# Patient Record
Sex: Male | Born: 1960 | Race: Black or African American | Hispanic: No | Marital: Single | Smoking: Current every day smoker
Health system: Southern US, Community
[De-identification: ages and names within clinical notes are randomized; demographics above are authoritative.]

## PROBLEM LIST (undated history)

## (undated) DIAGNOSIS — B20 Human immunodeficiency virus [HIV] disease: Secondary | ICD-10-CM

## (undated) DIAGNOSIS — B1920 Unspecified viral hepatitis C without hepatic coma: Secondary | ICD-10-CM

## (undated) DIAGNOSIS — E119 Type 2 diabetes mellitus without complications: Secondary | ICD-10-CM

## (undated) DIAGNOSIS — F209 Schizophrenia, unspecified: Secondary | ICD-10-CM

## (undated) DIAGNOSIS — F1910 Other psychoactive substance abuse, uncomplicated: Secondary | ICD-10-CM

## (undated) DIAGNOSIS — F200 Paranoid schizophrenia: Secondary | ICD-10-CM

## (undated) HISTORY — DX: Schizophrenia, unspecified: F20.9

## (undated) HISTORY — DX: Other psychoactive substance abuse, uncomplicated: F19.10

## (undated) HISTORY — DX: Human immunodeficiency virus (HIV) disease: B20

## (undated) HISTORY — DX: Unspecified viral hepatitis C without hepatic coma: B19.20

## (undated) HISTORY — DX: Type 2 diabetes mellitus without complications: E11.9

## (undated) MED ORDER — QUETIAPINE FUMARATE 400 MG OR TABS
ORAL_TABLET | ORAL | Status: AC
Start: 2015-10-29 — End: ?

## (undated) DEATH — deceased

---

## 2012-07-22 ENCOUNTER — Emergency Department (HOSPITAL_BASED_OUTPATIENT_CLINIC_OR_DEPARTMENT_OTHER)
Admission: EM | Admit: 2012-07-22 | Discharge: 2012-07-22 | Disposition: A | Discharge status: Home / Self Care | Payer: Medicaid Other | Attending: Nurse Practitioner | Admitting: Nurse Practitioner

## 2012-07-22 DIAGNOSIS — R21 Rash and other nonspecific skin eruption: Secondary | ICD-10-CM | POA: Insufficient documentation

## 2012-07-22 DIAGNOSIS — L299 Pruritus, unspecified: Secondary | ICD-10-CM | POA: Insufficient documentation

## 2012-08-03 ENCOUNTER — Other Ambulatory Visit (EMERGENCY_DEPARTMENT_HOSPITAL): Payer: Self-pay

## 2012-08-03 ENCOUNTER — Other Ambulatory Visit (HOSPITAL_COMMUNITY): Payer: Self-pay | Admitting: Internal Medicine

## 2012-08-03 ENCOUNTER — Other Ambulatory Visit (HOSPITAL_COMMUNITY): Payer: Self-pay

## 2012-08-03 ENCOUNTER — Other Ambulatory Visit (EMERGENCY_DEPARTMENT_HOSPITAL): Payer: Self-pay | Admitting: Internal Medicine

## 2012-08-03 ENCOUNTER — Inpatient Hospital Stay (HOSPITAL_BASED_OUTPATIENT_CLINIC_OR_DEPARTMENT_OTHER)
Admission: EM | Admit: 2012-08-03 | Discharge: 2012-08-06 | DRG: 714 | Disposition: A | Discharge status: Home / Self Care | Payer: No Typology Code available for payment source | Attending: Internal Medicine | Admitting: Internal Medicine

## 2012-08-03 ENCOUNTER — Inpatient Hospital Stay (HOSPITAL_COMMUNITY): Payer: No Typology Code available for payment source | Admitting: Internal Medicine

## 2012-08-03 DIAGNOSIS — J189 Pneumonia, unspecified organism: Principal | ICD-10-CM | POA: Diagnosis present

## 2012-08-03 DIAGNOSIS — E876 Hypokalemia: Secondary | ICD-10-CM | POA: Diagnosis present

## 2012-08-03 DIAGNOSIS — F209 Schizophrenia, unspecified: Secondary | ICD-10-CM | POA: Diagnosis present

## 2012-08-03 DIAGNOSIS — B370 Candidal stomatitis: Secondary | ICD-10-CM | POA: Diagnosis present

## 2012-08-03 DIAGNOSIS — B20 Human immunodeficiency virus [HIV] disease: Secondary | ICD-10-CM | POA: Diagnosis present

## 2012-08-03 DIAGNOSIS — B1920 Unspecified viral hepatitis C without hepatic coma: Secondary | ICD-10-CM | POA: Diagnosis present

## 2012-08-03 DIAGNOSIS — F17200 Nicotine dependence, unspecified, uncomplicated: Secondary | ICD-10-CM | POA: Diagnosis present

## 2012-08-03 DIAGNOSIS — Z5900 Homelessness unspecified: Secondary | ICD-10-CM

## 2012-08-03 LAB — LAB ADD ON ORDER

## 2012-08-03 LAB — CBC, DIFF
% Basophils: 0 %
% Eosinophils: 0 %
% Immature Granulocytes: 0 %
% Lymphocytes: 23 %
% Monocytes: 8 %
% Neutrophils: 69 %
Absolute Eosinophil Count: 0.01 10*3/uL (ref 0.00–0.50)
Absolute Lymphocyte Count: 1.91 10*3/uL (ref 1.00–4.80)
Basophils: 0.02 10*3/uL (ref 0.00–0.20)
Hematocrit: 34 % — ABNORMAL LOW (ref 38–50)
Hemoglobin: 11.5 g/dL — ABNORMAL LOW (ref 13.0–18.0)
Immature Granulocytes: 0.03 10*3/uL (ref 0.00–0.05)
MCH: 29.6 pg (ref 27.3–33.6)
MCHC: 34 g/dL (ref 32.2–36.5)
MCV: 87 fL (ref 81–98)
Monocytes: 0.64 10*3/uL (ref 0.00–0.80)
Neutrophils: 5.87 10*3/uL (ref 1.80–7.00)
Platelet Count: 179 10*3/uL (ref 150–400)
RBC: 3.88 mil/uL — ABNORMAL LOW (ref 4.40–5.60)
RDW-CV: 13.3 % (ref 11.6–14.4)
WBC: 8.48 10*3/uL (ref 4.30–10.00)

## 2012-08-03 LAB — HEPATIC FUNCTION PANEL
ALT (GPT): 20 U/L (ref 10–64)
AST (GOT): 29 U/L (ref 15–40)
Albumin: 2.4 g/dL — ABNORMAL LOW (ref 3.5–5.2)
Alkaline Phosphatase (Total): 63 U/L (ref 39–139)
Bilirubin (Direct): 0.2 mg/dL (ref 0.0–0.3)
Bilirubin (Total): 0.8 mg/dL (ref 0.2–1.3)
Protein (Total): 7.6 g/dL (ref 6.0–8.2)

## 2012-08-03 LAB — T CELL SUBSETS CD4/CD8 ONLY COUNTS
% CD4: 16 % — ABNORMAL LOW (ref 33–61)
% CD8: 66 % — ABNORMAL HIGH (ref 14–35)
Abs CD4 Lymphocyte Cnt: 0.251 10*3/uL — ABNORMAL LOW (ref 0.733–2.25)
Abs CD8 Lymphocyte Cnt: 1.046 10*3/uL (ref 0.25–1.24)
CD4/CD8 Ratio: 0.24

## 2012-08-03 LAB — MAGNESIUM: Magnesium: 1.7 mg/dL — ABNORMAL LOW (ref 1.8–2.4)

## 2012-08-03 LAB — BASIC METABOLIC PANEL
Anion Gap: 6 (ref 3–11)
Calcium: 8.2 mg/dL — ABNORMAL LOW (ref 8.9–10.2)
Carbon Dioxide, Total: 28 mEq/L (ref 22–32)
Chloride: 99 mEq/L (ref 98–108)
Creatinine: 0.73 mg/dL (ref 0.51–1.18)
GFR, Calc, African American: >60 mL/min (ref >59)
GFR, Calc, European American: >60 mL/min (ref >59)
Glucose: 92 mg/dL (ref 62–125)
Potassium: 3.1 mEq/L — ABNORMAL LOW (ref 3.7–5.2)
Sodium: 133 mEq/L — ABNORMAL LOW (ref 136–145)
Urea Nitrogen: 7 mg/dL — ABNORMAL LOW (ref 8–21)

## 2012-08-03 LAB — INFLUENZA A/B AND RSV BY PCR
2009 Swine H1N1 Influenza A: NOT DETECTED
Influenza A PCR Result: NOT DETECTED
Influenza B PCR Result: NOT DETECTED
RSV PCR Result: NOT DETECTED
Seasonal H3N2 Influenza A: NOT DETECTED

## 2012-08-03 LAB — PHOSPHATE: Phosphate: 2.1 mg/dL — ABNORMAL LOW (ref 2.5–4.5)

## 2012-08-03 LAB — ROUTINE INFLUENZA A/B & RSV

## 2012-08-03 LAB — 1ST EXTRA BLUE TOP

## 2012-08-03 LAB — L LACTATE, VENOUS WB (TO U/H LAB WITHIN 30 MIN): L Lactate (Direct), Venous Whole Blood: 1.1 mmol/L (ref 0.6–2.5)

## 2012-08-04 ENCOUNTER — Other Ambulatory Visit (HOSPITAL_COMMUNITY): Payer: Self-pay | Admitting: Internal Medicine

## 2012-08-04 LAB — PNEUMOCYSTIS STAIN: Direct Exam: NEGATIVE

## 2012-08-04 LAB — CBC (HEMOGRAM)
Hematocrit: 34 % — ABNORMAL LOW (ref 38–50)
Hemoglobin: 11.6 g/dL — ABNORMAL LOW (ref 13.0–18.0)
MCH: 29.8 pg (ref 27.3–33.6)
MCHC: 34.4 g/dL (ref 32.2–36.5)
MCV: 87 fL (ref 81–98)
Platelet Count: 205 10*3/uL (ref 150–400)
RBC: 3.89 mil/uL — ABNORMAL LOW (ref 4.40–5.60)
RDW-CV: 13.3 % (ref 11.6–14.4)
WBC: 4.71 10*3/uL (ref 4.30–10.00)

## 2012-08-04 LAB — MAGNESIUM: Magnesium: 1.8 mg/dL (ref 1.8–2.4)

## 2012-08-04 LAB — STREP. PNEUMONIAE AG, URN
Strep Pneumoniae Ag Commnt,URN: NEGATIVE
Strep Pneumoniae Ag Result,URN: NEGATIVE

## 2012-08-04 LAB — 1ST EXTRA MISC SPECIMEN

## 2012-08-05 LAB — RESPIRATORY VIRUS FA BTY: RSV FA: NEGATIVE — AB

## 2012-08-06 LAB — HEPATITIS A,B,& C PANEL
Hepatitis A Ab: REACTIVE
Hepatitis B Core Ab: REACTIVE — AB
Hepatitis B Surf Antibody Intl Units: 10.1 IU
Hepatitis B Surface Ab: REACTIVE
Hepatitis B Surface Antigen w/Reflex: NONREACTIVE
Hepatitis C Antibody w/Rflx PCR: REACTIVE — AB

## 2012-08-07 LAB — HIV1 RNA QUANTITATION
HIV RNA Copies/mL (Log10): 4.49
HIV RNA Result: 31200 Copies/mL — AB

## 2012-08-07 LAB — LOWER RESP C/S W/GRAM: Culture: NORMAL

## 2012-08-08 LAB — BLOOD C/S
Culture: NO GROWTH
Culture: NO GROWTH
Culture: NO GROWTH
Culture: NO GROWTH

## 2012-08-09 LAB — AMPLIFIED M. TUBERC. DIRECT: MTB Complex RRNA: NEGATIVE

## 2012-08-10 LAB — HEPATITIS C RNA, QUANT
Hcv RNA Iu/Ml (Log 10): 6
Hepatitis C Quant Result: 962400 IU/mL — AB

## 2012-08-15 LAB — HIV GENOTYPIC RESISTANCE ASSAY
Abacavir (Ziagen) ABC: 0
Atazanavir (Reyataz) ATV: 0
Darunavir (Prezista) DRV: 0
Didanosine (Videx) Ddi: 0
Efavirenz (Sustiva) EFV: 0
Emtricitabine (Emtriva) FTC: 0
Etravirine (Intelence) ETR: 0
Fosamprenavir (Lexiva) Fpv: 0
HIVGR, Protease Amino Acids: 2
HIVGR, RT: Amino Acids: 1
Indinavir (Crixivan) Idv: 0
Lamivudine (Epivir) 3TC: 0
Lopinavir-ritonavir (Kaletra) LPV: 0
Nelfinavir (Viracept) Nfv: 0
Nevirapine (Virammune) NVP: 0
Rilpivirine (Edurant) RPV: 0
Saquinavir (Invirase) Sqv: 0
Stavudine (Zerit) D4T: 0
Tenofovir (Viread) TDF/TAF: 0
Tipranavir (Aptivus) Tpv: 0
Zidovudine (Retrovir) ZDV/AZT: 0

## 2012-08-21 ENCOUNTER — Ambulatory Visit (HOSPITAL_BASED_OUTPATIENT_CLINIC_OR_DEPARTMENT_OTHER): Payer: No Typology Code available for payment source | Attending: Infectious Disease | Admitting: Infectious Disease

## 2012-08-21 DIAGNOSIS — B1920 Unspecified viral hepatitis C without hepatic coma: Secondary | ICD-10-CM | POA: Insufficient documentation

## 2012-08-21 DIAGNOSIS — F209 Schizophrenia, unspecified: Secondary | ICD-10-CM | POA: Insufficient documentation

## 2012-08-21 DIAGNOSIS — B20 Human immunodeficiency virus [HIV] disease: Secondary | ICD-10-CM | POA: Insufficient documentation

## 2012-09-01 LAB — LOWER RESP FUNGAL W/DIR. EXAM: Stain For Fungus: NONE SEEN

## 2012-09-03 ENCOUNTER — Ambulatory Visit: Payer: No Typology Code available for payment source

## 2012-09-11 ENCOUNTER — Ambulatory Visit: Payer: No Typology Code available for payment source

## 2012-09-12 ENCOUNTER — Other Ambulatory Visit (HOSPITAL_COMMUNITY): Payer: Self-pay | Admitting: Physician Assistant

## 2012-09-12 ENCOUNTER — Inpatient Hospital Stay (HOSPITAL_COMMUNITY): Payer: No Typology Code available for payment source | Admitting: Internal Medicine

## 2012-09-12 ENCOUNTER — Other Ambulatory Visit (EMERGENCY_DEPARTMENT_HOSPITAL): Payer: Self-pay | Admitting: Physician Assistant

## 2012-09-12 ENCOUNTER — Other Ambulatory Visit (EMERGENCY_DEPARTMENT_HOSPITAL): Payer: Self-pay

## 2012-09-12 ENCOUNTER — Inpatient Hospital Stay (HOSPITAL_BASED_OUTPATIENT_CLINIC_OR_DEPARTMENT_OTHER)
Admission: EM | Admit: 2012-09-12 | Discharge: 2012-09-15 | DRG: 714 | Disposition: A | Discharge status: Home / Self Care | Payer: No Typology Code available for payment source | Attending: Internal Medicine | Admitting: Internal Medicine

## 2012-09-12 DIAGNOSIS — E871 Hypo-osmolality and hyponatremia: Secondary | ICD-10-CM | POA: Diagnosis present

## 2012-09-12 DIAGNOSIS — Z5900 Homelessness unspecified: Secondary | ICD-10-CM

## 2012-09-12 DIAGNOSIS — F209 Schizophrenia, unspecified: Secondary | ICD-10-CM | POA: Diagnosis present

## 2012-09-12 DIAGNOSIS — D638 Anemia in other chronic diseases classified elsewhere: Secondary | ICD-10-CM | POA: Diagnosis present

## 2012-09-12 DIAGNOSIS — IMO0002 Reserved for concepts with insufficient information to code with codable children: Secondary | ICD-10-CM

## 2012-09-12 DIAGNOSIS — J189 Pneumonia, unspecified organism: Principal | ICD-10-CM | POA: Diagnosis present

## 2012-09-12 DIAGNOSIS — R197 Diarrhea, unspecified: Secondary | ICD-10-CM | POA: Diagnosis present

## 2012-09-12 DIAGNOSIS — B182 Chronic viral hepatitis C: Secondary | ICD-10-CM | POA: Diagnosis present

## 2012-09-12 DIAGNOSIS — B20 Human immunodeficiency virus [HIV] disease: Secondary | ICD-10-CM | POA: Diagnosis present

## 2012-09-12 DIAGNOSIS — F17200 Nicotine dependence, unspecified, uncomplicated: Secondary | ICD-10-CM | POA: Diagnosis present

## 2012-09-12 DIAGNOSIS — Z66 Do not resuscitate: Secondary | ICD-10-CM | POA: Diagnosis present

## 2012-09-12 LAB — CBC, DIFF
% Basophils: 1 %
% Eosinophils: 1 %
% Immature Granulocytes: 1 %
% Lymphocytes: 30 %
% Monocytes: 17 %
% Neutrophils: 50 %
Absolute Eosinophil Count: 0.08 10*3/uL (ref 0.00–0.50)
Absolute Lymphocyte Count: 1.68 10*3/uL (ref 1.00–4.80)
Basophils: 0.03 10*3/uL (ref 0.00–0.20)
Hematocrit: 33 % — ABNORMAL LOW (ref 38–50)
Hemoglobin: 11.3 g/dL — ABNORMAL LOW (ref 13.0–18.0)
Immature Granulocytes: 0.03 10*3/uL (ref 0.00–0.05)
MCH: 29.7 pg (ref 27.3–33.6)
MCHC: 34.5 g/dL (ref 32.2–36.5)
MCV: 86 fL (ref 81–98)
Monocytes: 0.93 10*3/uL — ABNORMAL HIGH (ref 0.00–0.80)
Neutrophils: 2.85 10*3/uL (ref 1.80–7.00)
Platelet Count: 235 10*3/uL (ref 150–400)
RBC: 3.81 mil/uL — ABNORMAL LOW (ref 4.40–5.60)
RDW-CV: 14.7 % — ABNORMAL HIGH (ref 11.6–14.4)
WBC: 5.6 10*3/uL (ref 4.30–10.00)

## 2012-09-12 LAB — INFLUENZA A/B AND RSV BY PCR
2009 Swine H1N1 Influenza A: NOT DETECTED
Influenza A PCR Result: NOT DETECTED
Influenza B PCR Result: NOT DETECTED
RSV PCR Result: NOT DETECTED
Seasonal H3N2 Influenza A: NOT DETECTED

## 2012-09-12 LAB — ROUTINE INFLUENZA A/B & RSV

## 2012-09-12 LAB — BASIC METABOLIC PANEL
Anion Gap: 9 (ref 3–11)
Calcium: 8.6 mg/dL — ABNORMAL LOW (ref 8.9–10.2)
Carbon Dioxide, Total: 24 mEq/L (ref 22–32)
Chloride: 97 mEq/L — ABNORMAL LOW (ref 98–108)
Creatinine: 0.87 mg/dL (ref 0.51–1.18)
GFR, Calc, African American: >60 mL/min (ref >59)
GFR, Calc, European American: >60 mL/min (ref >59)
Glucose: 85 mg/dL (ref 62–125)
Potassium: 3.8 mEq/L (ref 3.7–5.2)
Sodium: 130 mEq/L — ABNORMAL LOW (ref 136–145)
Urea Nitrogen: 10 mg/dL (ref 8–21)

## 2012-09-12 LAB — LAB ADD ON ORDER

## 2012-09-12 LAB — LACTATE DEHYDROGENASE: Lactate Dehydrogenase: 171 U/L (ref 80–190)

## 2012-09-12 LAB — 1ST EXTRA BLUE TOP

## 2012-09-12 LAB — L LACTATE, VENOUS WB (TO U/H LAB WITHIN 30 MIN): L Lactate (Direct), Venous Whole Blood: 0.9 mmol/L (ref 0.6–2.5)

## 2012-09-13 ENCOUNTER — Other Ambulatory Visit (HOSPITAL_COMMUNITY): Payer: Self-pay | Admitting: Neuromuscular Medicine

## 2012-09-13 LAB — R/O MRSA

## 2012-09-13 LAB — BASIC METABOLIC PANEL
Anion Gap: 9 (ref 3–11)
Calcium: 8.7 mg/dL — ABNORMAL LOW (ref 8.9–10.2)
Carbon Dioxide, Total: 25 mEq/L (ref 22–32)
Chloride: 104 mEq/L (ref 98–108)
Creatinine: 0.59 mg/dL (ref 0.51–1.18)
GFR, Calc, African American: >60 mL/min (ref >59)
GFR, Calc, European American: >60 mL/min (ref >59)
Glucose: 97 mg/dL (ref 62–125)
Potassium: 3.9 mEq/L (ref 3.7–5.2)
Sodium: 138 mEq/L (ref 136–145)
Urea Nitrogen: 3 mg/dL — ABNORMAL LOW (ref 8–21)

## 2012-09-13 LAB — CBC (HEMOGRAM)
Hematocrit: 35 % — ABNORMAL LOW (ref 38–50)
Hemoglobin: 11.9 g/dL — ABNORMAL LOW (ref 13.0–18.0)
MCH: 29.7 pg (ref 27.3–33.6)
MCHC: 34.4 g/dL (ref 32.2–36.5)
MCV: 86 fL (ref 81–98)
Platelet Count: 276 10*3/uL (ref 150–400)
RBC: 4.01 mil/uL — ABNORMAL LOW (ref 4.40–5.60)
RDW-CV: 14.7 % — ABNORMAL HIGH (ref 11.6–14.4)
WBC: 4.31 10*3/uL (ref 4.30–10.00)

## 2012-09-13 LAB — C. DIFFICILE TOXIN GENE BY PCR: C. difficile Toxin Gene by PCR: NEGATIVE

## 2012-09-13 NOTE — Progress Notes (Signed)
09/11/2012    Subjective Findings: Pt comes in stating that he has increased sinus congestion increased SOB had PNA last month cough, slept DESC PCP [facility]    Objective Findings: BP 109/67  Pulse 115  Temp(Src) 102 F (38.9 C) (Temporal)  Resp 20  SpO2 97%    A&O x 3 skin warm and dry color WNL  TM's WNL some wax present do c/o of pain or drainage noted, throat WNL lungs CTA.     Assessment potential for increased URI    Plan: pt not wanting to return to ER at this time Provided with Tylenol denied day rest. Requested he return about four for follow up on temp. Pt states agreement.     Follow up 1615  Temp 37.2 agrees to return to clinic or go to [facility] in AM.         Allean Found, RN

## 2012-09-14 ENCOUNTER — Other Ambulatory Visit (HOSPITAL_COMMUNITY): Payer: Self-pay | Admitting: Neuromuscular Medicine

## 2012-09-14 ENCOUNTER — Encounter (HOSPITAL_BASED_OUTPATIENT_CLINIC_OR_DEPARTMENT_OTHER): Payer: No Typology Code available for payment source | Admitting: Psychiatry

## 2012-09-14 ENCOUNTER — Other Ambulatory Visit (HOSPITAL_COMMUNITY): Payer: Self-pay | Admitting: Internal Medicine

## 2012-09-14 LAB — LAB ORDER, MICROBIOLOGY

## 2012-09-14 LAB — VANCOMYCIN, TROUGH LEVEL: Vancomycin, Trough Level: <3.5 ug/mL — ABNORMAL LOW (ref 5.0–20.0)

## 2012-09-15 ENCOUNTER — Other Ambulatory Visit (HOSPITAL_COMMUNITY): Payer: Self-pay | Admitting: Neuromuscular Medicine

## 2012-09-15 LAB — LAB ORDER, MICROBIOLOGY

## 2012-09-15 LAB — MICROBIOLOGY ORDER

## 2012-09-15 LAB — PNEUMOCYSTIS STAIN: Direct Exam: NEGATIVE

## 2012-09-16 LAB — LOWER RESP C/S W/GRAM: Culture: NORMAL

## 2012-09-16 LAB — STOOL C/S AND ENTERIC BATTERY: Culture: NORMAL

## 2012-09-17 LAB — BLOOD C/S
Culture: NO GROWTH
Culture: NO GROWTH

## 2012-09-18 LAB — EXTENDED STOOL PARASITE MICROSCOPIC EXAM
Exam: NONE SEEN
Exam: NONE SEEN
Special Requests: NOT DETECTED

## 2012-09-19 ENCOUNTER — Ambulatory Visit (HOSPITAL_BASED_OUTPATIENT_CLINIC_OR_DEPARTMENT_OTHER): Payer: No Typology Code available for payment source | Attending: Internal Medicine | Admitting: Internal Medicine

## 2012-09-19 ENCOUNTER — Other Ambulatory Visit (HOSPITAL_BASED_OUTPATIENT_CLINIC_OR_DEPARTMENT_OTHER): Payer: Self-pay

## 2012-09-19 DIAGNOSIS — J189 Pneumonia, unspecified organism: Secondary | ICD-10-CM | POA: Insufficient documentation

## 2012-09-19 DIAGNOSIS — B20 Human immunodeficiency virus [HIV] disease: Secondary | ICD-10-CM | POA: Insufficient documentation

## 2012-09-19 DIAGNOSIS — F209 Schizophrenia, unspecified: Secondary | ICD-10-CM | POA: Insufficient documentation

## 2012-09-20 LAB — LEGIONELLA AG, URN: Legionella Ag, URN (Sendout): NEGATIVE

## 2012-09-21 LAB — AMPLIFIED M. TUBERC. DIRECT: MTB Complex RRNA: NEGATIVE

## 2012-09-21 LAB — HIV1 RNA QUANTITATION
HIV RNA Copies/mL (Log10): 2.14
HIV RNA Result: 139 Copies/mL — AB

## 2012-09-25 ENCOUNTER — Ambulatory Visit (HOSPITAL_BASED_OUTPATIENT_CLINIC_OR_DEPARTMENT_OTHER): Payer: No Typology Code available for payment source | Attending: Infectious Disease | Admitting: Infectious Disease

## 2012-09-25 DIAGNOSIS — Z79899 Other long term (current) drug therapy: Secondary | ICD-10-CM | POA: Insufficient documentation

## 2012-09-25 DIAGNOSIS — B20 Human immunodeficiency virus [HIV] disease: Secondary | ICD-10-CM | POA: Insufficient documentation

## 2012-09-25 DIAGNOSIS — B1920 Unspecified viral hepatitis C without hepatic coma: Secondary | ICD-10-CM | POA: Insufficient documentation

## 2012-09-25 DIAGNOSIS — R61 Generalized hyperhidrosis: Secondary | ICD-10-CM | POA: Insufficient documentation

## 2012-09-25 DIAGNOSIS — F209 Schizophrenia, unspecified: Secondary | ICD-10-CM | POA: Insufficient documentation

## 2012-10-01 LAB — AFB CULTURE W/STAIN
AFB Stain: NONE SEEN
AFB Stain: NONE SEEN
AFB Stain: NONE SEEN

## 2012-10-03 ENCOUNTER — Other Ambulatory Visit (HOSPITAL_COMMUNITY): Payer: Self-pay | Admitting: Unknown Physician Specialty

## 2012-10-03 ENCOUNTER — Other Ambulatory Visit (HOSPITAL_COMMUNITY): Payer: Self-pay | Admitting: Emergency Medicine

## 2012-10-03 ENCOUNTER — Other Ambulatory Visit (EMERGENCY_DEPARTMENT_HOSPITAL): Payer: Self-pay

## 2012-10-03 ENCOUNTER — Other Ambulatory Visit (EMERGENCY_DEPARTMENT_HOSPITAL): Payer: Self-pay | Admitting: Emergency Medicine

## 2012-10-03 ENCOUNTER — Inpatient Hospital Stay (HOSPITAL_BASED_OUTPATIENT_CLINIC_OR_DEPARTMENT_OTHER)
Admission: EM | Admit: 2012-10-03 | Discharge: 2012-10-11 | DRG: 714 | Disposition: A | Discharge status: Home / Self Care | Payer: No Typology Code available for payment source | Attending: Internal Medicine | Admitting: Internal Medicine

## 2012-10-03 ENCOUNTER — Inpatient Hospital Stay (HOSPITAL_COMMUNITY): Payer: No Typology Code available for payment source | Admitting: Internal Medicine

## 2012-10-03 DIAGNOSIS — Z66 Do not resuscitate: Secondary | ICD-10-CM | POA: Diagnosis present

## 2012-10-03 DIAGNOSIS — B20 Human immunodeficiency virus [HIV] disease: Secondary | ICD-10-CM | POA: Diagnosis present

## 2012-10-03 DIAGNOSIS — IMO0002 Reserved for concepts with insufficient information to code with codable children: Secondary | ICD-10-CM

## 2012-10-03 DIAGNOSIS — Z8611 Personal history of tuberculosis: Secondary | ICD-10-CM

## 2012-10-03 DIAGNOSIS — F17200 Nicotine dependence, unspecified, uncomplicated: Secondary | ICD-10-CM | POA: Diagnosis present

## 2012-10-03 DIAGNOSIS — N170 Acute kidney failure with tubular necrosis: Secondary | ICD-10-CM | POA: Diagnosis not present

## 2012-10-03 DIAGNOSIS — Z5900 Homelessness unspecified: Secondary | ICD-10-CM

## 2012-10-03 DIAGNOSIS — J984 Other disorders of lung: Secondary | ICD-10-CM | POA: Diagnosis present

## 2012-10-03 DIAGNOSIS — T50995A Adverse effect of other drugs, medicaments and biological substances, initial encounter: Secondary | ICD-10-CM | POA: Diagnosis not present

## 2012-10-03 DIAGNOSIS — F209 Schizophrenia, unspecified: Secondary | ICD-10-CM | POA: Diagnosis present

## 2012-10-03 DIAGNOSIS — B1920 Unspecified viral hepatitis C without hepatic coma: Secondary | ICD-10-CM | POA: Diagnosis present

## 2012-10-03 DIAGNOSIS — R6510 Systemic inflammatory response syndrome (SIRS) of non-infectious origin without acute organ dysfunction: Secondary | ICD-10-CM | POA: Diagnosis present

## 2012-10-03 DIAGNOSIS — Z9001 Acquired absence of eye: Secondary | ICD-10-CM

## 2012-10-03 DIAGNOSIS — J159 Unspecified bacterial pneumonia: Principal | ICD-10-CM | POA: Diagnosis present

## 2012-10-03 DIAGNOSIS — R071 Chest pain on breathing: Secondary | ICD-10-CM | POA: Diagnosis present

## 2012-10-03 DIAGNOSIS — E871 Hypo-osmolality and hyponatremia: Secondary | ICD-10-CM | POA: Diagnosis present

## 2012-10-03 LAB — COMPREHENSIVE METABOLIC PANEL
ALT (GPT): 29 U/L (ref 10–48)
AST (GOT): 51 U/L — ABNORMAL HIGH (ref 15–40)
Albumin: 3 g/dL — ABNORMAL LOW (ref 3.5–5.2)
Alkaline Phosphatase (Total): 93 U/L (ref 39–139)
Anion Gap: 9 (ref 3–11)
Bilirubin (Total): 2.9 mg/dL — ABNORMAL HIGH (ref 0.2–1.3)
Calcium: 9 mg/dL (ref 8.9–10.2)
Carbon Dioxide, Total: 27 mEq/L (ref 22–32)
Chloride: 96 mEq/L — ABNORMAL LOW (ref 98–108)
Creatinine: 1 mg/dL (ref 0.51–1.18)
GFR, Calc, African American: >60 mL/min (ref >59)
GFR, Calc, European American: >60 mL/min (ref >59)
Glucose: 202 mg/dL — ABNORMAL HIGH (ref 62–125)
Potassium: 3.8 mEq/L (ref 3.7–5.2)
Protein (Total): 7.6 g/dL (ref 6.0–8.2)
Sodium: 132 mEq/L — ABNORMAL LOW (ref 136–145)
Urea Nitrogen: 9 mg/dL (ref 8–21)

## 2012-10-03 LAB — PROTHROMBIN & PTT
Partial Thromboplastin Time: 32 s (ref 22–35)
Prothrombin INR: 1.4 — ABNORMAL HIGH (ref 0.8–1.3)
Prothrombin Time Patient: 17.1 s — ABNORMAL HIGH (ref 10.7–15.6)

## 2012-10-03 LAB — CBC (HEMOGRAM)
Hematocrit: 36 % — ABNORMAL LOW (ref 38–50)
Hemoglobin: 11.8 g/dL — ABNORMAL LOW (ref 13.0–18.0)
MCH: 29.4 pg (ref 27.3–33.6)
MCHC: 33.1 g/dL (ref 32.2–36.5)
MCV: 89 fL (ref 81–98)
Platelet Count: 184 10*3/uL (ref 150–400)
RBC: 4.01 mil/uL — ABNORMAL LOW (ref 4.40–5.60)
RDW-CV: 15.2 % — ABNORMAL HIGH (ref 11.6–14.4)
WBC: 10.83 10*3/uL — ABNORMAL HIGH (ref 4.30–10.00)

## 2012-10-03 LAB — CHEST PAIN REFLEXIVE TESTING
Troponin_I Interpretation: NORMAL
Troponin_I: <0.03 ng/mL (ref <0.04)

## 2012-10-03 LAB — L LACTATE, VENOUS WB (TO U/H LAB WITHIN 30 MIN)
L Lactate (Direct), Venous Whole Blood: 2.8 mmol/L — ABNORMAL HIGH (ref 0.6–2.5)
L Lactate (Direct), Venous Whole Blood: 0.6 mmol/L (ref 0.6–2.5)

## 2012-10-03 LAB — 1ST EXTRA RED TOP

## 2012-10-04 ENCOUNTER — Other Ambulatory Visit (HOSPITAL_COMMUNITY): Payer: Self-pay | Admitting: Internal Medicine

## 2012-10-04 ENCOUNTER — Other Ambulatory Visit (HOSPITAL_COMMUNITY): Payer: Self-pay | Admitting: Unknown Physician Specialty

## 2012-10-04 ENCOUNTER — Other Ambulatory Visit (HOSPITAL_COMMUNITY): Payer: Self-pay | Admitting: Anesthesiology

## 2012-10-04 LAB — BASIC METABOLIC PANEL
Anion Gap: 9 (ref 3–11)
Calcium: 8.8 mg/dL — ABNORMAL LOW (ref 8.9–10.2)
Carbon Dioxide, Total: 25 mEq/L (ref 22–32)
Chloride: 105 mEq/L (ref 98–108)
Creatinine: 0.74 mg/dL (ref 0.51–1.18)
GFR, Calc, African American: >60 mL/min (ref >59)
GFR, Calc, European American: >60 mL/min (ref >59)
Glucose: 86 mg/dL (ref 62–125)
Potassium: 3.7 mEq/L (ref 3.7–5.2)
Sodium: 139 mEq/L (ref 136–145)
Urea Nitrogen: 5 mg/dL — ABNORMAL LOW (ref 8–21)

## 2012-10-04 LAB — VANCOMYCIN, TROUGH LEVEL: Vancomycin, Trough Level: 7 ug/mL (ref 5.0–20.0)

## 2012-10-04 LAB — CBC (HEMOGRAM)
Hematocrit: 35 % — ABNORMAL LOW (ref 38–50)
Hemoglobin: 11.7 g/dL — ABNORMAL LOW (ref 13.0–18.0)
MCH: 29.3 pg (ref 27.3–33.6)
MCHC: 33.3 g/dL (ref 32.2–36.5)
MCV: 88 fL (ref 81–98)
Platelet Count: 169 10*3/uL (ref 150–400)
RBC: 4 mil/uL — ABNORMAL LOW (ref 4.40–5.60)
RDW-CV: 15.2 % — ABNORMAL HIGH (ref 11.6–14.4)
WBC: 8.11 10*3/uL (ref 4.30–10.00)

## 2012-10-04 LAB — HEPATIC FUNCTION PANEL
ALT (GPT): 26 U/L (ref 10–48)
AST (GOT): 44 U/L — ABNORMAL HIGH (ref 15–40)
Albumin: 2.5 g/dL — ABNORMAL LOW (ref 3.5–5.2)
Alkaline Phosphatase (Total): 73 U/L (ref 39–139)
Bilirubin (Direct): 0.3 mg/dL (ref 0.0–0.3)
Bilirubin (Total): 1.5 mg/dL — ABNORMAL HIGH (ref 0.2–1.3)
Protein (Total): 6.8 g/dL (ref 6.0–8.2)

## 2012-10-04 LAB — GLUCOSE POC, HMC: Glucose (POC): 95 mg/dL (ref 62–125)

## 2012-10-04 LAB — PROTHROMBIN TIME
Prothrombin INR: 1.5 — ABNORMAL HIGH (ref 0.8–1.3)
Prothrombin Time Patient: 17.7 s — ABNORMAL HIGH (ref 10.7–15.6)

## 2012-10-05 ENCOUNTER — Other Ambulatory Visit (HOSPITAL_COMMUNITY): Payer: Self-pay | Admitting: Unknown Physician Specialty

## 2012-10-05 LAB — CBC (HEMOGRAM)
Hematocrit: 35 % — ABNORMAL LOW (ref 38–50)
Hemoglobin: 11.8 g/dL — ABNORMAL LOW (ref 13.0–18.0)
MCH: 29 pg (ref 27.3–33.6)
MCHC: 33.3 g/dL (ref 32.2–36.5)
MCV: 87 fL (ref 81–98)
Platelet Count: 197 10*3/uL (ref 150–400)
RBC: 4.07 mil/uL — ABNORMAL LOW (ref 4.40–5.60)
RDW-CV: 15 % — ABNORMAL HIGH (ref 11.6–14.4)
WBC: 5.82 10*3/uL (ref 4.30–10.00)

## 2012-10-05 LAB — BASIC METABOLIC PANEL
Anion Gap: 7 (ref 3–11)
Calcium: 8.7 mg/dL — ABNORMAL LOW (ref 8.9–10.2)
Carbon Dioxide, Total: 25 mEq/L (ref 22–32)
Chloride: 106 mEq/L (ref 98–108)
Creatinine: 0.65 mg/dL (ref 0.51–1.18)
GFR, Calc, African American: >60 mL/min (ref >59)
GFR, Calc, European American: >60 mL/min (ref >59)
Glucose: 96 mg/dL (ref 62–125)
Potassium: 3.8 mEq/L (ref 3.7–5.2)
Sodium: 138 mEq/L (ref 136–145)
Urea Nitrogen: 6 mg/dL — ABNORMAL LOW (ref 8–21)

## 2012-10-05 LAB — HEPATIC FUNCTION PANEL
ALT (GPT): 30 U/L (ref 10–48)
AST (GOT): 41 U/L — ABNORMAL HIGH (ref 15–40)
Albumin: 2.5 g/dL — ABNORMAL LOW (ref 3.5–5.2)
Alkaline Phosphatase (Total): 70 U/L (ref 39–139)
Bilirubin (Direct): 0.2 mg/dL (ref 0.0–0.3)
Bilirubin (Total): 1.4 mg/dL — ABNORMAL HIGH (ref 0.2–1.3)
Protein (Total): 7.1 g/dL (ref 6.0–8.2)

## 2012-10-05 LAB — PROTHROMBIN TIME
Prothrombin INR: 1.3 (ref 0.8–1.3)
Prothrombin Time Patient: 16.4 s — ABNORMAL HIGH (ref 10.7–15.6)

## 2012-10-06 ENCOUNTER — Other Ambulatory Visit (HOSPITAL_COMMUNITY): Payer: Self-pay | Admitting: Unknown Physician Specialty

## 2012-10-06 LAB — CBC (HEMOGRAM)
Hematocrit: 36 % — ABNORMAL LOW (ref 38–50)
Hemoglobin: 12 g/dL — ABNORMAL LOW (ref 13.0–18.0)
MCH: 29.1 pg (ref 27.3–33.6)
MCHC: 33.4 g/dL (ref 32.2–36.5)
MCV: 87 fL (ref 81–98)
Platelet Count: 228 10*3/uL (ref 150–400)
RBC: 4.13 mil/uL — ABNORMAL LOW (ref 4.40–5.60)
RDW-CV: 15 % — ABNORMAL HIGH (ref 11.6–14.4)
WBC: 4.58 10*3/uL (ref 4.30–10.00)

## 2012-10-06 LAB — BASIC METABOLIC PANEL
Anion Gap: 8 (ref 3–11)
Calcium: 9 mg/dL (ref 8.9–10.2)
Carbon Dioxide, Total: 25 mEq/L (ref 22–32)
Chloride: 104 mEq/L (ref 98–108)
Creatinine: 0.71 mg/dL (ref 0.51–1.18)
GFR, Calc, African American: >60 mL/min (ref >59)
GFR, Calc, European American: >60 mL/min (ref >59)
Glucose: 97 mg/dL (ref 62–125)
Potassium: 4.2 mEq/L (ref 3.7–5.2)
Sodium: 137 mEq/L (ref 136–145)
Urea Nitrogen: 5 mg/dL — ABNORMAL LOW (ref 8–21)

## 2012-10-06 LAB — LOWER RESP C/S W/GRAM: Culture: NORMAL

## 2012-10-06 LAB — VANCOMYCIN, TROUGH LEVEL: Vancomycin, Trough Level: 12.8 ug/mL (ref 5.0–20.0)

## 2012-10-07 ENCOUNTER — Other Ambulatory Visit (HOSPITAL_COMMUNITY): Payer: Self-pay | Admitting: Unknown Physician Specialty

## 2012-10-07 LAB — CBC (HEMOGRAM)
Hematocrit: 36 % — ABNORMAL LOW (ref 38–50)
Hemoglobin: 12.2 g/dL — ABNORMAL LOW (ref 13.0–18.0)
MCH: 29.2 pg (ref 27.3–33.6)
MCHC: 33.7 g/dL (ref 32.2–36.5)
MCV: 87 fL (ref 81–98)
Platelet Count: 238 10*3/uL (ref 150–400)
RBC: 4.18 mil/uL — ABNORMAL LOW (ref 4.40–5.60)
RDW-CV: 14.9 % — ABNORMAL HIGH (ref 11.6–14.4)
WBC: 4.69 10*3/uL (ref 4.30–10.00)

## 2012-10-07 LAB — BASIC METABOLIC PANEL
Anion Gap: 4 (ref 3–11)
Calcium: 9.1 mg/dL (ref 8.9–10.2)
Carbon Dioxide, Total: 28 mEq/L (ref 22–32)
Chloride: 105 mEq/L (ref 98–108)
Creatinine: 0.7 mg/dL (ref 0.51–1.18)
GFR, Calc, African American: >60 mL/min (ref >59)
GFR, Calc, European American: >60 mL/min (ref >59)
Glucose: 98 mg/dL (ref 62–125)
Potassium: 4.5 mEq/L (ref 3.7–5.2)
Sodium: 137 mEq/L (ref 136–145)
Urea Nitrogen: 6 mg/dL — ABNORMAL LOW (ref 8–21)

## 2012-10-07 LAB — VANCOMYCIN, TROUGH LEVEL: Vancomycin, Trough Level: 27.3 ug/mL — ABNORMAL HIGH (ref 5.0–20.0)

## 2012-10-08 ENCOUNTER — Other Ambulatory Visit (HOSPITAL_COMMUNITY): Payer: Self-pay | Admitting: Anesthesiology

## 2012-10-08 ENCOUNTER — Other Ambulatory Visit (HOSPITAL_COMMUNITY): Payer: Self-pay | Admitting: Unknown Physician Specialty

## 2012-10-08 LAB — BASIC METABOLIC PANEL
Anion Gap: 7 (ref 3–11)
Anion Gap: 7 (ref 3–11)
Calcium: 9.1 mg/dL (ref 8.9–10.2)
Calcium: 9 mg/dL (ref 8.9–10.2)
Carbon Dioxide, Total: 24 mEq/L (ref 22–32)
Carbon Dioxide, Total: 23 mEq/L (ref 22–32)
Chloride: 103 mEq/L (ref 98–108)
Chloride: 105 mEq/L (ref 98–108)
Creatinine: 2.81 mg/dL — ABNORMAL HIGH (ref 0.51–1.18)
Creatinine: 2.97 mg/dL — ABNORMAL HIGH (ref 0.51–1.18)
GFR, Calc, African American: 29 mL/min — ABNORMAL LOW (ref >59)
GFR, Calc, African American: 27 mL/min — ABNORMAL LOW (ref >59)
GFR, Calc, European American: 24 mL/min — ABNORMAL LOW (ref >59)
GFR, Calc, European American: 23 mL/min — ABNORMAL LOW (ref >59)
Glucose: 89 mg/dL (ref 62–125)
Glucose: 77 mg/dL (ref 62–125)
Potassium: 4.3 mEq/L (ref 3.7–5.2)
Potassium: 5 mEq/L (ref 3.7–5.2)
Sodium: 134 mEq/L — ABNORMAL LOW (ref 136–145)
Sodium: 135 mEq/L — ABNORMAL LOW (ref 136–145)
Urea Nitrogen: 17 mg/dL (ref 8–21)
Urea Nitrogen: 17 mg/dL (ref 8–21)

## 2012-10-08 LAB — SODIUM, URINE: Sodium, URN: 54 mEq/L

## 2012-10-08 LAB — URINALYSIS COMPLETE, URN
Bacteria, URN: NONE SEEN
Bilirubin (Qual), URN: NEGATIVE
Epith Cells_Renal/Trans,URN: NEGATIVE /HPF
Epith Cells_Squamous, URN: NEGATIVE /LPF
Glucose Qual, URN: NEGATIVE mg/dL
Ketones, URN: NEGATIVE mg/dL
Leukocyte Esterase, URN: NEGATIVE
Nitrite, URN: NEGATIVE
Occult Blood, URN: NEGATIVE
Protein (Alb Semiquant), URN: NEGATIVE mg/dL
RBC, URN: NEGATIVE /HPF
Specific Gravity, URN: 1.004 g/mL (ref 1.002–1.027)
WBC, URN: NEGATIVE /HPF
pH, URN: 5.5 (ref 5.0–8.0)

## 2012-10-08 LAB — BLOOD C/S
Culture: NO GROWTH
Culture: NO GROWTH

## 2012-10-08 LAB — CBC (HEMOGRAM)
Hematocrit: 37 % — ABNORMAL LOW (ref 38–50)
Hemoglobin: 12.5 g/dL — ABNORMAL LOW (ref 13.0–18.0)
MCH: 29 pg (ref 27.3–33.6)
MCHC: 34 g/dL (ref 32.2–36.5)
MCV: 85 fL (ref 81–98)
Platelet Count: 236 10*3/uL (ref 150–400)
RBC: 4.31 mil/uL — ABNORMAL LOW (ref 4.40–5.60)
RDW-CV: 14.6 % — ABNORMAL HIGH (ref 11.6–14.4)
WBC: 7.74 10*3/uL (ref 4.30–10.00)

## 2012-10-08 LAB — CREATININE, URINE: Creatinine/Unit, Urine: 31 mg/dL

## 2012-10-09 ENCOUNTER — Other Ambulatory Visit (HOSPITAL_COMMUNITY): Payer: Self-pay | Admitting: Unknown Physician Specialty

## 2012-10-09 ENCOUNTER — Other Ambulatory Visit (HOSPITAL_COMMUNITY): Payer: Self-pay

## 2012-10-09 LAB — BASIC METABOLIC PANEL
Anion Gap: 7 (ref 3–11)
Anion Gap: 9 (ref 3–11)
Calcium: 8.8 mg/dL — ABNORMAL LOW (ref 8.9–10.2)
Calcium: 8.6 mg/dL — ABNORMAL LOW (ref 8.9–10.2)
Carbon Dioxide, Total: 24 mEq/L (ref 22–32)
Carbon Dioxide, Total: 23 mEq/L (ref 22–32)
Chloride: 106 mEq/L (ref 98–108)
Chloride: 98 mEq/L (ref 98–108)
Creatinine: 3.17 mg/dL — ABNORMAL HIGH (ref 0.51–1.18)
Creatinine: 3.2 mg/dL — ABNORMAL HIGH (ref 0.51–1.18)
GFR, Calc, African American: 25 mL/min — ABNORMAL LOW (ref >59)
GFR, Calc, African American: 25 mL/min — ABNORMAL LOW (ref >59)
GFR, Calc, European American: 21 mL/min — ABNORMAL LOW (ref >59)
GFR, Calc, European American: 21 mL/min — ABNORMAL LOW (ref >59)
Glucose: 86 mg/dL (ref 62–125)
Glucose: 162 mg/dL — ABNORMAL HIGH (ref 62–125)
Potassium: 5.1 mEq/L (ref 3.7–5.2)
Potassium: 4.5 mEq/L (ref 3.7–5.2)
Sodium: 137 mEq/L (ref 136–145)
Sodium: 130 mEq/L — ABNORMAL LOW (ref 136–145)
Urea Nitrogen: 20 mg/dL (ref 8–21)
Urea Nitrogen: 21 mg/dL (ref 8–21)

## 2012-10-09 LAB — URINALYSIS COMPLETE, URN
Bilirubin (Qual), URN: NEGATIVE
Epith Cells_Renal/Trans,URN: NEGATIVE /HPF
Epith Cells_Squamous, URN: NEGATIVE /LPF
Glucose Qual, URN: NEGATIVE mg/dL
Ketones, URN: NEGATIVE mg/dL
Leukocyte Esterase, URN: NEGATIVE
Nitrite, URN: NEGATIVE
Occult Blood, URN: NEGATIVE
RBC, URN: NEGATIVE /HPF
Specific Gravity, URN: 1.005 g/mL (ref 1.002–1.027)
WBC, URN: NEGATIVE /HPF
pH, URN: 6.5 (ref 5.0–8.0)

## 2012-10-09 LAB — CBC (HEMOGRAM)
Hematocrit: 38 % (ref 38–50)
Hemoglobin: 12.7 g/dL — ABNORMAL LOW (ref 13.0–18.0)
MCH: 29 pg (ref 27.3–33.6)
MCHC: 33.8 g/dL (ref 32.2–36.5)
MCV: 86 fL (ref 81–98)
Platelet Count: 226 10*3/uL (ref 150–400)
RBC: 4.38 mil/uL — ABNORMAL LOW (ref 4.40–5.60)
RDW-CV: 15.1 % — ABNORMAL HIGH (ref 11.6–14.4)
WBC: 8.1 10*3/uL (ref 4.30–10.00)

## 2012-10-09 LAB — EOSINOPHILS, URINE
Eosinophils, URN: 0 %
Eosinophils, URN: 200

## 2012-10-10 ENCOUNTER — Other Ambulatory Visit (HOSPITAL_COMMUNITY): Payer: Self-pay | Admitting: Unknown Physician Specialty

## 2012-10-10 LAB — BASIC METABOLIC PANEL
Anion Gap: 5 (ref 3–11)
Anion Gap: 8 (ref 3–11)
Calcium: 8.9 mg/dL (ref 8.9–10.2)
Calcium: 9 mg/dL (ref 8.9–10.2)
Carbon Dioxide, Total: 23 mEq/L (ref 22–32)
Carbon Dioxide, Total: 23 mEq/L (ref 22–32)
Chloride: 108 mEq/L (ref 98–108)
Chloride: 105 mEq/L (ref 98–108)
Creatinine: 2.91 mg/dL — ABNORMAL HIGH (ref 0.51–1.18)
Creatinine: 3.02 mg/dL — ABNORMAL HIGH (ref 0.51–1.18)
GFR, Calc, African American: 28 mL/min — ABNORMAL LOW (ref >59)
GFR, Calc, African American: 27 mL/min — ABNORMAL LOW (ref >59)
GFR, Calc, European American: 23 mL/min — ABNORMAL LOW (ref >59)
GFR, Calc, European American: 22 mL/min — ABNORMAL LOW (ref >59)
Glucose: 83 mg/dL (ref 62–125)
Glucose: 166 mg/dL — ABNORMAL HIGH (ref 62–125)
Potassium: 5.2 mEq/L (ref 3.7–5.2)
Potassium: 5.1 mEq/L (ref 3.7–5.2)
Sodium: 136 mEq/L (ref 136–145)
Sodium: 136 mEq/L (ref 136–145)
Urea Nitrogen: 21 mg/dL (ref 8–21)
Urea Nitrogen: 21 mg/dL (ref 8–21)

## 2012-10-10 LAB — CBC (HEMOGRAM)
Hematocrit: 37 % — ABNORMAL LOW (ref 38–50)
Hemoglobin: 12.3 g/dL — ABNORMAL LOW (ref 13.0–18.0)
MCH: 29.1 pg (ref 27.3–33.6)
MCHC: 33.2 g/dL (ref 32.2–36.5)
MCV: 88 fL (ref 81–98)
Platelet Count: 238 10*3/uL (ref 150–400)
RBC: 4.22 mil/uL — ABNORMAL LOW (ref 4.40–5.60)
RDW-CV: 15.2 % — ABNORMAL HIGH (ref 11.6–14.4)
WBC: 8.05 10*3/uL (ref 4.30–10.00)

## 2012-10-10 LAB — HLA TESTING (SENDOUT)

## 2012-10-11 ENCOUNTER — Other Ambulatory Visit (HOSPITAL_COMMUNITY): Payer: Self-pay | Admitting: Unknown Physician Specialty

## 2012-10-11 ENCOUNTER — Other Ambulatory Visit (HOSPITAL_COMMUNITY): Payer: Self-pay | Admitting: Internal Medicine

## 2012-10-11 LAB — CBC (HEMOGRAM)
Hematocrit: 36 % — ABNORMAL LOW (ref 38–50)
Hemoglobin: 12.2 g/dL — ABNORMAL LOW (ref 13.0–18.0)
MCH: 29.2 pg (ref 27.3–33.6)
MCHC: 33.5 g/dL (ref 32.2–36.5)
MCV: 87 fL (ref 81–98)
Platelet Count: 236 10*3/uL (ref 150–400)
RBC: 4.18 mil/uL — ABNORMAL LOW (ref 4.40–5.60)
RDW-CV: 14.9 % — ABNORMAL HIGH (ref 11.6–14.4)
WBC: 6.55 10*3/uL (ref 4.30–10.00)

## 2012-10-11 LAB — BASIC METABOLIC PANEL
Anion Gap: 12 — ABNORMAL HIGH (ref 3–11)
Calcium: 9.2 mg/dL (ref 8.9–10.2)
Carbon Dioxide, Total: 20 mEq/L — ABNORMAL LOW (ref 22–32)
Chloride: 106 mEq/L (ref 98–108)
Creatinine: 2.78 mg/dL — ABNORMAL HIGH (ref 0.51–1.18)
GFR, Calc, African American: 29 mL/min — ABNORMAL LOW (ref >59)
GFR, Calc, European American: 24 mL/min — ABNORMAL LOW (ref >59)
Glucose: 75 mg/dL (ref 62–125)
Potassium: 5.5 mEq/L — ABNORMAL HIGH (ref 3.7–5.2)
Sodium: 138 mEq/L (ref 136–145)
Urea Nitrogen: 20 mg/dL (ref 8–21)

## 2012-10-11 LAB — LEGIONELLA AG, URN: Legionella Ag, URN (Sendout): NEGATIVE

## 2012-10-11 LAB — HIGH RES TYPING, HLA B (SENDOUT)

## 2012-10-15 ENCOUNTER — Other Ambulatory Visit (HOSPITAL_BASED_OUTPATIENT_CLINIC_OR_DEPARTMENT_OTHER): Payer: Self-pay | Admitting: Infectious Disease

## 2012-10-15 ENCOUNTER — Ambulatory Visit (HOSPITAL_BASED_OUTPATIENT_CLINIC_OR_DEPARTMENT_OTHER): Payer: No Typology Code available for payment source | Attending: Infectious Disease | Admitting: Infectious Disease

## 2012-10-15 DIAGNOSIS — F17200 Nicotine dependence, unspecified, uncomplicated: Secondary | ICD-10-CM | POA: Insufficient documentation

## 2012-10-15 DIAGNOSIS — N179 Acute kidney failure, unspecified: Secondary | ICD-10-CM | POA: Insufficient documentation

## 2012-10-15 DIAGNOSIS — R911 Solitary pulmonary nodule: Secondary | ICD-10-CM | POA: Insufficient documentation

## 2012-10-15 DIAGNOSIS — F209 Schizophrenia, unspecified: Secondary | ICD-10-CM | POA: Insufficient documentation

## 2012-10-15 DIAGNOSIS — B20 Human immunodeficiency virus [HIV] disease: Secondary | ICD-10-CM | POA: Insufficient documentation

## 2012-10-15 LAB — BASIC METABOLIC PANEL
Anion Gap: 10 (ref 3–11)
Calcium: 9.7 mg/dL (ref 8.9–10.2)
Carbon Dioxide, Total: 27 mEq/L (ref 22–32)
Chloride: 101 mEq/L (ref 98–108)
Creatinine: 2.25 mg/dL — ABNORMAL HIGH (ref 0.51–1.18)
GFR, Calc, African American: 38 mL/min — ABNORMAL LOW (ref >59)
GFR, Calc, European American: 31 mL/min — ABNORMAL LOW (ref >59)
Glucose: 88 mg/dL (ref 62–125)
Potassium: 5.3 mEq/L — ABNORMAL HIGH (ref 3.7–5.2)
Sodium: 138 mEq/L (ref 136–145)
Urea Nitrogen: 28 mg/dL — ABNORMAL HIGH (ref 8–21)

## 2012-10-16 ENCOUNTER — Ambulatory Visit (HOSPITAL_BASED_OUTPATIENT_CLINIC_OR_DEPARTMENT_OTHER): Payer: No Typology Code available for payment source | Attending: Psychiatry | Admitting: Psychiatry

## 2012-10-16 DIAGNOSIS — F631 Pyromania: Secondary | ICD-10-CM | POA: Insufficient documentation

## 2012-10-16 DIAGNOSIS — F259 Schizoaffective disorder, unspecified: Secondary | ICD-10-CM | POA: Insufficient documentation

## 2012-10-16 DIAGNOSIS — F1220 Cannabis dependence, uncomplicated: Secondary | ICD-10-CM | POA: Insufficient documentation

## 2012-10-17 LAB — HIV1 RNA QUANTITATION
HIV RNA Copies/mL (Log10): <1.6
HIV RNA Result: <40 Copies/mL — AB

## 2012-10-17 LAB — BLOOD FUNGAL CULTURE

## 2012-10-31 LAB — LOWER RESP FUNGAL W/DIR. EXAM: Stain For Fungus: NONE SEEN

## 2012-11-07 ENCOUNTER — Ambulatory Visit (HOSPITAL_BASED_OUTPATIENT_CLINIC_OR_DEPARTMENT_OTHER): Payer: No Typology Code available for payment source | Attending: Infectious Disease | Admitting: Infectious Disease

## 2012-11-07 ENCOUNTER — Other Ambulatory Visit (HOSPITAL_BASED_OUTPATIENT_CLINIC_OR_DEPARTMENT_OTHER): Payer: Self-pay | Admitting: Infectious Disease

## 2012-11-07 DIAGNOSIS — B1920 Unspecified viral hepatitis C without hepatic coma: Secondary | ICD-10-CM | POA: Insufficient documentation

## 2012-11-07 DIAGNOSIS — N179 Acute kidney failure, unspecified: Secondary | ICD-10-CM | POA: Insufficient documentation

## 2012-11-07 DIAGNOSIS — F209 Schizophrenia, unspecified: Secondary | ICD-10-CM | POA: Insufficient documentation

## 2012-11-07 DIAGNOSIS — B20 Human immunodeficiency virus [HIV] disease: Secondary | ICD-10-CM | POA: Insufficient documentation

## 2012-11-07 DIAGNOSIS — R062 Wheezing: Secondary | ICD-10-CM | POA: Insufficient documentation

## 2012-11-07 DIAGNOSIS — R911 Solitary pulmonary nodule: Secondary | ICD-10-CM | POA: Insufficient documentation

## 2012-11-07 DIAGNOSIS — R059 Cough, unspecified: Secondary | ICD-10-CM | POA: Insufficient documentation

## 2012-11-07 LAB — COMPREHENSIVE METABOLIC PANEL
ALT (GPT): 57 U/L — ABNORMAL HIGH (ref 10–48)
AST (GOT): 54 U/L — ABNORMAL HIGH (ref 15–40)
Albumin: 3.4 g/dL — ABNORMAL LOW (ref 3.5–5.2)
Alkaline Phosphatase (Total): 86 U/L (ref 39–139)
Anion Gap: 9 (ref 3–11)
Bilirubin (Total): 1.7 mg/dL — ABNORMAL HIGH (ref 0.2–1.3)
Calcium: 8.7 mg/dL — ABNORMAL LOW (ref 8.9–10.2)
Carbon Dioxide, Total: 25 mEq/L (ref 22–32)
Chloride: 98 mEq/L (ref 98–108)
Creatinine: 0.96 mg/dL (ref 0.51–1.18)
GFR, Calc, African American: >60 mL/min (ref >59)
GFR, Calc, European American: >60 mL/min (ref >59)
Glucose: 82 mg/dL (ref 62–125)
Potassium: 3.8 mEq/L (ref 3.7–5.2)
Protein (Total): 8 g/dL (ref 6.0–8.2)
Sodium: 132 mEq/L — ABNORMAL LOW (ref 136–145)
Urea Nitrogen: 12 mg/dL (ref 8–21)

## 2012-11-08 LAB — T CELL SUBSETS CD4/CD8 ONLY COUNTS
% CD4: 21 % — ABNORMAL LOW (ref 33–61)
% CD8: 64 % — ABNORMAL HIGH (ref 14–35)
Abs CD4 Lymphocyte Cnt: 0.524 10*3/uL — ABNORMAL LOW (ref 0.733–2.25)
Abs CD8 Lymphocyte Cnt: 1.608 10*3/uL — ABNORMAL HIGH (ref 0.25–1.24)
CD4/CD8 Ratio: 0.33

## 2012-11-13 LAB — AFB CULTURE W/STAIN
AFB Stain: NONE SEEN
AFB Stain: NONE SEEN
AFB Stain: NONE SEEN

## 2012-12-12 ENCOUNTER — Encounter (HOSPITAL_BASED_OUTPATIENT_CLINIC_OR_DEPARTMENT_OTHER): Payer: No Typology Code available for payment source | Admitting: Infectious Disease

## 2013-01-04 ENCOUNTER — Encounter (HOSPITAL_BASED_OUTPATIENT_CLINIC_OR_DEPARTMENT_OTHER): Payer: No Typology Code available for payment source | Admitting: Psychiatry

## 2013-01-04 NOTE — Progress Notes (Signed)
No show for appt.  This patient failed a scheduled appointment today.  Provider was notified.

## 2014-08-20 ENCOUNTER — Inpatient Hospital Stay: Admit: 2014-08-20 | Discharge: 2014-08-21 | Disposition: A | Discharge status: Home / Self Care | Payer: MEDICAID

## 2014-08-20 DIAGNOSIS — F200 Paranoid schizophrenia: Secondary | ICD-10-CM

## 2014-08-20 MED ORDER — QUEtiapine (SEROQUEL) 200 MG tablet
200 | ORAL_TABLET | Freq: Every evening | ORAL | Status: AC
Start: 2014-08-20 — End: 2014-08-20

## 2014-08-20 MED ORDER — QUEtiapine (SEROQUEL) 400 MG tablet
400 | ORAL_TABLET | Freq: Every evening | ORAL | Status: AC
Start: 2014-08-20 — End: 2014-09-16
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
## 2014-08-20 NOTE — Unmapped (Signed)
Case Management Agencies   CCHB (Central Community Health Board)  [phone]    Central Intake    [phone]    Drug Services Intake   [phone]   [facility] ([facility]) [phone]   [facility]  [phone]    Case Management    [phone]    Community Living Services   [phone]   [facility]  Adult Intake  [phone]      Child & Family Intake [phone]   SAMI (Substance Abuse and Mental Illness):   through [facility]  Other Community Mental Health Services (Counseling)   [facility] (accepts Medicaid)   [phone]   [facility] (acceps Medicaid)  [phone]   [facility] (accepts Medicaid)   [phone]   [facility] (accepts Medicaid) [phone]   [facility] (accepts Medicaid)  [phone]   [facility] (accepts Medicaid)   [phone]    Support Services   [facility]AP (Mental Health Access Point)   [phone]   Mental Health Board & Recovery Services Board  [phone]   [facility] [place]    [phone]   281-CARE (suicide and crisis hotline)   [phone]   931-WARM, [facility]    [phone]   [facility], [facility]    [phone]   United Way Helpline    211  Shelters Men   [facility]   [phone]   [facility]   [phone]   [facility] (Call CAP line)   [phone]  Shelters Women   [facility]     [phone]   [facility] (Call CAP line)   [phone]  Shelters Men & Women   [facility]     [phone]  Inexpensive Housing Options   [facility]     [phone]   [facility]     [phone]   [facility] (women only)   [phone]
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
## 2014-08-25 NOTE — Unmapped (Addendum)
NEW PATIENT INTAKE DATA    DOB: 1961-06-22    NEW DIAGNOSIS: N    WHERE:  [facility] WHEN:  1992    REFERRED BY: MADDIE JUNGE - [facility]   REFERRAL PHONE:  [phone]   RECORDS REQUESTED: Y         RECORDS RECEIVED:  Y - FROM [facility] ON 08/28/2014    INSURANCE:  [place]                          ACCEPTED AT [facility]?  Y    HOMELESS: Y   CASE MANAGER/CONTACT:  MADDIE Octavio Graves - [facility] PHONE:  [phone]    TRANSFER FROM:  [facility] - LEFT VM FOR [facility] TO CALL ME REGARDING THIS.  PROVIDER:  ?  ADDRESS:   ?  PHONE:   ?   FAX:   ?    ON ARVs   Y  SUFFICIENT SUPPLY:  Y  Case Manager didn't have the ARV's      RECENT HOSPITALIZATION: N      SCHEDULED NPI APPT DATE / TIME: Tuesday, 09/09/2014 @ 1:00p    APPT DATE/TIME CONFIRMED:  Y  REGISTRATION PROCESS EXPLAINED:  Y  OK FOR APPT REMINDER CALL:   Y - CALL CASE MANAGER MADDIE JUNGE  OK TO MAIL NEW PATIENT PACKET:  Y - PUT TO ATTENTION OF MADDIE JUNGE

## 2014-09-01 ENCOUNTER — Ambulatory Visit: Admit: 2014-09-01 | Discharge: 2014-09-02 | Payer: MEDICAID

## 2014-09-01 DIAGNOSIS — Z7189 Other specified counseling: Secondary | ICD-10-CM

## 2014-09-01 NOTE — Unmapped (Signed)
Subjective:       Patient ID: Zachary Jimenez is a 54 y.o. male.    HPI     Patient is a very pleasant 54 y.o. male, new to my practice, here to establish care with me today.     # HIV:  - his risk factors for catching HIV include h/o IVDU and MSM  - he takes his meds and says he doesn't miss doses.    Social history:  - football fan of the [address]  - originally from [place]; moved to [place] from [place] 07/31/2014.  - Living situation: staying at [facility] currently, but planning to move in to transitional housing soon.  - Work: on disability for schizophrenia  - Tobacco: 1/2 ppd  - EtOH: denies  - Illicit drugs: smokes marijuana every day that he can afford it (he says I'm high now--you want me to be honest, doc.); h/o IVDU  - Diet:   - Exercise:  Walks everywhere  - Sexual: not currently sexually active; when he is sexually active, he says he has sex with both women and men.     Family history:  - Father died of lung cancer; he was a smoker  - Mother has h/o breast cancer, she is still alive  - Brother died of cancer; he thinks it was leukemia    The following portions of the patient's history were reviewed and updated as appropriate: allergies, current medications, past family history, past medical history, past social history, past surgical history and problem list.        Review of Systems   Constitutional: Positive for weight gain.   HENT: Negative for trouble swallowing and voice change.    Eyes: Negative for photophobia and visual disturbance.   Respiratory: Negative for cough and shortness of breath.    Cardiovascular: Negative for chest pain.   Gastrointestinal: Negative for nausea, vomiting, abdominal pain, diarrhea and constipation.   Genitourinary: Negative for dysuria and difficulty urinating.   Neurological: Negative for seizures and syncope.   Hematological: Does not bruise/bleed easily.   Psychiatric/Behavioral: Negative for depression, suicidal ideas and hallucinations. The  patient is not nervous/anxious.        Objective:    Physical Exam   Constitutional: He is oriented to person, place, and time. He appears well-developed and well-nourished. No distress.   HENT:   Head: Normocephalic.   Patch over L eye   Eyes: Conjunctivae are normal. No scleral icterus.   Cardiovascular: Regular rhythm, normal heart sounds and intact distal pulses.  Exam reveals no gallop and no friction rub.    No murmur heard.  Pulmonary/Chest: Effort normal and breath sounds normal. No stridor. No respiratory distress. He has no wheezes. He has no rales.   Abdominal: Soft. Bowel sounds are normal. He exhibits no distension. There is no tenderness. There is no rebound and no guarding.   Musculoskeletal: He exhibits no edema.   Neurological: He is alert and oriented to person, place, and time.   Skin: Skin is warm and dry. No rash noted. He is not diaphoretic. No erythema.   Psychiatric: He has a normal mood and affect. His behavior is normal. Judgment and thought content normal.               Assessment:       Plan:   # HIV:  - on atazanavir, truvada, and norvir : adherent to meds  - has f/u in ID clinic next week    #  Paranoid schizophrenia:  - on seroquel  - psychiatric symptoms/mood appear to be compensated    # Homelessness:  - currently in a shelter but ready to move in to apartment soon      # Tobacco abuse:  - precontemplative    - f/u 6 months, or prn prior to that.

## 2014-09-02 ENCOUNTER — Inpatient Hospital Stay: Admit: 2014-09-03 | Payer: PRIVATE HEALTH INSURANCE

## 2014-09-02 DIAGNOSIS — Z111 Encounter for screening for respiratory tuberculosis: Secondary | ICD-10-CM

## 2014-09-08 NOTE — Unmapped (Signed)
Reminder call for 09/09/2014  NPI appointment with Dr. Vella Raring.   Message left with case manager asking patient to arrive 20 minutes early to update information at ground floor registration with photo id, insurance or financial information. To cancel or reschedule call [phone] and select option 1.

## 2014-09-09 ENCOUNTER — Ambulatory Visit: Admit: 2014-09-09 | Payer: MEDICAID | Attending: Infectious Disease

## 2014-09-09 ENCOUNTER — Other Ambulatory Visit: Admit: 2014-09-09 | Payer: MEDICAID

## 2014-09-09 DIAGNOSIS — B20 Human immunodeficiency virus [HIV] disease: Secondary | ICD-10-CM

## 2014-09-09 LAB — HEPATIC FUNCTION PANEL, SERUM
ALT: 75 U/L (ref 7–52)
AST (SGOT): 69 U/L (ref 13–39)
Albumin: 4 g/dL (ref 3.5–5.7)
Alkaline Phosphatase: 98 U/L (ref 36–125)
Bilirubin, Direct: 0.42 mg/dL (ref 0.00–0.40)
Bilirubin, Indirect: 2.18 mg/dL (ref 0.00–1.10)
Total Bilirubin: 2.6 mg/dL (ref 0.0–1.5)
Total Protein: 8.1 g/dL (ref 6.4–8.9)

## 2014-09-09 LAB — LIPID PANEL
Cholesterol, Total: 200 mg/dL (ref 0–200)
HDL: 49 mg/dL (ref 60–92)
LDL Cholesterol: 136 mg/dL
Triglycerides: 74 mg/dL (ref 10–149)

## 2014-09-09 LAB — DIFFERENTIAL
Basophils Absolute: 28 /uL (ref 0–200)
Basophils Relative: 0.5 % (ref 0.0–1.0)
Eosinophils Absolute: 83 /uL (ref 15–500)
Eosinophils Relative: 1.5 % (ref 0.0–8.0)
Lymphocytes Absolute: 1628 /uL (ref 850–3900)
Lymphocytes Relative: 29.6 % (ref 15.0–45.0)
Monocytes Absolute: 495 /uL (ref 200–950)
Monocytes Relative: 9 % (ref 0.0–12.0)
Neutrophils Absolute: 3267 /uL (ref 1500–7800)
Neutrophils Relative: 59.4 % (ref 40.0–80.0)

## 2014-09-09 LAB — RENAL FUNCTION PANEL W/EGFR
Albumin: 4 g/dL (ref 3.5–5.7)
Anion Gap: 6 mmol/L (ref 3–16)
BUN: 11 mg/dL (ref 7–25)
CO2: 26 mmol/L (ref 21–33)
Calcium: 9.1 mg/dL (ref 8.6–10.3)
Chloride: 105 mmol/L (ref 98–110)
Creatinine: 0.71 mg/dL (ref 0.60–1.30)
GFR MDRD Af Amer: 141 See note.
GFR MDRD Non Af Amer: 117 See note.
Glucose: 75 mg/dL (ref 70–100)
Osmolality, Calculated: 282 mosm/kg (ref 278–305)
Phosphorus: 4.4 mg/dL (ref 2.1–4.7)
Potassium: 4 mmol/L (ref 3.5–5.3)
Sodium: 137 mmol/L (ref 133–146)

## 2014-09-09 LAB — HEPATITIS A ANTIBODY TOTAL: Hep A IgG: REACTIVE — AB

## 2014-09-09 LAB — URINALYSIS W/RFL TO MICROSCOPIC
Bilirubin, UA: NEGATIVE
Blood, UA: NEGATIVE
Glucose, UA: NEGATIVE mg/dL
Ketones, UA: NEGATIVE mg/dL
Leukocyte Esterase, UA: NEGATIVE
Nitrite, UA: NEGATIVE
Protein, UA: 30 mg/dL — AB
RBC, UA: 2 /HPF (ref 0–3)
Specific Gravity, UA: 1.021 (ref 1.005–1.035)
Urobilinogen, UA: 4 mg/dL — ABNORMAL HIGH (ref 0.2–1.9)
pH, UA: 7 (ref 5.0–8.0)

## 2014-09-09 LAB — HIV-1 RNA, QUANTITATIVE, PCR
HIV 1 Copies: 463 copies/mL
HIV log10copies: 2.666 log10copy/mL

## 2014-09-09 LAB — LYMPHOCYTE SUBSET FOLLOWUP
% CD 3 Pos. Lymph.: 86.5 % (ref 56.0–89.0)
% CD 4 Pos. Lymph.: 24.5 % (ref 30.0–64.0)
% CD 8 Pos. Lymph.: 61.1 % (ref 6.0–43.0)
Absolute CD 3: 1509 Cells/uL (ref 870–2532)
Absolute CD 4 Helper: 403 Cells/uL (ref 255–2496)
Absolute CD 8 (Supp): 1004 Cells/uL (ref 155–1010)
CD19 % B Cell: 9.5 % (ref 1.0–28.0)
CD19 Abs: 175 Cells/uL (ref 24–683)
CD4/CD8 Ratio: 0.4 ratio (ref 1.10–3.70)

## 2014-09-09 LAB — CBC
Hematocrit: 44.4 % (ref 38.5–50.0)
Hemoglobin: 14.7 g/dL (ref 13.2–17.1)
MCH: 30.4 pg (ref 27.0–33.0)
MCHC: 33.2 g/dL (ref 32.0–36.0)
MCV: 91.5 fL (ref 80.0–100.0)
MPV: 9.3 fL (ref 7.5–11.5)
Platelets: 141 10E3/uL (ref 140–400)
RBC: 4.85 10E6/uL (ref 4.20–5.80)
RDW: 12.8 % (ref 11.0–15.0)
WBC: 5.5 10E3/uL (ref 3.8–10.8)

## 2014-09-09 LAB — HIV 1+2 ANTIBODY/ANTIGEN WITH REFLEX: HIV 1+2 AB/AGN: REACTIVE — AB

## 2014-09-09 LAB — RPR W TITER
RPR Titer: 1:2 {titer}
RPR with Reflex: REACTIVE

## 2014-09-09 LAB — HLA B*5701 TYPING: HLA B5701: NEGATIVE

## 2014-09-09 LAB — CHLAMYDIA / GONORRHOEAE DNA URINE
Chlamydia Trachomatis DNA Urine: NEGATIVE
Neisseria gonorrhoeae DNA Urine: NEGATIVE

## 2014-09-09 LAB — HEPATITIS C ANTIBODY
HCV Ab: REACTIVE — AB
HCVAB Number: 10.96 {s_co_ratio} — ABNORMAL HIGH (ref 0.00–0.79)

## 2014-09-09 LAB — TREPONEMA PALLIDUM AB WITH REFLEX: Treponema Pallidum: POSITIVE

## 2014-09-09 LAB — HEPATITIS B SURFACE ANTIBODY, QUANTITATIVE
HBSAB NUMBER: 13.52 m[IU]/mL (ref 0.00–7.99)
Hep B S Ab: REACTIVE

## 2014-09-09 LAB — HIV AB, CONFIRM
HIV 1 Ab, Confirm: POSITIVE
HIV 2 Ab, Confirm: NEGATIVE
HIV Panel, Final Results: POSITIVE

## 2014-09-09 LAB — HEPATITIS B SURFACE ANTIGEN: Hep B Surface Ag: NONREACTIVE

## 2014-09-09 NOTE — Unmapped (Signed)
Patient ID: Zachary Jimenez is a 54 y.o. male.    Chief Complaint   Patient presents with   ??? HIV Comprehensive Visit (New)        History of Present Illness  Zachary Jimenez is a 54 y.o. male diagnosed with HIV on 01/14/91. His initial IDC visit was on 09/09/14. He has been diagnosed with AIDS. His date of diagnosis of AIDS was on   as a result of a  . His CD4 nadir was 8 on  . His self reported risk behavior for acquiring HIV was MSM, IDU, Heterosexual contact with HIV/high risk individual. He has experienced prior antiretroviral treatment failure.     Today is his first visit to see me. He has missed no dose(s) of antiretrovirals in the last week and reports no side effects from medications. His most recent CD4 count and HIV viral load are as follows:  No results found for: ABSCD4HELP  No results found for: CD4POSLY  No results found for: HIV1COP     Pt has moved all over the country.  Was dx with AIDS in 1992.  Has had a low CD4 (8) with PCP and recurrent PNA in the 1990's.  He also was treated for tuberculosis in 1992 and 1997.  Says he has ben on multiple regimens though he cannot recall when.  Stable on current regimen without complaints (truvada, TAZ/rit).        Histories  He has a past medical history of HIV (human immunodeficiency virus infection); Schizophrenia; Schizophrenia; Tuberculosis (1992, 1997); STD (sexually transmitted disease); Syphilis; and Gonorrhea.    He has past surgical history that includes Eye surgery (Left, 1982).    His family history includes Cancer in his father and mother.    He reports that he has been smoking.  He has never used smokeless tobacco. He reports that he uses illicit drugs (Marijuana). He reports that he does not drink alcohol.    Allergies  Review of patient's allergies indicates no known allergies.    Medications  Outpatient Encounter Prescriptions as of 09/09/2014   Medication Sig Dispense Refill   ??? atazanavir (REYATAZ) 300 MG capsule Take 300 mg by mouth daily with  breakfast.     ??? emtricitabine-tenofovir (TRUVADA) 200-300 mg per tablet Take 1 tablet by mouth daily.     ??? QUEtiapine (SEROQUEL) 400 MG tablet Take 2 tablets (800 mg total) by mouth at bedtime. 60 tablet 0   ??? ritonavir (NORVIR) 100 mg capsule Take by mouth 2 times a day with meals.       No facility-administered encounter medications on file as of 09/09/2014.        Review of Systems   Constitutional: Negative for fever, chills and diaphoresis.   HENT: Negative for sore throat and trouble swallowing.    Eyes: Negative for visual disturbance.   Respiratory: Negative for cough and shortness of breath.    Cardiovascular: Negative for chest pain and leg swelling.   Gastrointestinal: Negative for nausea, vomiting, abdominal pain and diarrhea.   Genitourinary: Negative for dysuria, difficulty urinating and genital sores.   Skin: Negative for color change, pallor, rash and wound.   Neurological: Negative for seizures and headaches.   Hematological: Negative for adenopathy. Does not bruise/bleed easily.   Psychiatric/Behavioral: Negative for suicidal ideas.       Vitals  Blood pressure 134/82, pulse 100, temperature 98.8 ??F (37.1 ??C), temperature source Temporal, resp. rate 16, weight 159 lb (72.122 kg).    Physical Exam  Nursing note and vitals reviewed.  Constitutional: He is oriented to person, place, and time. He appears well-developed and well-nourished. No distress.   HENT:   Head: Normocephalic and atraumatic.       Right Ear: External ear normal.   Left Ear: External ear normal.   Mouth/Throat: Oropharynx is clear and moist. No oropharyngeal exudate.   Eyes: Conjunctivae and EOM are normal. Right eye exhibits no discharge. Left eye exhibits no discharge. No scleral icterus.   Neck: Neck supple. No tracheal deviation present. No thyromegaly present.   Cardiovascular: Normal rate, regular rhythm and normal heart sounds.  Exam reveals no gallop and no friction rub.    No murmur heard.  Pulmonary/Chest: Effort normal  and breath sounds normal. No stridor. No respiratory distress. He has no wheezes. He has no rales.   Abdominal: Soft. He exhibits no distension. There is no tenderness. There is no rebound and no guarding.   Lymphadenopathy:        Head (right side): No submental, no submandibular and no posterior auricular adenopathy present.        Head (left side): No submental, no submandibular and no posterior auricular adenopathy present.     He has no cervical adenopathy.        Right: No supraclavicular adenopathy present.        Left: No supraclavicular adenopathy present.   Neurological: He is alert and oriented to person, place, and time. Cranial nerve deficit: grossly normal.   Skin: Skin is warm and dry. No rash noted. He is not diaphoretic. No erythema. No pallor.   Psychiatric: He has a normal mood and affect. His behavior is normal. Judgment and thought content normal.       Review of Lab Results         Assessment/Plan  Preventative health care  Health Maintenance-  * Colon Cancer Screening: defer to PCP  * Anal Cancer Screening: will discus at a later visit  * Prostate Cancer Screening: defer to PCP  * HCV Screening: check Ab  * Vitamin D:   * STD Screening: check today  * PPD: h/o +PPD  * Lipids:   * Vaccines:     - Influenza: 06/2014     - Pneumovax:      - Prevnar:      - HAV: check Ab     - HBV: check Ab     - HPV: >26yo     - Tdap: 2012      Tobacco abuse  Not ready to quit.

## 2014-09-09 NOTE — Unmapped (Signed)
Pt here for New Pt Intake visit.  Pt dx 1992     ,   Discussed importance of safe sex, Using condoms with every sexual encounter, Discussed the importance of medication adherence,.  Discussed the laws with disclosure with future partners and how the HD can help with past partners.  Pt received info on [facility] and the benefits of having a case Production designer, theatre/television/film. Discussed different medication regimens.  Discussed different lab work that will or have been done.  Pt educated on the HIV specific labs and what they mean. (cd4 and viral load).  Pt aware of FAA/Pharmacy  benefits that are available in fincial.    Bi sexual   Iv drug past used in 1990s  Taking truvada, norvir, Scientist, research (medical)... Taking every day,  States he grew resistance in the past  On current regimen for 2 years  Trans from [facility]  Case manager is Hardin Negus from shelter house  [phone]  +rpr, gc/chlam in early 1990s  Treated with shot per pt  Per pt... He had positive tb test and was treated with meds  Hep c + no treatment  cd4 count on 8 in 1990...   Last cd4 approx 300

## 2014-09-09 NOTE — Unmapped (Addendum)
Health Maintenance-  * Colon Cancer Screening: defer to PCP  * Anal Cancer Screening: will discus at a later visit  * Prostate Cancer Screening: defer to PCP  * HCV Screening: check Ab  * Vitamin D:   * STD Screening: check today  * PPD: h/o +PPD  * Lipids:   * Vaccines:     - Influenza: 06/2014     - Pneumovax:      - Prevnar:      - HAV: check Ab     - HBV: check Ab     - HPV: >52yo     - Tdap: 2012

## 2014-09-09 NOTE — Unmapped (Signed)
Not ready to quit.

## 2014-09-10 NOTE — Unmapped (Addendum)
Please call patient about + hep C ab.  Need to come in for follow up labs.  Please also see if he can tell us where he was treated for syphilis in the past and if we can obtain the RPR at that time.

## 2014-09-11 NOTE — Unmapped (Signed)
He will come tomorrow for labs  I will contact [facility] for syphlis info

## 2014-09-16 ENCOUNTER — Inpatient Hospital Stay: Admit: 2014-09-16 | Discharge: 2014-09-16 | Disposition: A | Discharge status: Home / Self Care | Payer: MEDICAID

## 2014-09-16 DIAGNOSIS — F200 Paranoid schizophrenia: Secondary | ICD-10-CM

## 2014-09-16 MED ORDER — QUEtiapine (SEROQUEL) 400 MG tablet
400 | ORAL_TABLET | Freq: Every evening | ORAL | Status: AC
Start: 2014-09-16 — End: 2014-10-08

## 2014-09-16 NOTE — Unmapped (Signed)
The patient's evaluation in the PES lobby was completed by the MD.  The patient was discharged from the PES lobby area after the discharge instructions were reviewed w/ the patient by the MD.

## 2014-09-16 NOTE — Unmapped (Signed)
Avoid using drugs and alcohol. Take your medications as directed. Follow up as directed above and/or use the resource sheet. Return to PES or call [phone]) in case of psychiatric emergency including suicidal thoughts.

## 2014-09-16 NOTE — Unmapped (Signed)
The patient is currently speaking  with the MD in an interview room.

## 2014-09-16 NOTE — Unmapped (Signed)
54 y.o. Black male presents to [facility] lobby via self referral with chief complaint of needing a seroquel refill. Pt states that he has been unable to secure outpatient services since he was last. He states that he is doing well and denies any other complaints.     Pt is calm, cooperative and conversant. Denies SI/HI, denies A/V H, denies pain/distress. No unsafe behaviors noted. Endorses safety to sit in lobby to await provider. Verbalized understanding of unit, policies and procedures. Will continue to monitor per protocol.
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
## 2014-09-18 NOTE — Unmapped (Signed)
Joanna Puff from HD calling  States pt had rpr 1-1  08/2013  And reactive rpr with no dilusions  03/2013  Should he have dis worker contact pt as new infection  [phone]

## 2014-09-18 NOTE — Unmapped (Signed)
It is likely a treatment failure.  Can we move pt's appt to 3/2 with Dr. Claudius Sis and she can do a detailed treatment hx?

## 2014-09-19 NOTE — Unmapped (Signed)
Cannot make 3/2 appointment- will keep 3/9 appointment with Dr. Manson Passey.

## 2014-10-07 NOTE — Unmapped (Signed)
Reminder call for 10/08/2014 NVP appointment with Dr. Manson Passey.   Message left for patient and case manager Maddie asking patient to arrive 20 minutes early to update information at ground floor registration with photo id, insurance or financial information. To cancel or reschedule call [phone] and select option 1.  Patient advised of new IDC appointment policy.

## 2014-10-08 ENCOUNTER — Ambulatory Visit: Admit: 2014-10-08 | Payer: MEDICAID

## 2014-10-08 DIAGNOSIS — R300 Dysuria: Secondary | ICD-10-CM

## 2014-10-08 MED ORDER — QUEtiapine (SEROQUEL) 400 MG tablet
400 | ORAL_TABLET | Freq: Every evening | ORAL | Status: AC
Start: 2014-10-08 — End: 2014-10-08

## 2014-10-08 MED ORDER — atazanavir (REYATAZ) 300 MG capsule
300 | ORAL_CAPSULE | Freq: Every day | ORAL | Status: AC
Start: 2014-10-08 — End: ?

## 2014-10-08 MED ORDER — QUEtiapine (SEROQUEL) 400 MG tablet
400 | ORAL_TABLET | Freq: Every evening | ORAL | Status: AC
Start: 2014-10-08 — End: 2014-11-07

## 2014-10-08 MED ORDER — ritonavir (NORVIR) 100 mg capsule
100 | ORAL_CAPSULE | Freq: Every day | ORAL | 5.00 refills | 30.00000 days | Status: AC
Start: 2014-10-08 — End: ?

## 2014-10-08 MED ORDER — emtricitabine-tenofovir (TRUVADA) 200-300 mg per tablet
200-300 | ORAL_TABLET | Freq: Every day | ORAL | Status: AC
Start: 2014-10-08 — End: ?

## 2014-10-08 NOTE — Unmapped (Signed)
Refills for your medications were sent to [facility].    Please stop by radiology to have your chest X-ray to look for any sign of pneumonia.    If there is any sign of pneumonia on your chest X-ray, our office will call in an antibiotic to the [facility] for you to take.    Please return to our office in 4 weeks for your next follow up visit.

## 2014-10-08 NOTE — Unmapped (Signed)
Zachary Jimenez is a 54 y.o. male with a history of HIV infection, schizophrenia, prior pulmonary TB s/p treatment x2, & prior syphilis s/p treatment who presents as a new patient to the [facility] in order to transfer his ongoing HIV care.    Chief Complaint: HIV management    HPI:  Zachary Jimenez was diagnosed with HIV in 16. He reports that in 1996 he was diagnosed with AIDS d/t low CD4 count and started on ART. He reports he has had periods when he was off treatment for several months. He reports that currently he has been taking ART since ~2011. He denies any missed doses in the past 7 days. Denies any extended periods off medication since 2011. Previously was having medication prescribed by a physician in [place]; he moved down to [place] about 3 months ago and is currently staying with [place].    He reports onset ~10 days ago of nasal congestion and cough productive of white-to-yellowish sputum. He reports some occasional subjective fevers but denies night sweats. He denies hemoptysis. He reports 9lbs of weight loss that has occurred just since the onset of these symptoms d/t reduced appetite. He denies nausea / vomiting / diarrhea. He reports that he was staying in a homeless shelter at the time of onset of his symptoms and that several people there had similar symptoms. He also endorses some dysuria for the past ~2 weeks.    Reports h/o TB in 1992 and 1997 and states he took medication for a year each time. He reports a prior h/o PJP pneumonia as well. States he has also been hospitalized on 3 separate occasions over the past several years for regular pneumonia. He also reports a prior h/o syphilis diagnosed in the late 90s and treated with 3 shots. He denies any sexual contact since that time.    ROS:  Positives appear in bold; otherwise listed symptoms are negative.  General: fever, chills, weight loss, night sweats  Eyes: double vision, vision loss  ENT: nasal congestion, hearing loss, sore throat  Cardio:  chest pain, palpitations  Pulm: cough, shortness of breath  GI: nausea, vomiting, diarrhea  GU: dysuria, hematuria  Neuro: dizziness, headache  Rheum: joint pain, myalgias  Psych: depression, anxiety    Full ROS was completed and was otherwise negative.     Past medical, family, and social history reviewed and updated as appropriate in the electronic medical record.    Physical Exam:  Blood pressure 127/86, pulse 87, temperature 99.3 ??F (37.4 ??C), temperature source Temporal, resp. rate 16, height 6' 2 (1.88 m), weight 150 lb (68.04 kg), SpO2 94 %.   General: Somewhat cachectic-appearing male in NAD.  HEENT: PERRL / EOMI. Mucous membranes moist. Oropharynx clear without lesions.  Neck: No cervical / supraclavicular LAD.  CV/Chest: RRR with no r/g/m. S1 & S2 present.  Pulm: CTAB without w/r/r. Normal effort.  Abd: Soft, non-tender, non-distended. Bowel sounds present.  Ext: No c/c/e.  Skin: Warm & dry. No rash or skin lesions noted.  Neuro: Sensation grossly intact to light touch throughout. No tremor appreciated.  Psych: A&Ox3. Normal affect.    Current Outpatient Prescriptions   Medication Sig   ??? atazanavir Take 300 mg by mouth daily with breakfast.   ??? emtricitabine-tenofovir Take 1 tablet by mouth daily.   ??? QUEtiapine Take 2 tablets (800 mg total) by mouth at bedtime.   ??? ritonavir Take by mouth 2 times a day with meals.     No current facility-administered medications  for this visit.       Data Review:  Lab Results   Component Value Date    HGB 14.7 09/09/2014    HCT 44.4 09/09/2014    WBC 5.5 09/09/2014    PLT 141 09/09/2014     Lab Results   Component Value Date    NEUTOPHILPCT 59.4 09/09/2014    LYMPHOPCT 29.6 09/09/2014    MONOPCT 9.0 09/09/2014    EOSPCT 1.5 09/09/2014    BASOPCT 0.5 09/09/2014    NEUTROABS 3267 09/09/2014    LYMPHSABS 1628 09/09/2014    MONOSABS 495 09/09/2014    EOSABS 83 09/09/2014    BASOSABS 28 09/09/2014     Lab Results   Component Value Date    NA 137 09/09/2014    K 4.0  09/09/2014    CL 105 09/09/2014    CO2 26 09/09/2014    GLUCOSE 75 09/09/2014    BUN 11 09/09/2014    CREATININE 0.71 09/09/2014    PHOS 4.4 09/09/2014     Lab Results   Component Value Date    ALKPHOS 98 09/09/2014    ALT 75* 09/09/2014    ALBUMIN 4.0 09/09/2014    ALBUMIN 4.0 09/09/2014    BILIDIRECT 0.42* 09/09/2014    BILITOT 2.6* 09/09/2014    PROT 8.1 09/09/2014     Lab Results   Component Value Date    ABSCD4HELP 403.0 09/09/2014    CD4POSLY 24.5* 09/09/2014     Lab Results   Component Value Date    HIV1COP 463 09/09/2014     No results found for: HAV  Lab Results   Component Value Date    HEPBSAG Nonreactive 09/09/2014     Lab Results   Component Value Date    HCVAB Reactive* 09/09/2014     Lab Results   Component Value Date    TREPIA Positive* 09/09/2014    RPRTITER 1:2 09/09/2014    RPR Reactive* 09/09/2014     Lab Results   Component Value Date    [facility] Negative 09/09/2014    GCDNAUR Negative 09/09/2014     No results found for: QUANTIFERTB     Labs reviewed and discussed with patient.    Assessment and Plan:  HIV disease  Diagnosed in 1992 and first started on ART in 1996. He reports a prior h/o treatment for syphilis in 1996 and two courses of treatment completed at separate times in the past for active TB. He has had periods of incomplete adherence to ART but reports regular adherence since 2011.   - continue current atazanavir, ritonavir, Truvada  - check HIV viral load  - pt reporting recent dysuria; will check U/A with reflex to culture; pt recently GC / Chlamydia negative and reports no sexual contact since then  - review vaccinations at next visit in 4 weeks      Hepatitis C antibody test positive  HCV Ab + but no viral load on file. AST, ALT slightly elevated on 09/09/14. Hep A IgG and HBsAb +.  - recheck LFTs  - check screening AFP and RUQ U/S  - check HCV viral load w/ reflex to genotype      Bronchitis  Pt with ~1-2 weeks of nasal congestion & productive cough. Likely represents viral  URI.  - obtain CXR; if opacity is present c/w PNA, will Rx levofloxacin. Otherwise, expect sx will be self-limiting.          Return to [facility] in 4 weeks.  This office visit was discussed with Dr Dion Saucier.    Jozy Mcphearson ALLEN Louretta Tantillo  Infectious Disease Fellow (PGY-4)

## 2014-10-12 NOTE — Unmapped (Addendum)
Diagnosed in 1992 and first started on ART in 1996. He reports a prior h/o treatment for syphilis in 1996 and two courses of treatment completed at separate times in the past for active TB. He has had periods of incomplete adherence to ART but reports regular adherence since 2011.   - continue current atazanavir, ritonavir, Truvada  - check HIV viral load  - pt reporting recent dysuria; will check U/A with reflex to culture; pt recently GC / Chlamydia negative and reports no sexual contact since then  - review vaccinations at next visit in 4 weeks

## 2014-10-12 NOTE — Unmapped (Signed)
Pt with ~1-2 weeks of nasal congestion & productive cough. Likely represents viral URI.  - obtain CXR; if opacity is present c/w PNA, will Rx levofloxacin. Otherwise, expect sx will be self-limiting.

## 2014-10-12 NOTE — Unmapped (Signed)
HCV Ab + but no viral load on file. AST, ALT slightly elevated on 09/09/14. Hep A IgG and HBsAb +.  - recheck LFTs  - check screening AFP and RUQ U/S  - check HCV viral load w/ reflex to genotype

## 2014-10-22 ENCOUNTER — Inpatient Hospital Stay: Admit: 2014-10-22 | Payer: MEDICAID

## 2014-10-22 DIAGNOSIS — N281 Cyst of kidney, acquired: Secondary | ICD-10-CM

## 2014-11-05 ENCOUNTER — Ambulatory Visit: Payer: MEDICAID

## 2014-11-07 MED ORDER — QUEtiapine (SEROQUEL) 400 MG tablet
400 | ORAL_TABLET | ORAL | Status: AC
Start: 2014-11-07 — End: ?

## 2014-11-07 NOTE — Unmapped (Signed)
Refill request received from pharmacy for seroquel; refill approved for 1 additional month but I had previously recommended that pt establish with a PCP as well. Pt no-showed for his [facility] f/u 2 days ago and he will need to reschedule this as well.

## 2014-11-11 NOTE — Unmapped (Addendum)
Follow up phone call to patient regarding 11/05/2014 missed IDC visit with Dr. Manson Passey on 11/05/2014.      Reason(s) for missed visit identified by patient:    other : Late cancellation, patient rescheduled appointment for 12/03/2014 @ 11:00    Interventions offered:  First no show letter and [facility] Appointment Policy sent.    Notification: Message left for case manager Hardin Negus [phone]

## 2014-11-26 ENCOUNTER — Ambulatory Visit (HOSPITAL_BASED_OUTPATIENT_CLINIC_OR_DEPARTMENT_OTHER): Payer: Medicaid Other | Admitting: Infectious Disease

## 2014-11-26 ENCOUNTER — Emergency Department (HOSPITAL_BASED_OUTPATIENT_CLINIC_OR_DEPARTMENT_OTHER)
Admission: EM | Admit: 2014-11-26 | Discharge: 2014-11-26 | Disposition: A | Discharge status: Home / Self Care | Payer: Medicaid Other | Attending: Physician Assistant | Admitting: Physician Assistant

## 2014-11-26 ENCOUNTER — Encounter (HOSPITAL_BASED_OUTPATIENT_CLINIC_OR_DEPARTMENT_OTHER): Payer: Self-pay | Admitting: Counselor

## 2014-11-26 ENCOUNTER — Encounter (HOSPITAL_BASED_OUTPATIENT_CLINIC_OR_DEPARTMENT_OTHER): Payer: Self-pay | Admitting: Infectious Disease

## 2014-11-26 VITALS — BP 115/81 | HR 88 | Temp 97.5°F | Resp 16 | Wt 154.0 lb

## 2014-11-26 DIAGNOSIS — R9431 Abnormal electrocardiogram [ECG] [EKG]: Secondary | ICD-10-CM

## 2014-11-26 DIAGNOSIS — B20 Human immunodeficiency virus [HIV] disease: Secondary | ICD-10-CM

## 2014-11-26 DIAGNOSIS — F258 Other schizoaffective disorders: Secondary | ICD-10-CM | POA: Insufficient documentation

## 2014-11-26 DIAGNOSIS — F209 Schizophrenia, unspecified: Secondary | ICD-10-CM | POA: Insufficient documentation

## 2014-11-26 DIAGNOSIS — Z21 Asymptomatic human immunodeficiency virus [HIV] infection status: Secondary | ICD-10-CM | POA: Insufficient documentation

## 2014-11-26 LAB — COMPREHENSIVE METABOLIC PANEL
ALT (GPT): 27 U/L (ref 10–48)
AST (GOT): 26 U/L (ref 9–38)
Albumin: 4 g/dL (ref 3.5–5.2)
Alkaline Phosphatase (Total): 74 U/L (ref 39–139)
Anion Gap: 5 (ref 4–12)
Bilirubin (Total): 1.9 mg/dL — ABNORMAL HIGH (ref 0.2–1.3)
Calcium: 9.7 mg/dL (ref 8.9–10.2)
Carbon Dioxide, Total: 27 meq/L (ref 22–32)
Chloride: 103 meq/L (ref 98–108)
Creatinine: 0.85 mg/dL (ref 0.51–1.18)
GFR, Calc, African American: >60 mL/min (ref >59)
GFR, Calc, European American: >60 mL/min (ref >59)
Glucose: 82 mg/dL (ref 62–125)
Potassium: 4.1 meq/L (ref 3.6–5.2)
Protein (Total): 8.6 g/dL — ABNORMAL HIGH (ref 6.0–8.2)
Sodium: 135 meq/L (ref 135–145)
Urea Nitrogen: 9 mg/dL (ref 8–21)

## 2014-11-26 LAB — CBC, DIFF
% Basophils: 0 %
% Eosinophils: 2 %
% Immature Granulocytes: 0 %
% Lymphocytes: 39 %
% Monocytes: 10 %
% Neutrophils: 49 %
Absolute Eosinophil Count: 0.07 10*3/uL (ref 0.00–0.50)
Absolute Lymphocyte Count: 1.85 10*3/uL (ref 1.00–4.80)
Basophils: 0.01 10*3/uL (ref 0.00–0.20)
Hematocrit: 40 % (ref 38–50)
Hemoglobin: 13.6 g/dL (ref 13.0–18.0)
Immature Granulocytes: 0.01 10*3/uL (ref 0.00–0.05)
MCH: 30.7 pg (ref 27.3–33.6)
MCHC: 34.4 g/dL (ref 32.2–36.5)
MCV: 89 fL (ref 81–98)
Monocytes: 0.47 10*3/uL (ref 0.00–0.80)
Neutrophils: 2.28 10*3/uL (ref 1.80–7.00)
Platelet Count: 187 10*3/uL (ref 150–400)
RBC: 4.43 10*6/uL (ref 4.40–5.60)
RDW-CV: 13.3 % (ref 11.6–14.4)
WBC: 4.69 10*3/uL (ref 4.30–10.00)

## 2014-11-26 MED ORDER — QUETIAPINE FUMARATE 400 MG OR TABS
800.0000 mg | ORAL_TABLET | Freq: Every evening | ORAL | Status: DC
Start: 2014-11-26 — End: 2014-12-09

## 2014-11-26 MED ORDER — RITONAVIR 100 MG OR TABS
100.0000 mg | ORAL_TABLET | Freq: Every day | ORAL | Status: DC
Start: 2014-11-26 — End: 2015-01-10

## 2014-11-26 MED ORDER — CLOTRIMAZOLE 1 % EX CREA
TOPICAL_CREAM | Freq: Two times a day (BID) | CUTANEOUS | Status: AC
Start: 2014-11-26 — End: ?

## 2014-11-26 MED ORDER — EMTRICITABINE-TENOFOVIR DF 200-300 MG OR TABS
1.0000 | ORAL_TABLET | Freq: Every day | ORAL | Status: DC
Start: 2014-11-26 — End: 2015-01-07

## 2014-11-26 MED ORDER — ATAZANAVIR SULFATE 300 MG OR CAPS
300.0000 mg | ORAL_CAPSULE | Freq: Every day | ORAL | Status: DC
Start: 2014-11-26 — End: 2015-01-07
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
## 2014-11-26 NOTE — Patient Instructions (Signed)
Go to lab today before you leave. Keep scheduled appointment with Dr. Hessie DienerBender.

## 2014-11-27 LAB — HEPATITIS A,B,& C PANEL
Hepatitis A Ab: REACTIVE
Hepatitis B Core Ab: REACTIVE — AB
Hepatitis B Surf Antibody Intl Units: 13.97 [IU]
Hepatitis B Surface Ab: REACTIVE
Hepatitis B Surface Antigen w/Reflex: NONREACTIVE
Hepatitis C Antibody w/Rflx PCR: REACTIVE — AB

## 2014-11-27 LAB — CONFIRMATION SAMPLE INFO:

## 2014-11-27 LAB — GC&CHLAM NUCLEIC ACID DETECTN
Chlam Trachomatis Nucleic Acid: NEGATIVE
Chlam Trachomatis Nucleic Acid: NEGATIVE
N.Gonorrhoeae(GC) Nucleic Acid: NEGATIVE
N.Gonorrhoeae(GC) Nucleic Acid: NEGATIVE

## 2014-11-27 LAB — SEROLOGIC SYPHILIS PANEL, SRM: Syphilis Igg Ab Result: POSITIVE — AB

## 2014-11-27 LAB — SYPHILIS RESULTS CALL

## 2014-11-28 LAB — T CELL SUBSETS CD4/CD8 ONLY COUNTS
% CD4: 29 % — ABNORMAL LOW (ref 33–61)
% CD8: 49 % — ABNORMAL HIGH (ref 14–35)
Abs CD4 Lymphocyte Cnt: 0.414 10*3/uL — ABNORMAL LOW (ref 0.73–2.25)
Abs CD8 Lymphocyte Cnt: 0.705 10*3/uL (ref 0.25–1.24)
CD4/CD8 Ratio: 0.59 — ABNORMAL LOW (ref 1.00–3.78)

## 2014-11-28 LAB — QUANTIFERON TB TEST
Quantiferon TB Ag Result: 0.106 IU gamma IF/mL
Quantiferon TB MIT Result: 2.745 IU gamma IF/mL
Quantiferon TB Nil Result: 0.102 IU gamma IF/mL

## 2014-11-28 LAB — HIV1 RNA QUANTITATION
HIV RNA Copies/mL (Log10): <1.6
HIV RNA Result: <40 {copies}/mL — AB

## 2014-11-29 DIAGNOSIS — B20 Human immunodeficiency virus [HIV] disease: Secondary | ICD-10-CM

## 2014-12-01 ENCOUNTER — Telehealth (HOSPITAL_BASED_OUTPATIENT_CLINIC_OR_DEPARTMENT_OTHER): Payer: Self-pay

## 2014-12-01 NOTE — Telephone Encounter (Signed)
Received telephone message from Immunology lab.  Syphilis Igg from 4/27 is (+).  RPR weakly reactive. Will forward to provider.

## 2014-12-02 ENCOUNTER — Encounter (HOSPITAL_BASED_OUTPATIENT_CLINIC_OR_DEPARTMENT_OTHER): Payer: Self-pay | Admitting: Counselor

## 2014-12-02 LAB — HEPATITIS C RNA, QUANT
Hcv RNA Iu/Ml (Log 10): 5.1 {Log_IU}/mL
Hepatitis C Quant Result: 129607 [IU]/mL — AB
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
## 2014-12-03 ENCOUNTER — Ambulatory Visit: Payer: MEDICAID

## 2014-12-03 ENCOUNTER — Ambulatory Visit (HOSPITAL_BASED_OUTPATIENT_CLINIC_OR_DEPARTMENT_OTHER): Payer: Self-pay | Admitting: Clinical

## 2014-12-03 ENCOUNTER — Encounter (HOSPITAL_BASED_OUTPATIENT_CLINIC_OR_DEPARTMENT_OTHER): Payer: Self-pay | Admitting: Psychiatric/Mental Health

## 2014-12-03 ENCOUNTER — Ambulatory Visit (HOSPITAL_BASED_OUTPATIENT_CLINIC_OR_DEPARTMENT_OTHER): Payer: No Typology Code available for payment source | Admitting: Clinical

## 2014-12-03 ENCOUNTER — Encounter (HOSPITAL_BASED_OUTPATIENT_CLINIC_OR_DEPARTMENT_OTHER): Payer: Self-pay | Admitting: Counselor

## 2014-12-03 DIAGNOSIS — F4310 Post-traumatic stress disorder, unspecified: Secondary | ICD-10-CM

## 2014-12-03 DIAGNOSIS — F1010 Alcohol abuse, uncomplicated: Secondary | ICD-10-CM

## 2014-12-03 DIAGNOSIS — F250 Schizoaffective disorder, bipolar type: Secondary | ICD-10-CM

## 2014-12-03 DIAGNOSIS — F631 Pyromania: Secondary | ICD-10-CM

## 2014-12-03 LAB — SEROLOGIC SYPHILIS CONFIRM
CAPTIA T. pallidum EIA, IgG: REACTIVE — AB
RPR Screen: REACTIVE — AB
Syphilis Conf RPR Confirmation: REACTIVE — AB
Syphilis Conf RPR Quantitative Titer: 1:2 {titer}
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
## 2014-12-04 LAB — RPR QUANTITATIVE (SENDOUT): RPR Quantitative (Sendout): 1:1 {titer} — AB

## 2014-12-04 NOTE — Unmapped (Signed)
Follow up phone call to patient regarding second missed [facility] visit with Dr. Manson Passey on 12/03/2014.      Reason(s) for missed visit identified by patient:    other :DID NOT SPEAK TO [facility], LEFT VM.    Interventions offered:2nd no show letter and IDC appointment policy sent on 12/04/2014    Notification; case manager

## 2014-12-09 ENCOUNTER — Ambulatory Visit (HOSPITAL_BASED_OUTPATIENT_CLINIC_OR_DEPARTMENT_OTHER): Payer: No Typology Code available for payment source | Attending: Psychiatry | Admitting: Psychiatry

## 2014-12-09 DIAGNOSIS — F1220 Cannabis dependence, uncomplicated: Secondary | ICD-10-CM | POA: Insufficient documentation

## 2014-12-09 DIAGNOSIS — F631 Pyromania: Secondary | ICD-10-CM

## 2014-12-09 DIAGNOSIS — F1010 Alcohol abuse, uncomplicated: Secondary | ICD-10-CM | POA: Insufficient documentation

## 2014-12-09 DIAGNOSIS — F1410 Cocaine abuse, uncomplicated: Secondary | ICD-10-CM

## 2014-12-09 DIAGNOSIS — F209 Schizophrenia, unspecified: Secondary | ICD-10-CM | POA: Insufficient documentation

## 2014-12-09 DIAGNOSIS — F79 Unspecified intellectual disabilities: Secondary | ICD-10-CM | POA: Insufficient documentation

## 2014-12-09 MED ORDER — QUETIAPINE FUMARATE 400 MG OR TABS
800.0000 mg | ORAL_TABLET | Freq: Every evening | ORAL | Status: DC
Start: 2014-12-09 — End: 2015-03-31
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
## 2014-12-10 ENCOUNTER — Inpatient Hospital Stay (HOSPITAL_COMMUNITY)
Admit: 2014-12-10 | Payer: No Typology Code available for payment source | Source: Ambulatory Visit | Admitting: Infectious Disease

## 2014-12-10 ENCOUNTER — Inpatient Hospital Stay
Admission: EM | Admit: 2014-12-10 | Discharge: 2014-12-11 | DRG: 720 | Disposition: A | Discharge status: Home / Self Care | Payer: No Typology Code available for payment source | Source: Other Acute Inpatient Hospital | Attending: Internal Medicine | Admitting: Internal Medicine

## 2014-12-10 ENCOUNTER — Encounter (HOSPITAL_BASED_OUTPATIENT_CLINIC_OR_DEPARTMENT_OTHER): Payer: Self-pay | Admitting: Counselor

## 2014-12-10 ENCOUNTER — Ambulatory Visit (HOSPITAL_BASED_OUTPATIENT_CLINIC_OR_DEPARTMENT_OTHER): Payer: No Typology Code available for payment source | Admitting: Infectious Disease

## 2014-12-10 ENCOUNTER — Emergency Department (HOSPITAL_BASED_OUTPATIENT_CLINIC_OR_DEPARTMENT_OTHER)
Admission: EM | Admit: 2014-12-10 | Discharge: 2014-12-10 | Disposition: A | Discharge status: Other Acute Inpatient Hospital | Payer: No Typology Code available for payment source | Source: Ambulatory Visit | Attending: Infectious Disease | Admitting: Infectious Disease

## 2014-12-10 VITALS — BP 99/65 | HR 105 | Temp 97.7°F | Resp 16 | Wt 158.4 lb

## 2014-12-10 DIAGNOSIS — Z21 Asymptomatic human immunodeficiency virus [HIV] infection status: Secondary | ICD-10-CM | POA: Insufficient documentation

## 2014-12-10 DIAGNOSIS — Z66 Do not resuscitate: Secondary | ICD-10-CM | POA: Diagnosis present

## 2014-12-10 DIAGNOSIS — J45909 Unspecified asthma, uncomplicated: Secondary | ICD-10-CM | POA: Insufficient documentation

## 2014-12-10 DIAGNOSIS — Z8611 Personal history of tuberculosis: Secondary | ICD-10-CM

## 2014-12-10 DIAGNOSIS — A419 Sepsis, unspecified organism: Principal | ICD-10-CM | POA: Diagnosis present

## 2014-12-10 DIAGNOSIS — J111 Influenza due to unidentified influenza virus with other respiratory manifestations: Secondary | ICD-10-CM | POA: Insufficient documentation

## 2014-12-10 DIAGNOSIS — B20 Human immunodeficiency virus [HIV] disease: Secondary | ICD-10-CM

## 2014-12-10 DIAGNOSIS — J189 Pneumonia, unspecified organism: Secondary | ICD-10-CM

## 2014-12-10 DIAGNOSIS — F209 Schizophrenia, unspecified: Secondary | ICD-10-CM | POA: Diagnosis present

## 2014-12-10 DIAGNOSIS — I959 Hypotension, unspecified: Secondary | ICD-10-CM

## 2014-12-10 DIAGNOSIS — Z590 Homelessness: Secondary | ICD-10-CM

## 2014-12-10 DIAGNOSIS — R05 Cough: Secondary | ICD-10-CM

## 2014-12-10 DIAGNOSIS — F1290 Cannabis use, unspecified, uncomplicated: Secondary | ICD-10-CM | POA: Diagnosis present

## 2014-12-10 DIAGNOSIS — E861 Hypovolemia: Secondary | ICD-10-CM | POA: Diagnosis present

## 2014-12-10 DIAGNOSIS — R0602 Shortness of breath: Secondary | ICD-10-CM

## 2014-12-10 DIAGNOSIS — E871 Hypo-osmolality and hyponatremia: Secondary | ICD-10-CM | POA: Diagnosis present

## 2014-12-10 DIAGNOSIS — F17210 Nicotine dependence, cigarettes, uncomplicated: Secondary | ICD-10-CM | POA: Diagnosis present

## 2014-12-10 DIAGNOSIS — Z8619 Personal history of other infectious and parasitic diseases: Secondary | ICD-10-CM

## 2014-12-10 DIAGNOSIS — A150 Tuberculosis of lung: Secondary | ICD-10-CM | POA: Insufficient documentation

## 2014-12-10 LAB — COMPREHENSIVE METABOLIC PANEL
ALT (GPT): 30 U/L (ref 10–48)
AST (GOT): 24 U/L (ref 9–38)
Albumin: 3.6 g/dL (ref 3.5–5.2)
Alkaline Phosphatase (Total): 70 U/L (ref 39–139)
Anion Gap: 7 (ref 4–12)
Bilirubin (Total): 1.1 mg/dL (ref 0.2–1.3)
Calcium: 9 mg/dL (ref 8.9–10.2)
Carbon Dioxide, Total: 22 meq/L (ref 22–32)
Chloride: 100 meq/L (ref 98–108)
Creatinine: 1.04 mg/dL (ref 0.51–1.18)
GFR, Calc, African American: >60 mL/min (ref >59)
GFR, Calc, European American: >60 mL/min (ref >59)
Glucose: 91 mg/dL (ref 62–125)
Potassium: 3.8 meq/L (ref 3.6–5.2)
Protein (Total): 7.9 g/dL (ref 6.0–8.2)
Sodium: 129 meq/L — ABNORMAL LOW (ref 135–145)
Urea Nitrogen: 10 mg/dL (ref 8–21)

## 2014-12-10 LAB — CBC, DIFF
% Basophils: 0 %
% Eosinophils: 0 %
% Immature Granulocytes: 1 %
% Lymphocytes: 9 %
% Monocytes: 7 %
% Neutrophils: 83 %
Absolute Eosinophil Count: 0 10*3/uL (ref 0.00–0.50)
Absolute Lymphocyte Count: 1.79 10*3/uL (ref 1.00–4.80)
Basophils: 0.02 10*3/uL (ref 0.00–0.20)
Hematocrit: 36 % — ABNORMAL LOW (ref 38–50)
Hemoglobin: 12.4 g/dL — ABNORMAL LOW (ref 13.0–18.0)
Immature Granulocytes: 0.1 10*3/uL — ABNORMAL HIGH (ref 0.00–0.05)
MCH: 30.6 pg (ref 27.3–33.6)
MCHC: 34.4 g/dL (ref 32.2–36.5)
MCV: 89 fL (ref 81–98)
Monocytes: 1.5 10*3/uL — ABNORMAL HIGH (ref 0.00–0.80)
Neutrophils: 17.49 10*3/uL — ABNORMAL HIGH (ref 1.80–7.00)
Platelet Count: 192 10*3/uL (ref 150–400)
RBC: 4.05 10*6/uL — ABNORMAL LOW (ref 4.40–5.60)
RDW-CV: 13.7 % (ref 11.6–14.4)
WBC: 20.9 10*3/uL — ABNORMAL HIGH (ref 4.30–10.00)

## 2014-12-10 LAB — L LACTATE, VENOUS WB (TO U/H LAB WITHIN 30 MIN): L Lactate (Direct), Venous Whole Blood: 1.1 mmol/L (ref 0.6–1.9)

## 2014-12-10 MED ORDER — AZITHROMYCIN 500 MG OR TABS
500.0000 mg | ORAL_TABLET | Freq: Once | ORAL | Status: AC
Start: 2014-12-10 — End: 2014-12-10
  Administered 2014-12-10: 500 mg via ORAL

## 2014-12-10 MED ORDER — KETOROLAC TROMETHAMINE 30 MG/ML IJ SOLN
30.0000 mg | Freq: Once | INTRAMUSCULAR | Status: AC
Start: 2014-12-10 — End: 2014-12-10
  Administered 2014-12-10: 30 mg via INTRAMUSCULAR

## 2014-12-10 MED ORDER — OSELTAMIVIR PHOSPHATE 75 MG OR CAPS
150.0000 mg | ORAL_CAPSULE | Freq: Once | ORAL | Status: AC
Start: 2014-12-10 — End: 2014-12-10
  Administered 2014-12-10: 150 mg via ORAL

## 2014-12-10 MED ORDER — CEFTRIAXONE SODIUM 1 G IJ SOLR
1.0000 g | Freq: Once | INTRAMUSCULAR | Status: DC
Start: 2014-12-10 — End: 2014-12-10

## 2014-12-10 MED ORDER — OSELTAMIVIR PHOSPHATE 75 MG OR CAPS
150.0000 mg | ORAL_CAPSULE | Freq: Two times a day (BID) | ORAL | Status: AC
Start: 2014-12-10 — End: 2014-12-15

## 2014-12-10 NOTE — Progress Notes (Signed)
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 Pt. is homeless and had [facility]. Pt. has an unrealistic expectation about how housing works and minimizes his own personal choices regarding his homelessness. PCP Dr. Deboraha Sprangachel Ignacio.    INTERVENTION / OUTCOME / PLAN:  Writer was [facility] who gave an update on this pt, reporting that he needs some IV fluids. However, the pt is in a hurry to leave because he does not want to lose [facility]. Writer phoned Carmell [facility]Karen Adler [facility] and left a message asking for an extension on that bed if possible.    As the situation developed, RN Fannie KneeSue reports that the pt will [facility] because after the Doctor was able to review his lab work it became clear that he has some sort of infection.     Writer discussed with the pt who would prefer to not have to go inpatient but is agreeable.     Writer informed the pt that if he does need to go inpatient, I will begin working on a medical motel referral for when he is discharged in order to support his fragile health at this time. Writer informed that it is not guaranteed that a motel spot would be available for him but Clinical research associatewriter will advocate on his behalf for the best possible option.    DOCUMENT COMPOSED BY:   Betsey AmenMike Keenon Leitzel, MSW [phone] on 12/10/2014

## 2014-12-10 NOTE — Progress Notes (Signed)
22G angiocath placed in right upper arm on the thrid attempt  Blood drawn from IV site for blood cultures x 1, CBC with diff, and CMP.  Specimens sent to lab    1000 ml NS started infusing at 1550, changed to D5NS after 500 ml infused, infusing full speed    Administrations This Visit     azithromycin 500 mg tablet     Admin Date Action Dose Route Administered By             12/10/2014 Given 500 mg Oral Garey HamFenoglio, Susan M, RN                    ketorolac 30 mg/mL injection     Admin Date Action Dose Route Administered By             12/10/2014 Given 30 mg Intramuscular Garey HamFenoglio, Susan M, RN                    oseltamivir 75 mg capsule     Admin Date Action Dose Route Administered By             12/10/2014 Given 150 mg Oral Fenoglio, Georga HackingSusan M, RN

## 2014-12-10 NOTE — Progress Notes (Signed)
[facility] Visit    ID/CC: Patrick Davis is a 54 year old year old male with Stage 3 HIV( CD4 414, VL und) here for influenza like illness.      HPI:  I haven't seen Cap in 2 years. He was recently treated in [place] and just moved back.  Seen in ED for stabilization of his schizophrenia and refill of quetiapine.  He reports he was healthy until yesterday when he developed chills, rhinorrhea, and a bit of SOB, but he really endorses that the SOB was "difficulty breathing in the nose" rather than true SOB.  He has been coughing, not particularly productive. Staying in shelter, where he's sleeping like sardines with other people.  He endorses anorexia and nausea today, no diarrhea or constipation. No myalgias/arthralgias or other sx.  Reports no issues with breathing until yesterday.    He is doing OK with clear liquids, but has not eaten at all today    Of note, he had PTB x 2 in the 1990s, PJP, and also HCAP and CAP x 3.  He doesn't have formal dx of COPD, but has previously had wheezing and ?dx of allergies and asthma. He currently smokes tobacco and occasionally MJ, hx of smoking crack, none recently per him.       Problem List:  Patient Active Problem List    Diagnosis Date Noted   . Influenza-like illness [J11.1] 12/10/2014   . Schizophrenia [F20.9] 11/26/2014     Per patient first diagnosed when a teenager.      . Acute kidney injury [N17.9]      TX FROM [facility]       . Healthcare-associated pneumonia [J18.9]      TX FROM [facility]       . Human immunodeficiency virus infection [Z21]      TX FROM [facility]       . Pneumonia [J18.9]      TX FROM [facility]             ROS  A full review of systems was performed and was negative except as documented in the HPI.    Medications:  Current Outpatient Prescriptions   Medication Sig Dispense Refill   . ALBUTEROL IN 90 mcg/inh inhalation aerosol) 2 puff(s) Inhalation Q4 Hours PRN for wheezing     . Atazanavir Sulfate 300 MG Oral Cap Take 1 capsule (300 mg) by mouth daily. Give with  ritonavir 100mg  daily 30 capsule 11   . Atazanavir Sulfate 300 MG Oral Cap 300 mg PO Daily     . Beclomethasone Dipropionate 40 MCG/ACT Inhalation Aero Soln 1 puff(s) Inhalation BID     . Clotrimazole 1 % External Cream Apply to affected area on leg(s) 2 times a day. For fungal rash. 45 g 2   . Emtricitabine-Tenofovir (TRUVADA) 200-300 MG Oral Tab Take 1 tablet by mouth daily. 30 tablet 11   . Emtricitabine-Tenofovir (TRUVADA) 200-300 MG Oral Tab 1 tab PO Daily     . Ondansetron HCl (ZOFRAN) 4 MG/5ML Oral Solution 0.4 mg PO BID     . QUEtiapine Fumarate 400 MG Oral Tab Take 2 tablets (800 mg) by mouth at bedtime. 60 tablet 6   . Ritonavir 100 MG Oral Tab Take 1 tablet (100 mg) by mouth daily. 30 tablet 11   . Ritonavir 100 MG Oral Tab 100 mg PO Daily       Current Facility-Administered Medications   Medication Dose Route Frequency Provider Last Rate Last Dose   .  ketorolac 30 mg/mL injection  30 mg Intramuscular Once Dulcy Fanny, Windy Fast, MD           Allergies:  Review of patient's allergies indicates:  No Known Allergies    Social History:  History     Social History   . Marital Status: Single     Spouse Name: N/A   . Number of Children: N/A   . Years of Education: N/A     Occupational History   . Not on file.     Social History Main Topics   . Smoking status: Current Every Day Smoker   . Smokeless tobacco: Not on file   . Alcohol Use: Not on file   . Drug Use: Not on file   . Sexual Activity: Not on file     Other Topics Concern   . Not on file     Social History Narrative   . No narrative on file       Family History:  No family history on file.    Physical Exam:  Vital Signs: BP 89/59 mmHg  Pulse 132  Temp(Src) 100.9 F (38.3 C) (Temporal)  Wt 158 lb 6.4 oz (71.85 kg)  SpO2 95%, There is no height on file to calculate BMI.  Gen: mildy ill appearing, but sitting up, speaking in full sentences, no extra WOB and doesn't appear tachypneic.  HEENT: mmm, sclera anicteric. Cannot appreciate oropharynx  CV:  tachycardic, no murmurs.   Pulm: Lungs w/o wheezes. Maybe slight crackles LLL, great air movement  Skin: no rash  Neuro: a&0x3    Labs:   HIV  Results for orders placed or performed in visit on 11/26/14   T CELL SUBSET _ CD4 & CD8 ONLY   Result Value Ref Range    % CD4 29 (L) 33 - 61 %    Abs CD4 Lymphocyte Cnt 0.414 (L) 0.73 - 2.25 10*3/uL    % CD8 49 (H) 14 - 35 %    Abs CD8 Lymphocyte Cnt 0.705 0.25 - 1.24 10*3/uL    CD4/CD8 Ratio 0.59 (L) 1.00 - 3.78    Test Comment       This test was developed and its performance characteristics determined by [facility], Department of Laboratory Medicine. It has not been cleared or approved by the U.S. Food and Drug Administration.     Results for orders placed or performed in visit on 11/26/14   HIV1 RNA QUANTITATION   Result Value Ref Range    HIV RNA Specimen Plasma     HIV RNA Result <40 (A) NDET [copies]/mL    HIV RNA Copies/mL (Log10) <1.60     HIV RNA Interp       HIV-1 RNA was detected in this sample but below the assay's limit of quantitation.  The analytic range for the Abbott RealTime HIV-1 RNA assay is 40 to 10,000,000 copies/mL.               Assessment and Plan:  Patrick Davis was seen today for follow-up HIV and is acutely ill with ILI.    Diagnoses and all orders for this visit:    Influenza-like illness-  We initially intended to try outpt management with stat labs, CXR, and planned recheck VS: however, WBC 20.9, Na 129, and CXr with possible R hilar infiltrate- very subtle if anything.  Given VS, degree of leukocytosis, hx of recurrent lung infections and ? Asthma/COPD, would benefit from admission.      Azithro given  in clinic. CTX cancelled since CXR clear. Given oseltamivir x 1 also  Orders:  -     IV START  -     BLOOD C/S  -     COMPREHENSIVE METABOLIC PANEL  -     CBC, DIFF  -     X-RAY CHEST 2 VW  -     RESP VIRUS SEMI-QUANT PCR  -     ketorolac 30 mg/mL injection; Inject 1 mL (30 mg) intramuscularly One time.  -     5% DEXTROSE/NORMAL SALINE  -      oseltamivir 75 mg capsule; Take 2 capsules (150 mg) by mouth One time.  -     Oseltamivir Phosphate 75 MG Oral Cap; Take 2 capsules (150 mg) by mouth 2 times a day for 5 days.      HIV:  Suppressed on TRU/ATV/r - please continue during admission      HCM  Immunizations:   Immunization History   Administered Date(s) Administered   . pneumococcal (Pneumovax 23) polysaccharide vaccine 09/15/2012     +

## 2014-12-11 ENCOUNTER — Encounter (HOSPITAL_BASED_OUTPATIENT_CLINIC_OR_DEPARTMENT_OTHER): Payer: Self-pay | Admitting: Counselor

## 2014-12-11 ENCOUNTER — Ambulatory Visit: Payer: No Typology Code available for payment source

## 2014-12-11 ENCOUNTER — Encounter (INDEPENDENT_AMBULATORY_CARE_PROVIDER_SITE_OTHER): Payer: Self-pay | Admitting: Internal Medicine

## 2014-12-11 LAB — RESPIRATORY VIRUS PANEL, SEMI-QUANT PCR
Adenovirus PCR Qual Result: NOT DETECTED
Bocavirus PCR Qual Result: NOT DETECTED
Coronavirus PCR Qual Result: NOT DETECTED
Influenza A PCR Qual Result: NOT DETECTED
Influenza B PCR Qual Result: NOT DETECTED
Metapneumovirus PCR Qual Rslt: NOT DETECTED
Parainfluenza 1 PCR Qual Rslt: NOT DETECTED
Parainfluenza 2 PCR Qual Rslt: NOT DETECTED
Parainfluenza 3 PCR Qual Rslt: NOT DETECTED
Parainfluenza 4 PCR Qual Rslt: NOT DETECTED
Rhinovirus PCR Qual Result: NOT DETECTED
Rsv PCR Qual Result: NOT DETECTED

## 2014-12-11 LAB — BASIC METABOLIC PANEL
Anion Gap: 4 (ref 4–12)
Calcium: 8.7 mg/dL — ABNORMAL LOW (ref 8.9–10.2)
Carbon Dioxide, Total: 23 meq/L (ref 22–32)
Chloride: 108 meq/L (ref 98–108)
Creatinine: 0.74 mg/dL (ref 0.51–1.18)
GFR, Calc, African American: >60 mL/min (ref >59)
GFR, Calc, European American: >60 mL/min (ref >59)
Glucose: 74 mg/dL (ref 62–125)
Potassium: 4.2 meq/L (ref 3.6–5.2)
Sodium: 135 meq/L (ref 135–145)
Urea Nitrogen: 11 mg/dL (ref 8–21)

## 2014-12-11 LAB — CBC, DIFF
% Basophils: 0 %
% Eosinophils: 0 %
% Immature Granulocytes: 0 %
% Lymphocytes: 16 %
% Monocytes: 5 %
% Neutrophils: 79 %
% Nucleated RBC: 0 %
Absolute Eosinophil Count: 0.03 10*3/uL (ref 0.00–0.50)
Absolute Lymphocyte Count: 2.87 10*3/uL (ref 1.00–4.80)
Basophils: 0.05 10*3/uL (ref 0.00–0.20)
Hematocrit: 37 % — ABNORMAL LOW (ref 38–50)
Hemoglobin: 12.3 g/dL — ABNORMAL LOW (ref 13.0–18.0)
Immature Granulocytes: 0.08 10*3/uL — ABNORMAL HIGH (ref 0.00–0.05)
MCH: 30.4 pg (ref 27.3–33.6)
MCHC: 33.2 g/dL (ref 32.2–36.5)
MCV: 92 fL (ref 81–98)
Monocytes: 0.89 10*3/uL — ABNORMAL HIGH (ref 0.00–0.80)
Neutrophils: 13.87 10*3/uL — ABNORMAL HIGH (ref 1.80–7.00)
Nucleated RBC: 0 10*3/uL
Platelet Count: 181 10*3/uL (ref 150–400)
RBC: 4.05 10*6/uL — ABNORMAL LOW (ref 4.40–5.60)
RDW-CV: 13.4 % (ref 11.6–14.4)
WBC: 17.79 10*3/uL — ABNORMAL HIGH (ref 4.3–10.0)

## 2014-12-11 LAB — INFLUENZA A/B PCR (NWH): Influenza A/B PCR Result: NEGATIVE

## 2014-12-11 NOTE — Progress Notes (Signed)
DOCUMENT TYPE: Social Work - Follow-up Note    PATIENT: Patrick Davis, Patrick Davis [id number]  DOB: 11/14/1960    ENCOUNTER DATE:  12/11/2014    ASSOCIATED PROGRAM-CLINIC:  [facility]    CONTACT TYPE:  Face to Face - Office    BRIEF DESCRIPTION:  [facility]    PATIENT DESCRIPTION, PRESENTING PROBLEM, PATIENT / FAMILY GOALS:  54 y/o, AA male Stage III recent relocation to establish care after being in [place] a couple of years. Pt. is homeless and [facility]. Pt. has an unrealistic expectation about how housing works and minimizes his own personal choices regarding his homelessness. PCP Dr. Deboraha Sprangachel Ignacio.    INTERVENTION / OUTCOME / PLAN:  Writer [facility] and [facility]. Pt reported that he is going to discharge today. Writer got in contact with the Social Worker there and spoke with the SWer who confirmed that he is discharging today. Writer informed the SWer that I [facility] and they confirmed that the pt will have a matt tonight reserved.     Writer also left a message for Kathreen Devoidony Koester at [facility] advocating for a medical motel bed for this pt.     Writer phoned [employer] back and informed him that I will call him at 3:30 on this date to inform him if I was able to get a medical motel placement for him today, but also informed that it is not a certainty. Pt [facility] on this date and if a better option becomes available he will be open to that as well.     Writer to F/U with this pt later on this date.    DOCUMENT COMPOSED BY:   Betsey AmenMike Bristol Soy, MSW [phone] on 12/11/2014

## 2014-12-12 ENCOUNTER — Encounter (HOSPITAL_BASED_OUTPATIENT_CLINIC_OR_DEPARTMENT_OTHER): Payer: Self-pay | Admitting: Counselor

## 2014-12-12 NOTE — Progress Notes (Signed)
DOCUMENT TYPE: Social Work - Follow-up Note    PATIENT: Patrick Davis, Patrick Davis [id number]  DOB: 02/12/1961    ENCOUNTER DATE:  12/12/2014    ASSOCIATED PROGRAM-CLINIC:  [facility]    CONTACT TYPE:  Face to Face - Office    BRIEF DESCRIPTION:  F/U re: ABx from [facility]/shelter options    PATIENT DESCRIPTION, PRESENTING PROBLEM, PATIENT / FAMILY GOALS:  54 y/o, AA male Stage III recent relocation to establish care after being in [place] for a couple of years. Pt. is homeless and had been staying at the [facility].    INTERVENTION / OUTCOME / PLAN:  Writer met with this pt in the lobby of [place]. Pt has ABx scripts in hand from [facility] as he did not fill them upon discharge.     Writer spoke with [facility] pharmacy who asked that pt try to fill at [place]. Writer walked pt to the [facility] and they were unable to fill because they did not have one of the two meds in stock.     Writer walked pt back to the [facility] and they advised to have pt fill at any [facility] or [employer]. Writer discussed with pt who reports he will fill at [place] downtown as he is headed to [place] [place] to pick up mail. Writer provided bus tix.    Writer also discussed shelter options, informed the pt that I will work on getting him an alternative to [employer] but may not be able to secure a different shelter bed or medical motel for him on this date.     Writer took down a new cell phone number for the pt as I had an old one.     Writer discussed [facility] referral. Pt declines at this time reporting that he likes to spend his time at the [place] center during the day. Writer will encourage the pt to consider visiting [facility].     Writer to F/U with Loni Beckwith regarding emergency housing options for the pt on this date.     Writer advised the pt to come into clinic as often as he'd like at this time while we work to get him into a more stable living situation and address significant medical needs.    DOCUMENT COMPOSED BY:   Elly Modena, MSW [phone] on 12/12/2014

## 2014-12-15 LAB — BLOOD C/S: Culture: NO GROWTH

## 2014-12-17 NOTE — Progress Notes (Signed)
[facility]-Outpatient Progress Note    12/11/2014    Reason for Visit:  Chief Complaint   Patient presents with   . Follow-Up        Subjective: Received multiple phone calls about the patient before meeting him.  Social worker from [facility] and  Epifania GoreMike Nicolson his [facility].  Both making sure [name] has a mat tonight [facility],   this has been arranged.  Upon meeting [name] I offered him respite for a few days 2/2 his pneumonia/influenza,   he prefers to be out during the day and refused mat rest.  Informed him of my hours and if he needed any thing just   come to the office.     [facility]    [facility]    Living Arrangements: homeless    Pt's PCP:  Dulcy FannyBender Ignacio, Windy Fastachel A, MD    Vitals:  There were no vitals taken for this visit.    ROS: Identified by name and birth date   Constitutional: unkept in casual street clothes   Eyes: no c/o   Ears, Nose, Mouth, Throat: nasal congestion   Cardiovascular: denies any chest pain   Respiratory: denies any shortness of breath, did c/o cough with small amount of clear sputum   Gastrointestinal: reports poor apetite   Genitourinary: no c/o   Musculoskeletal: steady gait, denies any falls   Skin: no c/o   Neurological:alert and oriented x3   Psychiatric: not very engaged in conversation, did not at first want to enter the office, fair eye contact      Referrals: no    Medications provided per protocol: na    Plan:  Keep medical appts  Stay on medications as prescribed  Reach out if you need help  Eat more often  Patient voiced understanding      Electronically signed:  Carmell AustriaKaren Adler RN

## 2014-12-23 ENCOUNTER — Encounter (HOSPITAL_BASED_OUTPATIENT_CLINIC_OR_DEPARTMENT_OTHER): Payer: No Typology Code available for payment source | Admitting: Clinical

## 2014-12-24 ENCOUNTER — Ambulatory Visit: Payer: No Typology Code available for payment source

## 2014-12-24 ENCOUNTER — Encounter (HOSPITAL_BASED_OUTPATIENT_CLINIC_OR_DEPARTMENT_OTHER): Payer: No Typology Code available for payment source | Admitting: Infectious Disease

## 2014-12-30 DIAGNOSIS — B20 Human immunodeficiency virus [HIV] disease: Secondary | ICD-10-CM

## 2014-12-31 ENCOUNTER — Encounter (HOSPITAL_BASED_OUTPATIENT_CLINIC_OR_DEPARTMENT_OTHER): Payer: Self-pay | Admitting: Counselor

## 2014-12-31 ENCOUNTER — Encounter (HOSPITAL_BASED_OUTPATIENT_CLINIC_OR_DEPARTMENT_OTHER): Payer: Self-pay | Admitting: Clinical

## 2014-12-31 NOTE — Unmapped (Signed)
No Show letter dated 11/11/2014 was returned stating    RETURN TO SENDER - ATTEMPTED - NOT KNOWN- UNABLE TO FORWARD. Put in LOST TO F/U' notebook.

## 2014-12-31 NOTE — Progress Notes (Signed)
DOCUMENT TYPE: Social Work - Follow-up Note    PATIENT: Patrick Davis, [name]  DOB: 09/26/1960    ENCOUNTER DATE:  12/31/2014    ASSOCIATED PROGRAM-CLINIC:  [facility]    CONTACT TYPE:  Telephone    BRIEF DESCRIPTION:  F/U re: health, housing, meds    PATIENT DESCRIPTION, PRESENTING PROBLEM, PATIENT / FAMILY GOALS:  54 y/o, AA male Stage III recent relocation to establish care after being in [place] for a couple of years. Pt. is homeless and [facility].    INTERVENTION / OUTCOME / PLAN:  Writer received a message from Dr. Hessie DienerBender while I was away from clinic who reports concern over the pt's health status as he was sick during the last [facility].     Writer reached the pt at the number listed in the [facility] and checked-in with him. Pt reports that he is feeling "ok". Writer made plans to [facility] at 9am, pt will need to refill his meds at that time and writer will assist with this and also assess other SW needs.     Writer spoke with Carmell AustriaKaren Adler RN [facility] on this date [phone] who reports that she can give pt messages about appointments as he has a tendency to miss them. Clydie BraunKaren would also like Clinical research associatewriter to inform his [facility] case manager that she is available to help communicate appointments to the pt or any other messages as [facility].     Writer made the next available appointment with Dr. Hessie DienerBender for 6/8 at 12:45pm, called Carmell AustriaKaren Adler and informed her to remind the pt.    Writer called Tish MenLinda Sayers [phone] CM on the [facility] team at [facility] who follows this pt and left her a VM informing that Carmell AustriaKaren Adler is a good contact person for this pt as she sees him daily, left Karen's contact information on Linda's voicemail. Will plan to [facility] morning.    DOCUMENT COMPOSED BY:   Betsey AmenMike Cotton Beckley, MSW [phone] on 12/31/2014

## 2014-12-31 NOTE — Progress Notes (Signed)
Consulted with [facility] Rehab Manager Cephus RicherJohn Pastor and IRT case manager Tish MenLinda Sayers around client engagement. Decision was made to continue phone engagement and to set a flag on [facility]'s [facility] requiring client to speak with Tish MenLinda Sayers or Dennison NancyPatrick Salvatore Poe prior to receiving medication.    Called client to touch bases and engage. No answer. Left a VM with contact information requesting return call to discuss further client engagement in [facility] services.

## 2015-01-01 ENCOUNTER — Encounter (HOSPITAL_BASED_OUTPATIENT_CLINIC_OR_DEPARTMENT_OTHER): Payer: Self-pay | Admitting: Counselor

## 2015-01-01 NOTE — Progress Notes (Signed)
DOCUMENT TYPE: Social Work - Follow-up Note    PATIENT: Patrick Davis, Patrick Davis [id number]  DOB: 1961-03-11    ENCOUNTER DATE:  01/01/2015    ASSOCIATED PROGRAM-CLINIC:  [facility]    CONTACT TYPE:  Face to Face - Office    BRIEF DESCRIPTION:  F/U re: meds, medical apts    PATIENT DESCRIPTION, PRESENTING PROBLEM, PATIENT / FAMILY GOALS:  54 y/o, AA male Stage III recent relocation to establish care after being in [place] for a couple of years. Pt. is homeless and had been staying at the [facility].    INTERVENTION / OUTCOME / PLAN:  Probation officer paged and met with this pt in the lobby of [facility]. Writer discussed ARVs with the pt who reports he is about to run out. Pt reports that he last filled in [place]. Writer consulted Sheena from Pharmacy billing who was able to fill his meds, writer showed her which meds from his current pill bottles to confirm that we have the proper regimen.     Writer informed pt that his [facility] CM would like to meet with him per epic note. Pt reports that he is almost out of his [place] meds. Writer advised him to go to [facility] from [place] on this date. Pt agreeable.    DOCUMENT COMPOSED BY:   Elly Modena, MSW [phone] on 01/01/2015

## 2015-01-02 ENCOUNTER — Ambulatory Visit (HOSPITAL_BASED_OUTPATIENT_CLINIC_OR_DEPARTMENT_OTHER): Payer: No Typology Code available for payment source | Admitting: Addiction (Substance Use Disorder)

## 2015-01-02 ENCOUNTER — Telehealth (HOSPITAL_BASED_OUTPATIENT_CLINIC_OR_DEPARTMENT_OTHER): Payer: Self-pay | Admitting: Clinical
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
## 2015-01-02 NOTE — Progress Notes (Signed)
[facility] Case Management Note      PRESENTATION:  Scheduled appointment with new client. Introduced to staff in [facility] and explained services. Started paperwork and scheduled appointments with Dr. Brenner, Martha and TW.Client interested in getting a Reduced Fare Bus Pass next month. "I sent almost all my money this month to my grandson in [place]." Client has an ID from [place]. He wants to get a [place] state ID before his ID expires. TW suggested he meet with Dottie. TW offered to set up an appointment. TW confirmed that the client hasn't run out of meds yet.     TREATMENT PLAN PROBLEMS ADDRESSED:  Basic & Self-Care Needs Not Met  High Risk for Harmful Behaviors  Medical/Dental Problems  Psychiatric Symptomatology  Substance Abuse  Vocational/Educational/Activity Skills are Deficient  Housing    TREATMENT PLAN INTERVENTIONS:   Started working on Recovery Plan.   Scheduled appointments with Dr. Brenner, Martha/Housing and TW.   Answered client's questions about why it takes so long to get housing.  Introduced to staff in [facility].   Discussed services available.  TW reminded client about his appointment on 01/06/15 with Dr. Poeschla.        EVALUATION:  New client. Focussed on getting housing as soon as possible. He may not wait for housing through [facility] Client is meeting with a housing contact at [facility] today.       Electronically signed by  Linda Sayers, MA CDP  [facility]  [id number]  [place] [address]

## 2015-01-02 NOTE — Telephone Encounter (Signed)
Called [facility] where client is staying and spoke with on call case manager. Informed him that client had completed an intake with [facility] and that we have not seen him since his initial psychiatry appointment. Discussed care coordination and left message communicating client's schedule appointment with his assigned IRT case manager, Tish MenLinda Sayers, as well as his upcoming appointment with his current psychiatrist, Dr. Kennyth ArnoldPoeschla, on June 7th at 1pm. Case manager stated that he would communicate information around necessary engagement and his upcoming psychiatry appointment. He will also call the [facility] and attempt to communicate information around client's appointment today. Provided contact information for Tish MenLinda Sayers and encouraged further care coordination as appropriate.

## 2015-01-06 ENCOUNTER — Telehealth (HOSPITAL_BASED_OUTPATIENT_CLINIC_OR_DEPARTMENT_OTHER): Payer: Self-pay | Admitting: Addiction (Substance Use Disorder)

## 2015-01-06 ENCOUNTER — Ambulatory Visit (HOSPITAL_BASED_OUTPATIENT_CLINIC_OR_DEPARTMENT_OTHER): Payer: No Typology Code available for payment source | Admitting: Psychiatry

## 2015-01-06 NOTE — Telephone Encounter (Signed)
TW clarified that the client is transitioning to long term mental health services on the Intensive Rehabilitation Team and is scheduled to meet with TW, his casemanager on 01/07/15, housing staff on 01/19/15 and his IRT psychiatrist, Dr. Darnelle BosBrenner on 02/03/15.

## 2015-01-07 ENCOUNTER — Ambulatory Visit (HOSPITAL_BASED_OUTPATIENT_CLINIC_OR_DEPARTMENT_OTHER): Payer: No Typology Code available for payment source | Attending: Infectious Disease | Admitting: Infectious Disease

## 2015-01-07 ENCOUNTER — Encounter (HOSPITAL_BASED_OUTPATIENT_CLINIC_OR_DEPARTMENT_OTHER): Payer: Self-pay | Admitting: Infectious Disease

## 2015-01-07 ENCOUNTER — Ambulatory Visit (HOSPITAL_BASED_OUTPATIENT_CLINIC_OR_DEPARTMENT_OTHER): Payer: No Typology Code available for payment source | Admitting: Addiction (Substance Use Disorder)

## 2015-01-07 VITALS — BP 99/65 | HR 97 | Temp 98.6°F | Resp 18 | Wt 158.4 lb

## 2015-01-07 DIAGNOSIS — Z21 Asymptomatic human immunodeficiency virus [HIV] infection status: Secondary | ICD-10-CM | POA: Insufficient documentation

## 2015-01-07 DIAGNOSIS — F17210 Nicotine dependence, cigarettes, uncomplicated: Secondary | ICD-10-CM

## 2015-01-07 DIAGNOSIS — J449 Chronic obstructive pulmonary disease, unspecified: Secondary | ICD-10-CM

## 2015-01-07 DIAGNOSIS — B20 Human immunodeficiency virus [HIV] disease: Secondary | ICD-10-CM

## 2015-01-07 MED ORDER — ELVITEG-COBIC-EMTRICIT-TENOFDF 150-150-200-300 MG OR TABS
1.0000 | ORAL_TABLET | Freq: Every day | ORAL | Status: DC
Start: 2015-01-07 — End: 2015-01-10

## 2015-01-07 NOTE — Patient Instructions (Signed)
Please have labs done before we see you next time. Same day OK.

## 2015-01-07 NOTE — Progress Notes (Signed)
[facility] Visit    ID/CC: Juanna CaoKent Leon Harter is a 54 year old year old male with Stage 3  HIV( CD4 414, VL und) here for f/u pulmonary infection.      HPI:   Here for f/u after being hospitalized for an influenza-like illness with SIRS, no pathogen recovered. He was d/c'd after 24hrs.  Today he feels back to 100% of his usual.  However we discuss in depth about smoking, likely COPD, and recurrent infections.  Has + cough daily with clear phlegm. Becomes SOB and has to stop due to breathing after 2 flights or any hill walking.  Does not have wheezing. Doesn't have an inhaler but maybe had one in the past, doesn't want one now. Has never had PFTs.  Knows he's had allergy to cats in the past.  Smoking 10cigs/day and is precontemplative- once quit for 9 mos while in jail, but started immediately afterward- feels it calms him, not interested in quitting right now but we discuss pathophys of lung issues wrt: smoking and he'll consider.  Has no CP or MSK pain limiting activity otherwise.    Feels mental health is stable.      He would like to have a 1 pill/day HIV regimen.      Problem List:  Patient Active Problem List    Diagnosis Date Noted   . Influenza-like illness [R69] 12/10/2014   . 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 IGRA neg 2016     . Reactive airway disease [J45.909] 12/10/2014     Asthma + allergies by hx, vs COPD (emphysema on CT). Hx wheezing and recurrent PNA. + smoker for many years     . Schizophrenia (HCC) [F20.9] 11/26/2014     Per patient first diagnosed when a teenager.      . Acute kidney injury (HCC) [N17.9]      TX FROM [facility]       . Healthcare-associated pneumonia [J18.9]      TX FROM [facility]       . Human immunodeficiency virus infection (HCC) [Z21]      Hx of PJP, diagnosis in 1990s. On TRU/ATV/r       . Pneumonia [J18.9]      TX FROM [facility]             ROS  A full review of systems was performed and was negative except as  documented in the HPI.    Medications:  Current Outpatient Prescriptions   Medication Sig Dispense Refill   . Clotrimazole 1 % External Cream Apply to affected area on leg(s) 2 times a day. For fungal rash. 45 g 2   . Elviteg-Cobic-Emtricit-TenofDF 150-150-200-300 MG Oral Tab Take 1 tablet by mouth daily. Take with food. 90 tablet 3   . QUEtiapine Fumarate 400 MG Oral Tab Take 2 tablets (800 mg) by mouth at bedtime. 60 tablet 6   . Ritonavir 100 MG Oral Tab Take 1 tablet (100 mg) by mouth daily. 30 tablet 11     No current facility-administered medications for this visit.       Allergies:  Review of patient's allergies indicates:  No Known Allergies    Social History:  History     Social History   . Marital Status: Single     Spouse Name: N/A   . Number of Children: N/A   . Years of Education: N/A     Occupational History   .  Not on file.     Social History Main Topics   . Smoking status: Current Every Day Smoker   . Smokeless tobacco: Not on file   . Alcohol Use: Not on file   . Drug Use: Not on file   . Sexual Activity: Not on file     Other Topics Concern   . Not on file     Social History Narrative       Family History:  No family history on file.    Physical Exam:  Vital Signs: BP 99/65 mmHg  Pulse 97  Temp(Src) 98.6 F (37 C) (Temporal)  Resp 18  Wt 158 lb 6.4 oz (71.85 kg)  SpO2 95%, There is no height on file to calculate BMI.       Gen: well-appearing,but, flat affect and slow speech but appropriate  HEENT: op clear, mmm, sclera anicteric  CV: RRR no r/m/g, loud S2  Pulm: ctab no w/r/r though possibly decreased lung sounds  Skin: no rash      Labs:   HIV  Results for orders placed or performed in visit on 11/26/14   T CELL SUBSET _ CD4 & CD8 ONLY   Result Value Ref Range    % CD4 29 (L) 33 - 61 %    Abs CD4 Lymphocyte Cnt 0.414 (L) 0.73 - 2.25 10*3/uL    % CD8 49 (H) 14 - 35 %    Abs CD8 Lymphocyte Cnt 0.705 0.25 - 1.24 10*3/uL    CD4/CD8 Ratio 0.59 (L) 1.00 - 3.78    Test Comment       This test was  developed and its performance characteristics determined by [facility], Department of Laboratory Medicine. It has not been cleared or approved by the U.S. Food and Drug Administration.     Results for orders placed or performed in visit on 11/26/14   HIV1 RNA QUANTITATION   Result Value Ref Range    HIV RNA Specimen Plasma     HIV RNA Result <40 (A) NDET [copies]/mL    HIV RNA Copies/mL (Log10) <1.60     HIV RNA Interp       HIV-1 RNA was detected in this sample but below the assay's limit of quantitation.  The analytic range for the Abbott RealTime HIV-1 RNA assay is 40 to 10,000,000 copies/mL.               Assessment and Plan:  Eliu was seen today for follow-up .    Diagnoses and all orders for this visit:    Chronic obstructive pulmonary disease, unspecified COPD type (HCC)- he declines any rescue inhaler but is willing to see what PFTs say and whether we should consider a daily tx (spiriva vs LABA+)  Orders:  -     REFERRAL TO PFT DIAGNOSTIC LAB    HIV (human immunodeficiency virus infection) (HCC)- to have 1 pill per day, will make the switch and recheck VL and safety in 6 weeks.  I will alert his psych MD as there is possible differential inhibition of seroquel clearance with boosted PI vs cobi, though not enough data.  Because recommendations for concomitant dosing are the same (1/6 normal dose) we'll just keep same seroquel dose and monitor per psych MD  Orders:  -     Elviteg-Cobic-Emtricit-TenofDF 150-150-200-300 MG Oral Tab; Take 1 tablet by mouth daily. Take with food.  -     BASIC METABOLIC PANEL  -     HIV1 RNA QUANTITATION  Smoking:  Precontemplative. We spent 1/2 the visit discussing benefits of smoking cessation, his Spo2 of 95%, clubbed nails, and likely reduction in infections if he stops.    HCM  Immunizations:   Immunization History   Administered Date(s) Administered   . pneumococcal (Pneumovax 23) polysaccharide vaccine 09/15/2012

## 2015-01-07 NOTE — Progress Notes (Signed)
[facility]-Outpatient Progress Note    12/24/2014    Reason for Visit:  Chief Complaint   Patient presents with   . Follow-Up    . Care Coordination       Subjective:  Presents to the nurse asking about getting refills on his medications.  Brought the pills to me   and he has been taking them as prescribed.  He would be out one day before his next appt.  Told him I would   call and help with the refills, also informed him he had an appt with Patrick Davis the day before that he missed.  [name] was unaware and I will call to reschedule.  Patient was in agreement with the plan and thanked me for   my help.       [facility]    [facility]    Living Arrangements: homeless    Pt's PCP:  Patrick Davis, Patrick Fastachel A, MD    Vitals:  There were no vitals taken for this visit.    ROS: identified by name and birth date   Constitutional: disheveled   Eyes: eye patch   Ears, Nose, Mouth, Throat: no c/o   Cardiovascular: denies any chest pain   Respiratory: denies any shortness of breath or cough   Gastrointestinal: no c/o   Genitourinary:  No c/o   Musculoskeletal: steady gait, denies any falls   Skin: na   Neurological:alert and oriented   Psychiatric: very little eye contact, cooperative and appropriate      Referrals: yes    Medications provided per protocol: na    Plan:  Check back with the nurse for an update on medication and appts  Patient voiced understanding and agreement      Electronically signed:  Carmell AustriaKaren Adler RN

## 2015-01-08 ENCOUNTER — Telehealth (HOSPITAL_BASED_OUTPATIENT_CLINIC_OR_DEPARTMENT_OTHER): Payer: Self-pay

## 2015-01-08 DIAGNOSIS — B20 Human immunodeficiency virus [HIV] disease: Secondary | ICD-10-CM

## 2015-01-08 NOTE — Telephone Encounter (Signed)
Prior Authorization Request Pending    Prescription Insurance: UHC    Medication: Stribild    Directions: QD    Quantity: 30    Day supply: 30    Duration of Therapy:  indefinite     Pharmacy: [facility]    Date Submitted: 01/08/15    Type of Request: (write contact number)    [x  ] Fax:  [phone]     [  ] Telephone:    [  ] Other:     Please allow up to 83 business horus for review/response.

## 2015-01-09 NOTE — Telephone Encounter (Addendum)
Prior Authorization Denial    Prescription Insurance: UHC     The patient's insurance has denied coverage of the following medication:    Medication: Stribild    Directions: 1 tab daily    Quantity: 30    Day supply: 30    Duration of Therapy:  indefinite     Pharmacy: [facility]    Reason for Denial: Must try and fail Triumeq    Denial # or code: [id number]    Method of response: (write contact number)    [ X ] Fax: [phone]    [  ] Telephone:    [  ] Other:       Plan:    [  ] An appeal of reconsideration request will be initiated      [ X ] Recommend changing to an alternative formulary medication:    (The prescription can be entered using this current telephone encounter)     [  ] Other:       Additional Comments:  (All pertinent documents scanned into EPIC. See "Media" tab to view documents)    Patient currently on regimen of Truvada + Atazanavir + ritonavir.  Due to preference for 1 tablet daily regimen, Dr. Jari Favre and the patient change to Stribild.  Pt must try Triumeq prior to Stribild.      Will defer to Dr. Jari Favre on the ARV regimen selection.    Can also consider:  Triumeq (abacavir/lamivudine/dolutegravir)  Truvada + dolutegravir (although this is not 1 pill regimen)    Of note: patient is on very high dose of quetiapine in the setting of atazanavir/ritonavir which can increase quetiapine levels significantly and DHHS HIV guidelines recommend to decrease quetiapine dose by 1/6 of normal dose.    Left a message for patient to notifying him on the prior authorization denial      Thanks.  Triumeq is fine.  HLA [id number] negative as of 2014.    Please note that I did not dose reduce the quetiapine because he has been on those doses with a boosted PI for a long time and is stable on that regimen.  I also would not reduce now that changing to triumeq.

## 2015-01-10 MED ORDER — ABACAVIR-DOLUTEGRAVIR-LAMIVUD 600-50-300 MG OR TABS
1.0000 | ORAL_TABLET | Freq: Every day | ORAL | Status: DC
Start: 2015-01-10 — End: 2015-02-18

## 2015-01-12 ENCOUNTER — Telehealth (HOSPITAL_BASED_OUTPATIENT_CLINIC_OR_DEPARTMENT_OTHER): Payer: Self-pay | Admitting: Addiction (Substance Use Disorder)

## 2015-01-12 NOTE — Telephone Encounter (Signed)
TW called Carmell Austria, RN at [facility] where client has been staying, for an update. TW left message stating that client hadn't followed through with his appointment with Dr. Kennyth Arnold on 6/7 or with  TW on 6/8. TW asked Clydie Braun to return her call.

## 2015-01-19 ENCOUNTER — Ambulatory Visit (HOSPITAL_BASED_OUTPATIENT_CLINIC_OR_DEPARTMENT_OTHER): Payer: No Typology Code available for payment source | Admitting: Clinical

## 2015-01-19 ENCOUNTER — Encounter (HOSPITAL_BASED_OUTPATIENT_CLINIC_OR_DEPARTMENT_OTHER): Payer: Self-pay | Admitting: Counselor

## 2015-01-19 NOTE — Progress Notes (Signed)
DOCUMENT TYPE: Social Work - Follow-up Note    PATIENT: Patrick Davis, Patrick Davis [id number]  DOB: 07/30/1961    ENCOUNTER DATE:  01/19/2015    ASSOCIATED PROGRAM-CLINIC:  [facility]    CONTACT TYPE:  Collateral    BRIEF DESCRIPTION:  Housing Navigator referral    PATIENT DESCRIPTION, PRESENTING PROBLEM, PATIENT / FAMILY GOALS:  54 y/o, AA male Stage III recent relocation to establish care after being in [place] for a couple of years. Pt. is homeless and had been staying at the [facility].    INTERVENTION / OUTCOME / PLAN:  Clinical research associate completed a Community education officer referral on behalf of this pt today.    DOCUMENT COMPOSED BY:   Betsey Amen, MSW [phone] on 01/19/2015

## 2015-01-20 ENCOUNTER — Ambulatory Visit: Payer: No Typology Code available for payment source

## 2015-01-26 ENCOUNTER — Ambulatory Visit (HOSPITAL_BASED_OUTPATIENT_CLINIC_OR_DEPARTMENT_OTHER): Payer: No Typology Code available for payment source | Admitting: Addiction (Substance Use Disorder)

## 2015-01-26 ENCOUNTER — Ambulatory Visit (HOSPITAL_BASED_OUTPATIENT_CLINIC_OR_DEPARTMENT_OTHER): Payer: No Typology Code available for payment source | Attending: Infectious Disease

## 2015-01-26 ENCOUNTER — Encounter (HOSPITAL_BASED_OUTPATIENT_CLINIC_OR_DEPARTMENT_OTHER): Payer: Self-pay | Admitting: Counselor

## 2015-01-26 DIAGNOSIS — J449 Chronic obstructive pulmonary disease, unspecified: Secondary | ICD-10-CM | POA: Insufficient documentation

## 2015-01-26 NOTE — Progress Notes (Signed)
DOCUMENT TYPE: Social Work - Follow-up Note    PATIENT: Patrick Davis, Patrick Davis [id number]  DOB: 22-Sep-1960    ENCOUNTER DATE:  01/26/2015    ASSOCIATED PROGRAM-CLINIC:  [facility]    CONTACT TYPE:  Face to Face - Office    BRIEF DESCRIPTION:  Housing/shelter    PATIENT DESCRIPTION, PRESENTING PROBLEM, PATIENT / FAMILY GOALS:  54 y/o, AA male Stage III recent relocation to establish care after being in [place] for a couple of years. Pt. is homeless and had been staying at the [facility].    INTERVENTION / OUTCOME / PLAN:  Probation officer paged and met with the pt in the lobby of [facility]. Pt is requesting assistance in trying to find a room to rent, reports that his SSI will be cut to $400 per month if he doesn't "show that I'm paying rent" somewhere. Writer unaware of such a restriction but worked to assist him in finding an alternative to [place] and pt phoned Leone Brand [phone] who rents rooms in a number of homes that he owns, does not do any kind of background check. Writer made sure the pt was aware that this is not a supervised setting and essentially anyone can rent from him. Nicole Kindred reports that he has a room for $400 but wants an additional $400 to move in as a deposit. Pt was unwilling/unable to pay that and declined. Pt was headed to [facility] to meet with a Case worker there as well. Writer encouraged [facility] which [facility] be an option for him and may be more comfortable than [facility].     Will continue to F/U    DOCUMENT COMPOSED BY:   Elly Modena, MSW [phone] on 01/26/2015
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
## 2015-01-28 ENCOUNTER — Telehealth (HOSPITAL_BASED_OUTPATIENT_CLINIC_OR_DEPARTMENT_OTHER): Payer: Self-pay | Admitting: Addiction (Substance Use Disorder)

## 2015-01-28 NOTE — Telephone Encounter (Signed)
TW called [name], MS[facility] in [facility] to ask him to notify the client, if he sees him, that his insurance will not approve a refill of his Quetiapine until 01/31/15. His med[facility].

## 2015-01-28 NOTE — Progress Notes (Signed)
[facility]      ID/CC: Patrick CaoKent Leon Davis is a 54 year old year old male with Stage 3 HIV( CD4 414, VL und) who sees Dr. Hessie DienerBender, just moving back from [place]. Has appt with Dr. Hessie DienerBender 5/11 to re-establish care but was sent here from ER where he was seen earlier today due to running out of medications for refill of medications.    States he has been feeling well. Had a cold or some flu-like symptoms last week with in particular decreased appetite and feeling "bad" but now resolved.       Soc: Staying in shelter, just arrived from [place], smoking 1/2 PPD, no ETOH while on the seroquel, occasional marijuana.      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       Medications:  Atazanavir  Ritonavir  truvada  Quetiapine      Allergies: NKDA        Physical Exam:  BP 115/81 mmHg  Pulse 88  Temp(Src) 97.5 F (36.4 C)  Resp 16  Wt 154 lb (69.854 kg)  Poor dentition  Patch L eye  CTA  RRR  Dry thickened skin buttocks    No recent labs, just moved back from [place].        Assessment and Plan:  Patrick Davis was seen today for refills so would not run out of medications before he reestablished care with Dr. Hessie DienerBender in a few weeks.  -will give refills of HIV medications today  -will give quetiapine refill today unless already done by ER  -will order basic HIV labs today so results will be available when he sees Dr. Hessie DienerBender  -will order STD screen today         F/u with Dr. Hessie DienerBender already scheduled.

## 2015-01-28 NOTE — Telephone Encounter (Signed)
TW left message for Patrick Davis at [facility]. TW met with client on 01/26/15. Client requested refill for  01/29/15. TW spoke to the the pharmacist. Client's insurance will not approve refill until 01/31/15. TW asked Patrick Davis to inform client of this. Medication will be available for pick up on 01/31/15.

## 2015-01-29 ENCOUNTER — Telehealth (HOSPITAL_BASED_OUTPATIENT_CLINIC_OR_DEPARTMENT_OTHER): Payer: Self-pay

## 2015-01-29 DIAGNOSIS — B20 Human immunodeficiency virus [HIV] disease: Secondary | ICD-10-CM

## 2015-01-30 ENCOUNTER — Ambulatory Visit (HOSPITAL_BASED_OUTPATIENT_CLINIC_OR_DEPARTMENT_OTHER): Payer: No Typology Code available for payment source | Admitting: Addiction (Substance Use Disorder)

## 2015-01-30 NOTE — Telephone Encounter (Signed)
DOCUMENT TYPE: Social Work - Housing Note    PATIENT: Patrick Davis, Patrick Davis [id number]  DOB: 02/10/1961    ENCOUNTER DATE:  01/29/2015    ASSOCIATED PROGRAM-CLINIC:  [facility]    CONTACT TYPE:  Attempted    BRIEF DESCRIPTION:  2nd Attempt to schedule Housing Intake    PATIENT DESCRIPTION, PRESENTING PROBLEM, PATIENT / FAMILY GOALS:  AA male, moved here from [place], Psychiatric hx, not currently taking medication.    Attempted housing intake    INTERVENTION / OUTCOME / PLAN:  INTERVENTION:    Second attempt at scheduling housing intake. First attempt 6/22. Left VM. I looked clt up on EPIC and saw he has an appointment with MH at 2:00pm tomorrow, 7/1    PLAN:    Put a note on the appointment, requesting possibility [facility] for [facility] navigation services. If they do not show up, will continue to call    VISIT RELATED TO DOMESTIC VIOLENCE?  No    IS PATIENT HOMELESS?  Yes    CURRENTLY INCARCERATED / WORK RELEASE?  Unknown    ISSUE ADDRESSED: (HOUSING)  Other    SOCIAL WORK SERVICES PROVIDED:  In English    DOCUMENT COMPOSED BY:   Elston Aldape on 01/30/2015

## 2015-02-03 ENCOUNTER — Encounter (HOSPITAL_BASED_OUTPATIENT_CLINIC_OR_DEPARTMENT_OTHER): Payer: No Typology Code available for payment source | Admitting: Psychiatry

## 2015-02-03 NOTE — Progress Notes (Signed)
[name] did not cancel and was not present for a scheduled appointment today.

## 2015-02-04 ENCOUNTER — Telehealth (HOSPITAL_BASED_OUTPATIENT_CLINIC_OR_DEPARTMENT_OTHER): Payer: Self-pay

## 2015-02-06 ENCOUNTER — Encounter (HOSPITAL_BASED_OUTPATIENT_CLINIC_OR_DEPARTMENT_OTHER): Payer: Self-pay

## 2015-02-09 NOTE — Telephone Encounter (Signed)
DOCUMENT TYPE: Social Work - Housing Note    PATIENT: Patrick Davis, Patrick Davis [id number]  DOB: 06/15/1961    ENCOUNTER DATE:  02/04/2015    ASSOCIATED PROGRAM-CLINIC:  [facility]    CONTACT TYPE:  Telephone    BRIEF DESCRIPTION:  Connecting for Intake appt    PATIENT DESCRIPTION, PRESENTING PROBLEM, PATIENT / FAMILY GOALS:  AA male, moved here from [place], Psychiatric hx, not currently taking medication.    Connected with client to make intake appointment    INTERVENTION / OUTCOME / PLAN:  INTERVENTION:    After some missed connections, Housing Navigator was able to get in contact with Client via telephone and asked when they were available to come in for a housing intake. The client stated that they thought there was an appointment they had missed today. HN looked this up at the request of the client and confirmed that they did miss an appointment in clinic. Client and HN made an appointment in clinic for 7/8 at 3:00pm. Client requested HN call them the day before to remind them of their appointment.    PLAN:    HN will call Client no later than 4:00 pm on 7/7 to remind client of appt. on 7/8.    At this appointment, Client will be assessed for CEA.    VISIT RELATED TO DOMESTIC VIOLENCE?  No    IS PATIENT HOMELESS?  Yes    CURRENTLY INCARCERATED / WORK RELEASE?  No    ISSUE ADDRESSED: (HOUSING)  Other    SOCIAL WORK SERVICES PROVIDED:  In English    DOCUMENT COMPOSED BY:   Nickalous Stingley on 02/09/2015

## 2015-02-11 ENCOUNTER — Encounter (HOSPITAL_BASED_OUTPATIENT_CLINIC_OR_DEPARTMENT_OTHER): Payer: Self-pay | Admitting: Counselor
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
## 2015-02-18 ENCOUNTER — Encounter (HOSPITAL_BASED_OUTPATIENT_CLINIC_OR_DEPARTMENT_OTHER): Payer: Self-pay | Admitting: Counselor

## 2015-02-18 ENCOUNTER — Ambulatory Visit (HOSPITAL_BASED_OUTPATIENT_CLINIC_OR_DEPARTMENT_OTHER): Payer: No Typology Code available for payment source | Attending: Infectious Disease | Admitting: Infectious Disease

## 2015-02-18 VITALS — BP 119/71 | HR 99 | Temp 97.7°F | Wt 153.0 lb

## 2015-02-18 DIAGNOSIS — B20 Human immunodeficiency virus [HIV] disease: Secondary | ICD-10-CM

## 2015-02-18 DIAGNOSIS — J189 Pneumonia, unspecified organism: Secondary | ICD-10-CM

## 2015-02-18 DIAGNOSIS — Z21 Asymptomatic human immunodeficiency virus [HIV] infection status: Secondary | ICD-10-CM | POA: Insufficient documentation

## 2015-02-18 DIAGNOSIS — J410 Simple chronic bronchitis: Secondary | ICD-10-CM | POA: Insufficient documentation

## 2015-02-18 MED ORDER — UMECLIDINIUM BROMIDE 62.5 MCG/ACT IN AEPB
1.0000 | INHALATION_SPRAY | Freq: Every day | RESPIRATORY_TRACT | Status: DC
Start: 2015-02-18 — End: 2015-05-06

## 2015-02-18 MED ORDER — ABACAVIR-DOLUTEGRAVIR-LAMIVUD 600-50-300 MG OR TABS
1.0000 | ORAL_TABLET | Freq: Every day | ORAL | Status: AC
Start: 2015-02-18 — End: ?

## 2015-02-18 MED ORDER — TIOTROPIUM BROMIDE MONOHYDRATE 18 MCG IN CAPS
18.0000 ug | ORAL_CAPSULE | Freq: Every day | RESPIRATORY_TRACT | Status: DC
Start: 2015-02-18 — End: 2015-02-18
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
## 2015-02-18 NOTE — Patient Instructions (Signed)
Try the new inhaler daily (in the morning) to help with your breathing    Please  Have labs done before next appointment around 3 months

## 2015-02-19 ENCOUNTER — Encounter (HOSPITAL_BASED_OUTPATIENT_CLINIC_OR_DEPARTMENT_OTHER): Payer: Self-pay | Admitting: Counselor

## 2015-02-19 ENCOUNTER — Telehealth (HOSPITAL_BASED_OUTPATIENT_CLINIC_OR_DEPARTMENT_OTHER): Payer: Self-pay | Admitting: Addiction (Substance Use Disorder)
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
## 2015-02-19 NOTE — Telephone Encounter (Signed)
TW called Patrick Davis to ask for update as the client missed his appointment with TW on 01/30/15 and his initial psychiatric evaluation with Dr. Darnelle Bos on 02/03/15. Client picked up a refill on 02/06/15. TW asked Patrick Davis to notify the client that an appointment has been scheduled for him on 03/10/15 at 11:00 AM to check in with Drenda Freeze, RN before he picks up his meds on 03/10/15. Also his psychiatric evaluation with Dr. Darnelle Bos had been rescheduled for 03/17/15 at 11:00 AM.
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
## 2015-03-01 DIAGNOSIS — B20 Human immunodeficiency virus [HIV] disease: Secondary | ICD-10-CM

## 2015-03-09 ENCOUNTER — Telehealth (HOSPITAL_BASED_OUTPATIENT_CLINIC_OR_DEPARTMENT_OTHER): Payer: Self-pay | Admitting: Addiction (Substance Use Disorder)

## 2015-03-09 NOTE — Telephone Encounter (Signed)
TW called Carmell Austria, RN at [facility], where client stays. TW asked Clydie Braun to remind client that he has an appointment with Levonne Hubert RN tomorrow at 11:00 AM  before he picks up his meds. Clydie Braun confirmed that the client continues to use the shelter and that she has seen him within the last couple of days.

## 2015-03-10 ENCOUNTER — Ambulatory Visit (HOSPITAL_BASED_OUTPATIENT_CLINIC_OR_DEPARTMENT_OTHER): Payer: No Typology Code available for payment source | Admitting: Psychiatry
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
## 2015-03-17 ENCOUNTER — Encounter (HOSPITAL_BASED_OUTPATIENT_CLINIC_OR_DEPARTMENT_OTHER): Payer: No Typology Code available for payment source | Admitting: Psychiatry

## 2015-03-17 NOTE — Progress Notes (Signed)
Patrick Davis did not cancel and was not present for a scheduled appointment today.

## 2015-03-31 ENCOUNTER — Ambulatory Visit (HOSPITAL_BASED_OUTPATIENT_CLINIC_OR_DEPARTMENT_OTHER): Payer: No Typology Code available for payment source | Attending: Psychiatry | Admitting: Psychiatry

## 2015-03-31 DIAGNOSIS — F631 Pyromania: Secondary | ICD-10-CM | POA: Insufficient documentation

## 2015-03-31 DIAGNOSIS — F1410 Cocaine abuse, uncomplicated: Secondary | ICD-10-CM

## 2015-03-31 DIAGNOSIS — F209 Schizophrenia, unspecified: Secondary | ICD-10-CM | POA: Insufficient documentation

## 2015-03-31 DIAGNOSIS — F79 Unspecified intellectual disabilities: Secondary | ICD-10-CM

## 2015-03-31 DIAGNOSIS — F1010 Alcohol abuse, uncomplicated: Secondary | ICD-10-CM | POA: Insufficient documentation

## 2015-03-31 DIAGNOSIS — F12959 Cannabis use, unspecified with psychotic disorder, unspecified: Secondary | ICD-10-CM | POA: Insufficient documentation

## 2015-03-31 MED ORDER — QUETIAPINE FUMARATE 400 MG OR TABS
800.0000 mg | ORAL_TABLET | Freq: Every evening | ORAL | Status: DC
Start: 2015-03-31 — End: 2015-10-29

## 2015-03-31 MED ORDER — MIRTAZAPINE 15 MG OR TABS
15.0000 mg | ORAL_TABLET | Freq: Every evening | ORAL | Status: DC
Start: 2015-03-31 — End: 2015-07-02

## 2015-03-31 NOTE — Patient Instructions (Signed)
Thank you for coming in today. During today's visit we reviewed only your psychiatric (mental health) medications. Please follow up with your primary care provider for any questions regarding other medications.        F/u with Cathryn RN in 2 weeks
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
## 2015-04-02 ENCOUNTER — Encounter (HOSPITAL_BASED_OUTPATIENT_CLINIC_OR_DEPARTMENT_OTHER): Payer: Self-pay | Admitting: Psychiatry

## 2015-04-02 DIAGNOSIS — F1410 Cocaine abuse, uncomplicated: Secondary | ICD-10-CM | POA: Insufficient documentation

## 2015-04-02 DIAGNOSIS — F631 Pyromania: Secondary | ICD-10-CM | POA: Insufficient documentation

## 2015-04-02 DIAGNOSIS — F79 Unspecified intellectual disabilities: Secondary | ICD-10-CM | POA: Insufficient documentation

## 2015-04-02 DIAGNOSIS — F1010 Alcohol abuse, uncomplicated: Secondary | ICD-10-CM | POA: Insufficient documentation

## 2015-04-02 DIAGNOSIS — F12959 Cannabis use, unspecified with psychotic disorder, unspecified: Secondary | ICD-10-CM | POA: Insufficient documentation

## 2015-04-07 ENCOUNTER — Encounter (HOSPITAL_BASED_OUTPATIENT_CLINIC_OR_DEPARTMENT_OTHER): Payer: No Typology Code available for payment source | Admitting: Psychiatry

## 2015-04-07 NOTE — Progress Notes (Signed)
Mr. Bignell did not cancel and was not present for a scheduled appointment today.

## 2015-04-11 ENCOUNTER — Emergency Department (HOSPITAL_BASED_OUTPATIENT_CLINIC_OR_DEPARTMENT_OTHER)
Admission: EM | Admit: 2015-04-11 | Discharge: 2015-04-11 | Disposition: A | Discharge status: Home / Self Care | Payer: No Typology Code available for payment source | Attending: Emergency Medicine | Admitting: Emergency Medicine

## 2015-04-11 ENCOUNTER — Other Ambulatory Visit: Payer: Self-pay | Admitting: Physician Assistant

## 2015-04-11 DIAGNOSIS — E860 Dehydration: Secondary | ICD-10-CM | POA: Insufficient documentation

## 2015-04-11 DIAGNOSIS — R0602 Shortness of breath: Secondary | ICD-10-CM | POA: Insufficient documentation

## 2015-04-11 DIAGNOSIS — R05 Cough: Secondary | ICD-10-CM | POA: Insufficient documentation

## 2015-04-11 DIAGNOSIS — J189 Pneumonia, unspecified organism: Secondary | ICD-10-CM | POA: Insufficient documentation

## 2015-04-11 LAB — URINALYSIS WITH REFLEX CULTURE
Bacteria, URN: NONE SEEN
Bilirubin (Qual), URN: NEGATIVE
Epith Cells_Renal/Trans,URN: NEGATIVE /HPF
Epith Cells_Squamous, URN: NEGATIVE /LPF
Glucose Qual, URN: NEGATIVE mg/dL
Leukocyte Esterase, URN: NEGATIVE
Nitrite, URN: NEGATIVE
Occult Blood, URN: NEGATIVE
RBC, URN: NEGATIVE /HPF
Specific Gravity, URN: 1.018 g/mL (ref 1.002–1.027)
WBC, URN: NEGATIVE /HPF
pH, URN: 6.5 (ref 5.0–8.0)

## 2015-04-11 LAB — CBC, DIFF
% Basophils: 0 %
% Eosinophils: 0 %
% Immature Granulocytes: 0 %
% Lymphocytes: 10 %
% Monocytes: 8 %
% Neutrophils: 82 %
Absolute Eosinophil Count: 0.01 10*3/uL (ref 0.00–0.50)
Absolute Lymphocyte Count: 0.72 10*3/uL — ABNORMAL LOW (ref 1.00–4.80)
Basophils: 0.01 10*3/uL (ref 0.00–0.20)
Hematocrit: 41 % (ref 38–50)
Hemoglobin: 14.6 g/dL (ref 13.0–18.0)
Immature Granulocytes: 0.02 10*3/uL (ref 0.00–0.05)
MCH: 30.6 pg (ref 27.3–33.6)
MCHC: 35.4 g/dL (ref 32.2–36.5)
MCV: 86 fL (ref 81–98)
Monocytes: 0.6 10*3/uL (ref 0.00–0.80)
Neutrophils: 6.09 10*3/uL (ref 1.80–7.00)
Platelet Count: 174 10*3/uL (ref 150–400)
RBC: 4.77 10*6/uL (ref 4.40–5.60)
RDW-CV: 13.8 % (ref 11.6–14.4)
WBC: 7.45 10*3/uL (ref 4.30–10.00)

## 2015-04-11 LAB — INFLUENZA A, B AND RSV, RAPID PCR
Rapid Influenza A PCR Result: NEGATIVE
Rapid Influenza B PCR Result: NEGATIVE
Rapid RSV PCR Result: NEGATIVE

## 2015-04-11 LAB — BASIC METABOLIC PANEL
Anion Gap: 7 (ref 4–12)
Calcium: 9.5 mg/dL (ref 8.9–10.2)
Carbon Dioxide, Total: 25 meq/L (ref 22–32)
Chloride: 102 meq/L (ref 98–108)
Creatinine: 0.88 mg/dL (ref 0.51–1.18)
GFR, Calc, African American: >60 mL/min (ref >59)
GFR, Calc, European American: >60 mL/min (ref >59)
Glucose: 96 mg/dL (ref 62–125)
Potassium: 3.8 meq/L (ref 3.6–5.2)
Sodium: 134 meq/L — ABNORMAL LOW (ref 135–145)
Urea Nitrogen: 7 mg/dL — ABNORMAL LOW (ref 8–21)

## 2015-04-11 LAB — LACTATE DEHYDROGENASE: Lactate Dehydrogenase: 185 U/L (ref <210)

## 2015-04-22 ENCOUNTER — Encounter (HOSPITAL_BASED_OUTPATIENT_CLINIC_OR_DEPARTMENT_OTHER): Payer: No Typology Code available for payment source | Admitting: Infectious Disease

## 2015-04-24 ENCOUNTER — Telehealth (HOSPITAL_BASED_OUTPATIENT_CLINIC_OR_DEPARTMENT_OTHER): Payer: Self-pay | Admitting: Clinical Social Worker

## 2015-04-24 ENCOUNTER — Encounter (HOSPITAL_BASED_OUTPATIENT_CLINIC_OR_DEPARTMENT_OTHER): Payer: Self-pay | Admitting: Clinical Social Worker

## 2015-04-24 NOTE — Telephone Encounter (Signed)
DOCUMENT TYPE: Social Work - Follow-up Note    PATIENT: Salaam, Battershell [id number]  DOB: [birth year]    ENCOUNTER DATE:  04/23/2015    ASSOCIATED PROGRAM-CLINIC:  [facility]    CONTACT TYPE:  Telephone    BRIEF DESCRIPTION:  F/U re: No-show appt    PATIENT DESCRIPTION, PRESENTING PROBLEM, PATIENT / FAMILY GOALS:  54 y/o, AA male Stage III recent relocation to establish care after being in [place] for a couple of years. Pt. is homeless and had been staying at the [facility].    Epic notification from MD Princess Bruins Ignacio Pt no-showed 9/21 appointment and is requesting Pt be rescheduled.    INTERVENTION / OUTCOME / PLAN:  T/C to Pt at [phone], to check-in and f/u on 9/21 no-show appointment with MD Marygrace Drought. SW [employer] introduced self as Pt's new Sports coach at [employer]. Pt stated he confused 9/21 appointment with another one, which is why he no-showed. SW Pasol asked if he wanted to schedule next appointment, but declined stated he will walk-in after 9/27 appointment with MD Ashley Akin at [facility].     SW [place] and SW [place] consulted with Triage RN Byrd Hesselbach to schedule next available appointment with MD [facility]. Triage RN Byrd Hesselbach provided the following appointments: 10/5 at 2:45p, 3:15p, or 3:45p. SW Pasol will contact Pt and provide available appointments.    DOCUMENT COMPOSED BY:   Linnell Fulling, MSW on 04/24/2015

## 2015-04-27 ENCOUNTER — Encounter (HOSPITAL_BASED_OUTPATIENT_CLINIC_OR_DEPARTMENT_OTHER): Payer: Self-pay | Admitting: Clinical Social Worker

## 2015-04-27 NOTE — Progress Notes (Signed)
DOCUMENT TYPE: Social Work - Follow-up Note    PATIENT: Patrick Davis, [name]  DOB: [birth year]    ENCOUNTER DATE:  04/24/2015    ASSOCIATED PROGRAM-CLINIC:  [facility]    CONTACT TYPE:  Telephone    BRIEF DESCRIPTION:  Schedule appt    PATIENT DESCRIPTION, PRESENTING PROBLEM, PATIENT / FAMILY GOALS:  54 y/o, AA male Stage III. Pt is homeless and currently staying at [facility] shelter. PCP is MD Marygrace Drought and SW is [employer].    Schedule next PCP appointment as Pt no show 9/21 appointment.    INTERVENTION / OUTCOME / PLAN:  T/C to Pt at [phone], to provide available times with MD Marygrace Drought: 10/5 at 2:45p, 3:15p, or 3:45p. Pt selected 10/5 at 3:15p. SW Pasol scheduled appointment with Triage RN Byrd Hesselbach.     T/C to Pt at [phone] and left v/m stating appointment was schedule for time he requested.    DOCUMENT COMPOSED BY:   Linnell Fulling, MSW on 04/27/2015

## 2015-04-28 ENCOUNTER — Encounter (HOSPITAL_BASED_OUTPATIENT_CLINIC_OR_DEPARTMENT_OTHER): Payer: No Typology Code available for payment source | Admitting: Psychiatry

## 2015-04-28 NOTE — Progress Notes (Signed)
Encounter Open in Error

## 2015-04-28 NOTE — Progress Notes (Signed)
Patrick Davis did not cancel and was not present for a scheduled appointment today.

## 2015-05-01 DIAGNOSIS — B20 Human immunodeficiency virus [HIV] disease: Secondary | ICD-10-CM

## 2015-05-06 ENCOUNTER — Other Ambulatory Visit (HOSPITAL_BASED_OUTPATIENT_CLINIC_OR_DEPARTMENT_OTHER): Payer: Self-pay | Admitting: Infectious Disease

## 2015-05-06 ENCOUNTER — Encounter (HOSPITAL_BASED_OUTPATIENT_CLINIC_OR_DEPARTMENT_OTHER): Payer: Self-pay | Admitting: Infectious Disease

## 2015-05-06 ENCOUNTER — Ambulatory Visit (HOSPITAL_BASED_OUTPATIENT_CLINIC_OR_DEPARTMENT_OTHER): Payer: No Typology Code available for payment source | Attending: Infectious Disease | Admitting: Infectious Disease

## 2015-05-06 VITALS — BP 105/69 | HR 104 | Temp 96.4°F | Resp 16 | Ht 74.0 in | Wt 148.0 lb

## 2015-05-06 DIAGNOSIS — B20 Human immunodeficiency virus [HIV] disease: Secondary | ICD-10-CM

## 2015-05-06 DIAGNOSIS — B182 Chronic viral hepatitis C: Secondary | ICD-10-CM | POA: Insufficient documentation

## 2015-05-06 DIAGNOSIS — Z21 Asymptomatic human immunodeficiency virus [HIV] infection status: Secondary | ICD-10-CM | POA: Insufficient documentation

## 2015-05-06 DIAGNOSIS — J410 Simple chronic bronchitis: Secondary | ICD-10-CM | POA: Insufficient documentation

## 2015-05-06 DIAGNOSIS — Z1211 Encounter for screening for malignant neoplasm of colon: Secondary | ICD-10-CM | POA: Insufficient documentation

## 2015-05-06 LAB — CBC, DIFF
% Basophils: 1 %
% Eosinophils: 3 %
% Immature Granulocytes: 0 %
% Lymphocytes: 61 %
% Monocytes: 8 %
% Neutrophils: 27 %
Absolute Eosinophil Count: 0.11 10*3/uL (ref 0.00–0.50)
Absolute Lymphocyte Count: 2.34 10*3/uL (ref 1.00–4.80)
Basophils: 0.02 10*3/uL (ref 0.00–0.20)
Hematocrit: 37 % — ABNORMAL LOW (ref 38–50)
Hemoglobin: 12.8 g/dL — ABNORMAL LOW (ref 13.0–18.0)
Immature Granulocytes: 0 10*3/uL (ref 0.00–0.05)
MCH: 30.7 pg (ref 27.3–33.6)
MCHC: 34.7 g/dL (ref 32.2–36.5)
MCV: 89 fL (ref 81–98)
Monocytes: 0.3 10*3/uL (ref 0.00–0.80)
Neutrophils: 1 10*3/uL — ABNORMAL LOW (ref 1.80–7.00)
Platelet Count: 140 10*3/uL — ABNORMAL LOW (ref 150–400)
RBC: 4.17 10*6/uL — ABNORMAL LOW (ref 4.40–5.60)
RDW-CV: 14.8 % — ABNORMAL HIGH (ref 11.6–14.4)
WBC: 3.77 10*3/uL — ABNORMAL LOW (ref 4.30–10.00)

## 2015-05-06 LAB — COMPREHENSIVE METABOLIC PANEL
ALT (GPT): 33 U/L (ref 10–48)
AST (GOT): 31 U/L (ref 9–38)
Albumin: 3.7 g/dL (ref 3.5–5.2)
Alkaline Phosphatase (Total): 72 U/L (ref 39–139)
Anion Gap: 3 — ABNORMAL LOW (ref 4–12)
Bilirubin (Total): 0.4 mg/dL (ref 0.2–1.3)
Calcium: 9.4 mg/dL (ref 8.9–10.2)
Carbon Dioxide, Total: 27 meq/L (ref 22–32)
Chloride: 107 meq/L (ref 98–108)
Creatinine: 0.89 mg/dL (ref 0.51–1.18)
GFR, Calc, African American: >60 mL/min (ref >59)
GFR, Calc, European American: >60 mL/min (ref >59)
Glucose: 82 mg/dL (ref 62–125)
Potassium: 3.9 meq/L (ref 3.6–5.2)
Protein (Total): 8.1 g/dL (ref 6.0–8.2)
Sodium: 137 meq/L (ref 135–145)
Urea Nitrogen: 9 mg/dL (ref 8–21)

## 2015-05-06 LAB — PROTHROMBIN TIME
Prothrombin INR: 1.4 — ABNORMAL HIGH (ref 0.8–1.3)
Prothrombin Time Patient: 16.5 s — ABNORMAL HIGH (ref 10.7–15.6)

## 2015-05-06 MED ORDER — UMECLIDINIUM-VILANTEROL 62.5-25 MCG/ACT IN AEPB
1.0000 | INHALATION_SPRAY | Freq: Every day | RESPIRATORY_TRACT | Status: AC
Start: 2015-05-06 — End: ?

## 2015-05-06 NOTE — Patient Instructions (Signed)
You should receive a call to schedule your ultrasound

## 2015-05-06 NOTE — Progress Notes (Signed)
[facility] Visit    ID/CC: Patrick Davis is a 54 year old year old male with Stage 3 HIV( CD4 414, VL und) here for f/u PNA x 2.      HPI:  Patrick Davis is doing well.  He had sig SOB and productive cough, fever x 2.  Received 2 courses of abx, most recently levoflox x 7 days with good recovery.  He previously had good effect of umeclidinium but he didn't get refills.      We spent majority of visit discussing HCV treatment and he would like to- needs w/u     Discussed at length smoking cessation- he is smoking 1/2 ppd, not really interested in quitting.  Declines any quit assistance      Problem List:  Patient Active Problem List    Diagnosis Date Noted   . Chronic hepatitis C without hepatic coma (HCC) [B18.2] 05/06/2015   . 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 Alcohol abuse, episodic [F10.10] 04/02/2015   . Cannabis use with psychotic disorder (HCC) [F12.959] 04/02/2015   . Cocaine abuse, episodic [F14.10] 04/02/2015   . Intellectual disability [F79] 04/02/2015   . 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 IGRA neg 2016     . Reactive airway disease [J45.909] 12/10/2014     Asthma + allergies by hx, vs COPD (emphysema on CT and mild non-reversible obstruction on PFTs 2016). Hx wheezing and recurrent PNA. + smoker for many years     . Schizophrenia (HCC) [F20.9] 11/26/2014     Per patient first diagnosed when a teenager.      . Acute kidney injury (HCC) [N17.9]      TX FROM [facility]       . Human immunodeficiency virus infection (HCC) [Z21]      Hx of PJP, diagnosis in 1990s. On TRU/ATV/r for many years, simplified to Triumeq 2016       . Pneumonia [J18.9]      TX FROM [facility]             ROS  A full review of systems was performed and was negative except as documented in the HPI.    Medications:  Current Outpatient Prescriptions   Medication Sig Dispense  Refill   . Abacavir-Dolutegravir-Lamivud (TRIUMEQ) 600-50-300 MG Oral Tab Take 1 tablet by mouth daily. 30 tablet 6   . Clotrimazole 1 % External Cream Apply to affected area on leg(s) 2 times a day. For fungal rash. 45 g 2   . Mirtazapine 15 MG Oral Tab Take 1 tablet (15 mg) by mouth at bedtime. 30 tablet 5   . QUEtiapine Fumarate 400 MG Oral Tab Take 2 tablets (800 mg) by mouth at bedtime. 60 tablet 5   . Umeclidinium-Vilanterol 62.5-25 MCG/INH Inhalation AEROSOL POWDER, BREATH ACTIVATED Inhale 1 puff by mouth daily. 1 each 6     No current facility-administered medications for this visit.       Allergies:  Review of patient's allergies indicates:  No Known Allergies    Social History:  Social History     Social History   . Marital Status: Single     Spouse Name: N/A   . Number of Children: N/A   . Years of Education: N/A     Occupational History   .  Not on file.     Social History Main Topics   . Smoking status: Current Every Day Smoker   . Smokeless tobacco: Not on file   . Alcohol Use: Not on file   . Drug Use: Not on file   . Sexual Activity: Not on file     Other Topics Concern   . Not on file     Social History Narrative       Family History:  No family history on file.    Physical Exam:  Vital Signs: BP 105/69 mmHg  Pulse 104  Temp(Src) 96.4 F (35.8 C) (Temporal)  Resp 16  Ht  (1.88 m)  Wt 148 lb (67.132 kg)  BMI 18.99 kg/m2  SpO2 96%, Body mass index is 18.99 kg/(m^2).  Gen: well-appearing, nad - very thin, lost small amount of weight recently  CV: rrr, no m/r/g  Pulm: slightly decreased BS, but no tubular bs, rhonchi, or wheezes  Skin: no rash  Neuro: a&0x3; gait normal    Labs:   HIV  Results for orders placed or performed in visit on 11/26/14   T CELL SUBSET _ CD4 & CD8 ONLY   Result Value Ref Range    % CD4 29 (L) 33 - 61 %    Abs CD4 Lymphocyte Cnt 0.414 (L) 0.73 - 2.25 10*3/uL    % CD8 49 (H) 14 - 35 %    Abs CD8 Lymphocyte Cnt 0.705 0.25 - 1.24 10*3/uL    CD4/CD8 Ratio 0.59 (L) 1.00 -  3.78    Test Comment       This test was developed and its performance characteristics determined by [facility], Department of Laboratory Medicine. It has not been cleared or approved by the U.S. Food and Drug Administration.     Results for orders placed or performed in visit on 05/06/15   HIV1 RNA QUANTITATION   Result Value Ref Range    HIV RNA Specimen Plasma     HIV RNA Result  NDET [copies]/mL    HIV RNA Copies/mL (Log10)  [Log_copies]/mL    HIV RNA Interp                 Assessment and Plan:  Patrick Davis was seen today for follow-up .    Diagnoses and all orders for this visit:    Chronic hepatitis C without hepatic coma (HCC)- never tx'd.  Needs full w/u including genotype, then we'll refer to pharmacy   -     PROTHROMBIN TIME  -     Liver Fibrosis, Fibro/ActiTest  -     COMPREHENSIVE METABOLIC PANEL  -     CBC, DIFF  -     HEPATITIS C RNA GENOTYPING  -     US ABDOMEN COMPLETE    Simple chronic bronchitis (HCC)- increase therapy by adding LABA  -     Umeclidinium-Vilanterol 62.5-25 MCG/INH Inhalation AEROSOL POWDER, BREATH ACTIVATED; Inhale 1 puff by mouth daily.    Human immunodeficiency virus infection (HCC)- reports good adherence and no SE to new regimen.  Will check safety labs  -     HIV1 RNA QUANTITATION    Screen for colon cancer  -     REFERRAL TO COLONOSCOPY        HCM  Immunizations:   Immunization History   Administered Date(s) Administered   . influenza vaccine quadrivalent PF 05/01/2015   . pneumococcal (Pneumovax 23) polysaccharide vaccine 09/15/2012   . pneumococcal (Prevnar 13) conjugate  vaccine 05/01/2015

## 2015-05-08 LAB — RPR QUANTITATIVE (SENDOUT): RPR Quantitative (Sendout): NEGATIVE

## 2015-05-08 LAB — HIV1 RNA QUANTITATION
HIV RNA Copies/mL (Log10): <1.6 {Log_copies}/mL
HIV RNA Result: <40 {copies}/mL — AB

## 2015-05-12 ENCOUNTER — Telehealth (HOSPITAL_BASED_OUTPATIENT_CLINIC_OR_DEPARTMENT_OTHER): Payer: Self-pay | Admitting: Addiction (Substance Use Disorder)

## 2015-05-12 NOTE — Telephone Encounter (Signed)
TW has not seen the client in the [facility] clinic since 03/31/15 when he met with Dr. Signa Kell for his scheduled appointment. TW confirmed on EPIC that client followed through with his [facility] appointment with his PCP on 05/06/15 and picked up his Seroquel refill on 05/06/15. Client hasn't picked up his Mirtazapine since 8/16. TW called Rosezella Florida, RN at the [facility] where the client stays and left a message for her asking for an update about the client.

## 2015-05-13 LAB — HEPATITIS C RNA GENOTYPING

## 2015-05-18 ENCOUNTER — Encounter (HOSPITAL_BASED_OUTPATIENT_CLINIC_OR_DEPARTMENT_OTHER): Payer: Self-pay | Admitting: Clinical Social Worker

## 2015-05-18 NOTE — Progress Notes (Signed)
DOCUMENT TYPE: Social Work - Follow-up Note    PATIENT: Patrick Davis, Oris L [id number]  DOB: 05/17/1961    ENCOUNTER DATE:  05/18/2015    ASSOCIATED PROGRAM-CLINIC:  [facility]    CONTACT TYPE:  Face to Face - Office    BRIEF DESCRIPTION:  SW Intro, Service Plan update    PATIENT DESCRIPTION, PRESENTING PROBLEM, PATIENT / FAMILY GOALS:  54 y/o, AA male Stage III. Pt is homeless and currently staying [facility]. PCP is MD Marygrace Droughtachel Bender Ignacio and SW is [employer]ayne Pasol.    SW Pasol received a page from front desk, information Pt would like to meet.    INTERVENTION / OUTCOME / PLAN:  SW Pasol introduced self as new Sports coachcase manager at [facility] [employer] and provided business card with contact information.     Pt stated housing is primary focus. [facility] is [facility] and reported frustration because there is no housing available and continues to stay in a shelter he does not like.     Pt requested bus ticket and SW Pasol was agreeable as he does not have financial means to get back to shelter; Provided 2-$2.75 tickets. SW Pasol initiated discussion for reduced fare bus permit and Pt appeared to be receptive, particularly as SW Pasol informed him permit is a monthly pass at $36 for unlimited rides. SW Pasol and Pt agreed to return at the beginning of November 2016 to obtain reduced permit application.     SW Pasol and Pt completed a service plan update.    DOCUMENT COMPOSED BY:   Linnell FullingShayne Pasol, MSW on 05/18/2015

## 2015-05-29 ENCOUNTER — Ambulatory Visit: Payer: No Typology Code available for payment source

## 2015-06-01 DIAGNOSIS — B20 Human immunodeficiency virus [HIV] disease: Secondary | ICD-10-CM

## 2015-06-09 ENCOUNTER — Telehealth (HOSPITAL_BASED_OUTPATIENT_CLINIC_OR_DEPARTMENT_OTHER): Payer: Self-pay | Admitting: Clinical Social Worker

## 2015-06-09 ENCOUNTER — Telehealth (HOSPITAL_BASED_OUTPATIENT_CLINIC_OR_DEPARTMENT_OTHER): Payer: Self-pay | Admitting: Addiction (Substance Use Disorder)

## 2015-06-09 NOTE — Telephone Encounter (Signed)
Client hasn't been seen in [facility] since 8/16. [facility]. TW called Eugenio HoesKaren A [facility] to request an update.

## 2015-06-09 NOTE — Telephone Encounter (Signed)
DOCUMENT TYPE: Social Work - Follow-up Note    PATIENT: Patrick Davis, Auguste L [id number]  DOB: 02/26/1961    ENCOUNTER DATE:  06/09/2015    ASSOCIATED PROGRAM-CLINIC:  [place]    CONTACT TYPE:  Telephone    BRIEF DESCRIPTION:  Check-in, [facility]    PATIENT DESCRIPTION, PRESENTING PROBLEM, PATIENT / FAMILY GOALS:  54 y/o, AA male Stage III. Pt is homeless and currently [facility]. PCP is MD Marygrace Droughtachel Bender Ignacio and SW is [employer]ayne Pasol.    T/C from [facility] Case Manager Tish MenLinda Sayers, requesting collateral information regarding most recent encounter with Pt.    INTERVENTION / OUTCOME / PLAN:  SW Pasol reported last interaction occurred on 05/18/15. SW Pasol and Pt agreed to check-in at the beginning of November 2017. [facility] CM Bubba HalesSayers stated Pt has not been engaged with mental health services and will need to see provider MD Ashley Akinarolyn Brenner in order for next refills to be authorized. [facility] CM Bubba HalesSayers provided the following appointments on 12/1: 9:30a with CM Bubba HalesSayers, 10:00a with MD Darnelle BosBrenner, and 10:30a with RN Laurian BrimFrances Goldie.     T/C to Pt to check-in. Pt stated he does not need assistance with obtaining reduced bus fare permit as a friend is currently helping him with process. SW Pasol encouraged Pt to engage with [facility] and stated he needs to meet with MD Darnelle BosBrenner in order to get refills, as instructed by CM Sayers.     Plan: SW Pasol and Pt agreed to do a check-in in clinic, later in November 2016.    DOCUMENT COMPOSED BY:   Linnell FullingShayne Pasol, MSW on 06/09/2015

## 2015-07-01 DIAGNOSIS — B20 Human immunodeficiency virus [HIV] disease: Secondary | ICD-10-CM

## 2015-07-02 ENCOUNTER — Ambulatory Visit (HOSPITAL_BASED_OUTPATIENT_CLINIC_OR_DEPARTMENT_OTHER): Payer: No Typology Code available for payment source | Admitting: Addiction (Substance Use Disorder)

## 2015-07-02 ENCOUNTER — Ambulatory Visit (HOSPITAL_BASED_OUTPATIENT_CLINIC_OR_DEPARTMENT_OTHER): Payer: No Typology Code available for payment source | Admitting: Psychiatry

## 2015-07-02 ENCOUNTER — Ambulatory Visit (HOSPITAL_BASED_OUTPATIENT_CLINIC_OR_DEPARTMENT_OTHER): Payer: No Typology Code available for payment source | Attending: Psychiatry | Admitting: Psychiatry

## 2015-07-02 ENCOUNTER — Encounter (HOSPITAL_BASED_OUTPATIENT_CLINIC_OR_DEPARTMENT_OTHER): Payer: Self-pay | Admitting: Psychiatry

## 2015-07-02 DIAGNOSIS — F1410 Cocaine abuse, uncomplicated: Secondary | ICD-10-CM

## 2015-07-02 DIAGNOSIS — F332 Major depressive disorder, recurrent severe without psychotic features: Secondary | ICD-10-CM | POA: Insufficient documentation

## 2015-07-02 DIAGNOSIS — F79 Unspecified intellectual disabilities: Secondary | ICD-10-CM

## 2015-07-02 DIAGNOSIS — F631 Pyromania: Secondary | ICD-10-CM | POA: Insufficient documentation

## 2015-07-02 DIAGNOSIS — F12959 Cannabis use, unspecified with psychotic disorder, unspecified: Secondary | ICD-10-CM | POA: Insufficient documentation

## 2015-07-02 DIAGNOSIS — F209 Schizophrenia, unspecified: Secondary | ICD-10-CM

## 2015-07-02 DIAGNOSIS — F1010 Alcohol abuse, uncomplicated: Secondary | ICD-10-CM

## 2015-07-02 DIAGNOSIS — F329 Major depressive disorder, single episode, unspecified: Secondary | ICD-10-CM | POA: Insufficient documentation

## 2015-07-02 MED ORDER — SERTRALINE HCL 50 MG OR TABS
50.0000 mg | ORAL_TABLET | Freq: Every day | ORAL | Status: DC
Start: 2015-07-02 — End: 2015-10-29

## 2015-07-02 NOTE — Progress Notes (Signed)
[name] did not cancel and was not present for a scheduled appointment today.  arrived early and left without seeing this RN.
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
## 2015-07-02 NOTE — Patient Instructions (Addendum)
Thank you for coming in today. During today's visit we reviewed only your psychiatric (mental health) medications. Please follow up with your primary care provider for any questions regarding other medications.        Start diphenhydramine ( benadryl) 25- 50 mg  At bedtime as needed for  insomnia ( buy over the counter)     Start sertraline ( zoloft) 50 mg every day
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
## 2015-08-05 ENCOUNTER — Other Ambulatory Visit (HOSPITAL_BASED_OUTPATIENT_CLINIC_OR_DEPARTMENT_OTHER): Payer: Self-pay | Admitting: Infectious Disease

## 2015-08-05 ENCOUNTER — Encounter (HOSPITAL_BASED_OUTPATIENT_CLINIC_OR_DEPARTMENT_OTHER): Payer: No Typology Code available for payment source | Admitting: Infectious Disease

## 2015-08-06 ENCOUNTER — Encounter (HOSPITAL_BASED_OUTPATIENT_CLINIC_OR_DEPARTMENT_OTHER): Payer: No Typology Code available for payment source | Admitting: Psychiatry

## 2015-08-06 NOTE — Progress Notes (Signed)
Patrick Davis did not cancel and was not present for a scheduled appointment today.

## 2015-08-26 ENCOUNTER — Encounter (HOSPITAL_BASED_OUTPATIENT_CLINIC_OR_DEPARTMENT_OTHER): Payer: Self-pay | Admitting: Addiction (Substance Use Disorder)

## 2015-08-26 ENCOUNTER — Telehealth (HOSPITAL_BASED_OUTPATIENT_CLINIC_OR_DEPARTMENT_OTHER): Payer: Self-pay | Admitting: Addiction (Substance Use Disorder)

## 2015-08-26 NOTE — Progress Notes (Signed)
DOCUMENT TYPE:   Social Work - Housing Note    PATIENT:   Patrick Davis, Patrick Davis    [id number]  DOB:   Aug 18, 1960    ENCOUNTER DATE:  08/26/2015    ASSOCIATED PROGRAM-CLINIC:  [place]    CONTACT TYPE:  Attempted    BRIEF DESCRIPTION:  Attempted Outreach    PATIENT DESCRIPTION, PRESENTING PROBLEM, PATIENT / FAMILY GOALS:  AA male, moved here from [place], Psychiatric hx, not currently taking medication    Housing Navigator attempted to call with updates    INTERVENTION / OUTCOME / PLAN:  INTERVENTION    Housing Navigator called client to give update. There was no answer. HN left a VM explaining that [employer] will be going live soon, and that it is important to get in touch around that, as well as [facility] upcoming lottery.    PLAN    HN will attempt outreach in another 2-4 weeks.    VISIT RELATED TO DOMESTIC VIOLENCE?  No    IS PATIENT HOMELESS?  Yes    CURRENTLY INCARCERATED / WORK RELEASE?  No    ISSUE ADDRESSED: (HOUSING)  Other    SOCIAL WORK SERVICES PROVIDED:  In English    DOCUMENT COMPOSED BY:   Danamarie Minami   on  08/26/2015

## 2015-08-26 NOTE — Progress Notes (Signed)
Called Carmell Austria, RN at [facility]. [employer]. [phone]. Asked her to remind the client about his appointment with Dr. Darnelle Bos on 08/27/15 at 10:00 AM.  Meds. Available 09/07/15.

## 2015-08-26 NOTE — Telephone Encounter (Signed)
Left message reminding client about his appointment on 08/27/15 at 10:00 AM with Dr. Darnelle Bos.

## 2015-08-27 ENCOUNTER — Encounter (HOSPITAL_BASED_OUTPATIENT_CLINIC_OR_DEPARTMENT_OTHER): Payer: Self-pay | Admitting: Psychiatry

## 2015-08-27 ENCOUNTER — Ambulatory Visit (HOSPITAL_BASED_OUTPATIENT_CLINIC_OR_DEPARTMENT_OTHER): Payer: No Typology Code available for payment source | Attending: Psychiatry | Admitting: Psychiatry

## 2015-08-27 DIAGNOSIS — F1410 Cocaine abuse, uncomplicated: Secondary | ICD-10-CM

## 2015-08-27 DIAGNOSIS — F631 Pyromania: Secondary | ICD-10-CM | POA: Insufficient documentation

## 2015-08-27 DIAGNOSIS — F1010 Alcohol abuse, uncomplicated: Secondary | ICD-10-CM | POA: Insufficient documentation

## 2015-08-27 DIAGNOSIS — F12959 Cannabis use, unspecified with psychotic disorder, unspecified: Secondary | ICD-10-CM

## 2015-08-27 DIAGNOSIS — F79 Unspecified intellectual disabilities: Secondary | ICD-10-CM | POA: Insufficient documentation

## 2015-08-27 DIAGNOSIS — F209 Schizophrenia, unspecified: Secondary | ICD-10-CM | POA: Insufficient documentation

## 2015-08-27 DIAGNOSIS — F332 Major depressive disorder, recurrent severe without psychotic features: Secondary | ICD-10-CM | POA: Insufficient documentation

## 2015-08-27 MED ORDER — NALTREXONE HCL 50 MG OR TABS
50.0000 mg | ORAL_TABLET | Freq: Every day | ORAL | Status: DC
Start: 2015-08-27 — End: 2015-10-29

## 2015-08-27 NOTE — Patient Instructions (Addendum)
Thank you for coming in today. During today's visit we reviewed only your psychiatric (mental health) medications. Please follow up with your primary care provider for any questions regarding other medications.        Sertraline 50 mg daily    Try over the counter diphenhydramine ( benadryl) 25-50 mg at bedtime as needed for insomnia       appt with Cathyrn RN next week     Follow up with [facility]
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
## 2015-09-01 ENCOUNTER — Ambulatory Visit (HOSPITAL_BASED_OUTPATIENT_CLINIC_OR_DEPARTMENT_OTHER): Payer: No Typology Code available for payment source | Admitting: Registered Nurse

## 2015-09-10 ENCOUNTER — Emergency Department (HOSPITAL_BASED_OUTPATIENT_CLINIC_OR_DEPARTMENT_OTHER)
Admission: EM | Admit: 2015-09-10 | Discharge: 2015-09-10 | Disposition: A | Discharge status: Home / Self Care | Payer: No Typology Code available for payment source | Attending: Internal Medicine | Admitting: Internal Medicine

## 2015-09-10 ENCOUNTER — Other Ambulatory Visit: Payer: Self-pay | Admitting: Nurse Practitioner

## 2015-09-10 DIAGNOSIS — R200 Anesthesia of skin: Secondary | ICD-10-CM | POA: Insufficient documentation

## 2015-09-10 DIAGNOSIS — R0602 Shortness of breath: Secondary | ICD-10-CM

## 2015-09-10 DIAGNOSIS — R000 Tachycardia, unspecified: Secondary | ICD-10-CM | POA: Insufficient documentation

## 2015-09-10 DIAGNOSIS — R0682 Tachypnea, not elsewhere classified: Secondary | ICD-10-CM | POA: Insufficient documentation

## 2015-09-10 DIAGNOSIS — F1910 Other psychoactive substance abuse, uncomplicated: Secondary | ICD-10-CM | POA: Insufficient documentation

## 2015-09-10 DIAGNOSIS — Z720 Tobacco use: Secondary | ICD-10-CM | POA: Insufficient documentation

## 2015-09-10 DIAGNOSIS — R0789 Other chest pain: Secondary | ICD-10-CM

## 2015-09-10 DIAGNOSIS — F209 Schizophrenia, unspecified: Secondary | ICD-10-CM | POA: Insufficient documentation

## 2015-09-10 DIAGNOSIS — R209 Unspecified disturbances of skin sensation: Secondary | ICD-10-CM

## 2015-09-10 DIAGNOSIS — Z590 Homelessness: Secondary | ICD-10-CM | POA: Insufficient documentation

## 2015-09-10 LAB — COMPREHENSIVE METABOLIC PANEL
ALT (GPT): 90 U/L — ABNORMAL HIGH (ref 10–48)
ALT (GPT): 89 U/L — ABNORMAL HIGH (ref 10–48)
AST (GOT): 142 U/L — ABNORMAL HIGH (ref 9–38)
AST (GOT): 165 U/L — ABNORMAL HIGH (ref 9–38)
Albumin: 4.1 g/dL (ref 3.5–5.2)
Albumin: 3.7 g/dL (ref 3.5–5.2)
Alkaline Phosphatase (Total): 82 U/L (ref 39–139)
Alkaline Phosphatase (Total): 76 U/L (ref 39–139)
Anion Gap: 14 — ABNORMAL HIGH (ref 4–12)
Anion Gap: 12 (ref 4–12)
Bilirubin (Total): 1.5 mg/dL — ABNORMAL HIGH (ref 0.2–1.3)
Bilirubin (Total): 1.6 mg/dL — ABNORMAL HIGH (ref 0.2–1.3)
Calcium: 9.5 mg/dL (ref 8.9–10.2)
Calcium: 8.8 mg/dL — ABNORMAL LOW (ref 8.9–10.2)
Carbon Dioxide, Total: 23 meq/L (ref 22–32)
Carbon Dioxide, Total: 23 meq/L (ref 22–32)
Chloride: 99 meq/L (ref 98–108)
Chloride: 99 meq/L (ref 98–108)
Creatinine: 0.76 mg/dL (ref 0.51–1.18)
Creatinine: 0.67 mg/dL (ref 0.51–1.18)
GFR, Calc, African American: >60 mL/min/{1.73_m2}
GFR, Calc, African American: >60 mL/min/{1.73_m2}
GFR, Calc, European American: >60 mL/min/{1.73_m2}
GFR, Calc, European American: >60 mL/min/{1.73_m2}
Glucose: 66 mg/dL (ref 62–125)
Glucose: 58 mg/dL — ABNORMAL LOW (ref 62–125)
Potassium: 3.8 meq/L (ref 3.6–5.2)
Potassium: 3.6 meq/L (ref 3.6–5.2)
Protein (Total): 8.5 g/dL — ABNORMAL HIGH (ref 6.0–8.2)
Protein (Total): 7.9 g/dL (ref 6.0–8.2)
Sodium: 136 meq/L (ref 135–145)
Sodium: 134 meq/L — ABNORMAL LOW (ref 135–145)
Urea Nitrogen: 17 mg/dL (ref 8–21)
Urea Nitrogen: 15 mg/dL (ref 8–21)

## 2015-09-10 LAB — LAB ADD ON ORDER

## 2015-09-10 LAB — CBC, DIFF
% Basophils: 0 %
% Eosinophils: 0 %
% Immature Granulocytes: 0 %
% Lymphocytes: 30 %
% Monocytes: 10 %
% Neutrophils: 60 %
Absolute Eosinophil Count: 0.02 10*3/uL (ref 0.00–0.50)
Absolute Lymphocyte Count: 2.34 10*3/uL (ref 1.00–4.80)
Basophils: 0.01 10*3/uL (ref 0.00–0.20)
Hematocrit: 41 % (ref 38–50)
Hemoglobin: 14.3 g/dL (ref 13.0–18.0)
Immature Granulocytes: 0.01 10*3/uL (ref 0.00–0.05)
MCH: 31 pg (ref 27.3–33.6)
MCHC: 34.7 g/dL (ref 32.2–36.5)
MCV: 89 fL (ref 81–98)
Monocytes: 0.79 10*3/uL (ref 0.00–0.80)
Neutrophils: 4.63 10*3/uL (ref 1.80–7.00)
Platelet Count: 220 10*3/uL (ref 150–400)
RBC: 4.62 10*6/uL (ref 4.40–5.60)
RDW-CV: 12.8 % (ref 11.6–14.4)
WBC: 7.8 10*3/uL (ref 4.30–10.00)

## 2015-09-10 LAB — TROPONIN_I
Troponin_I Interpretation: NORMAL
Troponin_I Interpretation: NORMAL
Troponin_I: 0.03 ng/mL (ref <0.04)
Troponin_I: 0.03 ng/mL (ref <0.04)

## 2015-09-10 LAB — D-DIMER,QUANT: D_Dimer, Quant: 0.71 ug{FEU}/mL — ABNORMAL HIGH (ref 0.00–0.59)

## 2015-09-10 LAB — PROTHROMBIN & PTT
Partial Thromboplastin Time: 29 s (ref 22–35)
Prothrombin INR: 1.3 (ref 0.8–1.3)
Prothrombin Time Patient: 15.3 s (ref 10.7–15.6)

## 2015-09-10 LAB — ALCOHOL (ETHYL): Alcohol (Ethyl): NEGATIVE mg/dL

## 2015-09-10 LAB — STANDARD DRUG SCREEN, URN
Acetaminophen Qualitative, URN: NEGATIVE
Alcohol (Ethyl), URN: NEGATIVE mg/dL
Amphet/Methamphetamine Qual,URN: POSITIVE — AB
Barbiturate (Qual), URN: NEGATIVE
Benzodiazepines (Qual), URN: NEGATIVE
Cannabinoids (Qual), URN: POSITIVE — AB
Cocaine (Qual), URN: POSITIVE — AB
Methadone (Qual), URN: NEGATIVE
Opiates (Qual), URN: NEGATIVE
Phencyclidine (Qual), URN: NEGATIVE
Tricyclic Antidepressants, URN: NEGATIVE

## 2015-09-15 ENCOUNTER — Other Ambulatory Visit: Payer: Self-pay | Admitting: Internal Medicine

## 2015-09-15 ENCOUNTER — Emergency Department (HOSPITAL_BASED_OUTPATIENT_CLINIC_OR_DEPARTMENT_OTHER)
Admission: EM | Admit: 2015-09-15 | Discharge: 2015-09-15 | Disposition: A | Discharge status: Home Health | Payer: No Typology Code available for payment source | Attending: Emergency Medicine | Admitting: Emergency Medicine

## 2015-09-15 DIAGNOSIS — R05 Cough: Secondary | ICD-10-CM | POA: Insufficient documentation

## 2015-09-15 DIAGNOSIS — Z720 Tobacco use: Secondary | ICD-10-CM | POA: Insufficient documentation

## 2015-09-15 DIAGNOSIS — B20 Human immunodeficiency virus [HIV] disease: Secondary | ICD-10-CM

## 2015-09-15 DIAGNOSIS — Z590 Homelessness: Secondary | ICD-10-CM

## 2015-09-15 DIAGNOSIS — R200 Anesthesia of skin: Secondary | ICD-10-CM | POA: Insufficient documentation

## 2015-09-15 DIAGNOSIS — R079 Chest pain, unspecified: Secondary | ICD-10-CM | POA: Insufficient documentation

## 2015-09-15 LAB — COMPREHENSIVE METABOLIC PANEL
ALT (GPT): 64 U/L — ABNORMAL HIGH (ref 10–48)
AST (GOT): 52 U/L — ABNORMAL HIGH (ref 9–38)
Albumin: 3.5 g/dL (ref 3.5–5.2)
Alkaline Phosphatase (Total): 55 U/L (ref 39–139)
Anion Gap: 3 — ABNORMAL LOW (ref 4–12)
Bilirubin (Total): 0.4 mg/dL (ref 0.2–1.3)
Calcium: 9.1 mg/dL (ref 8.9–10.2)
Carbon Dioxide, Total: 30 meq/L (ref 22–32)
Chloride: 103 meq/L (ref 98–108)
Creatinine: 0.72 mg/dL (ref 0.51–1.18)
GFR, Calc, African American: >60 mL/min/{1.73_m2}
GFR, Calc, European American: >60 mL/min/{1.73_m2}
Glucose: 100 mg/dL (ref 62–125)
Potassium: 4 meq/L (ref 3.6–5.2)
Protein (Total): 7.7 g/dL (ref 6.0–8.2)
Sodium: 136 meq/L (ref 135–145)
Urea Nitrogen: 10 mg/dL (ref 8–21)

## 2015-09-15 LAB — CBC, DIFF
% Basophils: 0 %
% Eosinophils: 1 %
% Immature Granulocytes: 0 %
% Lymphocytes: 25 %
% Monocytes: 9 %
% Neutrophils: 65 %
Absolute Eosinophil Count: 0.08 10*3/uL (ref 0.00–0.50)
Absolute Lymphocyte Count: 1.61 10*3/uL (ref 1.00–4.80)
Basophils: 0.02 10*3/uL (ref 0.00–0.20)
Hematocrit: 38 % (ref 38–50)
Hemoglobin: 12.7 g/dL — ABNORMAL LOW (ref 13.0–18.0)
Immature Granulocytes: 0.01 10*3/uL (ref 0.00–0.05)
MCH: 30.5 pg (ref 27.3–33.6)
MCHC: 33.4 g/dL (ref 32.2–36.5)
MCV: 91 fL (ref 81–98)
Monocytes: 0.61 10*3/uL (ref 0.00–0.80)
Neutrophils: 4.22 10*3/uL (ref 1.80–7.00)
Platelet Count: 246 10*3/uL (ref 150–400)
RBC: 4.17 10*6/uL — ABNORMAL LOW (ref 4.40–5.60)
RDW-CV: 13.1 % (ref 11.6–14.4)
WBC: 6.55 10*3/uL (ref 4.30–10.00)

## 2015-09-15 LAB — TROPONIN_I
Troponin_I Interpretation: NORMAL
Troponin_I: <0.03 ng/mL (ref <0.04)

## 2015-09-24 ENCOUNTER — Encounter (HOSPITAL_BASED_OUTPATIENT_CLINIC_OR_DEPARTMENT_OTHER): Payer: No Typology Code available for payment source | Admitting: Psychiatry

## 2015-09-25 ENCOUNTER — Encounter (HOSPITAL_BASED_OUTPATIENT_CLINIC_OR_DEPARTMENT_OTHER): Payer: Self-pay

## 2015-09-28 ENCOUNTER — Other Ambulatory Visit: Payer: Self-pay | Admitting: Nurse Practitioner

## 2015-09-28 ENCOUNTER — Emergency Department (HOSPITAL_BASED_OUTPATIENT_CLINIC_OR_DEPARTMENT_OTHER)
Admission: EM | Admit: 2015-09-28 | Discharge: 2015-09-28 | Payer: No Typology Code available for payment source | Attending: Nurse Practitioner | Admitting: Nurse Practitioner

## 2015-09-28 DIAGNOSIS — F209 Schizophrenia, unspecified: Secondary | ICD-10-CM | POA: Insufficient documentation

## 2015-09-28 DIAGNOSIS — M25512 Pain in left shoulder: Secondary | ICD-10-CM

## 2015-09-28 DIAGNOSIS — R200 Anesthesia of skin: Secondary | ICD-10-CM

## 2015-09-28 DIAGNOSIS — M5412 Radiculopathy, cervical region: Secondary | ICD-10-CM

## 2015-09-28 DIAGNOSIS — B20 Human immunodeficiency virus [HIV] disease: Secondary | ICD-10-CM | POA: Insufficient documentation

## 2015-09-28 DIAGNOSIS — M542 Cervicalgia: Secondary | ICD-10-CM

## 2015-09-28 LAB — COMPREHENSIVE METABOLIC PANEL
ALT (GPT): 32 U/L (ref 10–48)
AST (GOT): 30 U/L (ref 9–38)
Albumin: 3.6 g/dL (ref 3.5–5.2)
Alkaline Phosphatase (Total): 60 U/L (ref 39–139)
Anion Gap: 5 (ref 4–12)
Bilirubin (Total): 0.5 mg/dL (ref 0.2–1.3)
Calcium: 9.3 mg/dL (ref 8.9–10.2)
Carbon Dioxide, Total: 28 meq/L (ref 22–32)
Chloride: 102 meq/L (ref 98–108)
Creatinine: 0.69 mg/dL (ref 0.51–1.18)
GFR, Calc, African American: >60 mL/min/{1.73_m2}
GFR, Calc, European American: >60 mL/min/{1.73_m2}
Glucose: 90 mg/dL (ref 62–125)
Potassium: 4.1 meq/L (ref 3.6–5.2)
Protein (Total): 7.7 g/dL (ref 6.0–8.2)
Sodium: 135 meq/L (ref 135–145)
Urea Nitrogen: 9 mg/dL (ref 8–21)

## 2015-09-28 LAB — LAB ADD ON ORDER

## 2015-09-28 LAB — CBC, DIFF
% Basophils: 0 %
% Eosinophils: 1 %
% Immature Granulocytes: 0 %
% Lymphocytes: 33 %
% Monocytes: 8 %
% Neutrophils: 58 %
Absolute Eosinophil Count: 0.08 10*3/uL (ref 0.00–0.50)
Absolute Lymphocyte Count: 2.21 10*3/uL (ref 1.00–4.80)
Basophils: 0.02 10*3/uL (ref 0.00–0.20)
Hematocrit: 38 % (ref 38–50)
Hemoglobin: 12.9 g/dL — ABNORMAL LOW (ref 13.0–18.0)
Immature Granulocytes: 0.02 10*3/uL (ref 0.00–0.05)
MCH: 30.9 pg (ref 27.3–33.6)
MCHC: 34.2 g/dL (ref 32.2–36.5)
MCV: 90 fL (ref 81–98)
Monocytes: 0.53 10*3/uL (ref 0.00–0.80)
Neutrophils: 3.89 10*3/uL (ref 1.80–7.00)
Platelet Count: 209 10*3/uL (ref 150–400)
RBC: 4.18 10*6/uL — ABNORMAL LOW (ref 4.40–5.60)
RDW-CV: 13.4 % (ref 11.6–14.4)
WBC: 6.75 10*3/uL (ref 4.30–10.00)

## 2015-09-28 LAB — CRP WITH CARDIAC RISK: C_Reactive Protein: 27.9 mg/L — ABNORMAL HIGH (ref 0.0–10.0)

## 2015-09-28 LAB — ACETAMINOPHEN (TYLENOL): Acetaminophen (Tylenol): <10 ug/mL (ref 0–25)

## 2015-09-28 LAB — SED RATE: Erythrocyte Sedimentation Rate: 35 mm/h — ABNORMAL HIGH (ref 0–15)
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
## 2015-10-06 ENCOUNTER — Ambulatory Visit: Payer: No Typology Code available for payment source

## 2015-10-07 ENCOUNTER — Encounter (HOSPITAL_BASED_OUTPATIENT_CLINIC_OR_DEPARTMENT_OTHER): Payer: Self-pay | Admitting: Clinical Social Worker

## 2015-10-07 ENCOUNTER — Encounter (HOSPITAL_BASED_OUTPATIENT_CLINIC_OR_DEPARTMENT_OTHER): Payer: Self-pay | Admitting: Infectious Disease

## 2015-10-07 ENCOUNTER — Ambulatory Visit (HOSPITAL_BASED_OUTPATIENT_CLINIC_OR_DEPARTMENT_OTHER): Payer: No Typology Code available for payment source | Attending: Infectious Disease | Admitting: Infectious Disease

## 2015-10-07 VITALS — BP 164/117 | HR 100 | Temp 98.6°F | Resp 16 | Ht 74.0 in | Wt 148.0 lb

## 2015-10-07 DIAGNOSIS — L02414 Cutaneous abscess of left upper limb: Secondary | ICD-10-CM

## 2015-10-07 DIAGNOSIS — Z21 Asymptomatic human immunodeficiency virus [HIV] infection status: Secondary | ICD-10-CM | POA: Insufficient documentation

## 2015-10-07 DIAGNOSIS — M5000 Cervical disc disorder with myelopathy, unspecified cervical region: Secondary | ICD-10-CM | POA: Insufficient documentation

## 2015-10-07 DIAGNOSIS — L02413 Cutaneous abscess of right upper limb: Secondary | ICD-10-CM

## 2015-10-07 DIAGNOSIS — L02419 Cutaneous abscess of limb, unspecified: Secondary | ICD-10-CM | POA: Insufficient documentation

## 2015-10-07 DIAGNOSIS — B20 Human immunodeficiency virus [HIV] disease: Secondary | ICD-10-CM

## 2015-10-07 MED ORDER — SULFAMETHOXAZOLE-TRIMETHOPRIM 800-160 MG OR TABS
1.0000 | ORAL_TABLET | Freq: Two times a day (BID) | ORAL | Status: AC
Start: 2015-10-07 — End: 2015-10-14

## 2015-10-07 MED ORDER — OXYCODONE HCL 5 MG OR TABS
5.0000 mg | ORAL_TABLET | Freq: Four times a day (QID) | ORAL | Status: AC | PRN
Start: 2015-10-07 — End: ?

## 2015-10-07 MED ORDER — NAPROXEN 500 MG OR TABS
500.0000 mg | ORAL_TABLET | Freq: Two times a day (BID) | ORAL | Status: AC | PRN
Start: 2015-10-07 — End: ?

## 2015-10-07 MED ORDER — GABAPENTIN 600 MG OR TABS
ORAL_TABLET | ORAL | Status: AC
Start: 2015-10-07 — End: ?

## 2015-10-07 NOTE — Progress Notes (Signed)
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 Pt is homeless and [facility]. PCP is MD Marygrace Droughtachel Bender Ignacio and SW is [employer]ayne Pasol.    SW Pasol was notified Clinical Assessment Staff Merla RichesMary Grace that Pt is at clinic and received notification for adherence issues.    INTERVENTION / OUTCOME / PLAN:  SW Pasol checked-in with Pt and completed HAART Counseling. Pt reported poor adherence due to recent meth use. SW Pasol and Pt discussed importance of consistent medication use and encouraged sobriety to remember taking Rx.     SW Pasol and Pt completed a Service Plan update.     Consultation with PCP MD Dulcy FannyBender Ignacio prior to appointment. SW Pasol informed poor adherence due to recent meth use; PCP MD Dulcy FannyBender Ignacio noted Pt is usually adherent to Rx. SW Pasol also noted Pt currently has puss releasing from injection site; PCP MD Dulcy FannyBender Ignacio will follow up in appointment.     Plan: SW Pasol will continue to coordinate with medical team. Pt agreed to contact SW Pasol for case management services.    DOCUMENT COMPOSED BY:   Linnell FullingShayne Pasol, MSW on 10/07/2015

## 2015-10-09 ENCOUNTER — Ambulatory Visit (HOSPITAL_BASED_OUTPATIENT_CLINIC_OR_DEPARTMENT_OTHER): Payer: No Typology Code available for payment source | Attending: Infectious Disease

## 2015-10-09 DIAGNOSIS — Z4800 Encounter for change or removal of nonsurgical wound dressing: Secondary | ICD-10-CM | POA: Insufficient documentation

## 2015-10-09 NOTE — Progress Notes (Signed)
[facility] Visit    ID/CC: Patrick Davis is a 55 year old year old male with Stage 3 HIV( CD4 414, VL und) here for B antecubital abscesses.      HPI:  Patrick Davis has been having arm and upper back pain that he presumes is due to his back.  Has some numbness in the first 3 fingers of L hand.  Seen in ED and had an MRI that shows multilevel cervical DJD with some foraminal narrowing that corresponds to his symptoms, but no red flags.    Because of his pain, someone offered to shoot him up with meth- he's never used IVD.  The person told him that the needle was sterile. He shot up 1x each anetcub and starting 2 days afterward had swelling and redness with purulence of area.  He's not having any systemic symptoms, but sig pain in the antecubs.  He hated the feeling of meth- couldn't sleep and worsened his psych symptoms so he doesn't want to use any more.    Had PNA a few months ago, not having any mor pulmonar sx.    Got a bit of gabapentin in ED, helps with pain some, but not very much.      Problem List:  Patient Active Problem List    Diagnosis Date Noted   . Intervertebral cervical disc disorder with myelopathy, cervical region [M50.00] 10/07/2015   . Abscess of antecubital fossa [L02.419] 10/07/2015   . MDD (major depressive disorder) (HCC) [F32.9] 07/02/2015   . Chronic hepatitis C without hepatic coma (HCC) [B18.2] 05/06/2015   . 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 Alcohol abuse, episodic [F10.10] 04/02/2015   . Cannabis use with psychotic disorder (HCC) [F12.959] 04/02/2015   . Cocaine abuse, episodic [F14.10] 04/02/2015   . Intellectual disability [F79] 04/02/2015   . 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 IGRA neg 2016     . Reactive airway disease [J45.909] 12/10/2014     Asthma + allergies by hx, vs COPD (emphysema on CT and mild  non-reversible obstruction on PFTs 2016). Hx wheezing and recurrent PNA. + smoker for many years     . Schizophrenia (HCC) [F20.9] 11/26/2014     Per patient first diagnosed when a teenager.      . Acute kidney injury (HCC) [N17.9]      TX FROM [facility]       . Human immunodeficiency virus infection (HCC) [Z21]      Hx of PJP, diagnosis in 1990s. On TRU/ATV/r for many years, simplified to Triumeq 2016       . Pneumonia [J18.9]      TX FROM [facility]             ROS  A full review of systems was performed and was negative except as documented in the HPI.    Medications:  Current Outpatient Prescriptions   Medication Sig Dispense Refill   . Abacavir-Dolutegravir-Lamivud (TRIUMEQ) 600-50-300 MG Oral Tab Take 1 tablet by mouth daily. 30 tablet 6   . Clotrimazole 1 % External Cream Apply to affected area on leg(s) 2 times a day. For fungal rash. 45 g 2   . Gabapentin 600 MG Oral Tab Do not crush. Take 1 pill morning and noon, take 2 before bed.  No more  than 4 tablets daily 120 tablet 2   . Naltrexone HCl 50 MG Oral Tab Take 1 tablet (50 mg) by mouth daily. 30 tablet 5   . Naproxen 500 MG Oral Tab Take 1 tablet (500 mg) by mouth 2 times a day as needed. For pain/inflammation. Take with food. 60 tablet 2   . OxyCODONE HCl 5 MG Oral Tab Take 1-2 tablets (5-10 mg) by mouth every 6 hours as needed for pain. For Pain. 10 tablet 0   . QUEtiapine Fumarate 400 MG Oral Tab Take 2 tablets (800 mg) by mouth at bedtime. 60 tablet 5   . Sertraline HCl 50 MG Oral Tab Take 1 tablet (50 mg) by mouth daily. 30 tablet 6   . Sulfamethoxazole-Trimethoprim 800-160 MG Oral Tab Take 1 tablet by mouth 2 times a day for 7 days. 14 tablet 0   . Umeclidinium-Vilanterol 62.5-25 MCG/INH Inhalation AEROSOL POWDER, BREATH ACTIVATED Inhale 1 puff by mouth daily. 1 each 6     No current facility-administered medications for this visit.       Allergies:  Review of patient's allergies indicates:  No Known Allergies    Social History:  Social History     Social  History   . Marital Status: Single     Spouse Name: N/A   . Number of Children: N/A   . Years of Education: N/A     Occupational History   . Not on file.     Social History Main Topics   . Smoking status: Current Every Day Smoker   . Smokeless tobacco: Not on file   . Alcohol Use: Not on file   . Drug Use: Not on file   . Sexual Activity: Not on file     Other Topics Concern   . Not on file     Social History Narrative       Family History:  No family history on file.    Physical Exam:  Vital Signs: BP 164/117 mmHg  Pulse 100  Temp(Src) 98.6 F (37 C) (Temporal)  Resp 16  Ht  (1.88 m)  Wt 148 lb (67.132 kg)  BMI 18.99 kg/m2  SpO2 97%, Body mass index is 18.99 kg/(m^2).  Gen: well-appearing, nad  HEENT: op clear, mmm, sclera anicteric  Skin: no rash  MSK:  Has localized abscesses in B antecubes, no obvious vascular involvement, no swelling or vascular clot or other involvement.  L antecub:  Abscess is 2cm, minimal erythema.  R antecub total 4-5cm with 2 separate loculations almost no surrounding erythema.      PROCEDURE:  Consented patient to I&D.  Assisted by MS1 Max Roengenbaum  I cleaned the skin surface and surrounding area with alcohol x 2  Each side was injected with about 3cc of 2% epi with lidocaine and allowed to dwell for 3+ mins, injection caused some spontaneous drainage  An #11 blade was used to incise just <1cm given proximity to cephalic vein on each side.  Then blunt dissection was used to break up loculations deep and lateral to incisions. On the R the 2 abscesses were opened into a single cavity.  Both were then packed with 1/4" wicking and covered with gauze and secured with tape.  There was no sig EBL.    Labs:   HIV  Results for orders placed or performed in visit on 11/26/14   T CELL SUBSET _ CD4 & CD8 ONLY   Result Value Ref Range    %  CD4 29 (L) 33 - 61 %    Abs CD4 Lymphocyte Cnt 0.414 (L) 0.73 - 2.25 10*3/uL    % CD8 49 (H) 14 - 35 %    Abs CD8 Lymphocyte Cnt 0.705 0.25 - 1.24  10*3/uL    CD4/CD8 Ratio 0.59 (L) 1.00 - 3.78    Test Comment       This test was developed and its performance characteristics determined by [facility], Department of Laboratory Medicine. It has not been cleared or approved by the U.S. Food and Drug Administration.     Results for orders placed or performed in visit on 05/06/15   HIV1 RNA QUANTITATION   Result Value Ref Range    HIV RNA Specimen Plasma     HIV RNA Result <40 (A) NDET [copies]/mL    HIV RNA Copies/mL (Log10) <1.60 [Log_copies]/mL    HIV RNA Interp       HIV-1 RNA was detected in this sample but below the assay's limit of quantitation.  The analytic range for the Abbott RealTime HIV-1 RNA assay is 40 to 10,000,000 copies/mL.               Assessment and Plan:  Patrick Davis was seen today for follow-up .    Diagnoses and all orders for this visit:    Intervertebral cervical disc disorder with myelopathy, cervical region  -     Gabapentin 600 MG Oral Tab; Do not crush. Take 1 pill morning and noon, take 2 before bed.  No more than 4 tablets daily  -     Naproxen 500 MG Oral Tab; Take 1 tablet (500 mg) by mouth 2 times a day as needed. For pain/inflammation. Take with food.    Abscess of antecubital fossa  -     Naproxen 500 MG Oral Tab; Take 1 tablet (500 mg) by mouth 2 times a day as needed. For pain/inflammation. Take with food.  -     Sulfamethoxazole-Trimethoprim 800-160 MG Oral Tab; Take 1 tablet by mouth 2 times a day for 7 days.  -     OxyCODONE HCl 5 MG Oral Tab; Take 1-2 tablets (5-10 mg) by mouth every 6 hours as needed for pain. For Pain.  I emphasized to him oxy was only for procedural pain- will not be treating the neck DJD with oxy.    Human immunodeficiency virus infection (HCC)  - Continue Triumeq, labs next visit      F/u soon for pain titration    2 days for wick change with RN    HCM  Immunizations:   Immunization History   Administered Date(s) Administered   . influenza vaccine quadrivalent PF 05/01/2015   . pneumococcal (Pneumovax 23)  polysaccharide vaccine 09/15/2012   . pneumococcal (Prevnar 13) conjugate vaccine 05/01/2015

## 2015-10-09 NOTE — Progress Notes (Signed)
[name] Cordelia PenLeon Davis is a 55 year old year old male  here for B antecubital wounds r/t I&D of abscesses.    .............................................Marland Kitchen.Wound Care    Wound location: Bilateral anticubital  Type: Open areas from I&D of abcessess  Wound size (length x width x depth in cm): R antecubital has 2 open areas.  Unable to measure depth r/t pt anxiety, kept saying "no, no, no" and pulling away  L antecubital :  One pinpoint open area  Wound base/description: R:  Open areas with purulent drainage surrounded by pink tissue.   L:  Small opening surrounded by pink tissue  No s/s of infection  Wound drainage amount/type/odor: Serosang drainage from both areas.  R side: moderate amount  L side: scant amount.  No odor from either side.     Care/treatment: B sites washed with hot soapy water and patted dry.  R side open areas packed with 1/4" nugauze, wrapped in kerlix and secured with coban.  L side wrapped in kerlix and secured with coban.    Evaluation of care: Pt tolerated the procedure well and left ambulating.  He is to return twice weekly for wound care until healed.  He will RTC Tuesday 3/14 for next wound care.

## 2015-10-11 NOTE — Progress Notes (Signed)
[facility]Out[facility] Progress Note    10/06/2015    Reason for Visit:  Chief Complaint   Patient presents with   . Follow-Up    . Wound Check/Dressing Change       Subjective: Patient presents to the nurse c/o pain in his right and left arms.  He reports someone offered to    shoot him up with meth- he's never used IVD. The person told him that the needle was sterile. He shot up 1x   each anetcub and starting 2 days afterward had swelling, redness and pain in the antecubs. He hated the feeling   of meth- couldn't sleep and worsened his psych symptoms so he doesn't want to use any more. Patient [facility]   [facility]    [facility]    [facility]    Living Arrangements: homeless    Pt's PCP:  Dulcy FannyBender Ignacio, Windy Fastachel A, MD    Vitals:  Temp(Src) 6999 F (37.2 C) (Temporal)    ROS: identified by name and birth date   Constitutional: clean   Eyes: eye patch   Ears, Nose, Mouth, Throat: no c/o   Cardiovascular: denies chest pain   Respiratory: denies shortness of breath    Gastrointestinal: no nausea   Genitourinary: no diarrhea   Musculoskeletal: steady gait    Skin:   L antecub: Abscess is 2cm, minimal erythema. R antecub total 4-5cm some surrounding erythema   without any drainage   Neurological alert and oriented    Psychiatric: appropriate, anxious      Referrals: no    Medications provided per protocol: na    Plan:  Keep appt tomorrow with PCP    Patient voiced understanding and agreement.       Electronically signed:  Carmell AustriaKaren Adler RN

## 2015-10-13 ENCOUNTER — Ambulatory Visit (HOSPITAL_BASED_OUTPATIENT_CLINIC_OR_DEPARTMENT_OTHER): Payer: No Typology Code available for payment source

## 2015-10-29 ENCOUNTER — Other Ambulatory Visit (HOSPITAL_BASED_OUTPATIENT_CLINIC_OR_DEPARTMENT_OTHER): Payer: Self-pay | Admitting: Psychiatry

## 2015-10-29 DIAGNOSIS — F332 Major depressive disorder, recurrent severe without psychotic features: Secondary | ICD-10-CM

## 2015-10-29 DIAGNOSIS — F1010 Alcohol abuse, uncomplicated: Secondary | ICD-10-CM

## 2015-10-29 DIAGNOSIS — F209 Schizophrenia, unspecified: Secondary | ICD-10-CM

## 2015-10-29 MED ORDER — NALTREXONE HCL 50 MG OR TABS
50.0000 mg | ORAL_TABLET | Freq: Every day | ORAL | Status: AC
Start: 2015-10-29 — End: ?

## 2015-10-29 MED ORDER — QUETIAPINE FUMARATE 400 MG OR TABS
800.0000 mg | ORAL_TABLET | Freq: Every evening | ORAL | Status: AC
Start: 2015-10-29 — End: ?

## 2015-10-29 MED ORDER — SERTRALINE HCL 50 MG OR TABS
50.0000 mg | ORAL_TABLET | Freq: Every day | ORAL | Status: AC
Start: 2015-10-29 — End: ?

## 2015-10-30 DIAGNOSIS — B20 Human immunodeficiency virus [HIV] disease: Secondary | ICD-10-CM

## 2015-11-11 ENCOUNTER — Encounter (HOSPITAL_BASED_OUTPATIENT_CLINIC_OR_DEPARTMENT_OTHER): Payer: No Typology Code available for payment source | Admitting: Infectious Disease

## 2015-11-16 ENCOUNTER — Encounter (HOSPITAL_BASED_OUTPATIENT_CLINIC_OR_DEPARTMENT_OTHER): Payer: Self-pay | Admitting: Clinical Social Worker

## 2015-11-16 NOTE — Progress Notes (Signed)
DOCUMENT TYPE: Social Work - Follow-up Note    PATIENT: Patrick Davis, Patrick Davis [id number]  DOB: 02/04/1961    ENCOUNTER DATE:  11/16/2015    ASSOCIATED PROGRAM-CLINIC:  [facility]    CONTACT TYPE:  Collateral    BRIEF DESCRIPTION:  PCP Collateral, Next PCP Appt, Outreach    PATIENT DESCRIPTION, PRESENTING PROBLEM, PATIENT / FAMILY GOALS:  55 y/o, AA male Stage III. Pt is homeless [facility]. PCP is MD Marygrace Droughtachel Bender Ignacio and SW is [employer]ayne Pasol.    SW Pasol received a message through Epic from MD [facility], wanting to coordinate efforts in having Pt in for medical care.    INTERVENTION / OUTCOME / PLAN:  SW Pasol scheduled next PCP Appt through PSS Supervisor Phineas Inchesiana Cady. Appointment letter will be mailed to Pt's current mailing address listed in Epic.     SW Pasol [facility] at [phone] and spoke with Administrator, sportstaff Jeff. SW Pasol left message requesting Pt contact SW Pasol back and noted he has a next scheduled appointment on 12/02/15 at 2:15p. SW Pasol made note Pt can also be seen sooner.     SW Pasol replied to MD [employer] Corporation[employer]ender Ignacio's message, providing disposition of outreach efforts. Message reply from MD Dulcy FannyBender Ignacio, acknowledging SW Pasol's outreach efforts.     Plan: SW Pasol will continue to coordinate medical care with MD Dulcy FannyBender Ignacio and medical team.    DOCUMENT COMPOSED BY:   Linnell FullingShayne Pasol, MSW on 11/16/2015

## 2015-11-28 ENCOUNTER — Inpatient Hospital Stay: Payer: Self-pay

## 2015-11-29 DIAGNOSIS — B20 Human immunodeficiency virus [HIV] disease: Secondary | ICD-10-CM

## 2015-12-01 ENCOUNTER — Ambulatory Visit (HOSPITAL_BASED_OUTPATIENT_CLINIC_OR_DEPARTMENT_OTHER): Payer: No Typology Code available for payment source | Admitting: Addiction (Substance Use Disorder)

## 2015-12-02 ENCOUNTER — Encounter (HOSPITAL_BASED_OUTPATIENT_CLINIC_OR_DEPARTMENT_OTHER): Payer: No Typology Code available for payment source | Admitting: Infectious Disease

## 2015-12-03 ENCOUNTER — Inpatient Hospital Stay: Payer: Self-pay

## 2015-12-03 NOTE — Progress Notes (Signed)
DOCUMENT TYPE:   Social Work - Housing Note    PATIENT:   Patrick Davis, Efrem L    [id number]  DOB:   07/19/1961    ENCOUNTER DATE:  11/18/2015    ASSOCIATED PROGRAM-CLINIC:  [facility]    CONTACT TYPE:  Collateral    BRIEF DESCRIPTION:  [facility] and Housing    PATIENT DESCRIPTION, PRESENTING PROBLEM, PATIENT / FAMILY GOALS:  AA male, [place], Psychiatric hx, not currently taking medication    Housing Navigator [facility] Linda    INTERVENTION / OUTCOME / PLAN:  INTERVENTION    Community education officerHousing Navigator spoke with International aid/development workerLinda at [facility]. HN explained that they had been referred this client and asked if there was any way that [facility] could help with housing. They said that they could ask to meet with their housing team and refer the client to Cornelius MorasOwen there who could help with housing.    HN and CM went to discuss how difficult it is to engage this client in housing. This client can usually only be contacted through the shelter, and usually only comes in when they need medication.      PLAN    HN will check in with Bonita QuinLinda in two weeks to see if the housing referral is a possibility. Will then begin to outreach client for VISPDAT if needed.    VISIT RELATED TO DOMESTIC VIOLENCE?  No    IS PATIENT HOMELESS?  Yes    CURRENTLY INCARCERATED / WORK RELEASE?  No    ISSUE ADDRESSED: (HOUSING)  Other    SOCIAL WORK SERVICES PROVIDED:  In English    DOCUMENT COMPOSED BY:   Britain Anagnos   on  12/02/2015

## 2015-12-03 NOTE — Progress Notes (Signed)
DOCUMENT TYPE:   Social Work - Housing Note    PATIENT:   Patrick Davis, Patrick Davis    Patrick Davis  DOB:   11/04/1960    ENCOUNTER DATE:  11/17/2015    ASSOCIATED PROGRAM-CLINIC:  [facility]    CONTACT TYPE:  Collateral    BRIEF DESCRIPTION:  [facility] Eligibility    PATIENT DESCRIPTION, PRESENTING PROBLEM, PATIENT / FAMILY GOALS:  AA male, moved here from [place], Psychiatric hx, not currently taking medication      [facility] Eligibility    INTERVENTION / OUTCOME / PLAN:  INTERVENTION    Housing Navigator called [facility] this morning to see if a client with a history of Arson would qualify for [facility]. They said that they would not.     HN staffed this with SWer Gabriel RungShayne, who has not yet engaged with the client. HN suggested that this client be reassessed since they are using again. HN explained that this may qualify [facility] more [facility]ient[facility].    HN saw in the clinical file that this client is a patient with [facility]. HN looked in Epic to see who their CM was, and found that it was Tish MenLinda Sayers.     HN called Bonita QuinLinda re: Housing options. Left VM    PLAN    HN will continue to follow up with this client    VISIT RELATED TO DOMESTIC VIOLENCE?  No    IS PATIENT HOMELESS?  Yes    CURRENTLY INCARCERATED / WORK RELEASE?  No    ISSUE ADDRESSED: (HOUSING)  Other    SOCIAL WORK SERVICES PROVIDED:  In English    DOCUMENT COMPOSED BY:   Aaron Bostwick   on  12/02/2015

## 2015-12-03 NOTE — Progress Notes (Signed)
DOCUMENT TYPE:   Social Work - Housing Note    PATIENT:   Patrick Davis, Patrick Davis    [id number]  DOB:   08/30/1960    ENCOUNTER DATE:  11/16/2015    ASSOCIATED PROGRAM-CLINIC:  [facility]    CONTACT TYPE:  Collateral    BRIEF DESCRIPTION:  [facility] Eligibility    PATIENT DESCRIPTION, PRESENTING PROBLEM, PATIENT / FAMILY GOALS:  AA male, moved here from [place], Psychiatric hx, not currently taking medication    Staffing [facility] Eligibility    INTERVENTION / OUTCOME / PLAN:  INTERVENTION    Housing Navigator was asked by Susanne BordersSWer Shayne if this client would qualify for [facility] with a history of Arson. HN did not know this answer right away.      PLAN    HN attempted to call [facility], but no one was available. HN will continue to pursue    VISIT RELATED TO DOMESTIC VIOLENCE?  No    IS PATIENT HOMELESS?  Yes    CURRENTLY INCARCERATED / WORK RELEASE?  No    ISSUE ADDRESSED: (HOUSING)  Housing education / teaching    SOCIAL WORK SERVICES PROVIDED:  In English    DOCUMENT COMPOSED BY:   Verdie Barrows   on  12/02/2015

## 2015-12-10 ENCOUNTER — Inpatient Hospital Stay: Payer: Self-pay

## 2015-12-10 DIAGNOSIS — F1590 Other stimulant use, unspecified, uncomplicated: Secondary | ICD-10-CM

## 2015-12-10 NOTE — ED Notes (Signed)
Pt reports to me that he isjust passing through and doesn't know why the police brought him here. Denied suicidal thoughts, homicidal ideation or any kind of hallucinations. Reports hx of schizophrenia, but not taking any medications for a long time. He reports using methamphetamines a lot, marijuana daily, alcohol when I can get it, and other street drugs, but is unable at this time to tell me what they are. Was cooperative with assessment.

## 2015-12-10 NOTE — ED Provider Notes (Signed)
[facility] Emergency Department Encounter      No chief complaint on file.      HPI:  Alan Bernard is a 55 y.o. male who presents to the emergency department for evaluation of erratic behavior. Please were called when the patient was found wandering in the street. EMS brings the patient here. Patient states that he has been off his Seroquel for a month and he also has been without his HIV medications for an unknown length of time. States he has HIV and schizophrenia as his medical problems. Did state that he smoked methamphetamine today. He denies hearing any voices but does state that the police are after me. Denies SI or HI at this time. During the interview the patient sat up and was grabbing his head and was repeatedly saying oh god.    History obtained from the patient.    Past medical history:  HIV  Schizophrenia        Family History:  family history is not on file.    Social History:  Patient states that he does smoke methamphetamine and most recent use was today.      Allergies:  Allergies not on file    Home Medications:  Previous Medications    No medications on file       ROS:  Please see the history of present illness and nurse's notes for pertinent positives and negatives. The remainder of a 10 point review of systems is either negative or not pertinent.      Physical Exam:       Initial Vitals   BP --    Pulse 12/10/15 2255 120   Resp --    Temp --    SpO2 12/10/15 2255 98 %     Pulse oxymetry reading is 98% on room air.    GEN:  In general the patient is alert who appears disheveled, and is in no acute distress. Lying on the couch in the psychiatric room. Appears somewhat fidgety.  HEENT: Patient is wearing an eye patch over his left eye  NECK: Supple and non-tender. No jugular venous distention appreciated.   CV: Tachycardic rate, regular rhythm. No murmurs or other abnormalities appreciated.  RESP: Lungs clear on auscultation bilaterally.  The patient is breathing comfortably and speaking in  full sentences.  ABD: Soft and non-distended with normal bowel sounds. Nontender exam.  EXT: Warm and well-perfused without significant edema.  SKIN: Warm and dry.  BACK: Non-tender.  NEURO: Alert, anxious, restlessness. Moving all extremities purposefully.       Differential Diagnosis:  Meth psychosis, uncontrolled schizophrenia, psychosis NOS, polysubstance abuse    ED Course:   The patient arrived by ambulance and is alone. The patient was triaged to room 603.  The patient was maintained on appropriate monitoring and was treated with oral Ativan.    Results:     Labs:  Results for orders placed or performed during the hospital encounter of 12/10/15 (from the past 8 hour(s))   Drug Screen Panel 8 -STAT    Collection Time: 12/11/15  1:47 AM   Result Value Ref Range    Creatinine, Urine 63 >20 mg/dL    Amphetamine Positive (A) Negative    Barbiturates Negative Negative    Benzodiazepines Negative Negative    Cocaine Negative Negative    Marijuana Positive (A) Negative    Opiates Negative Negative    Oxycodone Negative Negative    Methadone Negative Negative       Medical  Decision Making:     The patient presents with agitation and paranoia in the setting of reported schizophrenia and noncompliance with his Seroquel, likely exacerbated by his reported methamphetamine use today. Urine drug screen is pending. In the meantime he seemed agreeable initially to taking an oral medication to help him calm down. He is redirectable and calm at this time but EMS reported that he was somewhat agitated and aggressive earlier.    Urine drug screen came back positive for methamphetamines. He'll be sent to the [facility] for sobering.    ED Meds Given:  Medications   LORazepam (ATIVAN) tablet 1 mg (1 mg Oral Given 12/10/15 2340)       Rx Given:  New Prescriptions    No medications on file       Disposition:  Transfer [facility]    Condition:  Stable    Impression:    SNOMED CT(R)   1. Methamphetamine use  METHAMPHETAMINE  DEPENDENCE   2. Psychosis, unspecified psychosis type  PSYCHOTIC DISORDER       NOTE:  This dictation was produced using voice recognition software. Although effort has been made to minimize transcription errors, homonyms and other transcription errors may be present and may not truly reflect my intent.     Norville Haggard, MD  12/11/15 Earle Gell

## 2015-12-10 NOTE — ED Notes (Signed)
Cooperative with taking medication. Given boxed lunch and more juice. Unable to urinate at this time.

## 2015-12-10 NOTE — ED Notes (Signed)
Security check is done,belongings are in locker 603.  Pt. Requested a drink and something to eat.

## 2015-12-10 NOTE — ED Notes (Signed)
PCU report from RN to ED Dr.    Coralie Keens in by: [employer], ambulance    Type of hold: Police Hold    Chief complaint and why did they come into the ED: bizarre behavior, meth high    Mental Health diagnosis: schizophrenia    Brief history of self harm/suicide attempts/harming others? unknown    Medical diagnosis: unknown    Collateral information from family/friends: none    [facility]    MPD received several calls on pt running in and out of street at [place] and [place]. Pt says he is very high on meth. He hasn't eaten in 3 days, or slept. Ambulance says don't poke the bear -- the pt will fight back. He has been calm and coop here in PCU.

## 2015-12-10 NOTE — ED Notes (Signed)
MD at bedside. 

## 2015-12-10 NOTE — ED Notes (Signed)
Vitals given were per EMS. Pt. Currently is to animated to get accurate  v/s.

## 2015-12-11 ENCOUNTER — Inpatient Hospital Stay: Payer: Self-pay

## 2015-12-11 ENCOUNTER — Inpatient Hospital Stay: Admit: 2015-12-11 | Discharge: 2015-12-11

## 2015-12-11 LAB — DRUG SCREEN PANEL 8
Amphetamine: POSITIVE — AB
Barbiturates: NEGATIVE
Benzodiazepines: NEGATIVE
Cocaine: NEGATIVE
Creatinine, Urine: 63 mg/dL (ref >20)
Marijuana: POSITIVE — AB
Methadone: NEGATIVE
Opiates: NEGATIVE
Oxycodone: NEGATIVE

## 2015-12-11 MED ORDER — LORazepam (ATIVAN) tablet 1 mg
1 | Freq: Once | ORAL | Status: AC
Start: 2015-12-11 — End: 2015-12-10
  Administered 2015-12-11: 07:00:00 1 mg via ORAL

## 2015-12-11 MED FILL — LORAZEPAM 1 MG TABLET: 1 mg | ORAL | Qty: 1

## 2015-12-11 NOTE — ED Notes (Signed)
Pt positive for meth, [employer] paper work faxed.  MPD called for transport.

## 2015-12-11 NOTE — ED Notes (Signed)
Given more juice. Unable to urinate yet.

## 2015-12-11 NOTE — ED Notes (Signed)
No suicidal or homicidal ideation. Denies audio/visual hallucinations. AVS reviewed and put in his bag of belongings that were sent to [facility] with him. Cooperative with [employer] upon discharge.

## 2015-12-11 NOTE — Discharge Instructions (Signed)
Psychosis  Psychosis refers to a severe loss of contact with reality. During a psychotic episode, a person is not able to think clearly, and his or her emotions and responses do not match up with what is actually happening. Someone may have false beliefs about what is happening or who they are (delusions). Someone may see, hear, taste, smell, or feel things that are not present (hallucinations).   Psychosis usually occurs with very serious mental health (psychiatric) conditions such as schizophrenia, bipolar disorder, or major depression. It can sometimes also be the result of drug use or certain medical conditions.  SYMPTOMS  Symptoms of a psychotic episode include:  ? Delusions, such as:    Feeling excessive fear or suspicion (paranoia).    Believing something that is odd, unrealistic, or false, such as having a false belief about being someone else.  ? Hallucinations.  ? Disorganized thinking, such as thoughts that jump from one to another that do not make sense to others.  ? Disorganized speech, such as saying things that do not make sense to others.  ? Inappropriate behavior, such as talking to oneself or intruding on unfamiliar people.  DIAGNOSIS  A diagnosis of psychosis is made through an assessment by a health care provider, who will ask questions about thoughts, feelings, behavior, drug use, and medical conditions. The health care provider may also do one or more of the following:  ? Physical exam.  ? Blood tests.  ? Brain imaging, such as a CT scan or MRI.  ? Brain wave study (EEG).  The health care provider may make a referral for further evaluation by a mental health professional.  TREATMENT   Treatment depends on the cause of the psychosis. Treatment may include one or more of the following:  ? Monitoring and supportive care in the emergency room or hospital. ?  ? Taking medicines (antipsychotic medicine) to reduce symptoms and to balance chemicals in the brain.  ? Treating an underlying medical  condition.  ? Stopping or reducing drugs that are causing psychosis.  ? Therapy and other supportive programs outside of the hospital.  HOME CARE INSTRUCTIONS  ? Over-the-counter and prescription medicines should be taken only as told by the health care provider.  ? The health care provider should be consulted before over-the-counter medicines, herbs, or supplements are used.  ? All follow-up visits should be kept as told by the health care provider. This is important.  ? A healthy lifestyle should be maintained. This includes:    Eating a healthy diet.    Getting enough sleep.    Exercising regularly.    Avoiding alcohol and recreational drugs as told by the health care provider.  SEEK MEDICAL CARE IF:  ? Medicines do not seem to be helping.  ? The person hears voices telling him or her to do things.  ? The person continues to see, smell, or feel things that are not there.  ? The person feels extremely fearful and suspicious that someone or something will harm him or her.  ? The person feels unable to leave his or her house.  ? The person has trouble taking care of himself or herself.  ? The person experiences side effects of medicines, such as:    Changes in sleep patterns.    Dizziness.    Weight gain.    Restlessness.    Movement changes.    Muscle spasms.    Tremors.  SEEK IMMEDIATE MEDICAL CARE IF:  ? Serious thoughts   occur about self-harm or about hurting others.  ? There are serious side effects of medicine, such as:    Swelling of the face, lips, tongue, or throat.    Fever, confusion, muscle spasms, or seizures.     This information is not intended to replace advice given to you by your health care provider. Make sure you discuss any questions you have with your health care provider.     Document Released: 01/05/2010 Document Revised: 12/02/2014 Document Reviewed: 07/22/2014  Elsevier Interactive Patient Education ?2016 Elsevier Inc.    Stimulant Use Disorder-Methamphetamines  Methamphetamines are one of a  group of powerful drugs known as stimulants. Methamphetamine is a form of amphetamine. It is rarely used medically but is often misused because of the effects it produces. These effects include:  ? A feeling of extreme pleasure (euphoria).  ? Alertness.  ? High energy.  ? Increased sexuality.  Common street names for methamphetamine are meth, crystal, ice, glass, and chalk. This drug can be taken by mouth, smoked, snorted, or dissolved in water and injected.  Stimulants are addictive because they activate regions of the brain that are responsible for producing both the pleasurable sensation of reward and psychological dependence. Together, these actions account for loss of control and the rapid development of drug dependence. This means the user will become ill without the drug (withdrawal) and will need to keep using it to function.   Stimulant use disorder is use of stimulants that disrupts your daily life. It disrupts relationships with family and friends and how you do your job. Methamphetamine increases blood pressure and heart rate. Use can cause a heart attack or stroke. Methamphetamine can also cause death from irregular heart rate or seizures.  SIGNS AND SYMPTOMS   Signs and symptoms of stimulant use disorder with methamphetamine include:  ? Use of methamphetamines in larger amounts or over a longer period than intended.  ? Unsuccessful attempts to cut down or control methamphetamine use.  ? A lot of time spent obtaining, using, or recovering from the effects of methamphetamines.  ? A strong desire or urge to use methamphetamines (craving).  ? Continued use of methamphetamines in spite of major problems at work, school, or home because of use.  ? Continued use of methamphetamines in spite of relationship problems because of use.  ? Giving up or cutting down on important life activities because of use of methamphetamines.  ? Use of methamphetamines over and over in situations when it is physically  hazardous, such as driving a car.  ? Continued use of methamphetamines in spite of a physical problem that is likely related to use. Physical problems can include:    Extreme weight loss.    Malnutrition.    Jaw clenching.    Severe dental problems (meth mouth).    Lung problems (due to smoking).    Skin sores (due to scratching).    Infections such as human immunodeficiency virus (HIV) and hepatitis (from injecting methamphetamines).  ? Continued use of methamphetamines in spite of a mental problem that is likely related to use. Mental problems can include:    Memory problems.    Schizophrenia-like symptoms.    Depression.    Bipolar mood swings.    Violent behavior.    Anxiety.    Sleep problems.  ? Need to use more and more methamphetamines to get the same effect, or lessened effect over time with use of the same amount (tolerance).  ? Having withdrawal symptoms when methamphetamine   use is stopped, or using methamphetamines to reduce or avoid withdrawal symptoms. Withdrawal symptoms include:    Depressed or irritable mood.    Low energy or restlessness.    Bad dreams.    Too little or too much sleep.    Increased appetite.  DIAGNOSIS  Stimulant use disorder is diagnosed by your health care provider. You may be asked questions about your methamphetamine use and how it affects your life. A physical exam may be done. A drug screen may be ordered. You may be referred to a mental health professional. The diagnosis of stimulant use disorder requires at least two symptoms within 12 months. The type of stimulant use disorder depends on the number of symptoms you have. The type may be:  ? Mild. Two or three signs and symptoms.  ? Moderate. Four or five signs and symptoms.  ? Severe. Six or more signs and symptoms.  TREATMENT   There are two types of treatment:   ? Short-term (urgent) medical treatment. This helps to preserve life and prevent or minimize damage from the physical or mental problems related to  methamphetamine use. ?  ? Long-term substance abuse treatment. This focuses on recovery from use disorder. It is provided by mental health professionals who have training in substance use disorders. It is usually a combination of counseling, support groups, and nonaddictive medicines that can reduce cravings or block the effects of methamphetamine.  HOME CARE INSTRUCTIONS   ? Take medicines only as directed by your health care provider.  ? Identify the people and activities that trigger your methamphetamine use and avoid them.  ? Keep all follow-up visits as directed by your health care provider.  ? Brush and floss your teeth every day. Do this 2 times a day if you can. Also use an antibacterial mouthwash.  ? Avoid sugary drinks.  ? Do not smoke.  SEEK MEDICAL CARE IF:  ? Your symptoms get worse or you relapse.  ? You are not able to take medicines as directed. ?  SEEK IMMEDIATE MEDICAL CARE IF:   ? You have serious thoughts about hurting yourself or others.  ? You have a seizure, chest pain, sudden weakness, or loss of speech or vision.  FOR MORE INFORMATION  ? National Institute on Drug Abuse: www.drugabuse.gov  ? Substance Abuse and Mental Health Services Administration: www.samhsa.gov     This information is not intended to replace advice given to you by your health care provider. Make sure you discuss any questions you have with your health care provider.     Document Released: 03/23/2004 Document Revised: 08/08/2014 Document Reviewed: 07/31/2013  Elsevier Interactive Patient Education ?2016 Elsevier Inc.

## 2015-12-22 ENCOUNTER — Inpatient Hospital Stay: Payer: Self-pay

## 2016-01-08 ENCOUNTER — Encounter (HOSPITAL_BASED_OUTPATIENT_CLINIC_OR_DEPARTMENT_OTHER): Payer: Self-pay | Admitting: Addiction (Substance Use Disorder)

## 2016-01-08 ENCOUNTER — Telehealth (HOSPITAL_BASED_OUTPATIENT_CLINIC_OR_DEPARTMENT_OTHER): Payer: Self-pay | Admitting: Addiction (Substance Use Disorder)

## 2016-01-08 NOTE — Progress Notes (Signed)
[facility] Termination Summary      Duration of treatment (years): One year               Date of exit: 01/08/16    Client's reason for presenting for treatment:  "I'm looking to be able to continue to get my medication and to see a doctor on a regular basis. Case management would be good. I need housing. I don't need counseling or therapy."        Significant findings of initial assessment (clinician's assessment): Major depressive disorder. 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 Cannabis use with psychotic disorder. Cocaine abuse. Episodic. Intellectual disability. Schizophrenia.         Course of treatment: Ongoing psychiatric evaluation, symptom monitoring, medication management, case management support, [facility] in [facility] for [facility].     Known medication sensitivities/allergies: NKA    Reason for termination: [place] out of state. Most recently [facility] in [place].     Condition at discharge and current needs assessment: Client needs to be psychiatrically evaluated and to follow the recommendations of his prescriber.     Attempts to engage client if treatment is being discontinued AMA: TW called Carmell AustriaKaren Adler, [facility], in an attempt to contact the client. Client wasn't at the shelter.    Transferred/discharged to: [facility]_  Spoke with: NA on (date) NA  and exchanged the following clinical information: NA    Diagnosis at termination: Same as initial assessment.      Type X next to appropriate choice  Is [facility] the payee for this client?  [ ]  Yes (Media plannercontact Payee Fiscal Specialist)  [x_] No        [facility]?  [_] Yes Research scientist (life sciences)(contact Housing Coordinator)  [xx_] No    Considerations if client returns for treatment:    [x_] Primary substance abuse  [_] History of violence in clinic  [x_] Noncompliance   [x_] Poor investment in treatment  [_] No concerns for client to  return [x_] Other: Significant history of pyromania._     -Further comments about client returning to treatment: Imperative that client establishes and maintains medication compliance to maintain symptom stability. Client acknowledged that his current medication regimen helped him to manage his tendency to become angry quickly.     Recommendations for future treatment:    [_] Representative payee   [_] Employment Services  [x_] Chemical dependency treatment  [_] None  [_] Supported housing/residential placement [x_] Other: Subsidized housing, regular appointments with psychiatrist,  case IT trainermanager and nurse.      Please forward this document to your supervisor for co-signature.    Electronically signed by:   Tish MenLinda Sayers, MA CDP  [facility]  [phone]  [place], [place] [id number]

## 2016-01-25 ENCOUNTER — Encounter (HOSPITAL_BASED_OUTPATIENT_CLINIC_OR_DEPARTMENT_OTHER): Payer: Self-pay

## 2016-01-25 NOTE — Progress Notes (Signed)
DOCUMENT TYPE: Social Work - Housing Outtake    PATIENT: Patrick Davis, Patrick Davis [id number]  DOB: [birth year]    ENCOUNTER DATE:  01/25/2016    ASSOCIATED PROGRAM-CLINIC:  [place]    CONTACT TYPE:  Collateral    BRIEF DESCRIPTION:  Housing Outtake    PATIENT DESCRIPTION, PRESENTING PROBLEM, PATIENT / FAMILY GOALS:  AA male, moved here from [place], Psychiatric hx, not currently taking medication      Housing Outtake    INTERVENTION / OUTCOME / PLAN:  [place] Navigator informed SWer Huey Romans that this pt will no longer qualify for Housing Navigation services under the new guidelines due to: Pt has arson history that may even preclude housing options through [place] will be closed.    PROGRAM EXIT DATE:  01/26/2016    HN ASSISTED WITH HOUSING PLACEMENT?  No    DESTINATION (WHERE IS THE ELIGIBLE INDIVIDUAL GOING AFTER LEAVING THE PROGRAM?) :  Don't Know    REASON CLIENT LEFT PROGRAM:  Needs could not be met by program    VISIT RELATED TO DOMESTIC VIOLENCE?  No    IS PATIENT HOMELESS?  Yes    CURRENTLY INCARCERATED / WORK RELEASE?  No    ISSUE ADDRESSED: (HOUSING)  Other    SOCIAL WORK SERVICES PROVIDED:  In English    DOCUMENT COMPOSED BY:   Clarabell Matsuoka on 01/25/2016

## 2016-02-26 ENCOUNTER — Inpatient Hospital Stay: Payer: Self-pay

## 2016-06-10 ENCOUNTER — Emergency Department (HOSPITAL_COMMUNITY): Payer: MEDICAID

## 2016-06-10 ENCOUNTER — Observation Stay (HOSPITAL_COMMUNITY): Payer: MEDICAID

## 2016-06-10 ENCOUNTER — Encounter (HOSPITAL_COMMUNITY): Payer: Self-pay | Admitting: "Endocrinology

## 2016-06-10 ENCOUNTER — Inpatient Hospital Stay
Admission: EM | Admit: 2016-06-10 | Discharge: 2016-06-15 | DRG: 602 | Disposition: A | Discharge status: Home / Self Care | Payer: MEDICAID | Attending: Geriatric Medicine | Admitting: Geriatric Medicine

## 2016-06-10 DIAGNOSIS — F329 Major depressive disorder, single episode, unspecified: Secondary | ICD-10-CM

## 2016-06-10 DIAGNOSIS — Z79899 Other long term (current) drug therapy: Secondary | ICD-10-CM

## 2016-06-10 DIAGNOSIS — Y9289 Other specified places as the place of occurrence of the external cause: Secondary | ICD-10-CM

## 2016-06-10 DIAGNOSIS — Z66 Do not resuscitate: Secondary | ICD-10-CM | POA: Diagnosis present

## 2016-06-10 DIAGNOSIS — B1920 Unspecified viral hepatitis C without hepatic coma: Secondary | ICD-10-CM

## 2016-06-10 DIAGNOSIS — Z8701 Personal history of pneumonia (recurrent): Secondary | ICD-10-CM

## 2016-06-10 DIAGNOSIS — Z5900 Homelessness unspecified: Secondary | ICD-10-CM

## 2016-06-10 DIAGNOSIS — E162 Hypoglycemia, unspecified: Secondary | ICD-10-CM | POA: Diagnosis present

## 2016-06-10 DIAGNOSIS — R600 Localized edema: Secondary | ICD-10-CM

## 2016-06-10 DIAGNOSIS — G2401 Drug induced subacute dyskinesia: Secondary | ICD-10-CM | POA: Diagnosis present

## 2016-06-10 DIAGNOSIS — F1990 Other psychoactive substance use, unspecified, uncomplicated: Secondary | ICD-10-CM

## 2016-06-10 DIAGNOSIS — R64 Cachexia: Secondary | ICD-10-CM | POA: Diagnosis present

## 2016-06-10 DIAGNOSIS — B20 Human immunodeficiency virus [HIV] disease: Secondary | ICD-10-CM

## 2016-06-10 DIAGNOSIS — F1510 Other stimulant abuse, uncomplicated: Secondary | ICD-10-CM | POA: Diagnosis present

## 2016-06-10 DIAGNOSIS — L03113 Cellulitis of right upper limb: Secondary | ICD-10-CM | POA: Diagnosis present

## 2016-06-10 DIAGNOSIS — B182 Chronic viral hepatitis C: Secondary | ICD-10-CM | POA: Diagnosis present

## 2016-06-10 DIAGNOSIS — Z9114 Patient's other noncompliance with medication regimen: Secondary | ICD-10-CM

## 2016-06-10 DIAGNOSIS — D649 Anemia, unspecified: Secondary | ICD-10-CM | POA: Diagnosis present

## 2016-06-10 DIAGNOSIS — Z8611 Personal history of tuberculosis: Secondary | ICD-10-CM

## 2016-06-10 DIAGNOSIS — B9689 Other specified bacterial agents as the cause of diseases classified elsewhere: Secondary | ICD-10-CM | POA: Diagnosis present

## 2016-06-10 DIAGNOSIS — H5462 Unqualified visual loss, left eye, normal vision right eye: Secondary | ICD-10-CM | POA: Diagnosis present

## 2016-06-10 DIAGNOSIS — K5900 Constipation, unspecified: Secondary | ICD-10-CM | POA: Diagnosis present

## 2016-06-10 DIAGNOSIS — N138 Other obstructive and reflux uropathy: Secondary | ICD-10-CM

## 2016-06-10 DIAGNOSIS — R197 Diarrhea, unspecified: Secondary | ICD-10-CM | POA: Diagnosis present

## 2016-06-10 DIAGNOSIS — R3911 Hesitancy of micturition: Secondary | ICD-10-CM | POA: Diagnosis present

## 2016-06-10 DIAGNOSIS — F17210 Nicotine dependence, cigarettes, uncomplicated: Secondary | ICD-10-CM | POA: Diagnosis present

## 2016-06-10 DIAGNOSIS — M7989 Other specified soft tissue disorders: Secondary | ICD-10-CM

## 2016-06-10 DIAGNOSIS — N401 Enlarged prostate with lower urinary tract symptoms: Secondary | ICD-10-CM | POA: Diagnosis present

## 2016-06-10 DIAGNOSIS — F32A Depression, unspecified: Secondary | ICD-10-CM

## 2016-06-10 DIAGNOSIS — T43595A Adverse effect of other antipsychotics and neuroleptics, initial encounter: Secondary | ICD-10-CM | POA: Diagnosis present

## 2016-06-10 DIAGNOSIS — L0291 Cutaneous abscess, unspecified: Secondary | ICD-10-CM

## 2016-06-10 DIAGNOSIS — L0390 Cellulitis, unspecified: Secondary | ICD-10-CM | POA: Diagnosis present

## 2016-06-10 DIAGNOSIS — Z720 Tobacco use: Secondary | ICD-10-CM

## 2016-06-10 DIAGNOSIS — R300 Dysuria: Secondary | ICD-10-CM | POA: Diagnosis present

## 2016-06-10 DIAGNOSIS — B955 Unspecified streptococcus as the cause of diseases classified elsewhere: Secondary | ICD-10-CM | POA: Diagnosis present

## 2016-06-10 DIAGNOSIS — L02413 Cutaneous abscess of right upper limb: Principal | ICD-10-CM | POA: Diagnosis present

## 2016-06-10 DIAGNOSIS — R3912 Poor urinary stream: Secondary | ICD-10-CM | POA: Diagnosis present

## 2016-06-10 DIAGNOSIS — Z681 Body mass index (BMI) 19 or less, adult: Secondary | ICD-10-CM

## 2016-06-10 DIAGNOSIS — R634 Abnormal weight loss: Secondary | ICD-10-CM

## 2016-06-10 DIAGNOSIS — F200 Paranoid schizophrenia: Secondary | ICD-10-CM | POA: Diagnosis present

## 2016-06-10 HISTORY — DX: Unspecified viral hepatitis C without hepatic coma: B19.20

## 2016-06-10 HISTORY — DX: Paranoid schizophrenia (CMS-HCC): F20.0

## 2016-06-10 HISTORY — DX: Human immunodeficiency virus (HIV) disease (CMS-HCC): B20

## 2016-06-10 LAB — URINALYSIS WITH CULTURE REFLEX, WHEN INDICATED
Bilirubin: NEGATIVE
Blood: NEGATIVE
Glucose: NEGATIVE
Ketones: NEGATIVE
Leuk Esterase: NEGATIVE
Nitrite: NEGATIVE
Protein: NEGATIVE
Specific Gravity: 1.01 (ref 1.002–1.030)
pH: 7 (ref 5.0–8.0)

## 2016-06-10 LAB — CBC WITH DIFF, BLOOD
ANC-Automated: 5.2 10*3/uL (ref 1.6–7.0)
Abs Eosinophils: 0.1 10*3/uL (ref 0.1–0.5)
Abs Lymphs: 2.7 10*3/uL (ref 0.8–3.1)
Abs Monos: 0.7 10*3/uL (ref 0.2–0.8)
Eosinophils: 1 %
Hct: 33.1 % — ABNORMAL LOW (ref 40.0–50.0)
Hgb: 10.9 gm/dL — ABNORMAL LOW (ref 13.7–17.5)
Lymphocytes: 31 %
MCH: 26.4 pg (ref 26.0–32.0)
MCHC: 32.9 g/dL (ref 32.0–36.0)
MCV: 80.1 um3 (ref 79.0–95.0)
MPV: 9.5 fL (ref 9.4–12.4)
Monocytes: 8 %
Plt Count: 371 10*3/uL — ABNORMAL HIGH (ref 140–370)
RBC: 4.13 10*6/uL — ABNORMAL LOW (ref 4.60–6.10)
RDW: 15.1 % — ABNORMAL HIGH (ref 12.0–14.0)
Segs: 59 %
WBC: 8.8 10*3/uL (ref 4.0–10.0)

## 2016-06-10 LAB — UR DRUGS OF ABUSE SCREEN
Barbiturates Screen: NEGATIVE
Benzodiazepine Screen: NEGATIVE
Cocaine Screen: NEGATIVE
Methadone Screen: NEGATIVE
Opiates Screen: NEGATIVE
Oxycodone Screen: NEGATIVE
Phencyclidine Screen: NEGATIVE
THC Screen: NEGATIVE

## 2016-06-10 LAB — COMPREHENSIVE METABOLIC PANEL, BLOOD
ALT (SGPT): 22 U/L (ref 0–41)
AST (SGOT): 35 U/L (ref 0–40)
Albumin: 3.4 g/dL — ABNORMAL LOW (ref 3.5–5.2)
Alkaline Phos: 76 U/L (ref 40–129)
Anion Gap: 12 mmol/L (ref 7–15)
BUN: 10 mg/dL (ref 6–20)
Bicarbonate: 25 mmol/L (ref 22–29)
Bilirubin, Tot: 0.52 mg/dL (ref <1.2)
Calcium: 9.1 mg/dL (ref 8.5–10.6)
Chloride: 94 mmol/L — ABNORMAL LOW (ref 98–107)
Creatinine: 0.69 mg/dL (ref 0.67–1.17)
GFR: >60 mL/min
Glucose: 56 mg/dL — CL (ref 70–99)
Potassium: 4.2 mmol/L (ref 3.5–5.1)
Sodium: 131 mmol/L — ABNORMAL LOW (ref 136–145)
Total Protein: 9.3 g/dL — ABNORMAL HIGH (ref 6.0–8.0)

## 2016-06-10 LAB — GLUCOSE (POCT)
Glucose (POCT): 123 mg/dL — ABNORMAL HIGH (ref 70–99)
Glucose (POCT): 167 mg/dL — ABNORMAL HIGH (ref 70–99)

## 2016-06-10 LAB — C-REACTIVE PROTEIN, BLOOD: CRP: 16.3 mg/dL — ABNORMAL HIGH (ref <0.5)

## 2016-06-10 MED ORDER — LACTATED RINGERS IV SOLN
Freq: Once | INTRAVENOUS | Status: AC
Start: 2016-06-10 — End: 2016-06-10
  Administered 2016-06-10: 13:00:00 via INTRAVENOUS

## 2016-06-10 MED ORDER — SODIUM CHLORIDE 0.9 % IV SOLN
12.5000 mg | Freq: Once | INTRAVENOUS | Status: DC
Start: 2016-06-10 — End: 2016-06-10

## 2016-06-10 MED ORDER — GLUCOSE 4 GM PO CHEW (CUSTOM)
4.0000 | CHEWABLE_TABLET | ORAL | Status: DC | PRN
Start: 2016-06-10 — End: 2016-06-10

## 2016-06-10 MED ORDER — GLUCAGON HCL (RDNA) 1 MG IJ SOLR
1.0000 mg | Freq: Once | INTRAMUSCULAR | Status: DC | PRN
Start: 2016-06-10 — End: 2016-06-10

## 2016-06-10 MED ORDER — VANCOMYCIN HCL 1000 MG IV SOLR
1000.0000 mg | Freq: Once | INTRAVENOUS | Status: DC
Start: 2016-06-10 — End: 2016-06-10

## 2016-06-10 MED ORDER — SODIUM CHLORIDE 0.9 % IJ SOLN (CUSTOM)
3.0000 mL | Freq: Three times a day (TID) | INTRAMUSCULAR | Status: DC
Start: 2016-06-10 — End: 2016-06-15
  Administered 2016-06-10 – 2016-06-15 (×14): 3 mL via INTRAVENOUS

## 2016-06-10 MED ORDER — ACETAMINOPHEN 160 MG/5ML OR SOLN
650.0000 mg | ORAL | Status: DC | PRN
Start: 2016-06-10 — End: 2016-06-15

## 2016-06-10 MED ORDER — HYDROMORPHONE HCL 1 MG/ML IJ SOLN
1.0000 mg | Freq: Once | INTRAMUSCULAR | Status: AC
Start: 2016-06-10 — End: 2016-06-10
  Administered 2016-06-10: 1 mg via INTRAVENOUS
  Filled 2016-06-10: qty 1

## 2016-06-10 MED ORDER — SODIUM CHLORIDE 0.9% TKO INFUSION
INTRAVENOUS | Status: DC | PRN
Start: 2016-06-10 — End: 2016-06-15

## 2016-06-10 MED ORDER — TAMSULOSIN HCL 0.4 MG PO CAPS
0.4000 mg | ORAL_CAPSULE | Freq: Every day | ORAL | Status: DC
Start: 2016-06-11 — End: 2016-06-15
  Administered 2016-06-11 – 2016-06-15 (×5): 0.4 mg via ORAL
  Filled 2016-06-10 (×5): qty 1

## 2016-06-10 MED ORDER — ACETAMINOPHEN 650 MG RE SUPP
650.0000 mg | RECTAL | Status: DC | PRN
Start: 2016-06-10 — End: 2016-06-15

## 2016-06-10 MED ORDER — HEPATITIS A VACCINE 1440 EL U/ML IM SUSP (COUNTY PROVIDED)
1440.0000 [IU] | Freq: Once | INTRAMUSCULAR | Status: DC
Start: 2016-06-10 — End: 2016-06-10

## 2016-06-10 MED ORDER — SERTRALINE HCL 50 MG OR TABS
50.0000 mg | ORAL_TABLET | Freq: Every evening | ORAL | Status: DC
Start: 2016-06-10 — End: 2016-06-15
  Administered 2016-06-10 – 2016-06-14 (×5): 50 mg via ORAL
  Filled 2016-06-10 (×5): qty 1

## 2016-06-10 MED ORDER — DEXTROSE 50 % IV SOLN
12.5000 g | INTRAVENOUS | Status: DC | PRN
Start: 2016-06-10 — End: 2016-06-10

## 2016-06-10 MED ORDER — VANCOMYCIN HCL 1000 MG IV SOLR
1000.0000 mg | Freq: Three times a day (TID) | INTRAVENOUS | Status: DC
Start: 2016-06-11 — End: 2016-06-13
  Administered 2016-06-11 – 2016-06-13 (×7): 1000 mg via INTRAVENOUS
  Filled 2016-06-10 (×7): qty 1000

## 2016-06-10 MED ORDER — LIDOCAINE-EPINEPHRINE 1 %-1:100000 IJ SOLN
10.0000 mL | Freq: Once | INTRAMUSCULAR | Status: AC
Start: 2016-06-10 — End: 2016-06-10
  Administered 2016-06-10: 10 mL via INTRADERMAL
  Filled 2016-06-10: qty 20

## 2016-06-10 MED ORDER — NALOXONE HCL 0.4 MG/ML IJ SOLN
0.1000 mg | INTRAMUSCULAR | Status: DC | PRN
Start: 2016-06-10 — End: 2016-06-15

## 2016-06-10 MED ORDER — DEXTROSE (DIABETIC USE) 40 % OR GEL
1.0000 | ORAL | Status: DC | PRN
Start: 2016-06-10 — End: 2016-06-11

## 2016-06-10 MED ORDER — STERILE WATER FOR INJECTION IJ SOLN
1000.0000 mg | Freq: Once | INTRAMUSCULAR | Status: DC
Start: 2016-06-10 — End: 2016-06-10

## 2016-06-10 MED ORDER — VANCOMYCIN HCL 1000 MG IV SOLR
1000.0000 mg | Freq: Three times a day (TID) | INTRAVENOUS | Status: DC
Start: 2016-06-10 — End: 2016-06-10

## 2016-06-10 MED ORDER — SODIUM CHLORIDE 0.9 % IJ SOLN (CUSTOM)
3.0000 mL | INTRAMUSCULAR | Status: DC | PRN
Start: 2016-06-10 — End: 2016-06-15

## 2016-06-10 MED ORDER — ONDANSETRON HCL 4 MG/2ML IV SOLN
4.0000 mg | Freq: Four times a day (QID) | INTRAMUSCULAR | Status: AC | PRN
Start: 2016-06-10 — End: 2016-06-10

## 2016-06-10 MED ORDER — GLUCAGON HCL (RDNA) 1 MG IJ SOLR
1.0000 mg | Freq: Once | INTRAMUSCULAR | Status: DC | PRN
Start: 2016-06-10 — End: 2016-06-11

## 2016-06-10 MED ORDER — GLUCOSE 4 GM PO CHEW (CUSTOM)
4.0000 | CHEWABLE_TABLET | ORAL | Status: DC | PRN
Start: 2016-06-10 — End: 2016-06-11

## 2016-06-10 MED ORDER — DEXTROSE 50 % IV SOLN
12.5000 g | INTRAVENOUS | Status: DC | PRN
Start: 2016-06-10 — End: 2016-06-11

## 2016-06-10 MED ORDER — DEXTROSE (DIABETIC USE) 40 % OR GEL
1.0000 | ORAL | Status: DC | PRN
Start: 2016-06-10 — End: 2016-06-10

## 2016-06-10 MED ORDER — VANCOMYCIN HCL 1000 MG IV SOLR
1250.0000 mg | Freq: Once | INTRAVENOUS | Status: AC
Start: 2016-06-10 — End: 2016-06-10
  Administered 2016-06-10: 1250 mg via INTRAVENOUS
  Filled 2016-06-10: qty 750

## 2016-06-10 MED ORDER — ENOXAPARIN SODIUM 40 MG/0.4ML SC SOLN
40.0000 mg | Freq: Every day | SUBCUTANEOUS | Status: DC
Start: 2016-06-11 — End: 2016-06-15
  Administered 2016-06-11 – 2016-06-13 (×3): 40 mg via SUBCUTANEOUS
  Filled 2016-06-10 (×5): qty 1

## 2016-06-10 MED ORDER — PROCHLORPERAZINE EDISYLATE 5 MG/ML IJ SOLN
10.0000 mg | Freq: Once | INTRAMUSCULAR | Status: AC
Start: 2016-06-10 — End: 2016-06-10
  Administered 2016-06-10: 10 mg via INTRAVENOUS
  Filled 2016-06-10: qty 2

## 2016-06-10 MED ORDER — DIPHENHYDRAMINE HCL 50 MG/ML IJ SOLN
50.0000 mg | Freq: Once | INTRAMUSCULAR | Status: AC
Start: 2016-06-10 — End: 2016-06-10
  Administered 2016-06-10: 50 mg via INTRAVENOUS
  Filled 2016-06-10: qty 50

## 2016-06-10 MED ORDER — HYDROMORPHONE HCL 1 MG/ML IJ SOLN
1.0000 mg | INTRAMUSCULAR | Status: AC | PRN
Start: 2016-06-10 — End: 2016-06-10

## 2016-06-10 MED ORDER — ACETAMINOPHEN 325 MG PO TABS
650.0000 mg | ORAL_TABLET | ORAL | Status: DC | PRN
Start: 2016-06-10 — End: 2016-06-15
  Administered 2016-06-11: 650 mg via ORAL
  Filled 2016-06-10: qty 2

## 2016-06-10 NOTE — ED Notes (Signed)
Pt states he is homeless, thinks he was bitten by a spider about 4 days ago.  lg abscess with dried serous drainage noted to RUA.  Pt hx hiv+/schizophrenia, states is taking medication for both but is unable to provide specifics.

## 2016-06-10 NOTE — H&P (Signed)
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 Patient reports that at baseline he tries to inject meth into his arm and leg veins. Reports that 1 week ago he tried to inject into his right arm which since then has started to be edematous, warm, and erythematous. Denies loss of feeling in right hand/numbness/tingling/difficulty moving fingers. Denies fever/chills/ns.   Otherwise on ROS reports difficulty with initiating urination with poor stream. Chronic. Reports also burning with urination. Reports that this is chronic but also reports that he thinks this related to meth. Denies any penile lesions or discharges  Denies ab pain n/v. Reports some constipation. No diarrhea. No melena/hematochezia.   No other lesions on skin.  No headache. 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   No dysphagia/odynophagia.   20lb wt loss in a few weeks. No cough. No fever/chills/night sweats.     HIV hx - reports dx 1992, risk factor MSM, reports at that time CD 4 low at 20?, reports in 1996 had episode of PJP pneumonia and possibly TB. Denies hx of other OIs. Reports being on ARV since ~1996. Reports possibly previously on Truvada. On Triumeq for about 1 year. Reports missed doses over the past few months, maybe once a week, but also reports having missed maybe a week over the past month. Otherwise reports also hx of syphilis and other STIs. Not currently sexually active.      HCV - reports dx in the early 2000s, treatment-naiive. Denies ever been told of cirrhosis or other complications.     Patient reports that he was living in [place] for the  past month, just moved to [place]. Prior to [place] was in [place]6 mo - that's where he got his supply of Triumeq from. Prior to that living in [place] for 2 yrs. Currently would like to stay in [place] until end of the month and move back to [place] afterwards. Pt has not been seen by anyone here for HIV.     ED:  IV vanc, I&D     Review of Systems     Negative except per above.     Medical History     Past Medical History:   Diagnosis Date   . HCV infection    . HIV disease (CMS-HCC)    . Paranoid schizophrenia (CMS-HCC)      No past surgical history on file.    Social History     Social History   Substance Use Topics   . Smoking status: Current Every Day Smoker     Packs/day: 1.50   . Smokeless tobacco: Never Used   . Alcohol use No     History   Drug Use   . Yes   . Special: IV, Methamphetamines, Marijuana     Patient reports that he has been smoking.  He has been smoking about 1.50 packs per day. He has never used smokeless tobacco. He reports that he uses illicit drugs, including IV, Methamphetamines, and Marijuana. He reports that he does not drink alcohol.      Family History   No family history on file.    Immunization History   Administered Date(s) Administered   . Hep-A Vaccine, Adult Dose 06/06/2016     Medications     Triumeq  Seroquel 680m QHS  Sertraline 575mQHS    Allergies   No Known Allergies    Physical Exam   Temperature:  [98.2 F (36.8 C)] 98.2 F (36.8 C) (11/10 1059)  Blood pressure (BP): (118-131)/(73-88) 131/88 (11/10 1555)  Heart Rate:  [99] 99 (11/10 1555)  Respirations:  [16-17] 16 (11/10 1555)  Pain Score: Patient Sleeping, Respiratory Assessment Done (11/10 1555)  O2 Device: None (Room air) (11/10 1059)  SpO2:  [100 %] 100 % (11/10 1555)    Wt Readings from Last 3 Encounters:   06/10/16 63 kg (139 lb)     General: NAD, AOx3, cachectic  HEENT: NCAT, OP clear without any lesions, no oral Candidiasis   Neck: Supple  CV: RRR, S1/S2 normal, no murmurs/r/g  Lungs: Mild bronchial breath sounds on  expiratory phase of exam diffusely, no crackles/rhonchi, no increased WOB  Abdomen: Soft, non-distended, nontender, no HSM appreciated  Extremities: bil LE WWP no edema, RUE above the elbow area of erythema/warmth/edema, no evidence of elbow involvement and good active ROM of elbow, neurovascularly intact distally in hand  Skin: No jaundice, no spider angiomata, few scabbing healing lesions on bil LE  Neuro: oral  tardive dyskinesia, otherwise non-focal  Psych: Normal mood and affect  GU: normal appearing male/male, no lesions, no discharge, mass, swelling, or tenderness  ?  Data Review   Labs:  Recent Labs      06/10/16   1251   WBC  8.8   HGB  10.9*   HCT  33.1*   PLT  371*   NA  131*   K  4.2   CL  94*   BICARB  25   BUN  10   CREAT  0.69   GLU  56*   [place]  9.1   TBILI  0.52   ALK  76   AST  35   ALT  22   ALB  3.4*     Micro:  BCx pending - didn't get BCx drawn prior to Abx    Radiology/Imaging:  No results found.    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  ?  #RUE SSTI with Abscess in the setting of IVDA  SSTI with abscess on presentation s/p I&D by ED - very purulent per ED. Unfortunately no BCx prior to Abx. Neurovascularly intact. Low suspicion for nec fasc today. Low suspicion for joint involvement.   - admit to M/S   - IV vanc  - BCx ordered, CRP added on  - pending cx from wound drainage    - ultrasound to evaluate for remaining abscess, elbow joint involvement (low suspicion), and DVT    #HIV  Patient appeared to be a reliable historian and reports being on Triumeq with multiple missed dose. Reports remote hx of OIs in the past. Reports hx of STIs including Syphilis but not sexually active. [place] care  -  HIV VL, T-cell subset  - CXR PA/Lat given physical exam findings, cachexia, and reported hx of TB - no cough or any respiratory symptoms  - syphilis RPR titer   - pending Owen recs  - HOLDING ARV given reported noncompliance    #HCV   Treatment naiive. No stigmata of cirrhosis on exam. LFTs wnl. Pltlts high.   - VL  - GT  - outpt f/u    #LUTS  Some degree of BPH symptoms but also reports dysuria  - UA  - start tamsulosin      #IVDA   No murmurs on exam or other stigmata of endocarditis. No fever/leukocytosis. Follow-up on BCx.  - UTox   - SW consult    #Paranoid Schizophrenia  On Seroquel at home but signs of tardive dyskinesia on exam.   - home Sertraline 26m QHS  - hold off on seroquel for now - will consult psych for reccs    #Normocytic Anemia  Prior baseline ~11 in 11/2015 per Care Everywhere. No bleeding. Likely chronic.  - monitor    FEN:        regular diet  Thromboprophylaxis: pharmacologic:  Lovenox  Glucose Management: Monitor Q6H - pt hypoglycemic on ED presentation - inpt hypoglycemic protocol placed, has diet   Stool:   treat for constipation  Indwelling lines: PIV  De-escalation of ABX: pending clinical improvement    #Code Status: DNR/DNI  Patient has capacity today and clearly stated his wishes to be do not intubate/do not resuscitate.    Patient seen and discussed with attending physician Dr. CMarquis Buggy     JMarcy Panning MD  Internal Medicine PGY3  [phone]     INTERNAL MEDICINE ATTENDING ADDENDUM  I have seen and examined the patient. I reviewed the above documentation by housestaff and agree. The resident and I developed the assessment and plan together, and I agree with the plan of care as outlined above with any exceptions underlined and in italics.     CWindy Fast [facility]  Pager [phone]

## 2016-06-10 NOTE — ED Notes (Signed)
Pt brought to utrasound by ultrasound tech. ED will then be called to transport patient to the floor.

## 2016-06-10 NOTE — ED MD Progress Note (Signed)
Workup Review   sign out note  HIV+ patient  Meth user IV   Here with abscess over upper arm with extensive cellulitis.   Area aware from joint  Admit for IV abx for cellulitis  Wound culture of abscess

## 2016-06-10 NOTE — ED Floor Report (Signed)
ED to IP Handoff    Report created by Rickard PatienceJeanne Shiro Ellerman, RN at 7:01 PM 06/10/2016.     HANDOFF REPORT UPDATE/CHANGES (changes in patient status/care/events prior to transfer)  By ZOX:WRUEAVwho:Lawrence Sanders  Time: 1901  Additional information:                                                                                                                                                     Lawrence RiseKent Sanders is a 55 year old male.    Brief Summary of ED Visit (to include focused assessment and neuro status):  Pt presents to ED with cc of what he says is a "spider bite" to his RUA.  Large abscess noted.  Pt admits to using IV meth yesterday, also states he reuses needles. Pt aao.  Hx of HIV+, schizoaffective disorder.    RN shift assessment exceptions to WDL: pt has been aao, calm,cooperative during entire ED stay. MD did I/D with large amount of purulent drainage from RUA abscess.  Blood cultures x 2 drawn and sent.      Any significant events and interventions with responses:  n/a    Radiologic studies not completed: n/a  (None unless otherwise noted)    Chief Complaint   Patient presents with   . Abscess - Simple     large non draining abcess right ac area       Admitted for: abscess / observation    Code Status:  Please refer to In-pt admitting doctors orders     Level of Care: med-surg     Is patient septic? no If yes, complete below:    BC x 2 drawn? yes  If No explain:  n/a    Repeat lactate needed? no  If Yes, when is it due?  n/a    All initial antibiotics given?  yes  If No, explain:  n/a    Amount of IV fluids received 1000 ml    Is patient on Heparin? no If yes, complete below:     Time Heparin bolus was given: n/a    Additional drips patient is on: n/a    Cardiac rhythm: nsr    Oxygen Delivery: None    Past Medical History:   Diagnosis Date   . HCV infection    . HIV disease (CMS-HCC)    . Paranoid schizophrenia (CMS-HCC)        No past surgical history on file.    Allergies: Review of patient's allergies indicates no  known allergies.    ED Fall Risk:      Skin issues:  no    >> If yes, note areas of skin breakdown. See appropriate photos.      Ambulatory:  yes    Sitter needed: no    Suicide Risk:  no    Isolation Required: no     >>  If yes , what type of isolation: n/a    Is patient in custody?  no    Is patient in restraints? no    Vitals:    06/10/16 1059 06/10/16 1555 06/10/16 1849   BP: 118/73 131/88 103/63   Pulse: 99 99 99   Resp: 17 16 16    Temp: 98.2 F (36.8 C)     SpO2: 100% 100% 98%   Weight: 63 kg (139 lb)     Height: 6\' 2"  (1.88 m)              Lab Results   Component Value Date    WBC 8.8 06/10/2016    RBC 4.13 (L) 06/10/2016    HGB 10.9 (L) 06/10/2016    HCT 33.1 (L) 06/10/2016    MCV 80.1 06/10/2016    MCHC 32.9 06/10/2016    RDW 15.1 (H) 06/10/2016    PLT 371 (H) 06/10/2016    MPV 9.5 06/10/2016       Lab Results   Component Value Date    NA 131 (L) 06/10/2016    K 4.2 06/10/2016    CL 94 (L) 06/10/2016    BICARB 25 06/10/2016    BUN 10 06/10/2016    CREAT 0.69 06/10/2016    GLU 56 (LL) 06/10/2016    [place] 9.1 06/10/2016       No results found for: BNP, PHOS, MG, LACTATE, AMMONIA, IONCA, ARTIONCA    No results found for: CPK, CKMBH, TROPONIN    No results found for: PH, PCO2, O2CONTENT, IVHC3, IVBE, O2SAT, UNPH, UNPCO2, ARTPH, ARTPCO2, ARTO2CNT, IAHC3, IABE, ARTO2SAT, UNAPH, UNAPCO2    No results found for this visit on 06/10/16.      Patient Lines/Drains/Airways Status    Active PICC Line / CVC Line / PIV Line / Drain / Airway / Intraosseous Line / Epidural Line / ART Line / Line Type / Wound     Name: Placement date: Placement time: Site: Days:    Peripheral IV - 18 G Left Antecubital 06/10/16   1319   Antecubital   less than 1                    ED Handoff Report is ready for review.  Admitting RN may reach Emergency Department RN, Rickard PatienceJeanne Sarkis Rhines, RN, at [phone] with any questions.

## 2016-06-10 NOTE — ED Notes (Signed)
Pt provided with 2 packs graham crackers, 2 apple juice, 2 jello for BS.  Pt aao, able to feed self.  md at bedside preparing for I & D.  Pt on monitor.

## 2016-06-10 NOTE — ED MD Progress Note (Signed)
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   Given HIV history and extensive cellulitis, getting admitted for IV abx, and wound culture.    Dr. Alfred LevinsFunk Following.

## 2016-06-10 NOTE — ED Procedure Note (Signed)
Procedure Note  Incision and Drainage  Date/Time: 06/10/2016 3:53 PM  Performed by: Marilynn RailFUNK, Amran Malter KATHRYN  Authorized by: Luther ParodyFERNANDEZ, JORGE A   Consent: Verbal consent obtained.  Consent given by: patient  Patient understanding: patient states understanding of the procedure being performed  Type: abscess  Body area: upper extremity  Location details: right arm  Anesthesia: local infiltration    Anesthesia:  Local Anesthetic: lidocaine 1% with epinephrine  Anesthetic total: 20 mL    Sedation:  Patient sedated: no  Risk factor: underlying major vessel  Scalpel size: 11  Incision type: single straight  Drainage: purulent  Drainage amount: copious  Wound treatment: wound packed  Patient tolerance: Patient tolerated the procedure well with no immediate complications  Comments: Abscess measuring 6x6cm, cleaned and injected with lidocaine circumferentially. 3cm incision, straight, with gushing thin malodorous purulence expressed. Abscess bed explore with curved Kelly forceps, few simple loculations marsupialized. Expressed pus from all corners with pressure. Irrigated with 500cc saline. Packed with 1inch plain gauze.

## 2016-06-10 NOTE — ED Provider Notes (Signed)
Emergency Department Note  [facility] electronic medical record reviewed for pertinent medical history.     Nursing Triage Note:   Chief Complaint   Patient presents with   . Abscess - Simple     large non draining abcess right ac area       HPI:   55 year old male with a PMH significant for HIV/HCV who p/w R arm abscess related to injecting IV meth. Patient reports his arm has been swollen for the past week. He doesn't inject himself, has others do it for him, doesn't know if he had any missed injection or skin popping in the area.     Endorses pain  Denies fevers, chills, rigors, nausea, vomiting    HPI    No past medical history on file.    No past surgical history on file.    Family History:    No family history on file.    Social History:  +tob, -EtOH, +IV meth, homeless    Social History   Substance Use Topics   . Smoking status: Not on file   . Smokeless tobacco: Not on file   . Alcohol use Not on file       Medications:   None       Allergies: Review of patient's allergies indicates no known allergies.    Review of Systems:     Review of Systems  All other systems reviewed and negative unless otherwise noted in the HPI or above. This was done per my custom and practice for systems appropriate to the chief complaint in an emergency department setting and varies depending on the quality of history that the patient is able to provide.      Physical Exam:   06/10/16  1059   BP: 118/73   Pulse: 99   Resp: 17   Temp: 98.2 F (36.8 C)   SpO2: 100%     Nursing note and vitals reviewed.     Physical Exam   Nursing note and vitals reviewed. Pt not hypoxic.  Constitutional: The patient is oriented to person, place, and time and appears well-developed and well-nourished. Cachectic male obvious pain.   HEENT: Normal extra-occular movements in R eye. L eye with prior exenteration, wearing eye patch. No conjunctival injection or scleral icterus. Oropharynx clear, no thrush. Neck supple  Cardiovascular: Regular rhythm.   Tachycardic. No extra heart sounds, murmurs, clicks.  Pulmonary/Chest: Effort normal and breath sounds normal. No respiratory distress. No wheezes, rhonchi, or rales . Abdominal: Soft. No distension and no masses. There is no tenderness.   Extremities: moving all extremities w/o issue.  R arm with large abscess measuring 6x6cm with induration and erythema and skin dimpling extending from mid forearm to mid bicep, exquisitely tender throughout.   Neuro: No evidence of facial droop, normal speech, mentation appropriate, steady gait.   Psychiatric: Normal affect. Mood not labile nor depressed.   Skin: No rashes, lesions, or wounds.       Workup Review:  Workup Review         Impression & Initial ED Plan:  55 year old  male presents with R arm abscess  Exam c/w large abscess with extension to elbow joint  U/S showing fluid pocket with surrounding debris and minimal cobblestoning  Will XR and perform I & D at bedside - see procedure note  Patient with history of HIV/HCV not on ARVs - vanc started   Hypoglycemic to 56 while in ED, given PO juice/crackers/jello and  POC glucose responded to 167      Will admit to medicine for admission for IV antibiotic therapy and observation with further work up of joint involvement      The rest of the ED course, results, and plan for the patient is in a separate continuation note. Please see that note for details.       I have discussed my evaluation and care plan for the patient with the attending physician Dr. Lily PeerFernandez.     Marilynn RailFunk,  Kathryn, MD  Resident  06/10/16 2119       Mickel FuchsFernandez, Jorge A, MD  06/16/16 [phone]

## 2016-06-10 NOTE — ED Notes (Signed)
Called report to [place], rn on [facility].  Transport pending

## 2016-06-11 ENCOUNTER — Observation Stay (HOSPITAL_COMMUNITY): Payer: MEDICAID

## 2016-06-11 DIAGNOSIS — B20 Human immunodeficiency virus [HIV] disease: Secondary | ICD-10-CM

## 2016-06-11 DIAGNOSIS — R600 Localized edema: Secondary | ICD-10-CM

## 2016-06-11 DIAGNOSIS — R9431 Abnormal electrocardiogram [ECG] [EKG]: Secondary | ICD-10-CM

## 2016-06-11 DIAGNOSIS — L859 Epidermal thickening, unspecified: Secondary | ICD-10-CM

## 2016-06-11 DIAGNOSIS — F200 Paranoid schizophrenia: Secondary | ICD-10-CM

## 2016-06-11 DIAGNOSIS — I2129 ST elevation (STEMI) myocardial infarction involving other sites: Secondary | ICD-10-CM

## 2016-06-11 DIAGNOSIS — R000 Tachycardia, unspecified: Secondary | ICD-10-CM

## 2016-06-11 DIAGNOSIS — F1510 Other stimulant abuse, uncomplicated: Secondary | ICD-10-CM

## 2016-06-11 DIAGNOSIS — B1920 Unspecified viral hepatitis C without hepatic coma: Secondary | ICD-10-CM

## 2016-06-11 DIAGNOSIS — F1990 Other psychoactive substance use, unspecified, uncomplicated: Secondary | ICD-10-CM

## 2016-06-11 LAB — BASIC METABOLIC PANEL, BLOOD
Anion Gap: 10 mmol/L (ref 7–15)
Anion Gap: 13 mmol/L (ref 7–15)
BUN: 12 mg/dL (ref 6–20)
BUN: 18 mg/dL (ref 6–20)
Bicarbonate: 23 mmol/L (ref 22–29)
Bicarbonate: 23 mmol/L (ref 22–29)
Calcium: 8.7 mg/dL (ref 8.5–10.6)
Calcium: 8.7 mg/dL (ref 8.5–10.6)
Chloride: 98 mmol/L (ref 98–107)
Chloride: 93 mmol/L — ABNORMAL LOW (ref 98–107)
Creatinine: 0.63 mg/dL — ABNORMAL LOW (ref 0.67–1.17)
Creatinine: 0.8 mg/dL (ref 0.67–1.17)
GFR: >60 mL/min
GFR: >60 mL/min
Glucose: 87 mg/dL (ref 70–99)
Glucose: 152 mg/dL — ABNORMAL HIGH (ref 70–99)
Potassium: 4.6 mmol/L (ref 3.5–5.1)
Potassium: 4.6 mmol/L (ref 3.5–5.1)
Sodium: 131 mmol/L — ABNORMAL LOW (ref 136–145)
Sodium: 129 mmol/L — ABNORMAL LOW (ref 136–145)

## 2016-06-11 LAB — CBC WITH DIFF, BLOOD
ANC-Automated: 3.4 10*3/uL (ref 1.6–7.0)
Abs Eosinophils: 0.1 10*3/uL (ref 0.1–0.5)
Abs Lymphs: 2.3 10*3/uL (ref 0.8–3.1)
Abs Monos: 0.5 10*3/uL (ref 0.2–0.8)
Eosinophils: 2 %
Hct: 35.2 % — ABNORMAL LOW (ref 40.0–50.0)
Hgb: 11.5 gm/dL — ABNORMAL LOW (ref 13.7–17.5)
Lymphocytes: 37 %
MCH: 26.5 pg (ref 26.0–32.0)
MCHC: 32.7 g/dL (ref 32.0–36.0)
MCV: 81.1 um3 (ref 79.0–95.0)
MPV: 9.2 fL — ABNORMAL LOW (ref 9.4–12.4)
Monocytes: 8 %
Plt Count: 373 10*3/uL — ABNORMAL HIGH (ref 140–370)
RBC: 4.34 10*6/uL — ABNORMAL LOW (ref 4.60–6.10)
RDW: 15.2 % — ABNORMAL HIGH (ref 12.0–14.0)
Segs: 54 %
WBC: 6.2 10*3/uL (ref 4.0–10.0)

## 2016-06-11 LAB — PROTHROMBIN TIME, BLOOD
INR: 1.2
PT,Patient: 13.4 s — ABNORMAL HIGH (ref 9.7–12.5)

## 2016-06-11 LAB — PHOSPHORUS, BLOOD: Phosphorous: 4.1 mg/dL (ref 2.7–4.5)

## 2016-06-11 LAB — HEMOGRAM, BLOOD
Hct: 31.4 % — ABNORMAL LOW (ref 40.0–50.0)
Hgb: 10.2 gm/dL — ABNORMAL LOW (ref 13.7–17.5)
MCH: 26.3 pg (ref 26.0–32.0)
MCHC: 32.5 g/dL (ref 32.0–36.0)
MCV: 80.9 um3 (ref 79.0–95.0)
MPV: 9.3 fL — ABNORMAL LOW (ref 9.4–12.4)
Plt Count: 355 10*3/uL (ref 140–370)
RBC: 3.88 10*6/uL — ABNORMAL LOW (ref 4.60–6.10)
RDW: 15.3 % — ABNORMAL HIGH (ref 12.0–14.0)
WBC: 5.9 10*3/uL (ref 4.0–10.0)

## 2016-06-11 LAB — GLUCOSE (POCT)
Glucose (POCT): 86 mg/dL (ref 70–99)
Glucose (POCT): 147 mg/dL — ABNORMAL HIGH (ref 70–99)

## 2016-06-11 LAB — CPK-CREATINE PHOSPHOKINASE, BLOOD: CPK: 110 U/L (ref 0–175)

## 2016-06-11 LAB — MAGNESIUM, BLOOD: Magnesium: 1.9 mg/dL (ref 1.6–2.6)

## 2016-06-11 LAB — CKMB+INDEX (NO CPK), BLOOD
CK-MB Index: 2.7 %
CK-MB: 3 ng/mL (ref 0.0–4.8)

## 2016-06-11 LAB — LACTATE, BLOOD: Lactate: 1.7 mmol/L (ref 0.5–2.0)

## 2016-06-11 LAB — C-REACTIVE PROTEIN, BLOOD: CRP: 14.2 mg/dL — ABNORMAL HIGH (ref <0.5)

## 2016-06-11 LAB — TROPONIN T, BLOOD: Troponin T: <0.01 ng/mL (ref <0.01)

## 2016-06-11 LAB — APTT, BLOOD: PTT: 22.9 s — ABNORMAL LOW (ref 25.0–34.0)

## 2016-06-11 MED ORDER — QUETIAPINE FUMARATE 300 MG OR TABS
600.0000 mg | ORAL_TABLET | Freq: Every evening | ORAL | Status: DC
Start: 2016-06-11 — End: 2016-06-12
  Administered 2016-06-11: 600 mg via ORAL
  Filled 2016-06-11: qty 2

## 2016-06-11 MED ORDER — SODIUM CHLORIDE 0.9 % IV SOLN
Freq: Once | INTRAVENOUS | Status: AC
Start: 2016-06-11 — End: 2016-06-12
  Administered 2016-06-11: 23:00:00 via INTRAVENOUS

## 2016-06-11 MED ORDER — SODIUM CHLORIDE 0.9 % IV BOLUS
1000.0000 mL | INJECTION | Freq: Once | INTRAVENOUS | Status: AC
Start: 2016-06-11 — End: 2016-06-11
  Administered 2016-06-11: 1000 mL via INTRAVENOUS

## 2016-06-11 MED ORDER — ABACAVIR-DOLUTEGRAVIR-LAMIVUD 600-50-300 MG PO TABS
1.0000 | ORAL_TABLET | Freq: Every day | ORAL | Status: DC
Start: 2016-06-11 — End: 2016-06-15
  Administered 2016-06-11 – 2016-06-15 (×5): 1 via ORAL
  Filled 2016-06-11 (×6): qty 1

## 2016-06-11 MED ORDER — POLYETHYLENE GLYCOL 3350 OR PACK
17.0000 g | PACK | Freq: Every day | ORAL | Status: DC
Start: 2016-06-11 — End: 2016-06-13
  Administered 2016-06-11 – 2016-06-12 (×2): 17 g via ORAL
  Filled 2016-06-11 (×2): qty 1

## 2016-06-11 NOTE — Plan of Care (Signed)
Problem: Infection  Goal: Absence of infection signs and symptoms  Outcome: Progressing toward goal, anticipate improvement over: next 12-24 hours  Patient remains afebrile this shift. RN reassesses for new s/s infection throughout shift.   Goal: Perform proper infection control measures  Outcome: Goal Met  Infection control practiced by RN through proper hand hygiene when entering and leaving the room, standard precautions utilized.     Problem: Pain - Acute  Goal: Communication of presence of pain  Outcome: Progressing toward goal, anticipate improvement over: next 12-24 hours  Patient demonstrates ability to report his pain to RN using numeric pain scale. Patient does not have complaints of pain this shift. Reassessed throughout day.

## 2016-06-11 NOTE — Plan of Care (Signed)
Problem: Discharge Planning  Goal: Participation in care planning  Outcome: Progressing toward goal, anticipate improvement over: Next 24-48 hours  Pt got admitted with rt arm abscess,just wanted to eat and sleep as he was tired and hungry,will need encouragement with involvement in care,will coordinate plans with team and keep pt involved in the same.    Problem: Infection  Goal: Absence of infection signs and symptoms  Outcome: Progressing toward goal, anticipate improvement over: Next 24-48 hours  Pt was admitted with rt arm abscess,afebrile on admission,drsg to rt arm  C/D/I,continues on iv antibiotics,wbc;8.8,encouraged to keep drsg clean,encouraged good handwashing,will continue to monitor for infection.    Problem: Pain - Acute  Goal: Communication of presence of pain  Outcome: Progressing toward goal, anticipate improvement over: >48 hours  Pt got admitted with rt arm abscess,drsg C/D/I,on iv antibiotics,no c/o pain,will continue to monitor and medicate as needed for pain.
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
## 2016-06-11 NOTE — Progress Notes (Signed)
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    Subjective:     Patient reports feeling well this AM. Right arm pain significantly improved since admission - good sensation, able to bend elbow. No diarrhea, rather constipated. Tolerating diet well.    Physical Exam   Temperature:  [97.8 F (36.6 C)-98.5 F (36.9 C)] 98 F (36.7 C) (11/11 0751)  Blood pressure (BP): (100-132)/(61-88) 128/83 (11/11 0751)  Heart Rate:  [80-99] 80 (11/11 0751)  Respirations:  [16-18] 18 (11/11 0751)  Pain Score: 0 (11/11 0751)  O2 Device: None (Room air) (11/11 0352)  SpO2:  [97 %-100 %] 100 % (11/11 0751)    Wt Readings from Last 3 Encounters:   06/10/16 57.5 kg (126 lb 12.2 oz)     General: NAD, AOx3, cachectic  HEENT: NCAT  CV: RRR, S1/S2 normal, no murmurs/r/g  Lungs: no increased WOB, CTA bilaterally   Abdomen: Soft, non-distended, nontender, no HSM appreciated  Extremities: bil LE WWP no edema  RUE proximal to elbow s/p I&D with packed wound, surround edema/warmth improved comparing to yesterday, no sig tenderness, at elbow some edema without sig tenderness full active ROM without pain, distal to elbow still edema and warmth without fluctuance today, nontender to palpation   Skin: No jaundice  Neuro: oral tardive dyskinesia, otherwise non-focal  Psych: Normal mood and affect  ?  Data Review   Labs:  Recent Labs      06/10/16   1251  06/11/16   0625   WBC  8.8  6.2   HGB  10.9*  11.5*   HCT  33.1*  35.2*   PLT  371*  373*   NA  131*  131*   K  4.2  4.6   CL  94*  98   BICARB  25  23   BUN  10  12   CREAT  0.69  0.63*   GLU  56*  87   [place]  9.1  8.7   TBILI  0.52   --    ALK  76   --    AST  35   --    ALT  22   --    ALB  3.4*   --    CRP  16.30*  14.20*     Micro:  BCx drawn after Abx -  pending  Wound - G/S   Heavy polymorphonucleated white blood cells   Heavy red blood cells   Heavy Gram positive cocci   Few Gram negative bacilli     Radiology/Imaging:  06/2016 ultrasound  FINDINGS:  Duplex Doppler imaging was performed with visualization of the internal jugular, subclavian, and axillary veins on the right and the left subclavian vein. There is no evidence of intraluminal thrombus. Normal spectral waveforms are noted with respiratory variation. Color flow is normal.  In addition, targeted ultrasound was performed along the right anterior upper arm, 3 cm superior to the elbow. In this region, the muscle and subcutaneous tissues are edematous. Linear collection of gas is noted consistent with recent debridement. No fluid collection or hematoma seen.  IMPRESSION:  No evidence of thrombus within the examined deep veins in the examined veins.  Significant edema noted along  the right anterior upper arm. No evidence of abscess. Air is consistent with recent debridement. If there is continued concern for additional abscess collections in the arm, please consider CT with contrast for more sensitive evaluation.    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  ?  #RUE SSTI with Abscess in the setting of IVDA  Clinically proximal arm improving, CRP improving. Elbow ROM intact without pain, low suspicion for joint involvement. Distal to elbow, still edema but without fluctuance. Neurovascularly intact. Low suspicion for nec fasc.   - continue IV vanc  - pending cx  - discussed with radiology regarding ultrasound, only evaluated arm proximal to elbow for DVT. Per radiology, distal to elbow superficial veins are not routinely evaluated given no deeps  veins. Will monitor clinically and if worsening distally will order ultrasound for distally.     #HIV  ORoxy Mannson board, appreciate reccs  - resume Triumeq per ORoxy Manns - HIV VL, T-cell subset  - syphilis RPR titer     #HCV   Treatment naiive. No stigmata of cirrhosis on exam. LFTs wnl. Pltlts high.   - VL  - GT  - Hep B/A panel  - outpt f/u    #LUTS  UA neg  - tamsulosin      #IVDA   No murmurs on exam or other stigmata of endocarditis. No fever/leukocytosis. Follow-up on BCx.  - UTox   - SW consult    #Paranoid Schizophrenia  On Seroquel at home but signs of tardive dyskinesia on exam.   - home Sertraline [id number]  - tardive dyskinesia ok if patient tolerating, resume Seroquel [id number]     #Normocytic Anemia  Hgb at baseline    FEN: Diet Type: Regular      regular diet  Thromboprophylaxis: pharmacologic:  Lovenox  Glucose Management: can stop POC fingersticks, euglycemic  Stool: Miralax for constipation, no diarrhea  Indwelling lines: PIV  De-escalation of ABX: pending clinical improvement  #Code Status: DNR/DNI    Patient seen and discussed with attending physician Dr. ChaMarquis Buggy   JiaMarcy PanningD  Internal Medicine PGY3  [phone]   INTERNAL MEDICINE ATTENDING ADDENDUM  I have seen and examined the patient. I reviewed the above documentation by housestaff and agree. The resident and I developed the assessment and plan together, and I agree with the plan of care as outlined above with any exceptions underlined and in italics.     ConWindy FastD [facility]  Pager [phone]

## 2016-06-11 NOTE — Interdisciplinary (Signed)
I responded to the rapid response that was paged on this patient. Patient was awake, spontaneously breathing on his own, maintaining sats on room air.

## 2016-06-11 NOTE — Interdisciplinary (Signed)
S/b on call.

## 2016-06-11 NOTE — Interdisciplinary (Addendum)
Pt called,stating that he fell weird after seroquel was given,pt is alert,responding,but drowsy-see vitals flowsheet for BP/HR.Put pt in trendelenburgs position,paged RRT.s/b RRT,RT.IV fluid bolus of ns 1000 ml started,stat labs drawn,Stat EKG done.paged on call.Pt moving all 4 extremities,responding to commands.

## 2016-06-11 NOTE — Interdisciplinary (Signed)
Transferred pt to # 3 on 9th floor.report given to Darryl.All pts belongings sent with him on transfer-black duffel bag/clothes

## 2016-06-11 NOTE — Interdisciplinary (Signed)
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 A+O x 3. MAE. ST 116, 97/46 (58) , RR 18, O2 sat 98 on RA. Denies pain  R: BS WNL, Lab sent ( cbc, chem, ptt/ inr, lactate, troponin ) EKG x 2 done. New IV started. NS 1000ml x 2 given. Pt's BP had positive respond to fluid bolus.  Pt woke up asking for urinal and voided 625ml prior to transfering to IMU.

## 2016-06-11 NOTE — Interdisciplinary (Signed)
2nd EKG done

## 2016-06-11 NOTE — Interdisciplinary (Signed)
Pt is more responsive,voided 625 ml clear yellow urine.Pt for transfer to 7th floor for close observation.

## 2016-06-12 DIAGNOSIS — D649 Anemia, unspecified: Secondary | ICD-10-CM

## 2016-06-12 DIAGNOSIS — N401 Enlarged prostate with lower urinary tract symptoms: Secondary | ICD-10-CM

## 2016-06-12 DIAGNOSIS — L02413 Cutaneous abscess of right upper limb: Principal | ICD-10-CM

## 2016-06-12 LAB — VANCOMYCIN, RANDOM: Vancomycin, Random: 12.8 ug/mL

## 2016-06-12 LAB — CBC WITH DIFF, BLOOD
ANC-Automated: 1.7 10*3/uL (ref 1.6–7.0)
Abs Eosinophils: 0.1 10*3/uL (ref 0.1–0.5)
Abs Lymphs: 2.2 10*3/uL (ref 0.8–3.1)
Abs Monos: 0.5 10*3/uL (ref 0.2–0.8)
Eosinophils: 2 %
Hct: 33 % — ABNORMAL LOW (ref 40.0–50.0)
Hgb: 10.5 gm/dL — ABNORMAL LOW (ref 13.7–17.5)
Lymphocytes: 49 %
MCH: 25.9 pg — ABNORMAL LOW (ref 26.0–32.0)
MCHC: 31.8 g/dL — ABNORMAL LOW (ref 32.0–36.0)
MCV: 81.5 um3 (ref 79.0–95.0)
MPV: 9.2 fL — ABNORMAL LOW (ref 9.4–12.4)
Monocytes: 10 %
Plt Count: 378 10*3/uL — ABNORMAL HIGH (ref 140–370)
RBC: 4.05 10*6/uL — ABNORMAL LOW (ref 4.60–6.10)
RDW: 15.3 % — ABNORMAL HIGH (ref 12.0–14.0)
Segs: 38 %
WBC: 4.5 10*3/uL (ref 4.0–10.0)

## 2016-06-12 LAB — TROPONIN T, BLOOD
Troponin T: <0.01 ng/mL (ref <0.01)
Troponin T: <0.01 ng/mL (ref <0.01)

## 2016-06-12 LAB — BASIC METABOLIC PANEL, BLOOD
Anion Gap: 10 mmol/L (ref 7–15)
BUN: 13 mg/dL (ref 6–20)
Bicarbonate: 22 mmol/L (ref 22–29)
Calcium: 8.6 mg/dL (ref 8.5–10.6)
Chloride: 104 mmol/L (ref 98–107)
Creatinine: 0.59 mg/dL — ABNORMAL LOW (ref 0.67–1.17)
GFR: >60 mL/min
Glucose: 107 mg/dL — ABNORMAL HIGH (ref 70–99)
Potassium: 4.4 mmol/L (ref 3.5–5.1)
Sodium: 136 mmol/L (ref 136–145)

## 2016-06-12 LAB — MRSA SURVEILLANCE CULTURE

## 2016-06-12 LAB — CPK-CREATINE PHOSPHOKINASE, BLOOD
CPK: 105 U/L (ref 0–175)
CPK: 85 U/L (ref 0–175)

## 2016-06-12 LAB — CKMB+INDEX (NO CPK), BLOOD
CK-MB Index: 3.8 %
CK-MB Index: 3.5 %
CK-MB: 4 ng/mL (ref 0.0–4.8)
CK-MB: 3 ng/mL (ref 0.0–4.8)

## 2016-06-12 LAB — C-REACTIVE PROTEIN, BLOOD: CRP: 7.3 mg/dL — ABNORMAL HIGH (ref <0.5)

## 2016-06-12 MED ORDER — OXYCODONE HCL 5 MG OR TABS
5.0000 mg | ORAL_TABLET | ORAL | Status: DC | PRN
Start: 2016-06-12 — End: 2016-06-15
  Administered 2016-06-14: 5 mg via ORAL
  Filled 2016-06-12: qty 1

## 2016-06-12 MED ORDER — QUETIAPINE FUMARATE 200 MG OR TABS
300.0000 mg | ORAL_TABLET | Freq: Every evening | ORAL | Status: DC
Start: 2016-06-12 — End: 2016-06-14
  Administered 2016-06-12 – 2016-06-13 (×2): 300 mg via ORAL
  Filled 2016-06-12 (×2): qty 1

## 2016-06-12 MED ORDER — OXYCODONE HCL 10 MG OR TABS
10.0000 mg | ORAL_TABLET | ORAL | Status: DC | PRN
Start: 2016-06-12 — End: 2016-06-15
  Administered 2016-06-12 – 2016-06-15 (×5): 10 mg via ORAL
  Filled 2016-06-12 (×5): qty 1

## 2016-06-12 MED ORDER — MORPHINE SULFATE 2 MG/ML IJ SOLN
2.0000 mg | INTRAMUSCULAR | Status: DC | PRN
Start: 2016-06-12 — End: 2016-06-15

## 2016-06-12 MED ORDER — SENNA 8.6 MG OR TABS
2.0000 | ORAL_TABLET | Freq: Every morning | ORAL | Status: DC
Start: 2016-06-12 — End: 2016-06-14
  Administered 2016-06-12 – 2016-06-13 (×2): 17.2 mg via ORAL
  Filled 2016-06-12 (×2): qty 2

## 2016-06-12 NOTE — Progress Notes (Addendum)
HIV MEDICINE [facility]) INPATIENT PROGRESS NOTE      Current Hospital Stay:   0 days - Admitted on: 06/10/2016    Overnight Events:   Rapid response called for symptomatic hypotension. Mr. Nila reporting dizziness shortly after administration of Seroquel. Received 1L bolus NS x2 (2230, 2300) and transfer to ICU with subsequent normalization of BPs. No fevers, desaturation.    Subjective:    Reports feeling dizzy during hypotensive event last night. Relates episode to Seroquel which he had not taken for some time prior to last night. No fevers/chills. Feels better today. Still with right arm pain at site of abscess.    Allergies:  No Known Allergies    Antimicrobials:  Antiretrovirals  None  Previously on Triumeq    Prophylaxis  None    Antibiotics  IV Vancomycin      Objective:  Vital Signs:  Temperature:  [97.1 F (36.2 C)-98.6 F (37 C)] 97.7 F (36.5 C) (11/12 0400)  Blood pressure (BP): (65-134)/(29-88) 114/76 (11/12 0600)  Heart Rate:  [88-118] 90 (11/12 0600)  Respirations:  [16-20] 16 (11/12 0600)  Pain Score: Patient Sleeping, Respiratory Assessment Done (11/12 0600)  O2 Device: None (Room air) (11/12 0600)  SpO2:  [95 %-100 %] 99 % (11/12 0600)LASTWT(1)  Wt Readings from Last 1 Encounters:   06/10/16 57.5 kg (126 lb 12.2 oz)       Physical Exam:  GEN: Ill appearing, thin African American gentleman, NAD  HEENT: NC/AT, EOMI, MMM, OP clear  NECK: Supple  LUNGS: Symmetric expansion, CTAB without crackles or wheezes  HEART: RRR, no murmurs  ABD: Soft, NT, ND, +BS, no HSM  EXT: Well-perfused, no edema; some serous drainage through right arm bandage  SKIN: No rashes or lesions  NEURO: A+Ox3, face symmetric, moves all extremities  PSYCH: Mood appropriate, linear thought process    Laboratory data:  All labs reviewed in EPIC and are notable for:  CBC  Lab Results   Component Value Date    WBC 4.5 06/12/2016    RBC 4.05 06/12/2016    HGB 10.5 06/12/2016    HCT 33.0 06/12/2016    MCV 81.5 06/12/2016    MCHC 31.8  06/12/2016    RDW 15.3 06/12/2016    PLT 378 06/12/2016    MPV 9.2 06/12/2016    SEG 38 06/12/2016    LYMPHS 49 06/12/2016    MONOS 10 06/12/2016    EOS 2 06/12/2016       CHEMISTRY  Lab Results   Component Value Date    NA 136 06/12/2016    K 4.4 06/12/2016    CL 104 06/12/2016    BICARB 22 06/12/2016    BUN 13 06/12/2016    CREAT 0.59 06/12/2016    GLU 107 06/12/2016    [place] 8.6 06/12/2016       Microbiology: All micro reviewed in EPIC and includes:  BLOOD  11/10- NGTD  11/11- NGTD  BODY SITE  11/10- Heavy PMN, heavy RBC, heavy GPC, few GN bacilli; culture with Strep constellatus     MRSA surveillance 11/10:  negative    Radiology: Images viewed by me an are notable for:  X-ray elbow (11/10):  IMPRESSION:  Focal medial soft tissue swelling at the level of the distal humeral metaphysis that could be compatible with provided history of abscess, without radiographic evidence of underlying osteomyelitis, soft tissue gas, or radiopaque foreign body.    Chest x-ray (11/10):  IMPRESSION:  1. No acute cardiopulmonary disease.  US extremity (11/10):    IMPRESSION:  No evidence of thrombus within the examined deep veins in the examined veins.  Significant edema noted along the right anterior upper arm. No evidence of abscess. Air is consistent with recent debridement. If there is continued concern for additional abscess collections in the arm, please consider CT with contrast for more sensitive evaluation.    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     # HIV:  Self-reported CD4 count of 399, unknown VL as of ~2 months ago.  Patient was prescribed on Triumeq, but since he has relapsed on IVDU, he notes he has not been compliant for past two months.    --Follow up on CD4 count and viral load  --Please continue  Triumeq 1 tablet daily  --[facility]    # Right arm abscess:  2/2 IVDU.  S/p I&D on 11/10 with thin, malodorous, drainage expressed. Growing strep constellatus, with no MRSA identified on surveillance screen. In light of hypotensive event last evening, would continue IV vanc for the time being. Likely will opt for a total of 14 days abx, with the option to move to oral antibiotic eventually.    --Can continue IV Vanc for now  --Will defer transition to oral abx at this time    #Symptomatic Hypotension: Developed acutely last evening with good response to fluids and no recurrence. Workup thus far negative for additional infectious causes, and may be related to Seroquel adverse effect.  -- follow up repeat blood cx  -- continue IV vanc as above    #HCV:  Untreated  --Follow up on HepC genotype, HC VL  --Depending on VL, may need follow up in Hep C clinic    # Homelessness:  Notes having been homeless since 11/2014.  Has moved from [place] to [place] to [place] to [place] since then and hopes to move back home to [place], [place].    --Please follow up HAV IgM/ab panel, Hep B S Ag, Hep B SAb, Hep B C Ab.  If pt is not immune against Hep A, would recommend initiating vaccination while inpt, given the hepA outbreak among the homeless population       Discussed with Dr. Loreta AveWagner who is in agreement with plan above    Thank you for this very interesting consult and the opportunity to assist you in the management of this patient.    Christoper FabianNicholas Hogan  PGY-1  Internal Medicine        ATTENDING ATTESTATION    Subjective:  I reviewed the history.  Patient interviewed and examined with Dr. Mathews RobinsonsHogan.  Review of Systems (ROS): As per the resident's note.  Past Medical, Family, Social History: As per the resident's note.    Objective:   I have examined the patient and I concur with the resident's exam.  Assessment and plan reviewed with the resident. I agree with the plan as documented.    23M with HIV (not  presently on ART), HCV, paranoid schizophrenia, and IVDU (meth) presenting with R arm abscess s/p drainage. Cx pending, but he is clinically improved on vancomycin. He also presented with diarrhea but attributed this to meth and now improved. He had a brief hypotension episode without fever or white count, which was  attributed to restarting of his quetiapine. He has unstable housing and eventually [place], but for now [place] in [place]. He seems mentally stable and motivated to follow through with care. Triumeq was restarted and repeat CD4 and VL are pending.    Please see Dr. Renae GlossHogan's note for further details.    Tajha Sammarco A. Loreta AveWagner, MD  Infectious Diseases

## 2016-06-12 NOTE — Progress Notes (Signed)
Hospital Medicine Progress Note    Subjective and Interval Events  Patient feels well this morning.  Rapid response called last night for hypotension and lightheadedness; recovered with IVF.  Per patient, he always feels lightheaded if he takes 600 mg of Seroquel after not having taken any for a few days.  He doesn't get lightheaded if he starts with 300 mg for a few days before advancing to 600 mg.  No lightheadedness currently.  Denies CP, SOB, nausea, vision changes, LOC during episode.    Current Medications  . abacavir-dolutegravir-lamivudine  1 tablet Daily   . enoxaparin  40 mg Daily   . polyethylene glycol  17 g Daily   . QUEtiapine  300 mg HS   . senna  2 tablet QAM   . sertraline  50 mg QPM   . sodium chloride (PF)  3 mL Q8H   . tamsulosin  0.4 mg Daily with food   . vancomycin (VANCOCIN) IVPB  1,000 mg Q8H       Objective  Temperature:  [97.5 F (36.4 C)-98.4 F (36.9 C)] 98.4 F (36.9 C) (11/12 1930)  Blood pressure (BP): (65-133)/(29-81) 126/69 (11/12 1930)  Heart Rate:  [88-118] 98 (11/12 1930)  Respirations:  [15-20] 17 (11/12 1930)  Pain Score: 5 (11/12 1930)  O2 Device: None (Room air) (11/12 1930)  SpO2:  [95 %-100 %] 98 % (11/12 1930)     No Data Recorded     Weights (last 14 days)     Date/Time Weight Who    06/10/16 2016 57.5 kg (126 lb 12.2 oz) RM    06/10/16 1059 63 kg (139 lb) PN              11/11 0600 - 11/12 0559  In: 1117 [P.O.:617; I.V.:500]  Out: 2802 [Urine:2802]    General: Thin, well-appearing male in NAD  HEENT: Clear sclera, MMM  CV: RRR, no m/r/g  Pulm: CTABm no w/r/r  Abd: Normoactivs BS, soft, ND, NT  Ext: Right proximal arm bandaged, distal arms still swollen and warm but less so, less TTP overall, 2+ radial pulse and cap refill <2 sec  Neuro: AAOx3, sensation intact in right hand and fingers, able to move fingers    Labs and imaging reviewed:  Recent Labs      06/10/16   1251  06/11/16   0625  06/11/16   2253  06/12/16   0513   NA  131*  131*  129*  136   K  4.2  4.6  4.6   4.4   CL  94*  98  93*  104   BICARB  '25  23  23  22   '$ BUN  '10  12  18  13   '$ CREAT  0.69  0.63*  0.80  0.59*   [place]  9.1  8.7  8.7  8.6   MG   --    --   1.9   --    PHOS   --    --   4.1   --    TP  9.3*   --    --    --    ALB  3.4*   --    --    --    TBILI  0.52   --    --    --    AST  35   --    --    --    ALT  22   --    --    --    ALK  76   --    --    --        Recent Labs      06/10/16   1251  06/11/16   0625  06/11/16   2253  06/12/16   0513   WBC  8.8  6.2  5.9  4.5   HGB  10.9*  11.5*  10.2*  10.5*   HCT  33.1*  35.2*  31.4*  33.0*   MCV  80.1  81.1  80.9  81.5   PLT  371*  373*  355  378*   SEG  59  54   --   38   LYMPHS  31  37   --   49   MONOS  8  8   --   10   EOS  1  2   --   2       Recent Labs      06/11/16   2253   PTT  22.9*   INR  1.2       Micro  Blood Cx: NGTD  Wound Cx: Heavy gram positive cocci, few gram negative bacilli       Imaging  [place] Right Arm with Doppler: No evidence of thrombus within the examined deep veins in the examined veins.  Significant edema noted along the right anterior upper arm. No evidence of abscess. Air is consistent with recent debridement. If there is continued concern for additional abscess collections in the arm, please consider CT with contrast for more sensitive evaluation.    [place] Distal Right Arm Soft Tissue: Skin thickening and edema noted along the right forearm but no evidence of abscess, fluid collection, or hematoma.      Active Hospital Problems    Diagnosis   . Cellulitis [L03.90]      Resolved Hospital Problems    Diagnosis   No resolved problems to display.       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    #RUE Abscess in the setting of IVDA: Improving on vancomycin.  Cultures pending.  Elbow with full ROM and no pain. Distal arm without abscess on [place].  - Continue IV vancomycin  - F/u  cxs    #HIV: [facility] on board following.  - Continue Triumeq  - F/u HIV VL, T-cell subset  - F/u syphilis RPR titer   - Appreciate Roxy Manns recs    #HCV: Treatment naiive. No stigmata of cirrhosis on exam. LFTs wnl. Plts high.   - F/u VL, genotype  - Referral to Hep C clinic at dispo    #LUTSt: UA neg.  - Continue tamsulosin     #IVDA: No murmurs on exam or other stigmata of endocarditis. No fever/leukocytosis.   - F/u blood cxs  - SW consult    #Paranoid Schizophrenia: On Seroquel at home, but has missed many doses prior to admission.  Episode of hypotension yesterday after re-starting home 600 mg dose.  Per patient, usually starts with 300 mg for a few days then increases to full 600 mg dose.  - Decrease Seroquel to 300 mg qhs; plan to increase to 600 mg in 2 days.  - Continue home Sertraline '50mg'$  QHS    #  Normocytic Anemia: Hgb at baseline    Diet: Regular  DVT Ppx:  Lovenox      Code Status: DNAR - [place] [place] [facility]  Pager [phone]

## 2016-06-12 NOTE — Progress Notes (Signed)
Pharmacy Vancomycin Initiation Note    Inge RiseKent Valbuena, MRN: [id number]    Vancomycin indication: Skin and Soft Tissue (goal trough: 10-15 mg/L)  Creatinine: 0.59 mg/dL on 54/0911/12, Age: 55 year old, Weight: 57.5 kg, Height: 6'2"   Initial PK Parameters: Volume of distribution 40 L, Clearance  5 L/h, Half-life 5 h   Loading dose: yes 1250 mg iv x1 on 11/10     Plan: Continue 1000 mg iv q 8 hrs  for an estimated steady state trough (Css tr)= 11 mg/L. Patient clinically improving per team, WBC wnl, afebrile, cultures pending. Monitor kidney function and will assess need to sample level in 3 days or sooner if clinically indicated.      Pharmacy will continue to monitor and make adjustments as needed.     Vale HavenElika Hefazi, PharmD  PGY-1 Pharmacy Resident  Pager: [phone]

## 2016-06-12 NOTE — Plan of Care (Signed)
Problem: Pain - Acute  Goal: Communication of presence of pain  Outcome: Goal Met  Received report from rn Darryl, pt resting quietly, easily arousable w/ no noted distress or complaints, denies any pain or discomfort, remains on cardiac and sat monitor, call button w/in reach, voices understanding and plan to use same for assist.

## 2016-06-12 NOTE — Plan of Care (Signed)
Problem: Infection  Goal: Absence of infection signs and symptoms  Outcome: Goal Met  No noted s/s of infection, pt resting quietly most of day, easily arousable and able to feed self at each meal, tolerated well, reported to receiving rn Darryl.

## 2016-06-12 NOTE — Progress Notes (Addendum)
Rapid response called at 2230 due to hypotension 65/29, HR 118.  Per nursing, shortly after taking Seroquel patient paged his nurse to say that he felt "odd," and he was found to be hypotensive and tachycardic.     T 97.61F  RR 18  SpO2 96%    I examined the patient at bedside.  He was initially sleeping and snoring but easily awoken by light sternal rub.  He required constant stimulation to stay awake but was able to respond appropriately to questions and commands.   He denied chest pain or any other pain, shortness of breath, and lightheadedness.      On exam, the patient had a patch over the left eye.  Right pupil 2mm and reactive to light.    Patient admitted for right arm cellulitis. Admission Utox positive for meth.  History of schizophrenia. Patient received 50 mg sertraline at 1805 and 600 mg quetiapine at 2108 (both home medications, per admission H&P).  No other other sedating medications administered.      Somnolence and hypotension most likely due to quetiapine administration in the setting of meth withdrawal and dehydration.     Pressures improved and normalized with administration of 2L NS.      ECG performed showed some non-specific T wave abnormalities in V2 and V3 so cardiac markers were trended.  Initial troponin <0.01.  Lactate 1.7.      Blood cultures sent.     Patient was transferred to IMU for closer monitoring.  Most recent vital signs stable:  T 98.61F, HR 97, BP 123/72.

## 2016-06-13 LAB — CBC WITH DIFF, BLOOD
ANC-Automated: 1.3 10*3/uL — ABNORMAL LOW (ref 1.6–7.0)
Abs Eosinophils: 0.1 10*3/uL (ref 0.1–0.5)
Abs Lymphs: 3 10*3/uL (ref 0.8–3.1)
Abs Monos: 0.3 10*3/uL (ref 0.2–0.8)
Eosinophils: 2 %
Hct: 33.6 % — ABNORMAL LOW (ref 40.0–50.0)
Hgb: 10.7 gm/dL — ABNORMAL LOW (ref 13.7–17.5)
Lymphocytes: 64 %
MCH: 26 pg (ref 26.0–32.0)
MCHC: 31.8 g/dL — ABNORMAL LOW (ref 32.0–36.0)
MCV: 81.8 um3 (ref 79.0–95.0)
MPV: 9.1 fL — ABNORMAL LOW (ref 9.4–12.4)
Monocytes: 7 %
Plt Count: 382 10*3/uL — ABNORMAL HIGH (ref 140–370)
RBC: 4.11 10*6/uL — ABNORMAL LOW (ref 4.60–6.10)
RDW: 15.4 % — ABNORMAL HIGH (ref 12.0–14.0)
Segs: 27 %
WBC: 4.7 10*3/uL (ref 4.0–10.0)

## 2016-06-13 LAB — BASIC METABOLIC PANEL, BLOOD
Anion Gap: 9 mmol/L (ref 7–15)
BUN: 12 mg/dL (ref 6–20)
Bicarbonate: 26 mmol/L (ref 22–29)
Calcium: 9.1 mg/dL (ref 8.5–10.6)
Chloride: 98 mmol/L (ref 98–107)
Creatinine: 0.58 mg/dL — ABNORMAL LOW (ref 0.67–1.17)
GFR: >60 mL/min
Glucose: 91 mg/dL (ref 70–99)
Potassium: 4.5 mmol/L (ref 3.5–5.1)
Sodium: 133 mmol/L — ABNORMAL LOW (ref 136–145)

## 2016-06-13 LAB — HEPATITIS B CORE AB TOTAL: HBcAb Total: REACTIVE — AB

## 2016-06-13 LAB — ECG 12-LEAD
ATRIAL RATE: 109 {beats}/min
ATRIAL RATE: 103 {beats}/min
P AXIS: 85 degrees
P AXIS: 86 degrees
PR INTERVAL: 134 ms
PR INTERVAL: 132 ms
QRS INTERVAL/DURATION: 86 ms
QRS INTERVAL/DURATION: 84 ms
QT: 352 ms
QT: 360 ms
QTC INTERVAL: 474 ms
QTC INTERVAL: 471 ms
R AXIS: 76 degrees
R AXIS: 72 degrees
T AXIS: 81 degrees
T AXIS: 82 degrees
VENTRICULAR RATE: 109 {beats}/min
VENTRICULAR RATE: 103 {beats}/min

## 2016-06-13 LAB — HEP A TOTAL ANTIBODY: Hepatitis A Antibody: REACTIVE — AB

## 2016-06-13 LAB — C-REACTIVE PROTEIN, BLOOD: CRP: 3.8 mg/dL — ABNORMAL HIGH (ref <0.5)

## 2016-06-13 LAB — HIV-1 RNA QUANT/PLASMA: HIV-1 RNA Ultra Quant, Plasma: 31 copies/mL — AB

## 2016-06-13 LAB — RAPID PLASMA REAGIN QUANTITATIVE, BLOOD: RPR Quantitative: NONREACTIVE

## 2016-06-13 LAB — MAGNESIUM, BLOOD: Magnesium: 1.9 mg/dL (ref 1.6–2.6)

## 2016-06-13 LAB — HEPATITIS B SURFACE AG, BLOOD: HBsAg: NONREACTIVE

## 2016-06-13 LAB — PHOSPHORUS, BLOOD: Phosphorous: 4.2 mg/dL (ref 2.7–4.5)

## 2016-06-13 LAB — HEPATITIS B SURFACE AB, QUANT, BLOOD: HBsAb,Qt: 12.9 m[IU]/mL

## 2016-06-13 LAB — VANCOMYCIN, RANDOM: Vancomycin, Random: 14.5 ug/mL

## 2016-06-13 LAB — HEPATITIS C RNA, QUANT BLOOD
Hepatitis C RNA Quant: 1580000 [IU]/mL — AB
Hepatitis C RNA Quant: 1440000 [IU]/mL — AB

## 2016-06-13 MED ORDER — POLYETHYLENE GLYCOL 3350 OR PACK
17.0000 g | PACK | Freq: Every day | ORAL | Status: DC | PRN
Start: 2016-06-13 — End: 2016-06-15

## 2016-06-13 MED ORDER — SODIUM CHLORIDE 0.9 % IV SOLN
1000.0000 mg | INTRAVENOUS | Status: DC
Start: 2016-06-13 — End: 2016-06-14
  Administered 2016-06-13: 1000 mg via INTRAVENOUS
  Filled 2016-06-13: qty 1000
  Filled 2016-06-13: qty 50

## 2016-06-13 NOTE — Plan of Care (Signed)
Problem: Infection  Goal: Absence of infection signs and symptoms  Patient afebrile during shift. U/S of R arm shows no abcess. Redress wound with kerlex. Continue IV vanco for abx coverage.    Problem: Pain - Acute  Goal: Communication of presence of pain  Patient c/o R arm pain due to IVDA site. PO oxycodone provided as ordered. Pain education provided with patient verbalizing understanding.

## 2016-06-13 NOTE — Plan of Care (Signed)
Problem: Pain - Acute  Goal: Communication of presence of pain  Outcome: Progressing toward goal, anticipate improvement over: Next 24-48 hours  Reinforced teachings pain management, denies any pain at this time, encourage diversion and repositioning.

## 2016-06-13 NOTE — Interdisciplinary (Signed)
06/13/16 1550   Incision -  06/10/16 2112 Arm Right;Upper   Date First Assessed/Time First Assessed: 06/10/16 2112   Location: Arm  Wound Location Orientation: Right;Upper  Wound Consult Request (STAGE 3, STAGE 4, UNSTAGEABLE, DTI, UNKNOWN, and Surgical Wound): Consult Needed   Assessment Painful;Red;Open;Drainage   Peri-wound Assessment Dry;Hyperpigmented   Drainage Amount Moderate   Drainage Description Foul ;Purulent   Dressing Kerlex;Gauze  (wet to dry, packing )   Dressing Status Changed/New

## 2016-06-13 NOTE — Interdisciplinary (Signed)
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     PMH/PSH:    Past Medical History:   Diagnosis Date   . HCV infection    . HIV disease (CMS-HCC)    . Paranoid schizophrenia (CMS-HCC)      No past surgical history on file.    HT:74" WT:139lbs (63kg) IBW:190lbs (86.4kg) %IBW:73% BMI: 17.8.  Nutrition hx: Pt reports UBW of 154#, believes he last weighed this 6-7 months ago (=15lbs or 10%). Pt attributes WT loss to meth use and inadequate intake. Up until 2 weeks ago was receiving 1 meal/day from [facility] + had money to buy additional food, however reports he ran out of money 2 weeks ago, so has only been able to eat one meal/day.     Diet Rx: Regular   PO Intake: 5/7 x 1 meal o/w 100% of meals since admit. Pt reports very strong appetite, though has been getting hungry between meals. Pt accepts offer of snacks TID, also requesting boost supplements. Denies chewing difficulty.   Allergies: NKFA     GI: LBM x1 yesterday. Pt denies nausea, diarrhea, +c/o constipation. Abdomen: Soft, non-distended, nontender per MD.   Skin: Warm. Scab/abrasions to b/l legs (c/d), incision to R upper arm (small amt of serous drainage). Edema: +2 to RUE per RN head to toe assessment. RUE proximal to elbow s/p I&D with packed wound, surround edema/warmth improved, tenderness at site, at elbow mild edema without tenderness with full active ROM, distal to elbow still edema and warmth without fluctuance nontender to palpation per MD.   Nutrition-Focused Physical Exam (11/13): severe muscle loss to clavicle, acromion process.   I/O: 1940/2525. Net since admit: -1.5L.   Labs: Na 133, Cr 0.58, CRP 3.8 (trends down), K/Phos/Mg WDL.   Pertinent MEDS: Triumeq, IV abx,  senokot.     Estimated Nutrition Needs per Mifflin equation (63kg):       Nutrition Dx: Moderate malnutrition r/t social/enviornmental circumstances, chronic illness AEB <75% intake of nutr needs >3 months, 10% WT loss x 6-7 months, severe muscle loss.     I:   GOAL (11/13): Pt to meet >75% of estimated needs w/ acceptable tolerance    Plan / Recommendations:   - Continue Regular diet.   - Will send snacks and Boost Plus TID.   - Weigh pt 3x/week, via standing scale when feasible.     Discharge diet: regular    Education: Encouraged fluid intake and ambulation as able for constipation complaints. Pt aware of increased nutr needs.     M/E: RD/DTR will monitor intake/tolerance/adequacy, labs, wt, S/S of new skin concerns and will follow up per department policy.    Will f/u per policy; Recommendations relayed to care team.     Eliane DecreeJanine Nikhil Osei, MS RD pager [phone]

## 2016-06-13 NOTE — Interdisciplinary (Addendum)
0950am:Patient was sound asleep during visit.  Has a black eye patch on his left eye.    Transferred from MICU 03 to 821B.  Met inpatient criteria per interqual.  Requested an admission status order from Dr.Jian Zhang pg [phone].

## 2016-06-13 NOTE — Plan of Care (Signed)
Handoff report given to RN AGNES..Marland Kitchen

## 2016-06-13 NOTE — Plan of Care (Signed)
Attempted to give report RN not available at this time. Awaiting for charge RN to call back to get report.

## 2016-06-13 NOTE — Progress Notes (Signed)
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    Subjective:     Overnight no acute events. Tolerated '300mg'$  seroquel well.     Physical Exam   Temperature:  [97.7 F (36.5 C)-98.6 F (37 C)] 97.7 F (36.5 C) (11/13 0400)  Blood pressure (BP): (97-135)/(60-81) 124/76 (11/13 0600)  Heart Rate:  [80-102] 80 (11/13 0600)  Respirations:  [13-19] 14 (11/13 0600)  Pain Score: 0 (11/13 0600)  O2 Device: None (Room air) (11/13 0600)  SpO2:  [97 %-100 %] 99 % (11/13 0600)    Wt Readings from Last 3 Encounters:   06/13/16 60.6 kg (133 lb 9.6 oz)     General: NAD, AOx3, cachectic  CV: RRR, S1/S2 normal, no murmurs/r/g  Lungs: no increased WOB, CTA bilaterally   Abdomen: Soft, non-distended, nontender  Extremities: bil LE WWP no edema  RUE proximal to elbow s/p I&D with packed wound, surround edema/warmth improved, tenderness at site, at elbow mild edema without tenderness with full active ROM, distal to elbow still edema and warmth without fluctuance nontender to palpation   Skin: No jaundice  Neuro: oral tardive dyskinesia, otherwise non-focal  Psych: Normal mood and affect  ?  Data Review   Labs:  Recent Labs      06/10/16   1251   06/11/16   2253  06/12/16   0513  06/12/16   1450  06/13/16   0430   WBC  8.8   < >  5.9  4.5   --   4.7   HGB  10.9*   < >  10.2*  10.5*   --   10.7*   HCT  33.1*   < >  31.4*  33.0*   --   33.6*   PLT  371*   < >  355  378*   --   382*   NA  131*   < >  129*  136   --   133*   K  4.2   < >  4.6  4.4   --   4.5   CL  94*   < >  93*  104   --   98   BICARB  25   < >  23  22   --   26   BUN  10   < >  18  13   --   12   CREAT  0.69   < >  0.80  0.59*   --   0.58*   GLU  56*   < >  152*  107*   --   91   [place]  9.1   < >  8.7  8.6   --   9.1   MG   --    --    1.9   --    --   1.9   PHOS   --    --   4.1   --    --   4.2   TBILI  0.52   --    --    --    --    --    ALK  76   --    --    --    --    --  AST  35   --    --    --    --    --    ALT  22   --    --    --    --    --    ALB  3.4*   --    --    --    --    --    PT   --    --   13.4*   --    --    --    PTT   --    --   22.9*   --    --    --    INR   --    --   1.2   --    --    --    CRP  16.30*   < >   --   7.30*   --   3.80*   TROPONIN   --    < >  <0.01  <0.01  <0.01   --    LACTATE   --    --   1.7   --    --    --     < > = values in this interval not displayed.     Micro:  BCx - NGTD  Wound - G/S   Heavy polymorphonucleated white blood cells   Heavy red blood cells   Heavy Gram positive cocci   Few Gram negative bacilli   CX heavy Strep constellatus    Radiology/Imaging:  06/11/2016  IMPRESSION:  Skin thickening and edema noted along the right forearm but no evidence of abscess, fluid collection, or hematoma.    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  ?  #RUE SSTI with Abscess in the setting of IVDA  Clinically proximal arm improving, CRP improving. Elbow ROM intact without pain, low suspicion for joint involvement. Distal to elbow, still edema but without fluctuance. Neurovascularly intact. Low suspicion for nec fasc. Distal arm without abscess.  - deescalate from vanc to ceftriaxone per Owen/ID  - f/u cx/sensitivities - wound grew Strep constellatus heavy  - BCx NGTD  - wound RN consult today for reccs and for patient education    #HIV  Owen on board, appreciate reccs  - resumed Triumeq   - HIV VL, T-cell subset - not done on weekends, pending  - syphilis RPR titer     #HCV   Treatment naiive. No stigmata of cirrhosis on exam. LFTs wnl. Pltlts high.   - VL/GT  - Hep B/A panel  - outpt  f/u    #LUTS  UA neg  - tamsulosin      #IVDA   No murmurs on exam or other stigmata of endocarditis. No fever/leukocytosis. Follow-up on BCx.  - UTox   - SW consult    #Paranoid Schizophrenia  Did not tolerate '600mg'$  dose of Seroquel. Per patient, at home when he missed doses of Seroquel and suddenly resumed '600mg'$  dose, would feel lightheaded. This happened Sat evening. Pt reports that at home he self titrates Seroquel, starting from '300mg'$  QHS. Tolerated '300mg'$  dose.  - home Sertraline '50mg'$  QHS  - seroquel '300mg'$  QHS tonight, slowly  uptitrate  - downgrade to M/S    #Normocytic Anemia  Hgb at baseline    FEN: Diet Type: Regular      regular diet  Thromboprophylaxis: pharmacologic:SCD: Off Lovenox  Stool: Miralax for constipation, no diarrhea  Indwelling lines: PIV  De-escalation of ABX: vanc -> ctx today  #Code Status: DNR/DNI     Patient seen and discussed with attending physician Dr. Marquis Buggy.     Marcy Panning, MD  Internal Medicine PGY3  [phone]    INTERNAL MEDICINE ATTENDING ADDENDUM  I have seen and examined the patient. I reviewed the above documentation by housestaff and agree. The resident and I developed the assessment and plan together, and I agree with the plan of care as outlined above with any exceptions underlined and in italics.     Windy Fast, [facility]  Pager [phone]

## 2016-06-13 NOTE — Plan of Care (Signed)
Transferred to 821 via wheelchair by transporter tech. All belongings sent with patient.

## 2016-06-13 NOTE — Progress Notes (Signed)
HIV MEDICINE Lawrence Sanders) INPATIENT PROGRESS NOTE      Current Hospital Stay:   0 days - Admitted on: 06/10/2016    Overnight Events:   Afebrile.  I&D culture with heavy streptococcus costellatus.      Subjective:  Notes soreness in right arm.  Denies any diarrhea, notes normal BM yesterday.  Denies any lightheadedness/dizziness.  Denies nausea, vomiting/abdominal pain.      Allergies:  No Known Allergies    Antimicrobials:  Antiretrovirals  Triumeq    Prophylaxis  None    Antibiotics  IV Vancomycin    Objective:  Vital Signs:  Temperature:  [97.7 F (36.5 C)-98.6 F (37 C)] 97.7 F (36.5 C) (11/13 0400)  Blood pressure (BP): (97-135)/(60-81) 124/76 (11/13 0600)  Heart Rate:  [80-102] 80 (11/13 0600)  Respirations:  [13-19] 14 (11/13 0600)  Pain Score: 0 (11/13 0600)  O2 Device: None (Room air) (11/13 0600)  SpO2:  [97 %-100 %] 99 % (11/13 0600)LASTWT(1)  Wt Readings from Last 1 Encounters:   06/13/16 60.6 kg (133 lb 9.6 oz)       Physical Exam:    GEN: NAD, Cachectic  HEENT: NC/AT, sclera anicteric, left eye blindness, MMM, OP clear  NECK: supple  LUNGS: Symmetric expansion, CTAB  HEART: RRR, no murmurs, no stigmata of endocarditis  ABD: Soft, NT, ND, +nabs  EXT: Well perfused, no edema; right arm with dressing in place, site C/D/I  BACK: Spine NT, no CVAT  SKIN: No rashes or lesions  NEURO: Moves all extremities  PSYCH: Appropriate mood, linear thought process    Laboratory data:  All labs reviewed in EPIC and are notable for:  CBC  Lab Results   Component Value Date    WBC 4.7 06/13/2016    RBC 4.11 06/13/2016    HGB 10.7 06/13/2016    HCT 33.6 06/13/2016    MCV 81.8 06/13/2016    MCHC 31.8 06/13/2016    RDW 15.4 06/13/2016    PLT 382 06/13/2016    MPV 9.1 06/13/2016    SEG 27 06/13/2016    LYMPHS 64 06/13/2016    MONOS 7 06/13/2016    EOS 2 06/13/2016       CHEMISTRY  Lab Results   Component Value Date    NA 133 06/13/2016    K 4.5 06/13/2016    CL 98 06/13/2016    BICARB 26 06/13/2016    BUN 12 06/13/2016    CREAT 0.58 06/13/2016    GLU 91 06/13/2016    [place] 9.1 06/13/2016       Microbiology: All micro reviewed in EPIC and includes:  BLOOD  11/10- NGTD  11/11- NGTD  BODY SITE  11/10- Heavy Strep constellatus     MRSA surveillance 11/10:  negative    CD4:  Pending  VL:  Pending    Hep C RNA:  Pending  Hep C genotype:  Pending  HepBCAb:  Pending  HepBSAb:  Pending  HepBSAg:  Pending  HepAAb:  Pending    Radiology: Images viewed by me an are notable for:  X-ray elbow (11/10):  IMPRESSION:  Focal medial soft tissue swelling at the level of the distal humeral metaphysis that could be compatible with provided history of abscess, without radiographic evidence of underlying osteomyelitis, soft tissue gas, or radiopaque foreign body.    Chest x-ray (11/10):  IMPRESSION:  1. No acute cardiopulmonary disease.    US extremity (11/10):    IMPRESSION:  No evidence of thrombus within the examined  deep veins in the examined veins.  Significant edema noted along the right anterior upper arm. No evidence of abscess. Air is consistent with recent debridement. If there is continued concern for additional abscess collections in the arm, please consider CT with contrast for more sensitive evaluation.    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     # HIV:  Self-reported CD4 count of 399, unknown VL as of ~2 months ago.  Patient was prescribed on Triumeq, but since he has relapsed on IVDU, he notes he has not been compliant for past two months.    --Please send CD4 count (ordered but not sent); follow up on viral load  --Please continue Triumeq 1 tablet daily  --Will need to change his insurance; subsequently [facility]    # Right arm abscess:  2/2 IVDU.  S/p I&D on 11/10 with thin, malodorous, drainage expressed.  Growing strep constellatus, with no MRSA identified on surveillance screen.   --Please D/C IV Vancomycin  --Can transition to Ceftriaxone 1g IV daily while admitted.  At discharge, as long as sensitive,  can transition to oral Amoxicillin 500mg  q8H x 14 day course (end date: 06/23/2016)  --Follow up on sensitivity to streptococcus constellatus (per microlab, sensitivity should be ready tomorrow)    #HCV:  Untreated  --Follow up on HepC genotype, HC VL  --Depending on VL, may need follow up in Hep C clinic    # Homelessness:  Notes having been homeless since 11/2014.  [place] [facility] [place] since then and hopes to move back home to [place], [place].    --Please send HAV IgM/ab panel, Hep B S Ag, Hep B SAb, Hep B C Ab (ordered, but not sent).  If pt is not immune against Hep A, would recommend initiating vaccination while inpt, given the hepA outbreak among the homeless population     Seen and discussed with Dr. Loreta AveWagner who is in agreement with plan above.  We will sign off at this time.  Please page for any additional questions.      Lawrence GulaMinji Kang, MD        ATTENDING ATTESTATION    Subjective:  I reviewed the history.  Patient interviewed and examined with Dr. Pandora LeiterKang.  Review of Systems (ROS): As per the fellow's note.  Past Medical, Family, Social History: As per the fellow's note.    Objective:  I have examined the patient and I concur with the fellow's exam.  Assessment and plan reviewed with the fellow. I agree with the plan as documented.    82M with HIV (not presently on ART), HCV, paranoid schizophrenia, and IVDU (meth) presenting with R arm abscess s/p drainage. Cx grew Strep constellatus, and he is clinically improved on vancomycin. He also presented with diarrhea but attributed this to meth and now improved. He had a brief hypotension episode without fever or white count, which was attributed to restarting of his quetiapine. He has unstable housing and eventually is looking to return to [place]  [place], but for now he will stay in [place], and we will help him engage with local HIV care. He seems mentally stable and motivated to follow through with care. Triumeq was restarted and repeat CD4 and VL  are pending. At this time, would transition his vancomycin to ceftriaxone, which could be transition to amoxicillin or augmentin (depending on final susceptibilities of the Strep) to be continued for a total 2-week duration. We will also help him engage in rehab outpatient treatment facilities.     Please see Dr. Bosie ClosKang's note for further details.    Nadia Viar A. Loreta AveWagner, MD  Infectious Diseases
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
## 2016-06-13 NOTE — Plan of Care (Signed)
Problem: Discharge Planning  Goal: Participation in care planning  Outcome: Progressing toward goal, anticipate improvement over: >48 hours  No discharge orders at this time. Patient calm and cooperative with care. Will continue with current plan of care.     Problem: Infection  Goal: Absence of infection signs and symptoms    Change when soiled, integrity impaired, physican order, or, no longer than 7 days.   Outcome: Progressing toward goal, anticipate improvement over: >48 hours  Patient afebrile, VSS. WBC 4.7. Wound consult done today for right arm abscess wound. Wound care done as ordered. IV Rocephin administered per MD order. Will continue to monitor.     Problem: Pain - Acute  Goal: Communication of presence of pain  Outcome: Progressing toward goal, anticipate improvement over: >48 hours  Patient reports pain at acceptable level so far this shift. Patient educated on pain control options and to notify RN if pain develops/worsens. Will continue to monitor and treat patient's pain as needed.    Problem: Skin Integrity Impaired: Secondary to pressure ulcer(admission or hospital acquired), cellulitis, incontinence, surgical wound/incision, chronic wound or  skin disease  Goal: Absence of infection signs and symptoms    Change when soiled, integrity impaired, physican order, or, no longer than 7 days.   Outcome: Progressing toward goal, anticipate improvement over: >48 hours  Patient afebrile, VSS. WBC 4.7. Wound consult done today for right arm abscess wound. Wound care done as ordered. IV Rocephin administered per MD order. Will continue to monitor.

## 2016-06-13 NOTE — Interdisciplinary (Signed)
Received pt from via wheelchair accompanied by transporter, pt alert/oriented,ambulatory went straight to the restroom, rt upper arm dressing dry/intact, assisted back to bed, oriented to room and call light use. Kept comfortable. VSS. Endorsed to primary RN.

## 2016-06-13 NOTE — Plan of Care (Signed)
Problem: Infection  Goal: Absence of infection signs and symptoms  Outcome: Progressing toward goal, anticipate improvement over: >48 hours  Pt receiving abx as scheduled, temp was 97.9 afebrile, wound nurse consult for RUE. Continue to monitor.     Problem: Pain - Acute  Goal: Communication of presence of pain  Outcome: Progressing toward goal, anticipate improvement over: >48 hours  Pt c/o pain on RUE, given PRN pain med, reassessment pt sleeping, continue to monitor.

## 2016-06-13 NOTE — Interdisciplinary (Signed)
Bedside education completed and rapid response criteria reviewed. Continue current level of care. Will follow up with primary RN throughout shift regarding POC and patient status. Please call RRT/Code Blue if patient status meets criteria.

## 2016-06-13 NOTE — Interdisciplinary (Signed)
Wound/Ostomy RN Consult    Admit date: 06/10/2016    Lawrence Sanders is a 55 year old male  Unit: 821/821B    Review of patient's allergies indicates no known allergies.  Reason for consult: Abscess to right arm s/p I & D in ED on 11/10  Present on Admission?  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 HCV infection    . HIV disease (CMS-HCC)    . Paranoid schizophrenia (CMS-HCC)      Surgery: Paste in Brief Op Note    CBC  Recent Labs      06/11/16   0625  06/11/16   2253  06/12/16   0513  06/13/16   0430   WBC  6.2  5.9  4.5  4.7   RBC  4.34*  3.88*  4.05*  4.11*   HGB  11.5*  10.2*  10.5*  10.7*   HCT  35.2*  31.4*  33.0*  33.6*   MCV  81.1  80.9  81.5  81.8   MCH  26.5  26.3  25.9*  26.0   MCHC  32.7  32.5  31.8*  31.8*   RDW  15.2*  15.3*  15.3*  15.4*   MPV  9.2*  9.3*  9.2*  9.1*   PLT  373*  355  378*  382*   SEG  54   --   38  27   LYMPHS  37   --   49  64   MONOS  8   --   10  7   EOS  2   --   2  2       Chemistry  Recent Labs      06/11/16   0625  06/11/16   2253  06/12/16   0513  06/13/16   0430   NA  131*  129*  136  133*   K  4.6  4.6  4.4  4.5   CL  98  93*  104  98   BICARB  '23  23  22  26   '$ BUN  '12  18  13  12   '$ CREAT  0.63*  0.80  0.59*  0.58*   GLU  87  152*  107*  91   [place]  8.7  8.7  8.6  9.1   MG   --   1.9   --   1.9   PHOS   --   4.1   --   4.2       LFTs  No results for input(s): ALK, AST, ALT, TBILI, DBILI, ALB in the last 72 hours.    Coags  Recent Labs      06/11/16   2253   PT  13.4*   PTT  22.9*   INR  1.2       No results for input(s): PREALB in the last 2160 hours.    No results for input(s): A1C in the last  2160 hours.  No results found for: BLOODCULT    Surgical History No past surgical history on file.    Current Nutrition: Diet Type: Regular    Diet Status: PO    Pain: upon packing    Braden Score: 19    Complications this Hospitalization:     Bed Surface: Isoflex standard  Last Physical Therapy date:    Last Occupational Therapy date:       Image:     Wound measures appx 2 x 1 cm. Malodorous, had just been dressed by RN, removed this packing, previous packing viewed (purulent and malodorous). Depth of wound appx 2 cm. Strings of necrotic tissue still intact. These were trimmed away. The remainder of the wound was clean and bright red. Unable to assess undermining due to tenderness. Odor seemed to resolve after necrotic tissue removed.       Barriers to healing: Cachectic appearance, HIV, homeless    Adjunctive Therapies: antibiotics, repeat imaging shows no more pockets of fluid, drug abuse, mental illness    Current Goal of Treatment: wound healing    Wound Care Recommendations:Premedicate for dressing changes,  irrigate wound w/ saline flush, pack w/ 1/2 " iodoform gauze and cover w/ gauze and burn netting. Change daily.   Recommend patient go to SNF for wound care if able to get funding. Will be very difficult for patient to do on his own due to proximity of wound and access to hygienic conditions.     Consider having general surgery follow outpatient.     Optimize nutrition      Patient/Family Education: educated about wound healing and treatment    Findings and recommendations communicated to: RN Caryl Pina, 1st on call text paged.     Plan: Will sign off. Reconsult for new or deteriorating wounds.    Wound Care nurse:Signed: Alvino Blood Reuel Lamadrid RN, [employer]

## 2016-06-14 ENCOUNTER — Other Ambulatory Visit (HOSPITAL_BASED_OUTPATIENT_CLINIC_OR_DEPARTMENT_OTHER): Payer: Self-pay | Admitting: Pharmacist

## 2016-06-14 DIAGNOSIS — Z590 Homelessness: Secondary | ICD-10-CM

## 2016-06-14 LAB — T CELL SUBSETS, BLOOD
CD4+ T-Cell %: 22 % — ABNORMAL LOW (ref 29–61)
CD4+ T-Cell Abs: 399 cells/uL (ref 250–1200)
CD4:CD8 Ratio: 0.3 — ABNORMAL LOW (ref 0.70–4.00)
CD8+ T-Cell %: 73 % — ABNORMAL HIGH (ref 11–38)
CD8+ T-Cell Abs: 1338 cells/uL — ABNORMAL HIGH (ref 100–800)

## 2016-06-14 LAB — CONFIRM AMPHETAMINES-URINE
Conf. Amphetamine: POSITIVE ng/mL — AB
Conf. MDA: NEGATIVE ng/mL
Conf. MDMA: NEGATIVE ng/mL
Conf. Methamphetamine: POSITIVE ng/mL — AB

## 2016-06-14 LAB — CBC WITH DIFF, BLOOD
ANC-Automated: 1.6 10*3/uL (ref 1.6–7.0)
Abs Eosinophils: 0.1 10*3/uL (ref 0.1–0.5)
Abs Lymphs: 3.2 10*3/uL — ABNORMAL HIGH (ref 0.8–3.1)
Abs Monos: 0.5 10*3/uL (ref 0.2–0.8)
Eosinophils: 2 %
Hct: 33.8 % — ABNORMAL LOW (ref 40.0–50.0)
Hgb: 10.7 gm/dL — ABNORMAL LOW (ref 13.7–17.5)
Lymphocytes: 59 %
MCH: 25.9 pg — ABNORMAL LOW (ref 26.0–32.0)
MCHC: 31.7 g/dL — ABNORMAL LOW (ref 32.0–36.0)
MCV: 81.8 um3 (ref 79.0–95.0)
MPV: 9.1 fL — ABNORMAL LOW (ref 9.4–12.4)
Monocytes: 8 %
Plt Count: 393 10*3/uL — ABNORMAL HIGH (ref 140–370)
RBC: 4.13 10*6/uL — ABNORMAL LOW (ref 4.60–6.10)
RDW: 15.3 % — ABNORMAL HIGH (ref 12.0–14.0)
Segs: 30 %
WBC: 5.4 10*3/uL (ref 4.0–10.0)

## 2016-06-14 LAB — BASIC METABOLIC PANEL, BLOOD
Anion Gap: 9 mmol/L (ref 7–15)
BUN: 16 mg/dL (ref 6–20)
Bicarbonate: 25 mmol/L (ref 22–29)
Calcium: 9.3 mg/dL (ref 8.5–10.6)
Chloride: 99 mmol/L (ref 98–107)
Creatinine: 0.68 mg/dL (ref 0.67–1.17)
GFR: >60 mL/min
Glucose: 101 mg/dL — ABNORMAL HIGH (ref 70–99)
Potassium: 4.3 mmol/L (ref 3.5–5.1)
Sodium: 133 mmol/L — ABNORMAL LOW (ref 136–145)

## 2016-06-14 LAB — IBC - IRON BINDING CAPACITY
Iron Saturation: 22 %
Iron: 55 ug/dL — ABNORMAL LOW (ref 59–158)
Total IBC: 246 ug/dL (ref 148–506)
UIBC: 191 ug/dL (ref 112–346)

## 2016-06-14 LAB — FOLIC ACID, BLOOD: Folate: 9.6 ng/mL

## 2016-06-14 LAB — C-REACTIVE PROTEIN, BLOOD: CRP: 2.3 mg/dL — ABNORMAL HIGH (ref <0.5)

## 2016-06-14 LAB — VITAMIN B12, BLOOD: Vitamin B12: 524 pg/mL (ref 211–946)

## 2016-06-14 MED ORDER — AMOXICILLIN 500 MG OR CAPS
500.0000 mg | ORAL_CAPSULE | Freq: Three times a day (TID) | ORAL | Status: DC
Start: 2016-06-14 — End: 2016-06-15
  Administered 2016-06-14 – 2016-06-15 (×3): 500 mg via ORAL
  Filled 2016-06-14 (×3): qty 1

## 2016-06-14 MED ORDER — ABACAVIR-DOLUTEGRAVIR-LAMIVUD 600-50-300 MG PO TABS
1.00 | ORAL_TABLET | Freq: Every day | ORAL | Status: DC
Start: ? — End: 2016-06-15

## 2016-06-14 MED ORDER — SERTRALINE HCL 50 MG OR TABS
100.00 mg | ORAL_TABLET | Freq: Every day | ORAL | Status: DC
Start: ? — End: 2016-06-15

## 2016-06-14 MED ORDER — ONDANSETRON HCL 4 MG/2ML IV SOLN
4.0000 mg | Freq: Four times a day (QID) | INTRAMUSCULAR | Status: DC | PRN
Start: 2016-06-14 — End: 2016-06-15
  Administered 2016-06-14: 4 mg via INTRAVENOUS
  Filled 2016-06-14: qty 2

## 2016-06-14 MED ORDER — QUETIAPINE FUMARATE 200 MG OR TABS
400.0000 mg | ORAL_TABLET | Freq: Every evening | ORAL | Status: DC
Start: 2016-06-14 — End: 2016-06-15
  Administered 2016-06-14: 400 mg via ORAL
  Filled 2016-06-14: qty 2

## 2016-06-14 MED ORDER — QUETIAPINE FUMARATE 300 MG OR TABS
600.00 mg | ORAL_TABLET | Freq: Every evening | ORAL | Status: DC
Start: ? — End: 2016-06-15

## 2016-06-14 NOTE — Utilization Review (Signed)
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    Subjective:     Patient reports feeling well this AM. Right arm pain significantly improved since admission - good sensation, able to bend elbow. No diarrhea, rather constipated. Tolerating diet well.    Physical Exam   Temperature:  [97.8 F (36.6 C)-98.5 F (36.9 C)] 98 F (36.7 C) (11/11 0751)  Blood pressure (BP): (100-132)/(61-88) 128/83 (11/11 0751)  Heart Rate:  [80-99] 80 (11/11 0751)  Respirations:  [16-18] 18 (11/11 0751)  Pain Score: 0 (11/11 0751)  O2 Device: None (Room air) (11/11 0352)  SpO2:  [97 %-100 %] 100 % (11/11 0751)        Wt Readings from Last 3 Encounters:   06/10/16 57.5 kg (126 lb 12.2 oz)     General: NAD, AOx3, cachectic  HEENT: NCAT  CV: RRR, S1/S2 normal, no murmurs/r/g  Lungs: no increased WOB, CTA bilaterally   Abdomen: Soft, non-distended, nontender, no HSM appreciated  Extremities: bil LE WWP no edema  RUE proximal to elbow s/p I&D with packed wound, surround edema/warmth improved comparing to yesterday, no sig tenderness, at elbow some edema without sig tenderness full active ROM without pain, distal to elbow still edema and warmth without fluctuance today, nontender to palpation   Skin: No jaundice  Neuro: oral tardive dyskinesia, otherwise non-focal  Psych: Normal mood and affect    Data Review   Labs:       Recent Labs     06/10/16   1251  06/11/16   0625   WBC  8.8  6.2   HGB  10.9*  11.5*   HCT  33.1*  35.2*   PLT  371*  373*   NA  131*  131*   K  4.2  4.6   CL  94*  98   BICARB  25  23   BUN  10  12   CREAT  0.69  0.63*   GLU  56*  87   [place]  9.1  8.7   TBILI  0.52   --    ALK  76   --    AST  35   --    ALT  22   --    ALB  3.4*   --    CRP  16.30*  14.20*     Micro:  BCx drawn after Abx - pending  Wound -  G/S   Heavy polymorphonucleated white blood cells   Heavy red blood cells   Heavy Gram positive cocci   Few Gram negative bacilli     Radiology/Imaging:  06/2016 ultrasound  FINDINGS:  Duplex Doppler imaging was performed with visualization of the internal jugular, subclavian, and axillary veins on the right and the left subclavian vein. There is no evidence of intraluminal thrombus. Normal spectral waveforms are noted with respiratory variation. Color flow is normal.  In addition, targeted ultrasound was performed along the right anterior upper arm, 3 cm superior to the elbow. In this region, the muscle and subcutaneous tissues are edematous. Linear collection of gas is noted consistent with recent debridement. No fluid collection or hematoma seen.  IMPRESSION:  No evidence of thrombus within the examined deep veins in the examined veins.  Significant edema noted along the right anterior upper arm. No evidence of abscess. Air is consistent with recent debridement. If there is continued concern for additional abscess collections in the arm, please consider CT with contrast for more sensitive evaluation.    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    #RUE SSTI with Abscess in the setting of IVDA  Clinically proximal arm improving, CRP improving. Elbow ROM intact without pain, low suspicion for joint involvement. Distal to elbow, still edema but without fluctuance. Neurovascularly intact. Low suspicion for nec fasc.   - continue IV vanc  - pending cx  - discussed with radiology regarding ultrasound, only evaluated arm proximal to elbow for DVT. Per radiology, distal to elbow superficial veins are not routinely evaluated given no deeps veins. Will  monitor clinically and if worsening distally will order ultrasound for distally.     #HIV  ORoxy Mannson board, appreciate reccs  - resume Triumeq per ORoxy Manns - HIV VL, T-cell subset  - syphilis RPR titer     #HCV   Treatment naiive. No stigmata of cirrhosis on exam. LFTs wnl. Pltlts high.   - VL  - GT  - Hep B/A panel  - outpt f/u    #LUTS  UA neg  - tamsulosin     #IVDA   No murmurs on exam or other stigmata of endocarditis. No fever/leukocytosis. Follow-up on BCx.  - UTox   - SW consult    #Paranoid Schizophrenia  On Seroquel at home but signs of tardive dyskinesia on exam.   - home Sertraline '50mg'$  QHS  - tardive dyskinesia ok if patient tolerating, resume Seroquel '600mg'$  QHS     #Normocytic Anemia  Hgb at baseline    FEN: Diet Type: Regular      regular diet  Thromboprophylaxis: pharmacologic:  Lovenox  Glucose Management: can stop POC fingersticks, euglycemic  Stool: Miralax for constipation, no diarrhea  Indwelling lines: PIV  De-escalation of ABX: pending clinical improvement  #Code Status: DNR/DNI    ==06/12/2016  Subjective and Interval Events  Patient feels well this morning.  Rapid response called last night for hypotension and lightheadedness; recovered with IVF.  Per patient, he always feels lightheaded if he takes 600 mg of Seroquel after not having taken any for a few days.  He doesn't get lightheaded if he starts with 300 mg for a few days before advancing to 600 mg.  No lightheadedness currently.  Denies CP, SOB, nausea, vision changes, LOC during episode.    Objective  Temperature:  [97.5 F (36.4 C)-98.4 F (36.9 C)] 98.4 F (36.9 C) (11/12 1930)  Blood pressure (BP): (65-133)/(29-81) 126/69 (11/12 1930)  Heart Rate:  [88-118] 98 (11/12 1930)  Respirations:  [15-20] 17 (11/12 1930)  Pain Score: 5 (11/12 1930)  O2 Device: None (Room air) (11/12 1930)  SpO2:  [95 %-100 %] 98 % (11/12 1930)     General: Thin, well-appearing male in NAD  HEENT: Clear sclera, MMM  CV: RRR, no m/r/g  Pulm: CTABm no  w/r/r  Abd: Normoactivs BS, soft, ND, NT  Ext: Right proximal arm bandaged, distal arms still swollen and warm but less so, less TTP overall, 2+  radial pulse and cap refill <2 sec  Neuro: AAOx3, sensation intact in right hand and fingers, able to move fingers    ASSESSMENT AND PLAN  Lawrence Sanders is a 55 year old male with HIV on Triumeq, HCV (untreated), active IVDA (meth), paranoid schizophrenia, and 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    #RUE Abscess in the setting of IVDA: Improving on vancomycin.  Cultures pending.  Elbow with full ROM and no pain. Distal arm without abscess on [place].  - Continue IV vancomycin  - F/u cxs    #HIV: Owen on board following.  - Continue Triumeq  - F/u HIV VL, T-cell subset  - F/u syphilis RPR titer   - Appreciate Roxy Manns recs    #HCV: Treatment naiive. No stigmata of cirrhosis on exam. LFTs wnl. Plts high.   - F/u VL, genotype  - Referral to Hep C clinic at dispo    #LUTSt: UA neg.  - Continue tamsulosin     #IVDA: No murmurs on exam or other stigmata of endocarditis. No fever/leukocytosis.   - F/u blood cxs  - SW consult    #Paranoid Schizophrenia: On Seroquel at home, but has missed many doses prior to admission.  Episode of hypotension yesterday after re-starting home 600 mg dose.  Per patient, usually starts with 300 mg for a few days then increases to full 600 mg dose.  - Decrease Seroquel to 300 mg qhs; plan to increase to 600 mg in 2 days.  - Continue home Sertraline '50mg'$  QHS    #Normocytic Anemia: Hgb at baseline    Diet: Regular  DVT JOA:[id number]    Code Status: DNAR - Full Care    ==06/13/2016  Subjective:     Overnight no acute events. Tolerated '300mg'$  seroquel well.     Physical Exam   Temperature:  [97.7 F (36.5 C)-98.6 F (37 C)] 97.7 F (36.5 C) (11/13 0400)  Blood pressure (BP): (97-135)/(60-81) 124/76 (11/13 0600)  Heart Rate:  [80-102] 80 (11/13 0600)  Respirations:  [13-19] 14 (11/13 0600)  Pain Score: 0 (11/13  0600)  O2 Device: None (Room air) (11/13 0600)  SpO2:  [97 %-100 %] 99 % (11/13 0600)    General: NAD, AOx3, cachectic  CV: RRR, S1/S2 normal, no murmurs/r/g  Lungs: no increased WOB, CTA bilaterally   Abdomen: Soft, non-distended, nontender  Extremities: bil LE WWP no edema  RUE proximal to elbow s/p I&D with packed wound, surround edema/warmth improved, tenderness at site, at elbow mild edema without tenderness with full active ROM, distal to elbow still edema and warmth without fluctuance nontender to palpation   Skin: No jaundice  Neuro: oral tardive dyskinesia, otherwise non-focal  Psych: Normal mood and affect    Micro:  BCx - NGTD  Wound - G/S   Heavy polymorphonucleated white blood cells   Heavy red blood cells   Heavy Gram positive cocci   Few Gram negative bacilli   CX heavy Strep constellatus    Radiology/Imaging:  06/11/2016  IMPRESSION:  Skin thickening and edema noted along the right forearm but no evidence of abscess, fluid collection, or hematoma.    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    #RUE SSTI with Abscess in  the setting of IVDA  Clinically proximal arm improving, CRP improving. Elbow ROM intact without pain, low suspicion for joint involvement. Distal to elbow, still edema but without fluctuance. Neurovascularly intact. Low suspicion for nec fasc. Distal arm without abscess.  - deescalate from vanc to ceftriaxone per Owen/ID  - f/u cx/sensitivities - wound grew Strep constellatus heavy  - BCx NGTD  - wound RN consult today for reccs and for patient education    #HIV  Owen on board, appreciate reccs  - resumed Triumeq   - HIV VL, T-cell subset - not done on weekends, pending  - syphilis RPR titer     #HCV   Treatment naiive. No stigmata of cirrhosis on exam. LFTs wnl.  Pltlts high.   - VL/GT  - Hep B/A panel  - outpt f/u    #LUTS  UA neg  - tamsulosin     #IVDA   No murmurs on exam or other stigmata of endocarditis. No fever/leukocytosis. Follow-up on BCx.  - UTox   - SW consult    #Paranoid Schizophrenia  Did not tolerate '600mg'$  dose of Seroquel. Per patient, at home when he missed doses of Seroquel and suddenly resumed '600mg'$  dose, would feel lightheaded. This happened Sat evening. Pt reports that at home he self titrates Seroquel, starting from '300mg'$  QHS. Tolerated '300mg'$  dose.  - home Sertraline '50mg'$  QHS  - seroquel '300mg'$  QHS tonight, slowly uptitrate  - downgrade to M/S    #Normocytic Anemia  Hgb at baseline    FEN: Diet Type: Regular      regular diet  Thromboprophylaxis: pharmacologic:SCD: Off Lovenox  Stool: Miralax for constipation, no diarrhea  Indwelling lines: PIV  De-escalation of ABX: vanc -> ctx today  #Code Status: DNR/DNI     ==06/14/2016  Subjective:     Doing well. Reports loose watery stools now since bowel regimen.     Physical Exam   Temperature:  [97.6 F (36.4 C)-98.7 F (37.1 C)] 98 F (36.7 C) (11/14 0506)  Blood pressure (BP): (102-118)/(54-71) 102/56 (11/14 0506)  Heart Rate:  [94-112] 106 (11/14 0506)  Respirations:  [16-18] 18 (11/14 0506)  Pain Score: NA (pre med, non-pain or scheduled) (11/14 0513)  O2 Device: None (Room air) (11/14 0506)  SpO2:  [97 %-99 %] 98 % (11/14 0506)    General: NAD, AOx3, cachectic  CV: RRR, S1/S2 normal, no murmurs/r/g  Lungs: no increased WOB, CTA bilaterally   Abdomen: Soft, non-distended, nontender  Extremities: bil LE WWP no edema  RUE proximal to elbow s/p I&D with packed wound, significantly improved this AM in edema/warmth/tenderness, elbow minimal edema with full ROM, distal to elbow edema/warmth/erythema significantly improved  Skin: No jaundice  Neuro: oral tardive dyskinesia, otherwise non-focal  Psych: Normal mood and affect    Assessment and Plan     Lawrence Sanders is a 55 year old male with HIV  (self-reported CD4 = 399 ~81moago, VL unknown) on Triumeq, HCV (untreated), active IVDA (meth), paranoid schizophrenia, and 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    #Diarrhea  In the setting of initially aggressive bowel regimen. Stopped bowel regimen this AM.   - monitor for stool eps  - if continued diarrhea will send C diff    #RUE SSTI with Abscess in the setting of IVDA  Clinically proximal arm improving, CRP improving.   - will switch to PO amox today   - BCx NGTD    #HIV  ORoxy Mannson  board, appreciate reccs  - resumed Triumeq   - HIV VL, T-cell subset - not done on weekends, pending  - syphilis RPR titer     #HCV   Treatment naiive. No stigmata of cirrhosis on exam. LFTs wnl. Pltlts high. Immune to hep A and B with hx of infection of hep B.   - VL/GT  - outpt f/u    #LUTS  - tamsulosin     #IVDA   No murmurs on exam or other stigmata of endocarditis. No fever/leukocytosis. Follow-up on BCx.  - UTox   - SW consult    #Paranoid Schizophrenia  Did not tolerate '600mg'$  dose of Seroquel. Per patient, at home when he missed doses of Seroquel and suddenly resumed '600mg'$  dose, would feel lightheaded. This happened Sat evening. Pt reports that at home he self titrates Seroquel, starting from '300mg'$  QHS. Tolerated '300mg'$  dose.  - home Sertraline '50mg'$  QHS  - seroquel '400mg'$  QHS tonight    #Normocytic Anemia  Hgb at baseline    FEN: Diet Type: Regular      regular diet  Thromboprophylaxis: pharmacologic:SCD: Patient Refused Lovenox  Stool: Miralax for constipation, no diarrhea  Indwelling lines: PIV  De-escalation of ABX: PO Amox today  #Code Status: DNR/DNI

## 2016-06-14 NOTE — Progress Notes (Signed)
Encounter opened to query outside prescription records to attempt medication reconciliation.

## 2016-06-14 NOTE — Interdisciplinary (Signed)
[id number] is sending a message to: Scarlette CalicoKAREN M YUN / [phone]    [id number]     [address], [place].  Patient had 2 documented watery BM during the day.  Patient states he has had them since admission.  Would you like to order for C.diff? Thank you.

## 2016-06-14 NOTE — Interdisciplinary (Signed)
Patient had watery BM x 2, and complaining of Nausea, paged first to call  "[address] Dani, had two watery BMs and is complaining of Nausea. Any PRNs for nausea?". Will continue to monitor.

## 2016-06-14 NOTE — Transition Nurse Specialist (Signed)
HIV TNS Update:    TNS met with patient today to follow-up on [place] MCAL. Patient has TNS card at bedside, but states he has not yet called the [facility] number that was provided to him yesterday.   Patient has been scheduled for [facility] on 11/21 @ 1300; he has been updated of this visit and what it entails. Of note, this is a nurse-only clinic visit due to pt's [place] (requested that clinic social worker also check in with pt at this time regarding his insurance cvg). He cannot be scheduled with a provider until he transfers out of this [place].    Page sent to [employer], alerting him that pt is not yet linked to [facility] but could benefit from housing/substance abuse community resources.  Page sent to [employer] and telephone discussion about pt's current cvg. She states she will visit this pt at the bedside to provide him with instructions on how to transfer his insurance to [place].      TNS to continue to assess needs and follow post-discharge as needed.  Celedonio Savage, [place], FNP-BC, [place]

## 2016-06-14 NOTE — Interdisciplinary (Signed)
06/14/16 1501   Assessment   Assessment Type Initial   Referral Information   Referral Type Homeless;Crisis Intervention;Community Resources/Referrals;Advanced Care Planning;Substance Abuse   Social Assessment   Prior to Level of Function Ambulatory/Independent with ADL's   Primary Caretaker(s) Alone   Social Determinates of Health   Living Arrangements Homeless   Support Systems None   Type of Residence Homeless   Home Care Services No   Anticipated Supplies/Services  Too soon to be determined   Has discharge transport been arranged? No   Careers information officerTransportation Bus   [facility] Care [facility]   Medication Compliance Non-compliant;Misses medication;Health literacy issues;Medication access issues   Income Information   Income Source Government aid  (SSI)$900+   Income/Expense Information Income meets expenses   Mental Health Assessment   Mental Status Sadness/Depressed;Anxiety;Mood swings;Helplessness;Pessimism;Impaired Memory/Concentration;Indecisiveness   Behavioral Assessment Worrying;Loss of interest/motivation   Physical Assessment Lack of energy   Mental Status - Orientation x4   Notify Treatment Team to Assess Patient's Capacity? No   Adjustment to Illness   Patient's Adjustment Anger/Frustration;Fear;Anxiety;Grief/Sadness   Family's Adjustment Family not available   Over the past two weeks, how often have you been bothered by any of the following problems?   Little interest or pleasure in doing things 0   Feeling down, depressed, or hopeless 0   Depression Scale Subtotal - If Greater than or Equal to 3, complete the following two questions 0   Substance Abuse History (CAGE-AID)   Have you ever felt you ought to cut down on your drinking or drug use? (!) 1   Have people annoyed you by criticizing your drinking or drug use? (!) 1   Have you ever felt bad or guilty about your drinking or drug use? (!) 1   Have you ever had a drink or used drugs first thing in the morning to steady your nerves or to get rid  of a hangover? (!) 1   Number of "Yes" Responses (!) 4   If CAGE-AID Score is 2 or greater, note interventions provided encouraged patient to attend 90 AA meetings in next 90 days, [facility], establish a psychiatrists and mental health counselor, and consider inpatient treatment   Substance Abuse History Active IVDA- METH   Referral To   Financial Resources Medicaid  ([place]   [facility];Transportation;Health education;Educational/vocational   Plans/Interventions/Discharge   Plan/Interventions ID stressors precipitating decompensation;Provide education & resources for substance/alcohol abuse;Resources given;Explore needs and options for aftercare, provide referrals   Anticipated Discharge Destination Other (Comment)  [facility] D[facility] then shelters likely)   Discharge Information for Patient - THIS GROUP FILES TO THE PATIENT'S AVS   Information from Social Work: encouraged patient to attend 90 AA meetings in next 90 days, [facility], establish a psychiatrists and mental health counselor, and consider inpatient treatment   Social Worker Name and Phone Number: Fayrene FearingJames [phone]     D/C Plan: Patient is homeless and [place]. [facility] for [facility] to services he needs. Patient may chose to discharge to the shelters.     [name] is a 55 y/o AA male admitted for R arm abscess. Patient has a h/o schizophrenia(untreated), homelessness, HIV+, HCV, blind in L eye, and IVDA(Meth). Patient receives SSDI, has [place]cal[place] [address] and [place]. Patient reports no family in [place] and chart review found he reported a desire to move to  [place] by end of month.     Patient reports he was trying to get into a [facility] shelter and was provided education of the services and process for accessing those services. Patient [facility] for  additional support specifically  access housing and substance abuse treatment options. Patient voiced a plan to [facility] to establish care there. Social work services made available as needed.     Patient identified his IVDA as the main issue he has prohibiting stable housing. LCSW provided motivational interviewing focusing the patient on establishing multiple levels of treatment and to consider dual diagnosis treatment. Patient is somewhat agreeable although his motivation is questionable. Patient is at high risk for continued IVDA based on his h/x and current lack of motivation. Social work services made available as needed.     Plan:   Bus to shelters likely  Patient encouraged to [facility] care at [facility], [facility], and establish a PCP  Patient encouraged to seek substance abuse treatment  Page Child psychotherapistsocial worker on treatment team as needed    Hillis RangeJames Bishoy Cupp MSW  [phone] cell  [phone] pager

## 2016-06-14 NOTE — Progress Notes (Signed)
Medicine Resident Inpatient Progress Note:    ID:     Lawrence Sanders is a 55 year old male with HIV (self-reported CD4 = 399 ~7364mo ago, VL unknown) on Triumeq, HCV (untreated), active IVDA (meth), paranoid schizophrenia, and 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    Subjective:     Doing well. Reports loose watery stools now since bowel regimen.     Physical Exam   Temperature:  [97.6 F (36.4 C)-98.7 F (37.1 C)] 98 F (36.7 C) (11/14 0506)  Blood pressure (BP): (102-118)/(54-71) 102/56 (11/14 0506)  Heart Rate:  [94-112] 106 (11/14 0506)  Respirations:  [16-18] 18 (11/14 0506)  Pain Score: NA (pre med, non-pain or scheduled) (11/14 0513)  O2 Device: None (Room air) (11/14 0506)  SpO2:  [97 %-99 %] 98 % (11/14 0506)    Wt Readings from Last 3 Encounters:   06/13/16 60.6 kg (133 lb 9.6 oz)     General: NAD, AOx3, cachectic  CV: RRR, S1/S2 normal, no murmurs/r/g  Lungs: no increased WOB, CTA bilaterally   Abdomen: Soft, non-distended, nontender  Extremities: bil LE WWP no edema  RUE proximal to elbow s/p I&D with packed wound, significantly improved this AM in edema/warmth/tenderness, elbow minimal edema with full ROM, distal to elbow edema/warmth/erythema significantly improved  Skin: No jaundice  Neuro: oral tardive dyskinesia, otherwise non-focal  Psych: Normal mood and affect  ?  Data Review   Labs:  Recent Labs      06/11/16   2253  06/12/16   0513  06/12/16   1450  06/13/16   0430   WBC  5.9  4.5   --   4.7   HGB  10.2*  10.5*   --   10.7*   HCT  31.4*  33.0*   --   33.6*   PLT  355  378*   --   382*   NA  129*  136   --   133*   K  4.6  4.4   --   4.5   CL  93*  104   --   98   BICARB  23  22   --   26   BUN  18  13   --   12   CREAT  0.80  0.59*   --   0.58*   GLU  152*  107*   --   91   [place]  8.7  8.6   --   9.1   MG  1.9   --    --   1.9   PHOS  4.1   --    --   4.2   PT  13.4*   --    --    --    PTT  22.9*   --    --    --    INR  1.2   --    --    --    CRP   --   7.30*   --    3.80*   TROPONIN  <0.01  <0.01  <0.01   --    LACTATE  1.7   --    --    --      Micro:  BCx - NGTD  Wound - G/S   Heavy polymorphonucleated white blood cells   Heavy red blood cells   Heavy Gram positive cocci   Few Gram negative bacilli  CX heavy Strep constellatus - sens to ctx/pnc g      Assessment and Plan     Lawrence RiseKent Dungee is a 55 year old male with HIV (self-reported CD4 = 399 ~9058mo ago, VL unknown) on Triumeq, HCV (untreated), active IVDA (meth), paranoid schizophrenia, and 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    #Diarrhea  In the setting of initially aggressive bowel regimen. Stopped bowel regimen this AM.   - monitor for stool eps  - if continued diarrhea will send C diff  ?  #RUE SSTI with Abscess in the setting of IVDA  Clinically proximal arm improving, CRP improving.   - will switch to PO amox today   - BCx NGTD    #HIV  Owen on board, appreciate reccs  - resumed Triumeq   - HIV VL, T-cell subset - not done on weekends, pending  - syphilis RPR titer     #HCV   Treatment naiive. No stigmata of cirrhosis on exam. LFTs wnl. Pltlts high. Immune to hep A and B with hx of infection of hep B.   - VL/GT  - outpt f/u    #LUTS  - tamsulosin      #IVDA   No murmurs on exam or other stigmata of endocarditis. No fever/leukocytosis. Follow-up on BCx.  - UTox   - SW consult    #Paranoid Schizophrenia  Did not tolerate 600mg  dose of Seroquel. Per patient, at home when he missed doses of Seroquel and suddenly resumed 600mg  dose, would feel lightheaded. This happened Sat evening. Pt reports that at home he self titrates Seroquel, starting from 300mg  QHS. Tolerated 300mg  dose.  - home Sertraline 50mg  QHS  - seroquel 400mg  QHS tonight    #Normocytic Anemia  Hgb at baseline    FEN: Diet Type: Regular      regular diet  Thromboprophylaxis: pharmacologic:SCD: Patient Refused Lovenox  Stool: Miralax for constipation, no diarrhea  Indwelling lines: PIV  De-escalation of ABX: PO Amox today  #Code  Status: DNR/DNI     Patient seen and discussed with attending physician Dr. Selinda MichaelsBouland.   [place] CapuchinJian Zhang, MD  Internal Medicine PGY3  [phone]    ATTENDING PROGRESS NOTE ATTESTATION     Subjective  I have reviewed the above interval history.     Objective  I have examined the patient and concur with the resident exam.     Assessment and Plan   I agree with the resident care plan.     See the resident note for further details.

## 2016-06-14 NOTE — Plan of Care (Signed)
Problem: Discharge Planning  Goal: Participation in care planning  Outcome: Progressing toward goal, anticipate improvement over: >48 hours  Patient participates in care planning. No discharge order at this time. Plan of care ongoing.    Problem: Infection  Goal: Absence of infection signs and symptoms    Change when soiled, integrity impaired, physican order, or, no longer than 7 days.   Outcome: Progressing toward goal, anticipate improvement over: Next 24-48 hours  RUE with dressing in place, dressing clean dry and intact. Continues on antibiotics. Afebrile. Denies chills.  BP 117/76 (BP Location: Left arm, BP Patient Position: Supine)  Pulse 89  Temp 97.9 F (36.6 C)  Resp 17  Ht '6\' 2"'$  (1.88 m)  Wt 60.6 kg (133 lb 9.6 oz)  SpO2 99%  BMI 17.15 kg/m2  Results for TREAVOR, BLOMQUIST (MRN [id number]) as of 06/14/2016 12:40   Ref. Range 06/11/2016 06:25 06/11/2016 22:53 06/12/2016 05:13 06/13/2016 04:30 06/14/2016 07:23   WBC Latest Ref Range: 4.0 - 10.0 1000/mm3 6.2 5.9 4.5 4.7 5.4       Problem: Pain - Acute  Goal: Communication of presence of pain  Outcome: Goal Met  Patient denies pain at this time. Will continue to monitor.    Problem: Skin Integrity Impaired: Secondary to pressure ulcer(admission or hospital acquired), cellulitis, incontinence, surgical wound/incision, chronic wound or  skin disease  Goal: Prevent further skin compromise  Outcome: Progressing toward goal, anticipate improvement over: >48 hours  RUE with dressing in place, dressing clean dry and intact. No further skin compromise noted. Will continue to monitor.  Goal: Absence of infection signs and symptoms    Change when soiled, integrity impaired, physican order, or, no longer than 7 days.   Outcome: Progressing toward goal, anticipate improvement over: Next 24-48 hours  RUE with dressing in place, dressing clean dry and intact. Continues on antibiotics. Afebrile. Denies chills.  BP 117/76 (BP Location: Left arm, BP Patient Position: Supine)   Pulse 89  Temp 97.9 F (36.6 C)  Resp 17  Ht '6\' 2"'$  (1.88 m)  Wt 60.6 kg (133 lb 9.6 oz)  SpO2 99%  BMI 17.15 kg/m2  Results for MARKEZ, DOWLAND (MRN [id number]) as of 06/14/2016 12:40   Ref. Range 06/11/2016 06:25 06/11/2016 22:53 06/12/2016 05:13 06/13/2016 04:30 06/14/2016 07:23   WBC Latest Ref Range: 4.0 - 10.0 1000/mm3 6.2 5.9 4.5 4.7 5.4

## 2016-06-14 NOTE — Progress Notes (Addendum)
Pharmacist Admission Medication Reconciliation    Date Performed: 06/14/2016    Information obtained from:    Patient Interview   Pharmacy (called) [facility] [address], [place] [phone])    Allergies: Confirmed  No Known Allergies    Number of medications reviewed: 3  Total number of changes: 3  High risk medications on medication list: No  (High risk med changed? No)    Changes made to PTA (Prior to Admission) medication list:   . Number of medications added: 3 - all medications on list below    Adherence issues identified: Yes - Patient very forthcoming regarding his lapses in medication adherence related to ilicit drug use.  This is supported by his lapse in medication refill history, [facility] he [facility] living in [place] on 04/18/16 and states he still had some remaining Triumeq and quetiapine. Patient states he will "do well" and take his medications consistently for ~ 1 week but then go off of them for ~ 1 month due to drug use.  We discussed the importance of not stretching out medications to minimize risk of HIV resistance. He expressed understanding.    Level of patient's understanding of meds:   High (understands indication, dose, strength and frequency of most medications). Patient knows the medications he was prescribed well.    Confident in accuracy of medication list?  YES - Able to confirm accuracy of PTA med list with reliable sources    PTA medication list has been updated to reflect the best possible medication history. Providers notified of any findings and updates to PTA medication list in Epic.  Prior to Admission Medications   Prescriptions Last Dose Informant Patient Reported? Taking?   QUEtiapine (SEROQUEL) 300 MG tablet   Yes Yes   Sig: Take 600 mg by mouth at bedtime.   abacavir-dolutegravir-lamivudine (TRIUMEQ) 600-50-300 MG TABS   Yes Yes   Sig: Take 1 tablet by mouth daily.   sertraline (ZOLOFT) 50 MG tablet   Yes Yes   Sig: Take 100 mg by mouth  daily.      Facility-Administered Medications: None        [id number] [place] - [address]     Admit medication reconciliation performed by pharmacist with the following findings:  Paged primary team the folllowing:    . If patient tolerating sertraline 50mg  daily (current inpatient dose) would consider increase to prior prescribed dose of 100mg  po daily  . If patient tolerating quetiapine 400mg  (current inpatient dose) would consider increase to prior prescribed dose of 600mg  po qpm, if appropriate. (last documented QTc in epic 474 on 06/11/16)    Recommendations for discharge:   Patient requesting refills on all medications at time of discharge (left meds with another homeless person who moves around a lot, assuming they are now lost)   Patient unable to fill meds at the [facility] due to [facility] [place] [facility] (not contracted w/ [facility]).  [facility].    Charolett BumpersShannon Vetra Shinall, PharmD, BCPS, AAHIVP  Clinical Pharmacist - Transitions of Care, HIV, HF  Pager [phone]

## 2016-06-15 DIAGNOSIS — Z720 Tobacco use: Secondary | ICD-10-CM

## 2016-06-15 DIAGNOSIS — L0291 Cutaneous abscess, unspecified: Secondary | ICD-10-CM

## 2016-06-15 DIAGNOSIS — L0390 Cellulitis, unspecified: Secondary | ICD-10-CM

## 2016-06-15 LAB — BODY SITE CULTURE W/GRAM STAIN, AEROBIC ONLY

## 2016-06-15 LAB — CBC WITH DIFF, BLOOD
ANC-Automated: 3.3 10*3/uL (ref 1.6–7.0)
Abs Eosinophils: 0.1 10*3/uL (ref 0.1–0.5)
Abs Lymphs: 3 10*3/uL (ref 0.8–3.1)
Abs Monos: 0.6 10*3/uL (ref 0.2–0.8)
Eosinophils: 1 %
Hct: 34.6 % — ABNORMAL LOW (ref 40.0–50.0)
Hgb: 10.9 gm/dL — ABNORMAL LOW (ref 13.7–17.5)
Lymphocytes: 43 %
MCH: 25.8 pg — ABNORMAL LOW (ref 26.0–32.0)
MCHC: 31.5 g/dL — ABNORMAL LOW (ref 32.0–36.0)
MCV: 82 um3 (ref 79.0–95.0)
MPV: 9.1 fL — ABNORMAL LOW (ref 9.4–12.4)
Monocytes: 8 %
Plt Count: 419 10*3/uL — ABNORMAL HIGH (ref 140–370)
RBC: 4.22 10*6/uL — ABNORMAL LOW (ref 4.60–6.10)
RDW: 15.4 % — ABNORMAL HIGH (ref 12.0–14.0)
Segs: 47 %
WBC: 6.9 10*3/uL (ref 4.0–10.0)

## 2016-06-15 LAB — BLOOD CULTURE
Blood Culture Result: NO GROWTH
Blood Culture Result: NO GROWTH

## 2016-06-15 LAB — T CELL SUBSETS, BLOOD
CD4+ T-Cell %: 20 % — ABNORMAL LOW (ref 29–61)
CD4+ T-Cell Abs: 387 cells/uL (ref 250–1200)
CD4:CD8 Ratio: 0.26 — ABNORMAL LOW (ref 0.70–4.00)
CD8+ T-Cell %: 78 % — ABNORMAL HIGH (ref 11–38)
CD8+ T-Cell Abs: 1475 cells/uL — ABNORMAL HIGH (ref 100–800)

## 2016-06-15 LAB — BASIC METABOLIC PANEL, BLOOD
Anion Gap: 10 mmol/L (ref 7–15)
BUN: 14 mg/dL (ref 6–20)
Bicarbonate: 25 mmol/L (ref 22–29)
Calcium: 9.3 mg/dL (ref 8.5–10.6)
Chloride: 96 mmol/L — ABNORMAL LOW (ref 98–107)
Creatinine: 0.68 mg/dL (ref 0.67–1.17)
GFR: >60 mL/min
Glucose: 120 mg/dL — ABNORMAL HIGH (ref 70–99)
Potassium: 4.8 mmol/L (ref 3.5–5.1)
Sodium: 131 mmol/L — ABNORMAL LOW (ref 136–145)

## 2016-06-15 LAB — C-REACTIVE PROTEIN, BLOOD: CRP: 1.5 mg/dL — ABNORMAL HIGH (ref <0.5)

## 2016-06-15 LAB — SMOKING OR VAPING CESSATION - KICK IT ~~LOC~~

## 2016-06-15 MED ORDER — SERTRALINE HCL 50 MG OR TABS
50.0000 mg | ORAL_TABLET | Freq: Every evening | ORAL | 0 refills | Status: AC
Start: 2016-06-15 — End: ?

## 2016-06-15 MED ORDER — AMOXICILLIN 500 MG OR CAPS
500.0000 mg | ORAL_CAPSULE | Freq: Three times a day (TID) | ORAL | 0 refills | Status: DC
Start: 2016-06-15 — End: 2016-06-15

## 2016-06-15 MED ORDER — TAMSULOSIN HCL 0.4 MG PO CAPS
0.4000 mg | ORAL_CAPSULE | Freq: Every day | ORAL | 3 refills | Status: AC
Start: 2016-06-16 — End: ?

## 2016-06-15 MED ORDER — AMOXICILLIN-POT CLAVULANATE 875-125 MG OR TABS
1.0000 | ORAL_TABLET | Freq: Two times a day (BID) | ORAL | 0 refills | Status: AC
Start: 2016-06-15 — End: 2016-06-29

## 2016-06-15 MED ORDER — AMOXICILLIN-POT CLAVULANATE 875-125 MG OR TABS
1.0000 | ORAL_TABLET | Freq: Two times a day (BID) | ORAL | 0 refills | Status: DC
Start: 2016-06-15 — End: 2016-06-15

## 2016-06-15 MED ORDER — QUETIAPINE FUMARATE 400 MG OR TABS
400.0000 mg | ORAL_TABLET | Freq: Every evening | ORAL | 3 refills | Status: AC
Start: 2016-06-15 — End: ?

## 2016-06-15 MED ORDER — ABACAVIR-DOLUTEGRAVIR-LAMIVUD 600-50-300 MG PO TABS
1.0000 | ORAL_TABLET | Freq: Every day | ORAL | 3 refills | Status: AC
Start: 2016-06-16 — End: ?

## 2016-06-15 MED ORDER — AMOXICILLIN-POT CLAVULANATE 875-125 MG OR TABS
1.0000 | ORAL_TABLET | Freq: Two times a day (BID) | ORAL | Status: DC
Start: 2016-06-15 — End: 2016-06-15
  Administered 2016-06-15: 1 via ORAL
  Filled 2016-06-15: qty 1

## 2016-06-15 MED ORDER — PNEUMOCOCCAL VAC POLYVALENT 25 MCG/0.5ML IJ INJ (CUSTOM)
0.5000 mL | INJECTION | INTRAMUSCULAR | Status: AC
Start: 2016-06-15 — End: 2016-06-15
  Administered 2016-06-15: 0.5 mL via INTRAMUSCULAR
  Filled 2016-06-15: qty 0.5

## 2016-06-15 MED ORDER — INFLUENZA VAC SPLIT QUAD 0.5 ML IM SUSY
0.5000 mL | PREFILLED_SYRINGE | INTRAMUSCULAR | Status: AC
Start: 2016-06-15 — End: 2016-06-15
  Administered 2016-06-15: 0.5 mL via INTRAMUSCULAR
  Filled 2016-06-15: qty 0.5

## 2016-06-15 NOTE — Interdisciplinary (Addendum)
Pt d/c education, paperwork, instructions given to pt by Mel, RN. Alert/oriented x4, vss, dressing changed, supplies given for d/c, pt to f/u with shelter resources for wound changes daily, [facility] by [facility] educated by pharmacy regarding [facility], unable to fill Rx at hospital pharmacy, so [facility]. All belongings with pt at time of d/c. All questions answered at this time.

## 2016-06-15 NOTE — Progress Notes (Signed)
I have evaluated the patient today; he will be discharged from the hospital today.     Today, his physical examination is notable for edema and erythema in the RUE have resolved. 3cm surgical wound from I&D is clean and without drainage.    Please refer to the Discharge Summary for further details.    I spent 40 minutes on the patient's care unit in the preparation and execution of the hospital discharge.    Ned Cardaniel [facility]

## 2016-06-15 NOTE — Interdisciplinary (Signed)
Sacral mepilex applied for protection.

## 2016-06-15 NOTE — Discharge Instructions (Addendum)
[facility] on 11/21 @13 :00    HIV COMMUNITY RESOURCES    [facility]  [address], [place], [place] [id number]  [phone]  Types of services: emergency food services, support/discussion groups, behavioral health    [facility]  [address], [address], [place], [place] [id number]  Case management: [phone]  Transportation: [phone]  Types of services: unassisted transportation services, case management    [facility]  [address], [place], [place] [id number]  [phone]  Types of services: peer advocacy, moving services, haircuts/styles, social recreation    [employer]  [address], [address], [place], [place] [id number]  [phone] (ext [phone] for case management)  Types of services: case management, housing, resources for job search/government benefits    Diagnosis and Reason for Admission  You were admitted to the hospital for the following reason(s):  Right arm abscess    Your full diagnosis list is located on this After Visit Summary in the Hospital Problems section.    What Happened During Your Hospital Stay    The main tests and treatments done for you during this hospitalization were:    Drainage of abscess  IV antibiotics    The following evaluation is still important to complete after discharge from the hospital:  [facility]    Instructions for After Discharge    Your diet at home should be a regular diet.    Your activity level at home should be:  as much exercise or activity as you can tolerate.    Specific activity restrictions:    None    Wound or tube care instructions:  Wound Care Recommendations:Premedicate for dressing changes,  irrigate wound w/ saline flush, pack w/ 1/2 " iodoform gauze and cover w/ gauze and burn netting. Change daily.     Your medication list is located on this After Visit Summary in the Current Discharge Medication List section.  Your nurse will review this information with you before you leave the  hospital.    It is very important for you to keep a current medication list with you in order to assist your doctors with your medical care.  Bring this After Visit Summary with you to your follow up appointments.    Reasons to Contact a Doctor Urgently    Call 911 or return to the hospital immediately if:    If you have any of the following:  - coughing up large amounts of blood  - severe shortness of breath  - severe chest pain  - severe abdominal pain  - severe vomiting  - vomiting of blood  - black, tarry, or bloody stools  - inability to urinate  - severe headache  - loss of consciousness  - seizure  - new difficulty speaking  - new face weakness  - new arm weakness  - new leg weakness    If you have any questions about your hospital care, your medications, or if you have new or concerning symptoms soon after going home from the hospital, and you need to contact your hospital physician, your hospital physician can be contacted in the following manner:  [facility] operator at [phone].    Once you are able to see your primary care physician (PCP), your PCP will then be responsible for further medication refills, or appointment referrals.    What Needs to Happen Next After Discharge -- Appointments and Follow Up    Any  appointments already scheduled at [place] clinics will be listed in the Future Appointments section at the top of this After Visit Summary.  Any appointments that have been requested, but have not yet been scheduled, will be listed below that under Post Discharge Referrals.    Sometimes tests performed in the hospital do not yet have results by the time a patient goes home.  The following key tests will need to be followed up at your next appointment: Sensitivities of the bacteria from the cultures of your wound    Medical Home Information    Your primary care provider or clinic currently on file at [facility] is: No Pcp, Per Patient    Handouts Given to You (if applicable)

## 2016-06-15 NOTE — Progress Notes (Addendum)
Pharmacist HIV Discharge Medication Reconciliation    Lawrence Sanders  [id number]  [id number]    Date performed: 06/15/2016    After Visit Summary (AVS) medication list was reviewed by pharmacist.     No findings upon review.    Medication changes from admission:   New medications:    Augmentin 875/125mg  po q12h x 14 days for abscess   Tamsulosin 0.4mg  po daily w/ food  Dose adjustments:    Quetiapine 600mg ->> 400mg  po qhs   Sertraline 100mg ->> 50mg  po daily  Discontinued medications:    none    HIV Regimen: CD4 399/22%, VL 1,440,000, Date 06/12/16   Antiretrovirals:   Triumeq 600-50-300mg  po daily   OI Prophylaxis:    Not indicated    [facility] [address]  [facility]    We will follow up with patient in clinic on 11/21, no phone number for f/u call.     Follow-up issues to be addressed in clinic and/or via post discharge phone call:    Confirm [facility] as [facility] still has [name].  See [name] notes regarding insurance change over to [name].    Lawrence BumpersShannon Tinamarie Sanders, PharmD, BCPS, AAHIVP  Clinical Pharmacist - Transitions of Care, HIV, HF  Pager [phone]

## 2016-06-15 NOTE — Interdisciplinary (Addendum)
06/15/16 1029   Assessment   Assessment Type Initial   Attended Daily Huddle? Yes   Patient Information   Where was the [facility]   Why is Patient in the Hospital? Admitted a 55 year old male with HIV (self-reported CD4 = 399 ~1471mo ago, VL unknown) on Triumeq, HCV (untreated), active IVDA (meth), paranoid schizophrenia, and 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 Patient reports that at baseline he tries to inject meth into his arm and leg veins. Reports that 1 week ago he tried to inject into his right arm which since then has started to be edematous, warm, and erythematous.    Prior to Level of Function Ambulatory/Independent with ADL's   Assistive Device Not applicable   Primary Caretaker(s) (none provided during interview.)   Primary Contact Name none   Income Information   Income Source (disabled)   Referral To   Patent examinerinancial Resources Financial counseling.Patient states he was receiving $900.00 social security & was changed 6o $650.00 for being homeless. Scientist, forensic(Unlisted MediCal HMO non contracted [facility]([place] [employer]).)   Discharge Planning   Living Arrangements (Arrived [place] from [place] 2 weeks ago & is homeless since then.)   Support Systems Corrie Dandy(Mary Warga,mother at [phone] [facility]([place] line). States during interview that he has an appointment with [facility] in one week.)   Anticipated Supplies/Services  Too soon to be determined   Barriers to Discharge Awaiting clinical improvement   Has discharge transport been arranged? No  (Social Service referral with transportation PRN.)   Patient Has Decision Making Capacity Yes   Social Worker Consult   Do you need to see a Child psychotherapistsocial worker?  Yes   Issues to discuss with social worker?  Adjustment to illness/injury;Homeless resources;Drug/alcohol abuse   Readmission Risk Assessment   High Risk For Readmission Yes   High Risk Indicators Homelessness;Substance abuse   Notified Jerrel IvoryGabrielle H,Financial  Counselor at [phone] ([phone]) & requested to assist the patient with transferring his insurance to [facility].  Patient [place] to stay for [place] no plans to [place] [facility] [place].  SNF referral sent as recommended by Wound RN.    Noted that patient was discharged by [facility].

## 2016-06-15 NOTE — Transition Nurse Specialist (Signed)
HIV TNS Update:    TNS and TOC PharmD met with patient at bedside prior to discharge - see PharmD note from today.     Reviewed upcoming 11/21 Roxy Manns Nurse visit, but reminded pt that he will not be seeing a provider on this day due to his current coverage. Patient reports no change on insurance status. PharmD has provided pt with a print out of his [place] and the [facility] HMO number was highlighted in case pt needs to contact them to request MCAL transfer (P: [phone]). Pt still has [place] customer service number at bedside and TNS provided him with the contact address/number to the [facility] office downtown.    Regarding community resources, TNS reviewed 2-1-1 and Jerrilyn [place] Day for [place] mail box set-up. Pt denies questions at this time.      TNS to continue to assess needs and follow post-discharge as needed.  Celedonio Savage, [place], FNP-BC, [facility]

## 2016-06-15 NOTE — Plan of Care (Signed)
Problem: Infection  Goal: Absence of infection signs and symptoms  Outcome: Progressing toward goal, anticipate improvement over: next 12-24 hours  Afebrile this shift.  Patient currently transitioned to PO Amoxicillin.      Problem: Pain - Acute  Goal: Communication of presence of pain  Outcome: Progressing toward goal, anticipate improvement over: next 12-24 hours  Denies pain.  Will provide pain management as needed.

## 2016-06-15 NOTE — Plan of Care (Signed)
Problem: Infection  Goal: Absence of infection signs and symptoms    Change when soiled, integrity impaired, physican order, or, no longer than 7 days.   Outcome: Progressing toward goal, anticipate improvement over: next 12-24 hours  Pt temp 98.0 this am, afebrile, WBC 6.9, RUE incision no drainage present on dressing, dressing changed today, oral abx given, changed to Augmentin, to d/c today with oral abx, pt educated regarding completion of all abx by pharmacy, verbalized understanding.     Problem: Pain - Acute  Goal: Communication of presence of pain  Outcome: Goal Met  Pt had c/o pain on RUE during shift, given PRN pain meds, pt reassessment pt sleeping, continue to monitor for pain.     Problem: Skin Integrity Impaired: Secondary to pressure ulcer(admission or hospital acquired), cellulitis, incontinence, surgical wound/incision, chronic wound or  skin disease  Goal: Prevent further skin compromise  Outcome: Goal Met  Pt ambulatory and able to position self in bed, encouraged repositioning, dressing changed for RUE.   Goal: Absence of infection signs and symptoms    Change when soiled, integrity impaired, physican order, or, no longer than 7 days.   Outcome: Progressing toward goal, anticipate improvement over: next 12-24 hours  Pt temp 98.0 this am, afebrile, WBC 6.9, RUE incision no drainage present on dressing, dressing changed today, oral abx given, changed to Augmentin, to d/c today with oral abx, pt educated regarding completion of all abx by pharmacy, verbalized understanding.

## 2016-06-15 NOTE — Discharge Summary (Addendum)
Date of Admission: 06/10/2016  Date of Discharge: 06/15/2016    Patient Name:  Lawrence Sanders    Principal Diagnosis (required):  Right arm abscess    Hospital Problem List (required):  Active Hospital Problems    Diagnosis   . Tobacco abuse [Z72.0]      Resolved Hospital Problems    Diagnosis   . Abscess [L02.91]   . Cellulitis [L03.90]       Additional Hospital Diagnoses ("rule out" or "suspected" diagnoses, etc.):  HIV   HCV  Paranoid Schizophrenia     Principal Procedure Performed During This Hospitalization (required):  Bedside incision and drainage in ED    Other Procedures Performed During This Hospitalization (required):  Ultrasound of right upper extremity    Procedure results are available in Chart Review in Epic.  For those providers external to [place], the key procedure results are listed below:  U/S  FINDINGS:  Targeted ultrasound was performed along the right forearm with grayscale and color Doppler. Images were also obtained of the left forearm for comparison.  There is no evidence of fluid collection or hematoma on either side. Skin thickening and edema is noted on the right side but not on the left.  IMPRESSION:  Skin thickening and edema noted along the right forearm but no evidence of abscess, fluid collection, or hematoma.        FINDINGS:  Duplex Doppler imaging was performed with visualization of the internal jugular, subclavian, and axillary veins on the right and the left subclavian vein. There is no evidence of intraluminal thrombus. Normal spectral waveforms are noted with respiratory variation. Color flow is normal.  In addition, targeted ultrasound was performed along the right anterior upper arm, 3 cm superior to the elbow. In this region, the muscle and subcutaneous tissues are edematous. Linear collection of gas is noted consistent with recent debridement. No fluid collection or hematoma seen.  IMPRESSION:  No evidence of thrombus within the examined deep veins in the examined  veins.  Significant edema noted along the right anterior upper arm. No evidence of abscess. Air is consistent with recent debridement. If there is continued concern for additional abscess collections in the arm, please consider CT with contrast for more sensitive evaluation.    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 Patient reports that at baseline he tries to inject meth into his arm and leg veins. Reports that 1 week ago he tried to inject into his right arm which since then has started to be edematous, warm, and erythematous. Denies loss of feeling in right hand/numbness/tingling/difficulty moving fingers. Denies fever/chills/ns.   Otherwise on ROS reports difficulty with initiating urination with poor stream. Chronic. Reports also burning with urination. Reports that this is chronic but also reports that he thinks this related to meth. Denies any penile lesions or discharges  Denies ab pain n/v. Reports some constipation. No diarrhea. No melena/hematochezia.   No other lesions on skin.  No headache. 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   No dysphagia/odynophagia.   20lb  wt loss in a few weeks. No cough. No fever/chills/night sweats.     HIV hx - reports dx 1992, risk factor MSM, reports at that time CD 4 low at 5518?, reports in 1996 had episode of PJP pneumonia and possibly TB. Denies hx of other OIs. Reports being on ARV since ~1996. Reports possibly previously on Truvada. On Triumeq for about 1 year. Reports missed doses over the past few  months, maybe once a week, but also reports having missed maybe a week over the past month. Otherwise reports also hx of syphilis and other STIs. Not currently sexually active.      HCV - reports dx in the early 2000s, treatment-naiive. Denies ever been told of cirrhosis or other complications.     Patient reports that he was living in [place] for the past month, [place] moved [place]. [place] - that's where he got his supply of Triumeq from. Prior to that living in [place] for 2 yrs. Currently would like to stay in [place] until end of the month and move back to [place] afterwards. Pt has not been seen by anyone here for HIV.     ED:  IV vanc, I&D     Hospital Course by Problem (required):    #RUE SSTI with Abscess in the setting of IVDA  SSTI with abscess on presentation s/p I&D by ED - very purulent per ED. Neurovascularly intact. Low suspicion for nec fasc today. Low suspicion for joint involvement. No DVT. No abscess in distal arm. Clinically significantly improved. Culture grew Strep spp and Eikenella. Initially on IV Abx then transitioned to PO. Discharge on Augmentin BID for 14 days. Discharged with wound care supplies. [facility]. Pt also has an appointment with Cornelius Moraswen within 1 week    #HIV, CD 4 - 399, VL - 31 this admission  Renewed medications. Est care with Cornelius Moraswen within 1 week. Continue Triumeq on discharge.     #HCV, VL > 1million  Immune to hep A/B  - outpt f/u with Cornelius Moraswen for possible tx    #LUTS  No UTI.   - tamsulosin on dc    #IVDA   No murmurs on exam or other stigmata of endocarditis. No fever/leukocytosis. Follow-up on BCx.  - UTox   - SW consult    #Paranoid Schizophrenia  Did not tolerate 600mg  dose of Seroquel. Per patient, at home when he missed doses of Seroquel and suddenly resumed 600mg  dose, would feel lightheaded. This happened Sat evening. Pt reports that at home he self titrates Seroquel, starting from 300mg  QHS.  Tolerated 300mg  dose.  - home Sertraline 50mg  QHS  - seroquel 400mg  QHS on discharge  - outpt psych referral placed    Tests Outstanding at Discharge Requiring Follow Up:  None    Discharge Condition (required):  Stable.    Key Physical Exam Findings at Discharge:  Physical examination is significant for:  see below.  General: NAD, AOx3, cachectic  CV: RRR, S1/S2 normal, no murmurs/r/g  Lungs: no increased WOB, CTA bilaterally   Abdomen: Soft, non-distended, nontender  Extremities: bil LE WWP no edema  RUE proximal to elbow s/p I&D with packed wound without drainage, significantly improved in edema/warmth/tenderness, elbow minimal edema with full ROM, distal to elbow minimal edema/warmth/erythema   Skin: No jaundice  Neuro: oral tardive dyskinesia, otherwise non-focal  Psych: Normal mood and affect  Discharge Diet:  Regular.    Discharge Medications:     What To Do With Your Medications      START taking these medications       Add'l Info    amoxicillin-clavulanate 875-125 MG tablet   Commonly known as:  AUGMENTIN   Take 1 tablet by mouth every 12 hours for 14 days Indications: Skin / Soft Tissue Infection.    Quantity:  28 tablet   Refills:  0       tamsulosin 0.4 MG capsule   Commonly known as:  FLOMAX   Take 1 capsule (0.4 mg) by mouth daily (with food).   Start taking on:  06/16/2016    Quantity:  30 capsule   Refills:  3         CHANGE how you take these medications       Add'l Info    quetiapine 400 MG tablet   Commonly known as:  SEROQUEL   Take 1 tablet (400 mg) by mouth at bedtime.    Quantity:  60 tablet   Refills:  3   What changed:    - medication strength  - how much to take       sertraline 50 MG tablet   Commonly known as:  ZOLOFT   Take 1 tablet (50 mg) by mouth every evening.    Quantity:  30 tablet   Refills:  0   What changed:    - how much to take  - when to take this         CONTINUE taking these medications       Add'l Info    abacavir-dolutegravir-lamivudine 600-50-300 MG Tabs   Commonly  known as:  TRIUMEQ   Take 1 tablet by mouth daily.   Start taking on:  06/16/2016    Quantity:  30 tablet   Refills:  3            Where to Get Your Medications      These medications were sent to [id number] - [place], [place] - [address]  [address], [place] [address]    Hours:  Mon-Tues 8am-7pm, Wed 7am-7pm, Thurs-Fri 8am-7pm, Sat 9am-6pm, Sun 10am-4pm Phone:  [phone]    . abacavir-dolutegravir-lamivudine 600-50-300 MG Tabs   . amoxicillin-clavulanate 875-125 MG tablet   . quetiapine 400 MG tablet   . sertraline 50 MG tablet   . tamsulosin 0.4 MG capsule             Allergies:  No Known Allergies    Discharge Disposition:  Home.    Discharge Code Status:  Do Not Attempt Resuscitation / full care  This code status is not changed from the time of admission.    Follow Up Appointments:    Scheduled appointments:  Future Appointments  Date Time Provider Department Center   06/21/2016 1:00 PM Nurse, Intake MOS [facility]       For appointments requested for after discharge that have not yet been scheduled, refer to the Post Discharge Referrals section of the After Visit Summary.    Discharging Physician's Contact Information:  [facility] operator at [phone].

## 2016-06-16 LAB — HEPATITIS C GENOTYPING, BLOOD: Hepatitis C Genotyping: 1 — AB

## 2016-06-17 LAB — BLOOD CULTURE: Blood Culture Result: NO GROWTH

## 2016-06-21 ENCOUNTER — Ambulatory Visit (HOSPITAL_BASED_OUTPATIENT_CLINIC_OR_DEPARTMENT_OTHER): Payer: MEDICAID

## 2016-06-22 ENCOUNTER — Encounter (HOSPITAL_BASED_OUTPATIENT_CLINIC_OR_DEPARTMENT_OTHER): Payer: Self-pay

## 2016-10-04 LAB — LIVER FIBROSIS, FIBRO/ACTITEST
Liver Fibr. ALT: 29 U/L (ref 9–46)
Liver Fibr. Alpha-2-Macroglob: 296 mg/dL — ABNORMAL HIGH (ref 106–279)
Liver Fibr. Apolipoprotein A-1: 132 mg/dL (ref 94–176)
Liver Fibr. Fibrosis Score: 0.43
Liver Fibr. GGT: 35 U/L (ref 3–95)
Liver Fibr. Haptoglobulin: 113 mg/dL (ref 43–212)
Liver Fibr. Necroinf Act Score: 0.17
Liver Fibr. Reference ID: 1335991
Liver Fibr. Total Bilirubin: 0.4 mg/dL (ref 0.2–1.2)

## 2016-10-05 ENCOUNTER — Telehealth (HOSPITAL_BASED_OUTPATIENT_CLINIC_OR_DEPARTMENT_OTHER): Payer: Self-pay | Admitting: Infectious Disease

## 2016-10-05 NOTE — Telephone Encounter (Signed)
Attempted to call patient but no phone number on file. Will send an Careers adviseroutreach letter. Notified patient's Child psychotherapistsocial worker, [place].

## 2016-10-05 NOTE — Telephone Encounter (Signed)
-----   Message from Hyman Bowerachel A Bender Ignacio, MD sent at 10/04/2016  8:21 PM PST -----  Regarding: schedule f/u  I haven't seen him in some time, can we please book first available?  Thanks  RBI  ----- Message -----  From: Christain SacramentoUw Lab Results Interface, User  Sent: 10/04/2016   8:39 AM  To: Hyman Bowerachel A Bender Ignacio, MD

## 2016-12-10 ENCOUNTER — Inpatient Hospital Stay: Payer: Self-pay

## 2017-02-19 ENCOUNTER — Inpatient Hospital Stay: Payer: Self-pay

## 2017-06-26 ENCOUNTER — Ambulatory Visit: Payer: Self-pay

## 2017-06-26 ENCOUNTER — Other Ambulatory Visit: Payer: Self-pay

## 2017-07-10 ENCOUNTER — Ambulatory Visit: Payer: Self-pay | Admitting: Infectious Disease

## 2017-07-10 ENCOUNTER — Ambulatory Visit: Payer: Self-pay

## 2017-09-07 DIAGNOSIS — E1165 Type 2 diabetes mellitus with hyperglycemia: Secondary | ICD-10-CM

## 2017-09-07 DIAGNOSIS — F209 Schizophrenia, unspecified: Secondary | ICD-10-CM | POA: Insufficient documentation

## 2017-09-07 DIAGNOSIS — R809 Proteinuria, unspecified: Secondary | ICD-10-CM

## 2017-09-07 DIAGNOSIS — R32 Unspecified urinary incontinence: Secondary | ICD-10-CM | POA: Insufficient documentation

## 2017-09-07 DIAGNOSIS — Z794 Long term (current) use of insulin: Secondary | ICD-10-CM

## 2017-09-07 DIAGNOSIS — E1129 Type 2 diabetes mellitus with other diabetic kidney complication: Secondary | ICD-10-CM | POA: Insufficient documentation

## 2017-09-07 DIAGNOSIS — E118 Type 2 diabetes mellitus with unspecified complications: Secondary | ICD-10-CM | POA: Insufficient documentation

## 2017-09-13 ENCOUNTER — Other Ambulatory Visit (HOSPITAL_COMMUNITY)
Admission: RE | Admit: 2017-09-13 | Discharge: 2017-09-13 | Disposition: A | Discharge status: Home / Self Care | Payer: Medicaid Other | Source: Ambulatory Visit | Attending: Family | Admitting: Family

## 2017-09-13 ENCOUNTER — Ambulatory Visit: Payer: Medicaid Other

## 2017-09-13 ENCOUNTER — Other Ambulatory Visit: Payer: Medicaid Other

## 2017-09-13 DIAGNOSIS — Z79899 Other long term (current) drug therapy: Secondary | ICD-10-CM

## 2017-09-13 DIAGNOSIS — Z113 Encounter for screening for infections with a predominantly sexual mode of transmission: Secondary | ICD-10-CM | POA: Insufficient documentation

## 2017-09-13 DIAGNOSIS — B20 Human immunodeficiency virus [HIV] disease: Secondary | ICD-10-CM

## 2017-09-14 LAB — URINALYSIS
Bilirubin Urine: NEGATIVE
Hgb urine dipstick: NEGATIVE
KETONES UR: NEGATIVE
LEUKOCYTES UA: NEGATIVE
NITRITE: NEGATIVE
PROTEIN: NEGATIVE
Specific Gravity, Urine: 1.037 — ABNORMAL HIGH (ref 1.001–1.03)
pH: 6 (ref 5.0–8.0)

## 2017-09-14 LAB — URINE CYTOLOGY ANCILLARY ONLY
CHLAMYDIA, DNA PROBE: NEGATIVE
NEISSERIA GONORRHEA: NEGATIVE

## 2017-09-14 LAB — T-HELPER CELL (CD4) - (RCID CLINIC ONLY)
CD4 T CELL HELPER: 13 % — AB (ref 33–55)
CD4 T Cell Abs: 370 /uL — ABNORMAL LOW (ref 400–2700)

## 2017-09-15 LAB — QUANTIFERON-TB GOLD PLUS
Mitogen-NIL: 3.1 IU/mL
NIL: 0.06 IU/mL
QUANTIFERON-TB GOLD PLUS: NEGATIVE
TB1-NIL: 0.01 [IU]/mL

## 2017-09-19 LAB — CBC WITH DIFFERENTIAL/PLATELET
BASOS ABS: 21 {cells}/uL (ref 0–200)
Basophils Relative: 0.4 %
EOS ABS: 31 {cells}/uL (ref 15–500)
EOS PCT: 0.6 %
HEMATOCRIT: 44.5 % (ref 38.5–50.0)
HEMOGLOBIN: 14.3 g/dL (ref 13.2–17.1)
LYMPHS ABS: 2647 {cells}/uL (ref 850–3900)
MCH: 27.8 pg (ref 27.0–33.0)
MCHC: 32.1 g/dL (ref 32.0–36.0)
MCV: 86.6 fL (ref 80.0–100.0)
MPV: 11.9 fL (ref 7.5–12.5)
Monocytes Relative: 8.9 %
NEUTROS PCT: 39.2 %
Neutro Abs: 2038 cells/uL (ref 1500–7800)
Platelets: 211 10*3/uL (ref 140–400)
RBC: 5.14 10*6/uL (ref 4.20–5.80)
RDW: 12.6 % (ref 11.0–15.0)
Total Lymphocyte: 50.9 %
WBC mixed population: 463 cells/uL (ref 200–950)
WBC: 5.2 10*3/uL (ref 3.8–10.8)

## 2017-09-19 LAB — COMPLETE METABOLIC PANEL WITH GFR
AG RATIO: 0.7 (calc) — AB (ref 1.0–2.5)
ALT: 34 U/L (ref 9–46)
AST: 32 U/L (ref 10–35)
Albumin: 3.8 g/dL (ref 3.6–5.1)
Alkaline phosphatase (APISO): 95 U/L (ref 40–115)
BILIRUBIN TOTAL: 0.6 mg/dL (ref 0.2–1.2)
BUN: 16 mg/dL (ref 7–25)
CALCIUM: 9.4 mg/dL (ref 8.6–10.3)
CHLORIDE: 99 mmol/L (ref 98–110)
CO2: 25 mmol/L (ref 20–32)
Creat: 0.86 mg/dL (ref 0.70–1.33)
GFR, EST NON AFRICAN AMERICAN: 98 mL/min/{1.73_m2} (ref >=60)
GFR, Est African American: 113 mL/min/{1.73_m2} (ref >=60)
GLUCOSE: 424 mg/dL — AB (ref 65–99)
Globulin: 5.7 g/dL (calc) — ABNORMAL HIGH (ref 1.9–3.7)
Potassium: 4.7 mmol/L (ref 3.5–5.3)
Sodium: 128 mmol/L — ABNORMAL LOW (ref 135–146)
TOTAL PROTEIN: 9.5 g/dL — AB (ref 6.1–8.1)

## 2017-09-19 LAB — HEPATITIS A ANTIBODY, TOTAL: HEPATITIS A AB,TOTAL: REACTIVE — AB

## 2017-09-19 LAB — HEPATITIS B SURFACE ANTIGEN: Hepatitis B Surface Ag: NONREACTIVE

## 2017-09-19 LAB — LIPID PANEL
Cholesterol: 173 mg/dL (ref <200)
HDL: 29 mg/dL — ABNORMAL LOW (ref >40)
LDL Cholesterol (Calc): 123 mg/dL (calc) — ABNORMAL HIGH
NON-HDL CHOLESTEROL (CALC): 144 mg/dL — AB (ref <130)
Total CHOL/HDL Ratio: 6 (calc) — ABNORMAL HIGH (ref <5.0)
Triglycerides: 100 mg/dL (ref <150)

## 2017-09-19 LAB — HEPATITIS C ANTIBODY
Hepatitis C Ab: REACTIVE — AB
SIGNAL TO CUT-OFF: 26.9 — ABNORMAL HIGH (ref <1.00)

## 2017-09-19 LAB — HEPATITIS B SURFACE ANTIBODY,QUALITATIVE: Hep B S Ab: NONREACTIVE

## 2017-09-19 LAB — HLA B*5701: HLA-B 5701 W/RFLX HLA-B HIGH: NEGATIVE

## 2017-09-19 LAB — HCV RNA,QUANTITATIVE REAL TIME PCR
HCV QUANT LOG: 6.35 {Log_IU}/mL — AB
HCV RNA, PCR, QN: 2220000 IU/mL — ABNORMAL HIGH

## 2017-09-19 LAB — RPR: RPR: NONREACTIVE

## 2017-09-19 LAB — HEPATITIS B CORE ANTIBODY, TOTAL: Hep B Core Total Ab: REACTIVE — AB

## 2017-09-26 LAB — HIV-1 RNA ULTRAQUANT REFLEX TO GENTYP+
HIV 1 RNA Quant: 148000 Copies/mL — ABNORMAL HIGH
HIV-1 RNA Quant, Log: 5.17 Log cps/mL — ABNORMAL HIGH

## 2017-09-26 LAB — RFLX HIV-1 INTEGRASE GENOTYPE: HIV-1 Genotype: DETECTED — AB

## 2017-09-27 ENCOUNTER — Ambulatory Visit (INDEPENDENT_AMBULATORY_CARE_PROVIDER_SITE_OTHER): Payer: Medicaid Other | Admitting: Family

## 2017-09-27 ENCOUNTER — Encounter: Payer: Self-pay | Admitting: Family

## 2017-09-27 ENCOUNTER — Ambulatory Visit (INDEPENDENT_AMBULATORY_CARE_PROVIDER_SITE_OTHER): Payer: Medicaid Other | Admitting: Pharmacist Clinician (PhC)/ Clinical Pharmacy Specialist

## 2017-09-27 VITALS — BP 115/73 | HR 94 | Temp 98.4°F | Ht 74.0 in | Wt 150.0 lb

## 2017-09-27 DIAGNOSIS — B20 Human immunodeficiency virus [HIV] disease: Secondary | ICD-10-CM | POA: Diagnosis not present

## 2017-09-27 DIAGNOSIS — Z21 Asymptomatic human immunodeficiency virus [HIV] infection status: Secondary | ICD-10-CM | POA: Insufficient documentation

## 2017-09-27 DIAGNOSIS — B182 Chronic viral hepatitis C: Secondary | ICD-10-CM | POA: Diagnosis not present

## 2017-09-27 DIAGNOSIS — J0100 Acute maxillary sinusitis, unspecified: Secondary | ICD-10-CM | POA: Insufficient documentation

## 2017-09-27 MED ORDER — AMOXICILLIN-POT CLAVULANATE 875-125 MG PO TABS: 1.0000 | ORAL_TABLET | Freq: Two times a day (BID) | ORAL | 0 refills | Status: DC

## 2017-09-27 NOTE — Assessment & Plan Note (Signed)
Symptoms and exam consistent with acute bacterial sinusitis refractory to ciprofloxacin. Start Augmentin. Continue over-the-counter medications as needed for symptom relief and supportive care. Follow-up if symptoms worsen or do not improve. Consider referral to ear nose and throat if not better with antibiotics.

## 2017-09-27 NOTE — Progress Notes (Signed)
HPI: Derrick Cooper is a 57 y.o. male who is here to see Derrick Cooper for his hep C and HIV.   No results found for: HCVGENOTYPE, HEPCGENOTYPE  Allergies: No Known Allergies  Vitals: Temp: 98.4 F (36.9 C) (02/27 1337) Temp Source: Oral (02/27 1337) BP: 115/73 (02/27 1337) Pulse Rate: 94 (02/27 1337)  Past Medical History: Past Medical History:  Diagnosis Date  . Diabetes mellitus without complication (HCC)   . Hepatitis C   . HIV infection (HCC)   . Schizophrenia (HCC)   . Substance abuse (HCC)     Social History: Social History   Socioeconomic History  . Marital status: Single    Spouse name: Not on file  . Number of children: 0  . Years of education: 9  . Highest education level: Not on file  Social Needs  . Financial resource strain: Not hard at all  . Food insecurity - worry: Never true  . Food insecurity - inability: Never true  . Transportation needs - medical: Yes  . Transportation needs - non-medical: Not on file  Occupational History  . Not on file  Tobacco Use  . Smoking status: Current Every Day Smoker    Packs/day: 0.50    Years: 40.00    Pack years: 20.00    Types: Cigarettes  . Smokeless tobacco: Never Used  Substance and Sexual Activity  . Alcohol use: No    Frequency: Never  . Drug use: Yes    Types: Marijuana, Methamphetamines    Comment: has not used meth since 03/2017, marijuana once or twice a month  . Sexual activity: No    Comment: declined condoms  Other Topics Concern  . Not on file  Social History Narrative  . Not on file    Labs: HIV 1 RNA Quant (Copies/mL)  Date Value  09/13/2017 148,000 (H)   CD4 T Cell Abs (/uL)  Date Value  09/13/2017 370 (L)   Hep B S Ab (no units)  Date Value  09/13/2017 NON-REACTIVE   Hepatitis B Surface Ag (no units)  Date Value  09/13/2017 NON-REACTIVE    No results found for: HCVGENOTYPE, HEPCGENOTYPE  Hepatitis C RNA quantitative Latest Ref Rng & Units 09/13/2017  HCV Quantitative Log NOT  DETECT Log IU/mL 6.35(H)    AST (U/L)  Date Value  09/13/2017 32   ALT (U/L)  Date Value  09/13/2017 34    CrCl: Estimated Creatinine Clearance: 93.3 mL/min (by C-G formula based on SCr of 0.86 mg/dL).  Fibrosis Score: Pending  Child-Pugh Score: Pending  Previous Treatment Regimen: None  Assessment: Derrick Cooper is here to establish care with Derrick Cooper for his hep C and HIV. His hep C was confirmed with the recent VL. He has been off of therapy for is HIV side. We plan to restart his anti-retroviral with Biktarvy due to the anticipation of treating his hep C to avoid interactions. He has medicaid so got him to sign the readiness form. Once we get his labs and US result back, we will determine his therapy at that point.   Recommendations:  Hep C labs to determine treatment Start Biktarvy 1 PO qday  Derrick Cooper, [address] Rainbow BoulevardPharm.D., BCPS, AAHIVP Clinical Infectious Disease Pharmacist [facility] 09/27/2017, 3:26 PM

## 2017-09-27 NOTE — Patient Instructions (Addendum)
Nice to meet you!  We will get you started on BIKTARVY.  The goal will also to be to treat your Hepatitis C as well once your HIV is well controlled.  We will plan to follow up in 1 month or sooner if needed. Blood work can be done just before the appointment or the day of.   We will get you set up for an ultrasound of your liver for treatment of Hepatitis C.   Please start taking the Augmentin for your sinuses. If they worsen we will consider sending you to ENT.

## 2017-09-27 NOTE — Assessment & Plan Note (Signed)
[name] believes he may have contracted hepatitis C in 1992 at the same time as his HIV. He has  not sought treatment for this in the past. He has a current viral load of 2 main 220,000. He is immune to hepatitis A but not hepatitis B. Will obtain ultrasound elastography, fibro-test, INR/PT, hepatic function, and hepatitis C genotype. Treatment will be depending upon testing and imaging results. Risks, benefits, and alternatives were discussed and patient wishes to pursue treatment pending lab work results.

## 2017-09-27 NOTE — Assessment & Plan Note (Signed)
[name] believes he required HIV in 1992 possibly from sexual contact or sharing of needles. Previously on multiple medications including AZT, Viracept, Truvada, and Triumeq. He has been off medication for 2 months. His most recent viral load is 140,000 and CD4 count of 370. He tested negative for syphilis and gonorrhea/chlamydia. No current symptoms of opportunistic infection. Declines condoms. Plan to start Biktarvy for new ART regimen. Follow up in 1 month or sooner if needed. Will ensure immunizations and financial assistance are up to date.
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
## 2017-10-06 ENCOUNTER — Encounter: Payer: Self-pay | Admitting: *Deleted

## 2017-10-06 ENCOUNTER — Telehealth: Payer: Self-pay | Admitting: Behavioral Health

## 2017-10-06 DIAGNOSIS — B20 Human immunodeficiency virus [HIV] disease: Secondary | ICD-10-CM

## 2017-10-06 MED ORDER — BICTEGRAVIR-EMTRICITAB-TENOFOV 50-200-25 MG PO TABS: 1.0000 | ORAL_TABLET | Freq: Every day | ORAL | 2 refills | Status: DC

## 2017-10-06 NOTE — Telephone Encounter (Signed)
Patient called stating the Pharmacy still had not received his prescription for Biktarvy.  Medication verified in Wess BottsGreg Calone's Notes and Pharmacy notes.  Will send to Pharmacy [facility] on [address].  Patient to follow-up 10/26/2017. Angeline SlimAshley Hill RN

## 2017-10-25 DIAGNOSIS — Z794 Long term (current) use of insulin: Secondary | ICD-10-CM

## 2017-10-25 DIAGNOSIS — IMO0002 Reserved for concepts with insufficient information to code with codable children: Secondary | ICD-10-CM | POA: Insufficient documentation

## 2017-10-25 DIAGNOSIS — E785 Hyperlipidemia, unspecified: Secondary | ICD-10-CM

## 2017-10-25 DIAGNOSIS — E1165 Type 2 diabetes mellitus with hyperglycemia: Secondary | ICD-10-CM

## 2017-10-25 DIAGNOSIS — E11610 Type 2 diabetes mellitus with diabetic neuropathic arthropathy: Secondary | ICD-10-CM | POA: Insufficient documentation

## 2017-10-25 DIAGNOSIS — E1169 Type 2 diabetes mellitus with other specified complication: Secondary | ICD-10-CM | POA: Insufficient documentation

## 2017-10-26 ENCOUNTER — Encounter: Payer: Self-pay | Admitting: Family

## 2017-10-26 ENCOUNTER — Ambulatory Visit (INDEPENDENT_AMBULATORY_CARE_PROVIDER_SITE_OTHER): Payer: Medicaid Other | Admitting: Family

## 2017-10-26 VITALS — BP 115/75 | HR 85 | Temp 98.3°F | Ht 74.0 in | Wt 149.0 lb

## 2017-10-26 DIAGNOSIS — B182 Chronic viral hepatitis C: Secondary | ICD-10-CM | POA: Diagnosis not present

## 2017-10-26 DIAGNOSIS — B20 Human immunodeficiency virus [HIV] disease: Secondary | ICD-10-CM | POA: Diagnosis present

## 2017-10-26 NOTE — Patient Instructions (Signed)
Nice to see you!  Continue to take your CrenshawBiktarvy as prescribed.  We will get you scheduled for the ultrasound.   We will check your blood work today for HIV and Hepatitis C testing.  Plan to follow up in 3 months or possibly sooner to discuss test results and forms.

## 2017-10-26 NOTE — Assessment & Plan Note (Signed)
No acute symptoms present on exam. Obtain elastography, genotype, and Fibrosure. Will likely need Mavyret for treatment. Advised not to share toothbrushes or razors. Follow pending lab work results.

## 2017-10-26 NOTE — Assessment & Plan Note (Signed)
Tolerating Biktarvy with no adverse side effects. Recheck CD4 and viral load today. No evidence of opportunistic infections noted. Declines condoms. Indicates his last dose pneumococcal vaccination was in September 2018. Continue Biktarvy with follow up in 3 months or sooner pending blood work results.

## 2017-10-26 NOTE — Progress Notes (Signed)
Subjective:    Patient ID: Derrick Cooper, male    DOB: 06/26/1961, 57 y.o.   MRN: [id number]  Chief Complaint  Patient presents with  . HIV Positive/AIDS  . Hepatitis C     HPI:  Derrick Cooper is a 57 y.o. male who presents today for a follow up office visit related to his HIV disease and Hepatitis C.   HIV - Recently started on Biktarvy. Reports taking the medication as prescribed and denies adverse side effects and no reported missing doses. He has no problems affording or obtaining his medications. His last pneumonia vaccination was in September. Denies fevers, chills, night sweats, neck pain/stiffness, headaches, changes in vision, nausea, vomiting, diarrhea, rashes or lesions.   Hepatitis C - Previously noted to have Hepatitis C with a viral load of 2.2 million. Denies any nausea, vomiting, diarrhea, changes in stool, or abdominal pain.   Immunization History  Administered Date(s) Administered  . Influenza-Unspecified 07/01/2017   No Known Allergies    Outpatient Medications Prior to Visit  Medication Sig Dispense Refill  . ACCU-CHEK AVIVA PLUS test strip 3 (three) times daily. as directed  1  . ACCU-CHEK SOFTCLIX LANCETS lancets 3 (three) times daily. as directed  1  . atorvastatin (LIPITOR) 20 MG tablet Take 20 mg by mouth at bedtime.  0  . bictegravir-emtricitabine-tenofovir AF (BIKTARVY) 50-200-25 MG TABS tablet Take 1 tablet by mouth daily. 30 tablet 2  . Blood Glucose Monitoring Suppl (ACCU-CHEK AVIVA PLUS) w/Device KIT 3 (three) times daily. as directed  0  . HUMALOG KWIKPEN 100 UNIT/ML KiwkPen inject 15 units subcutaneously three times a day before meals  0  . Insulin Pen Needle (PEN NEEDLES 3/16") 31G X 5 MM MISC by Does not apply route.    Marland Kitchen LANTUS SOLOSTAR 100 UNIT/ML Solostar Pen inject 60 units subcutaneously twice a day  0  . B-D UF III MINI PEN NEEDLES 31G X 5 MM MISC USE AS DIRECTED QID  1  . ciprofloxacin (CIPRO) 500 MG tablet Take 500 mg by mouth 2 (two)  times daily.  0  . metFORMIN (GLUCOPHAGE) 500 MG tablet Take by mouth.    . QUEtiapine (SEROQUEL) 300 MG tablet Take by mouth.    Marland Kitchen amoxicillin-clavulanate (AUGMENTIN) 875-125 MG tablet Take 1 tablet by mouth 2 (two) times daily. 14 tablet 0   No facility-administered medications prior to visit.      Past Medical History:  Diagnosis Date  . Diabetes mellitus without complication ([place])   . Hepatitis C   . HIV infection ([place])   . Schizophrenia ([place])   . Substance abuse ([place])      History reviewed. No pertinent surgical history.     Review of Systems  Constitutional: Negative for activity change, appetite change, chills, diaphoresis, fatigue, fever and unexpected weight change.  HENT: Negative for congestion, sinus pressure and sore throat.   Respiratory: Negative for cough, chest tightness, shortness of breath and wheezing.   Cardiovascular: Negative for chest pain and leg swelling.  Gastrointestinal: Negative for abdominal distention, abdominal pain, constipation, diarrhea, nausea and vomiting.  Genitourinary: Negative for dysuria, flank pain, frequency, genital sores, hematuria and urgency.  Neurological: Negative for weakness and headaches.  Hematological: Does not bruise/bleed easily.      Objective:    BP 115/75   Pulse 85   Temp 98.3 F (36.8 C) (Oral)   Ht '6\' 2"'  (1.88 m)   Wt 149 lb (67.6 kg)   BMI 19.13 kg/m  Nursing note and vital signs reviewed.  Physical Exam  Constitutional: He is oriented to person, place, and time. He appears well-developed and well-nourished. No distress.  HENT:  Mouth/Throat: Oropharynx is clear and moist. No oropharyngeal exudate.  Eyes:  Patch present over right eye.  Neck: Neck supple.  Cardiovascular: Normal rate, regular rhythm, normal heart sounds and intact distal pulses. Exam reveals no gallop and no friction rub.  No murmur heard. Pulmonary/Chest: Effort normal and breath sounds normal. No respiratory distress. He has no  wheezes. He has no rales. He exhibits no tenderness.  Abdominal: Soft. Bowel sounds are normal. He exhibits no distension and no mass. There is no tenderness. There is no rebound and no guarding.  Lymphadenopathy:    He has no cervical adenopathy.  Neurological: He is alert and oriented to person, place, and time.  Skin: Skin is warm and dry.  Psychiatric: He has a normal mood and affect. His behavior is normal. Judgment and thought content normal.       Assessment & Plan:   Problem List Items Addressed This Visit      Digestive   Chronic hepatitis C without hepatic coma (HCC)    No acute symptoms present on exam. Obtain elastography, genotype, and Fibrosure. Will likely need Mavyret for treatment. Advised not to share toothbrushes or razors. Follow pending lab work results.       Relevant Orders   Hepatitis C genotype   Liver Fibrosis, FibroTest-ActiTest   [place] ELASTOGRAPHY LIVER     Other   HIV disease ([place]) - Primary    Tolerating Biktarvy with no adverse side effects. Recheck CD4 and viral load today. No evidence of opportunistic infections noted. Declines condoms. Indicates his last dose pneumococcal vaccination was in September 2018. Continue Biktarvy with follow up in 3 months or sooner pending blood work results.       Relevant Orders   HIV RNA, RTPCR W/R GT (RTI, PI,INT)   T-helper cell (CD4)- ([facility] clinic only)       I have discontinued Rohil L. Sian's amoxicillin-clavulanate. I am also having him maintain his atorvastatin, ACCU-CHEK AVIVA PLUS, ACCU-CHEK AVIVA PLUS, ciprofloxacin, LANTUS SOLOSTAR, HUMALOG KWIKPEN, ACCU-CHEK SOFTCLIX LANCETS, bictegravir-emtricitabine-tenofovir AF, Pen Needles 3/16", B-D UF III MINI PEN NEEDLES, metFORMIN, and QUEtiapine.   Follow-up: Return in about 3 months (around 01/26/2018), or if symptoms worsen or fail to improve.   Terri Piedra, MSN, [facility] for Infectious Disease

## 2017-10-27 LAB — T-HELPER CELL (CD4) - (RCID CLINIC ONLY)
CD4 T CELL ABS: 520 /uL (ref 400–2700)
CD4 T CELL HELPER: 17 % — AB (ref 33–55)

## 2017-10-29 LAB — HEPATITIS C GENOTYPE

## 2017-10-30 ENCOUNTER — Ambulatory Visit (HOSPITAL_COMMUNITY)
Admission: RE | Admit: 2017-10-30 | Discharge: 2017-10-30 | Disposition: A | Discharge status: Home / Self Care | Payer: Medicaid Other | Source: Ambulatory Visit | Attending: Family | Admitting: Family

## 2017-10-30 DIAGNOSIS — B182 Chronic viral hepatitis C: Secondary | ICD-10-CM | POA: Insufficient documentation

## 2017-10-31 LAB — LIVER FIBROSIS, FIBROTEST-ACTITEST
ALT: 36 U/L (ref 9–46)
Alpha-2-Macroglobulin: 296 mg/dL — ABNORMAL HIGH (ref 106–279)
Apolipoprotein A1: 119 mg/dL (ref 94–176)
Bilirubin: 0.3 mg/dL (ref 0.2–1.2)
Fibrosis Score: 0.39
GGT: 35 U/L (ref 3–85)
Haptoglobin: 155 mg/dL (ref 43–212)
Necroinflammat ACT Score: 0.21
REFERENCE ID: 2402240

## 2017-11-03 ENCOUNTER — Telehealth: Payer: Self-pay

## 2017-11-03 LAB — HIV RNA, RTPCR W/R GT (RTI, PI,INT)
HIV 1 RNA QUANT: 72 {copies}/mL — AB
HIV-1 RNA QUANT, LOG: 1.86 {Log_copies}/mL — AB

## 2017-11-03 NOTE — Telephone Encounter (Signed)
-----   Message from Veryl SpeakGregory D Calone, FNP sent at 11/03/2017  8:27 AM EDT ----- Please inform patient that his viral load decreased from 148,000 to 72 and his CD4 count increased from 370 to 520 which are all excellent changes. Please have him schedule an office visit in 2 weeks to discuss his treatment for Hepatitis C. Otherwise follow up for HIV as scheduled in July.

## 2017-11-03 NOTE — Telephone Encounter (Signed)
Per Tammy SoursGreg NP I called pt to inform him of recent labs. Pt was happy to hear the news of his CD4 and viral  Load. Was also be able to schedule the pt an appt to f/u with our clinic to begin treatment options for hep c with Tammy SoursGreg in two weeks. Pt has an appt on 11/13/17 at 1:45. Lorenso CourierJose L Maldonado, [place]

## 2017-11-06 NOTE — Progress Notes (Signed)
Subjective:    Patient ID: Derrick Cooper, male    DOB: 20-Jun-1961, 57 y.o.   MRN: [id number]  Chief Complaint  Patient presents with  . Follow-up    boils in right axilla  . Hepatitis C     HPI:  Derrick Cooper is a 57 y.o. male who presents today for routine follow up of Hepatitis C.   1.) Hepatitis C - Derrick Cooper was last seen on 10/26/17 and completed blood work for hepatitis C. he has a genotype 1B hepatitis C with a viral load of 2.2 million.  Fibro-sure testing showed F1-F2 with minimal fibrosis with hepatic elastography showing F2-F3. He was noted to have immunity to Hepatitis A with no immunity to Hepatitis B. Denies any current nausea, vomiting, diarrhea, or ease of bleeding. He is in need of completion of his paperwork through pharmacy.   2.) Boil - This is a new problem. Associated symptom of a boil located under his right axilla has been going on for a couple of weeks and has been refractory to wiping it with alcohol to clean it. Described as painful at times depending upon his movement. Denies fevers, chills, or discharge.   No Known Allergies   Outpatient Medications Prior to Visit  Medication Sig Dispense Refill  . ACCU-CHEK AVIVA PLUS test strip 3 (three) times daily. as directed  1  . ACCU-CHEK SOFTCLIX LANCETS lancets 3 (three) times daily. as directed  1  . B-D UF III MINI PEN NEEDLES 31G X 5 MM MISC USE AS DIRECTED QID  1  . bictegravir-emtricitabine-tenofovir AF (BIKTARVY) 50-200-25 MG TABS tablet Take 1 tablet by mouth daily. 30 tablet 2  . Blood Glucose Monitoring Suppl (ACCU-CHEK AVIVA PLUS) w/Device KIT 3 (three) times daily. as directed  0  . HUMALOG KWIKPEN 100 UNIT/ML KiwkPen inject 15 units subcutaneously three times a day before meals  0  . Insulin Pen Needle (PEN NEEDLES 3/16") 31G X 5 MM MISC by Does not apply route.    Marland Kitchen LANTUS SOLOSTAR 100 UNIT/ML Solostar Pen inject 60 units subcutaneously twice a day  0  . metFORMIN (GLUCOPHAGE) 500 MG tablet Take  500 mg by mouth daily with breakfast.     . atorvastatin (LIPITOR) 20 MG tablet Take 20 mg by mouth at bedtime.  0  . lisinopril (PRINIVIL,ZESTRIL) 5 MG tablet Take 5 mg by mouth daily.  5  . QUEtiapine (SEROQUEL) 300 MG tablet Take by mouth.    Marland Kitchen atorvastatin (LIPITOR) 40 MG tablet Take 40 mg by mouth daily.  5  . ciprofloxacin (CIPRO) 500 MG tablet Take 500 mg by mouth 2 (two) times daily.  0   No facility-administered medications prior to visit.      Past Medical History:  Diagnosis Date  . Diabetes mellitus without complication ([place])   . Hepatitis C   . HIV infection ([place])   . Schizophrenia ([place])   . Substance abuse ([place])      No past surgical history on file.   Review of Systems  Constitutional: Negative for chills, diaphoresis, fatigue and fever.  Respiratory: Negative for cough, chest tightness, shortness of breath and wheezing.   Cardiovascular: Negative for chest pain.  Gastrointestinal: Negative for abdominal distention, abdominal pain, constipation, diarrhea, nausea and vomiting.  Skin: Positive for rash.  Neurological: Negative for weakness and headaches.  Hematological: Does not bruise/bleed easily.      Objective:    BP (!) 146/91   Pulse 90   Temp (!)  97.5 F (36.4 C) (Oral)   Ht '6\' 2"'  (1.88 m)   Wt 151 lb (68.5 kg)   BMI 19.39 kg/m  Nursing note and vital signs reviewed.  Physical Exam  Constitutional: He is oriented to person, place, and time. He appears well-developed. No distress.  Cardiovascular: Normal rate, regular rhythm, normal heart sounds and intact distal pulses. Exam reveals no gallop and no friction rub.  No murmur heard. Pulmonary/Chest: Effort normal and breath sounds normal. No respiratory distress. He has no wheezes. He has no rales. He exhibits no tenderness.  Abdominal: Soft. Bowel sounds are normal. He exhibits no distension, no ascites and no mass. There is no hepatosplenomegaly. There is no tenderness. There is no rebound and no  guarding.  Neurological: He is alert and oriented to person, place, and time.  Skin: Skin is warm and dry.  Right axilla with elliptical shaped boil with 3 small white areas. Firm and tender to the touch.   Psychiatric: He has a normal mood and affect. His behavior is normal. Judgment and thought content normal.       Assessment & Plan:   Problem List Items Addressed This Visit      Digestive   Chronic hepatitis C without hepatic coma (HCC) - Primary    Genotype 1b with viral load of 2.2 million. Scans show F2-F3 level of fibrosis. Declines Hepatitis B vaccination today. Will plan for 8 weeks of Mavyret. Notable interaction with Mavyret and atorvastatin which we will temporarily change to rosuvastatin while taking the medication. Asymptomatic today. Plan to follow up in 1 month with pharmacy after starting medication.       Relevant Medications   Glecaprevir-Pibrentasvir (MAVYRET) 100-40 MG TABS     Musculoskeletal and Integument   Furuncle of right axilla    Symptoms and exam are consistent with a furuncle of the right axilla. Will start 7 day course of doxycycline. Encouraged warm compresses. If remains unresolved or symptoms worsen recommend I&D.       Relevant Medications   Glecaprevir-Pibrentasvir (MAVYRET) 100-40 MG TABS       I have discontinued [name]'s atorvastatin, ciprofloxacin, and atorvastatin. I am also having him start on doxycycline, Glecaprevir-Pibrentasvir, and rosuvastatin. Additionally, I am having him maintain his ACCU-CHEK AVIVA PLUS, ACCU-CHEK AVIVA PLUS, LANTUS SOLOSTAR, HUMALOG KWIKPEN, ACCU-CHEK SOFTCLIX LANCETS, bictegravir-emtricitabine-tenofovir AF, Pen Needles 3/16", B-D UF III MINI PEN NEEDLES, metFORMIN, QUEtiapine, and lisinopril.   Meds ordered this encounter  Medications  . doxycycline (VIBRA-TABS) 100 MG tablet    Sig: Take 1 tablet (100 mg total) by mouth 2 (two) times daily.    Dispense:  14 tablet    Refill:  0    Order Specific  Question:   Supervising Provider    Answer:   Carlyle Basques [[id number]]  . Glecaprevir-Pibrentasvir (MAVYRET) 100-40 MG TABS    Sig: Take 3 tablets by mouth daily.    Dispense:  84 tablet    Refill:  1    Order Specific Question:   Supervising Provider    Answer:   Carlyle Basques [[id number]]  . rosuvastatin (CRESTOR) 5 MG tablet    Sig: Take 1 tablet (5 mg total) by mouth daily.    Dispense:  30 tablet    Refill:  1    Temporary change to Lipitor secondary to cross reaction with Mavyret for Hep C    Order Specific Question:   Supervising Provider    Answer:   Carlyle Basques [phone]  Follow-up: 1 month with pharmacy after starting medication.   Terri Piedra, MSN, [facility] for Infectious Disease

## 2017-11-13 ENCOUNTER — Other Ambulatory Visit: Payer: Self-pay

## 2017-11-13 ENCOUNTER — Ambulatory Visit (INDEPENDENT_AMBULATORY_CARE_PROVIDER_SITE_OTHER): Payer: Medicaid Other | Admitting: Family

## 2017-11-13 ENCOUNTER — Encounter: Payer: Self-pay | Admitting: Family

## 2017-11-13 ENCOUNTER — Ambulatory Visit (INDEPENDENT_AMBULATORY_CARE_PROVIDER_SITE_OTHER): Payer: Medicaid Other | Admitting: Licensed Clinical Social Worker

## 2017-11-13 VITALS — BP 146/91 | HR 90 | Temp 97.5°F | Ht 74.0 in | Wt 151.0 lb

## 2017-11-13 DIAGNOSIS — L02421 Furuncle of right axilla: Secondary | ICD-10-CM | POA: Insufficient documentation

## 2017-11-13 DIAGNOSIS — F200 Paranoid schizophrenia: Secondary | ICD-10-CM

## 2017-11-13 DIAGNOSIS — B182 Chronic viral hepatitis C: Secondary | ICD-10-CM

## 2017-11-13 MED ORDER — DOXYCYCLINE HYCLATE 100 MG PO TABS: 100.0000 mg | ORAL_TABLET | Freq: Two times a day (BID) | ORAL | 0 refills | Status: DC

## 2017-11-13 MED ORDER — GLECAPREVIR-PIBRENTASVIR 100-40 MG PO TABS: 3.0000 | ORAL_TABLET | Freq: Every day | ORAL | 1 refills | Status: DC

## 2017-11-13 MED ORDER — ROSUVASTATIN CALCIUM 5 MG PO TABS: 5.0000 mg | ORAL_TABLET | Freq: Every day | ORAL | 1 refills | Status: DC

## 2017-11-13 NOTE — Assessment & Plan Note (Signed)
Symptoms and exam are consistent with a furuncle of the right axilla. Will start 7 day course of doxycycline. Encouraged warm compresses. If remains unresolved or symptoms worsen recommend I&D.

## 2017-11-13 NOTE — Assessment & Plan Note (Signed)
Genotype 1b with viral load of 2.2 million. Scans show F2-F3 level of fibrosis. Declines Hepatitis B vaccination today. Will plan for 8 weeks of Mavyret. Notable interaction with Mavyret and atorvastatin which we will temporarily change to rosuvastatin while taking the medication. Asymptomatic today. Plan to follow up in 1 month with pharmacy after starting medication.
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
## 2017-11-13 NOTE — Patient Instructions (Signed)
Good to see you!  We are going to get you started on Mavyret.   There is an interaction with your Lipitor and we will change it to Crestor temporarily.  Follow up with pharmacy 1 month after starting Mavyret.

## 2017-11-16 ENCOUNTER — Encounter: Payer: Self-pay | Admitting: Pharmacy Technician

## 2017-11-16 MED FILL — MAVYRET 100-40 MG TABS: 100-40 | 28 days supply | Qty: 84 | Fill #0

## 2017-12-12 MED FILL — MAVYRET 100-40 MG TABS: 100-40 | 28 days supply | Qty: 84 | Fill #1

## 2017-12-13 ENCOUNTER — Ambulatory Visit: Payer: Medicaid Other

## 2017-12-14 ENCOUNTER — Ambulatory Visit (INDEPENDENT_AMBULATORY_CARE_PROVIDER_SITE_OTHER): Payer: Medicaid Other | Admitting: Pharmacist Clinician (PhC)/ Clinical Pharmacy Specialist

## 2017-12-14 DIAGNOSIS — B182 Chronic viral hepatitis C: Secondary | ICD-10-CM

## 2017-12-14 DIAGNOSIS — Z23 Encounter for immunization: Secondary | ICD-10-CM | POA: Diagnosis not present

## 2017-12-14 DIAGNOSIS — B20 Human immunodeficiency virus [HIV] disease: Secondary | ICD-10-CM

## 2017-12-14 NOTE — Progress Notes (Signed)
HPI: Derrick Cooper is a 57 y.o. male who is here to see pharmacy for his HIV and hep C follow up.   Lab Results  Component Value Date   HCVGENOTYPE 1b 10/26/2017    Allergies: No Known Allergies  Vitals:    Past Medical History: Past Medical History:  Diagnosis Date  . Diabetes mellitus without complication (HCC)   . Hepatitis C   . HIV infection (HCC)   . Schizophrenia (HCC)   . Substance abuse (HCC)     Social History: Social History   Socioeconomic History  . Marital status: Single    Spouse name: Not on file  . Number of children: 0  . Years of education: 9  . Highest education level: Not on file  Occupational History  . Not on file  Social Needs  . Financial resource strain: Not hard at all  . Food insecurity:    Worry: Never true    Inability: Never true  . Transportation needs:    Medical: Yes    Non-medical: Not on file  Tobacco Use  . Smoking status: Current Every Day Smoker    Packs/day: 0.50    Years: 40.00    Pack years: 20.00    Types: Cigarettes  . Smokeless tobacco: Never Used  . Tobacco comment: "just not ready"  Substance and Sexual Activity  . Alcohol use: No    Frequency: Never  . Drug use: Yes    Types: Marijuana, Methamphetamines    Comment: has not used meth since 03/2017, marijuana once or twice a month  . Sexual activity: Never    Comment: declined condoms  Lifestyle  . Physical activity:    Days per week: Not on file    Minutes per session: Not on file  . Stress: Not on file  Relationships  . Social connections:    Talks on phone: Not on file    Gets together: Not on file    Attends religious service: Not on file    Active member of club or organization: Not on file    Attends meetings of clubs or organizations: Not on file    Relationship status: Not on file  Other Topics Concern  . Not on file  Social History Narrative  . Not on file    Labs: HIV 1 RNA Quant  Date Value  10/26/2017 72 copies/mL (H)  09/13/2017  148,000 Copies/mL (H)   CD4 T Cell Abs (/uL)  Date Value  10/26/2017 520  09/13/2017 370 (L)   Hep B S Ab (no units)  Date Value  09/13/2017 NON-REACTIVE   Hepatitis B Surface Ag (no units)  Date Value  09/13/2017 NON-REACTIVE    Lab Results  Component Value Date   HCVGENOTYPE 1b 10/26/2017    Hepatitis C RNA quantitative Latest Ref Rng & Units 09/13/2017  HCV Quantitative Log NOT DETECT Log IU/mL 6.35(H)    AST (U/L)  Date Value  09/13/2017 32   ALT (U/L)  Date Value  10/26/2017 36  09/13/2017 34    CrCl: CrCl cannot be calculated (Patient's most recent lab result is older than the maximum 21 days allowed.).  Fibrosis Score: F2/3 as assessed by [facility]  Child-Pugh Score: Class A  Previous Treatment Regimen: None - hep C, Viracept/zidovudine/Truvada>>Triumeq  Assessment: Derrick Cooper started on his Mavyret for his 1b virus on 4/22. He has been taking it 1 tablet three times a day rather than 3 tablets daily. It may be ok but counseled him on  taking all 3 tablets at the same time. He has not missed any doses so far. He may be late on 1 dose. He is also on his Biktarvy for his HIV. We are going to labs for both today.   He does not have surface ab for his hep B so we will revaccinate him again. He has an appt with Derrick Cooper in July so we can do the EOT VL for his hep C then. He can get the second hep B at the visit.   Recommendations:  HIV VL, hep C VL Continue Mavyret 3 tablet daily x 8 wks F/u with Derrick Cooper in July  Derrick Cooper, [place].D., BCPS, AAHIVP Clinical Infectious Disease Pharmacist [facility] 12/14/2017, 3:24 PM

## 2017-12-15 LAB — COMPLETE METABOLIC PANEL WITH GFR
AG Ratio: 0.8 (calc) — ABNORMAL LOW (ref 1.0–2.5)
ALBUMIN MSPROF: 3.7 g/dL (ref 3.6–5.1)
ALKALINE PHOSPHATASE (APISO): 87 U/L (ref 40–115)
ALT: 10 U/L (ref 9–46)
AST: 13 U/L (ref 10–35)
BILIRUBIN TOTAL: 0.5 mg/dL (ref 0.2–1.2)
BUN: 9 mg/dL (ref 7–25)
CHLORIDE: 101 mmol/L (ref 98–110)
CO2: 27 mmol/L (ref 20–32)
CREATININE: 0.94 mg/dL (ref 0.70–1.33)
Calcium: 9.3 mg/dL (ref 8.6–10.3)
GFR, Est African American: 105 mL/min/{1.73_m2} (ref >=60)
GFR, Est Non African American: 91 mL/min/{1.73_m2} (ref >=60)
GLUCOSE: 300 mg/dL — AB (ref 65–99)
Globulin: 4.5 g/dL (calc) — ABNORMAL HIGH (ref 1.9–3.7)
Potassium: 4.1 mmol/L (ref 3.5–5.3)
Sodium: 134 mmol/L — ABNORMAL LOW (ref 135–146)
Total Protein: 8.2 g/dL — ABNORMAL HIGH (ref 6.1–8.1)

## 2017-12-17 LAB — HIV-1 RNA QUANT-NO REFLEX-BLD
HIV 1 RNA Quant: 39 copies/mL — ABNORMAL HIGH
HIV-1 RNA QUANT, LOG: 1.59 {Log_copies}/mL — AB

## 2017-12-18 ENCOUNTER — Telehealth: Payer: Self-pay | Admitting: Behavioral Health

## 2017-12-18 LAB — HEPATITIS C RNA QUANTITATIVE
HCV Quantitative Log: <1.18 Log IU/mL — AB
HCV RNA, PCR, QN: DETECTED [IU]/mL — AB

## 2017-12-18 NOTE — Telephone Encounter (Signed)
Called Mr. Paulos and informed him per Marcos Eke NP that patient's Hepatitis C viral load is now undetectable and his HIV viral load is now 39 which is trending down and close to undectectable.  Also let him know his kidney and liver function look good and to continue his medications as prescribed.  Patient verbalized understanding and was appreciative for call.  Angeline Slim RN]

## 2017-12-18 NOTE — Telephone Encounter (Signed)
-----   Message from Veryl Speak, FNP sent at 12/18/2017  8:18 AM EDT ----- Please inform patient that his Hepatitis C Viral load has moved from 2.2 million to undetectable and his HIV viral load also continues to improve now at 39 which is virtually undetectable. Kidney and liver function look good. Please continue to take medications as prescribed and follow up as scheduled.

## 2018-02-05 ENCOUNTER — Ambulatory Visit: Payer: Medicaid Other | Admitting: Family

## 2018-02-05 NOTE — Progress Notes (Deleted)
Subjective:    Patient ID: Derrick Cooper, male    DOB: [birth year], 57 y.o.   MRN: [id number]  No chief complaint on file.  Hep C viral load / Hep B / Prevnar   HPI:  Derrick Cooper is a 57 y.o. male who presents today for a routine follow up of his HIV disease.   Mr. Mcmanaway was last seen in the office on 10/26/17 with continued adherence and tolerance to his regimen of Biktarvy. His most recent CD4 count was 520 and a viral load that was undetectable. He has also been treated for Hepatitis C with his most recent viral load was undetectable and is nearing his completion of treatment.   He reports good adherence with his current regimen of Biktarvy and Mavyret with no adverse side effects.      No Known Allergies    Outpatient Medications Prior to Visit  Medication Sig Dispense Refill  . ACCU-CHEK AVIVA PLUS test strip 3 (three) times daily. as directed  1  . ACCU-CHEK SOFTCLIX LANCETS lancets 3 (three) times daily. as directed  1  . B-D UF III MINI PEN NEEDLES 31G X 5 MM MISC USE AS DIRECTED QID  1  . bictegravir-emtricitabine-tenofovir AF (BIKTARVY) 50-200-25 MG TABS tablet Take 1 tablet by mouth daily. 30 tablet 2  . Blood Glucose Monitoring Suppl (ACCU-CHEK AVIVA PLUS) w/Device KIT 3 (three) times daily. as directed  0  . Glecaprevir-Pibrentasvir (MAVYRET) 100-40 MG TABS Take 3 tablets by mouth daily. 84 tablet 1  . HUMALOG KWIKPEN 100 UNIT/ML KiwkPen inject 15 units subcutaneously three times a day before meals  0  . Insulin Pen Needle (PEN NEEDLES 3/16") 31G X 5 MM MISC by Does not apply route.    Marland Kitchen LANTUS SOLOSTAR 100 UNIT/ML Solostar Pen inject 60 units subcutaneously twice a day  0  . lisinopril (PRINIVIL,ZESTRIL) 5 MG tablet Take 5 mg by mouth daily.  5  . metFORMIN (GLUCOPHAGE) 500 MG tablet Take 500 mg by mouth daily with breakfast.     . rosuvastatin (CRESTOR) 5 MG tablet Take 1 tablet (5 mg total) by mouth daily. 30 tablet 1   No facility-administered medications  prior to visit.      Past Medical History:  Diagnosis Date  . Diabetes mellitus without complication ([place])   . Hepatitis C   . HIV infection ([place])   . Schizophrenia ([place])   . Substance abuse ([place])      No past surgical history on file.     Review of Systems  Constitutional: Negative for activity change, appetite change, diaphoresis, fatigue, fever and unexpected weight change.  HENT: Negative for congestion, sinus pressure and sore throat.   Respiratory: Negative for cough, chest tightness, shortness of breath and wheezing.   Cardiovascular: Negative for chest pain and leg swelling.  Gastrointestinal: Negative for abdominal pain, constipation, diarrhea, nausea and vomiting.  Genitourinary: Negative for dysuria, flank pain, frequency, genital sores, hematuria and urgency.  Neurological: Negative for weakness and headaches.      Objective:    There were no vitals taken for this visit. Nursing note and vital signs reviewed.  Physical Exam  Constitutional: He is oriented to person, place, and time. He appears well-developed. No distress.  HENT:  Mouth/Throat: Oropharynx is clear and moist.  Eyes: Conjunctivae are normal.  Neck: Neck supple.  Cardiovascular: Normal rate, regular rhythm, normal heart sounds and intact distal pulses. Exam reveals no gallop and no friction rub.  No murmur heard.  Pulmonary/Chest: Effort normal and breath sounds normal. No respiratory distress. He has no wheezes. He has no rales. He exhibits no tenderness.  Abdominal: Soft. Bowel sounds are normal. There is no tenderness.  Lymphadenopathy:    He has no cervical adenopathy.  Neurological: He is alert and oriented to person, place, and time.  Skin: Skin is warm and dry. No rash noted.  Psychiatric: He has a normal mood and affect. His behavior is normal. Judgment and thought content normal.       Assessment & Plan:   Problem List Items Addressed This Visit      Digestive   Chronic  hepatitis C without hepatic coma ([place])     Other   HIV disease ([place]) - Primary       I am having Derrick Cooper maintain his ACCU-CHEK AVIVA PLUS, ACCU-CHEK AVIVA PLUS, LANTUS SOLOSTAR, HUMALOG KWIKPEN, ACCU-CHEK SOFTCLIX LANCETS, bictegravir-emtricitabine-tenofovir AF, Pen Needles 3/16", B-D UF III MINI PEN NEEDLES, metFORMIN, lisinopril, Glecaprevir-Pibrentasvir, and rosuvastatin.   No orders of the defined types were placed in this encounter.    Follow-up: No follow-ups on file.   Terri Piedra, MSN, [facility]

## 2018-06-04 ENCOUNTER — Other Ambulatory Visit: Payer: Self-pay | Admitting: Behavioral Health

## 2018-06-04 ENCOUNTER — Other Ambulatory Visit: Payer: Medicaid Other

## 2018-06-04 ENCOUNTER — Ambulatory Visit (INDEPENDENT_AMBULATORY_CARE_PROVIDER_SITE_OTHER): Payer: Medicaid Other

## 2018-06-04 DIAGNOSIS — B20 Human immunodeficiency virus [HIV] disease: Secondary | ICD-10-CM

## 2018-06-04 DIAGNOSIS — Z23 Encounter for immunization: Secondary | ICD-10-CM

## 2018-06-05 LAB — T-HELPER CELL (CD4) - (RCID CLINIC ONLY)
CD4 T CELL ABS: 390 /uL — AB (ref 400–2700)
CD4 T CELL HELPER: 16 % — AB (ref 33–55)

## 2018-06-06 LAB — CBC WITH DIFFERENTIAL/PLATELET
BASOS ABS: 41 {cells}/uL (ref 0–200)
Basophils Relative: 0.8 %
Eosinophils Absolute: 82 cells/uL (ref 15–500)
Eosinophils Relative: 1.6 %
HEMATOCRIT: 46.2 % (ref 38.5–50.0)
Hemoglobin: 15.7 g/dL (ref 13.2–17.1)
LYMPHS ABS: 2341 {cells}/uL (ref 850–3900)
MCH: 30.3 pg (ref 27.0–33.0)
MCHC: 34 g/dL (ref 32.0–36.0)
MCV: 89 fL (ref 80.0–100.0)
MPV: 12.1 fL (ref 7.5–12.5)
Monocytes Relative: 5.7 %
NEUTROS PCT: 46 %
Neutro Abs: 2346 cells/uL (ref 1500–7800)
Platelets: 159 10*3/uL (ref 140–400)
RBC: 5.19 10*6/uL (ref 4.20–5.80)
RDW: 12.1 % (ref 11.0–15.0)
TOTAL LYMPHOCYTE: 45.9 %
WBC: 5.1 10*3/uL (ref 3.8–10.8)
WBCMIX: 291 {cells}/uL (ref 200–950)

## 2018-06-06 LAB — COMPREHENSIVE METABOLIC PANEL
AG Ratio: 1.1 (calc) (ref 1.0–2.5)
ALBUMIN MSPROF: 4 g/dL (ref 3.6–5.1)
ALT: 7 U/L — ABNORMAL LOW (ref 9–46)
AST: 11 U/L (ref 10–35)
Alkaline phosphatase (APISO): 98 U/L (ref 40–115)
BILIRUBIN TOTAL: 1 mg/dL (ref 0.2–1.2)
BUN: 11 mg/dL (ref 7–25)
CALCIUM: 9.7 mg/dL (ref 8.6–10.3)
CHLORIDE: 99 mmol/L (ref 98–110)
CO2: 29 mmol/L (ref 20–32)
Creat: 0.99 mg/dL (ref 0.70–1.33)
GLOBULIN: 3.8 g/dL — AB (ref 1.9–3.7)
Glucose, Bld: 366 mg/dL — ABNORMAL HIGH (ref 65–99)
POTASSIUM: 4.9 mmol/L (ref 3.5–5.3)
SODIUM: 134 mmol/L — AB (ref 135–146)
TOTAL PROTEIN: 7.8 g/dL (ref 6.1–8.1)

## 2018-06-06 LAB — HIV-1 RNA QUANT-NO REFLEX-BLD
HIV 1 RNA Quant: <20 copies/mL
HIV-1 RNA Quant, Log: <1.3 Log copies/mL

## 2018-06-26 ENCOUNTER — Other Ambulatory Visit: Payer: Self-pay

## 2018-06-26 ENCOUNTER — Ambulatory Visit (INDEPENDENT_AMBULATORY_CARE_PROVIDER_SITE_OTHER): Payer: Medicaid Other | Admitting: Family

## 2018-06-26 ENCOUNTER — Encounter: Payer: Self-pay | Admitting: Family

## 2018-06-26 VITALS — BP 101/52 | HR 108 | Temp 98.2°F | Wt 149.0 lb

## 2018-06-26 DIAGNOSIS — B20 Human immunodeficiency virus [HIV] disease: Secondary | ICD-10-CM

## 2018-06-26 DIAGNOSIS — Z0000 Encounter for general adult medical examination without abnormal findings: Secondary | ICD-10-CM | POA: Insufficient documentation

## 2018-06-26 DIAGNOSIS — Z23 Encounter for immunization: Secondary | ICD-10-CM | POA: Diagnosis not present

## 2018-06-26 DIAGNOSIS — B182 Chronic viral hepatitis C: Secondary | ICD-10-CM

## 2018-06-26 DIAGNOSIS — Z720 Tobacco use: Secondary | ICD-10-CM | POA: Insufficient documentation

## 2018-06-26 NOTE — Assessment & Plan Note (Signed)
[name] continues to smoke tobacco with a brief successful extubation for approximately 3 to 4 days.  Discussed risks of cardiovascular, respiratory, and malignant disease in the future with continued tobacco use.  He appears to be in the contemplation stage of change as he is making positive progress.  Continue to monitor.

## 2018-06-26 NOTE — Assessment & Plan Note (Signed)
Derrick Cooper completed his treatment with Mavyret and unfortunately missed his end of treatment appointment.  It has been greater than 3 months since his completion of treatment and will check viral load today.  If he remains undetectable he will essentially be cured of hepatitis C.  Emphasized importance that he does not have immunity and can require infection with the same risk factors as previous.  He does not require hepatocellular carcinoma screenings with elastography score of F2.  Follow-up as needed pending viral load results.

## 2018-06-26 NOTE — Patient Instructions (Signed)
Nice to see you!  Please continue to take your LiscombBiktarvy as prescribed.  We will check your Hepatitis C status today.  We will get you signed up for the dentist.   Plan for follow up in 4 months or sooner if needed with lab work 1-2 weeks prior to appointment.

## 2018-06-26 NOTE — Assessment & Plan Note (Signed)
Prevnar and Menveo updated today.  Referral to dental clinic placed for dental screening.  Encouraged to establish with primary care to obtain colonoscopy for colon cancer screening.

## 2018-06-26 NOTE — Progress Notes (Signed)
Subjective:    Patient ID: Derrick Cooper, male    DOB: [birth year], 56 y.o.   MRN: [id number]  Chief Complaint  Patient presents with  . HIV Positive/AIDS  . Hepatitis C     HPI:  Derrick Cooper is a 57 y.o. male who presents today for routine follow-up of HIV disease and hepatitis C.  Derrick Cooper was last seen in the office on 11/13/2017 for treatment of hepatitis C and started on [place] which he completed at the end of July.  He was also maintained on his antiretroviral therapy with Biktarvy having a viral load of 39 and CD4 count of 520.  Most recent blood work completed on 06/04/2018 shows a CD4 count of 390 and a viral load that remains undetectable.  Kidney function, liver function, electrolytes are within normal ranges.  Health maintenance due includes Prevnar and Menveo as well as a dental screening.  Derrick Cooper continues to take his Biktarvy as prescribed with no adverse side effects or missed doses.  Continues to receive medication coverage through [facility] and has no problems obtaining his medications.  He does have several questions regarding tenofovir and renal function.  He was working on quitting smoking and has abstained for several days but then restarted smoking again.  Denies fevers, chills, night sweats, headaches, changes in vision, neck pain/stiffness, nausea, diarrhea, vomiting, lesions or rashes.    No Known Allergies    Outpatient Medications Prior to Visit  Medication Sig Dispense Refill  . ACCU-CHEK AVIVA PLUS test strip 3 (three) times daily. as directed  1  . ACCU-CHEK SOFTCLIX LANCETS lancets 3 (three) times daily. as directed  1  . B-D UF III MINI PEN NEEDLES 31G X 5 MM MISC USE AS DIRECTED QID  1  . bictegravir-emtricitabine-tenofovir AF (BIKTARVY) 50-200-25 MG TABS tablet Take 1 tablet by mouth daily. 30 tablet 2  . Blood Glucose Monitoring Suppl (ACCU-CHEK AVIVA PLUS) w/Device KIT 3 (three) times daily. as directed  0  . Glecaprevir-Pibrentasvir  (MAVYRET) 100-40 MG TABS Take 3 tablets by mouth daily. 84 tablet 1  . HUMALOG KWIKPEN 100 UNIT/ML KiwkPen inject 15 units subcutaneously three times a day before meals  0  . Insulin Pen Needle (PEN NEEDLES 3/16") 31G X 5 MM MISC by Does not apply route.    Marland Kitchen LANTUS SOLOSTAR 100 UNIT/ML Solostar Pen inject 60 units subcutaneously twice a day  0  . lisinopril (PRINIVIL,ZESTRIL) 5 MG tablet Take 5 mg by mouth daily.  5  . metFORMIN (GLUCOPHAGE) 500 MG tablet Take 500 mg by mouth daily with breakfast.     . rosuvastatin (CRESTOR) 5 MG tablet Take 1 tablet (5 mg total) by mouth daily. 30 tablet 1   No facility-administered medications prior to visit.      Past Medical History:  Diagnosis Date  . Diabetes mellitus without complication ([place])   . Hepatitis C   . HIV infection ([place])   . Schizophrenia ([place])   . Substance abuse ([place])      History reviewed. No pertinent surgical history.   Review of Systems  Constitutional: Negative for appetite change, chills, diaphoresis, fatigue, fever and unexpected weight change.  Eyes: Negative for visual disturbance.  Respiratory: Negative for cough, chest tightness, shortness of breath and wheezing.   Cardiovascular: Negative for chest pain and leg swelling.  Gastrointestinal: Negative for abdominal distention, abdominal pain, constipation, diarrhea, nausea and vomiting.  Genitourinary: Negative for dysuria, flank pain, frequency, genital sores, hematuria and urgency.  Skin: Negative for rash.  Allergic/Immunologic: Negative for immunocompromised state.  Neurological: Negative for dizziness, weakness and headaches.  Hematological: Does not bruise/bleed easily.      Objective:    BP (!) 101/52   Pulse (!) 108   Temp 98.2 F (36.8 C)   Wt 149 lb (67.6 kg)   BMI 19.13 kg/m  Nursing note and vital signs reviewed.  Physical Exam  Constitutional: He is oriented to person, place, and time. He appears well-developed. No distress.  HENT:    Mouth/Throat: Oropharynx is clear and moist.  Eyes: Conjunctivae are normal.  Neck: Neck supple.  Cardiovascular: Normal rate, regular rhythm, normal heart sounds and intact distal pulses. Exam reveals no gallop and no friction rub.  No murmur heard. Pulmonary/Chest: Effort normal and breath sounds normal. No respiratory distress. He has no wheezes. He has no rales. He exhibits no tenderness.  Abdominal: Soft. Bowel sounds are normal. He exhibits no distension, no ascites and no mass. There is no hepatosplenomegaly. There is no tenderness. There is no rebound and no guarding.  Lymphadenopathy:    He has no cervical adenopathy.  Neurological: He is alert and oriented to person, place, and time.  Skin: Skin is warm and dry. No rash noted.  Psychiatric: He has a normal mood and affect. His behavior is normal. Judgment and thought content normal.       Assessment & Plan:   Problem List Items Addressed This Visit      Digestive   Chronic hepatitis C without hepatic coma ([place])    Is to roughen completed his treatment with [place] and unfortunately missed his end of treatment appointment.  It has been greater than 3 months since his completion of treatment and will check viral load today.  If he remains undetectable he will essentially be cured of hepatitis C.  Emphasized importance that he does not have immunity and can require infection with the same risk factors as previous.  He does not require hepatocellular carcinoma screenings with elastography score of F2.  Follow-up as needed pending viral load results.      Relevant Orders   Hepatitis C RNA quantitative     Other   HIV disease ([place]) - Primary    Derrick Cooper has well-controlled HIV disease with a viral load that remains undetectable with his current antiretroviral regimen of Biktarvy with no adverse side effects.  He has no problems obtaining his medications.  No signs/symptoms of opportunistic infection or progressive HIV disease at  present.  Continue current dose of Biktarvy.  Declines condoms as he is not sexually active presently.  Follow-up office visit in 6 months or sooner if needed with blood work 1 to 2 weeks prior to appointment.      Relevant Orders   T-helper cell (CD4)- ([facility] clinic only)   HIV-1 RNA quant-no reflex-bld   CBC   Comprehensive metabolic panel   Lipid panel   RPR   Urine cytology ancillary only([place])   MENINGOCOCCAL MCV4O (Completed)   Pneumococcal conjugate vaccine 13-valent IM (Completed)   Health care maintenance    Prevnar and Menveo updated today.  Referral to dental clinic placed for dental screening.  Encouraged to establish with primary care to obtain colonoscopy for colon cancer screening.      Tobacco use    Derrick Cooper continues to smoke tobacco with a brief successful extubation for approximately 3 to 4 days.  Discussed risks of cardiovascular, respiratory, and malignant disease in the future with  continued tobacco use.  He appears to be in the contemplation stage of change as he is making positive progress.  Continue to monitor.       Other Visit Diagnoses    Need for pneumococcal vaccine       Relevant Orders   Pneumococcal conjugate vaccine 13-valent IM (Completed)   Need for meningococcal vaccination       Relevant Orders   MENINGOCOCCAL MCV4O (Completed)       I am having Derrick Cooper maintain his ACCU-CHEK AVIVA PLUS, ACCU-CHEK AVIVA PLUS, LANTUS SOLOSTAR, HUMALOG KWIKPEN, ACCU-CHEK SOFTCLIX LANCETS, bictegravir-emtricitabine-tenofovir AF, Pen Needles 3/16", B-D UF III MINI PEN NEEDLES, metFORMIN, lisinopril, Glecaprevir-Pibrentasvir, and rosuvastatin.   No orders of the defined types were placed in this encounter.    Follow-up: Return in about 6 months (around 12/25/2018), or if symptoms worsen or fail to improve.   Terri Piedra, MSN, FNP-C Nurse Practitioner [facility] for Infectious Disease [facility] Office phone:  [phone] Pager: [place] number: [phone]

## 2018-06-26 NOTE — Addendum Note (Signed)
Addended by: Mariea ClontsGREEN, Leanza Shepperson D on: 06/26/2018 02:07 PM   Modules accepted: Orders

## 2018-06-26 NOTE — Assessment & Plan Note (Signed)
[name] has well-controlled HIV disease with a viral load that remains undetectable with his current antiretroviral regimen of Biktarvy with no adverse side effects.  He has no problems obtaining his medications.  No signs/symptoms of opportunistic infection or progressive HIV disease at present.  Continue current dose of Biktarvy.  Declines condoms as he is not sexually active presently.  Follow-up office visit in 6 months or sooner if needed with blood work 1 to 2 weeks prior to appointment.

## 2018-07-09 ENCOUNTER — Emergency Department (HOSPITAL_COMMUNITY)
Admission: EM | Admit: 2018-07-09 | Discharge: 2018-07-11 | Disposition: A | Discharge status: Other Acute Inpatient Hospital | Payer: Medicaid Other | Attending: Emergency Medicine | Admitting: Emergency Medicine

## 2018-07-09 DIAGNOSIS — I9589 Other hypotension: Secondary | ICD-10-CM

## 2018-07-09 DIAGNOSIS — Z794 Long term (current) use of insulin: Secondary | ICD-10-CM | POA: Insufficient documentation

## 2018-07-09 DIAGNOSIS — Z79899 Other long term (current) drug therapy: Secondary | ICD-10-CM | POA: Insufficient documentation

## 2018-07-09 DIAGNOSIS — E119 Type 2 diabetes mellitus without complications: Secondary | ICD-10-CM | POA: Insufficient documentation

## 2018-07-09 DIAGNOSIS — F209 Schizophrenia, unspecified: Secondary | ICD-10-CM | POA: Diagnosis present

## 2018-07-09 DIAGNOSIS — E861 Hypovolemia: Secondary | ICD-10-CM

## 2018-07-09 DIAGNOSIS — F17210 Nicotine dependence, cigarettes, uncomplicated: Secondary | ICD-10-CM | POA: Insufficient documentation

## 2018-07-09 DIAGNOSIS — R45851 Suicidal ideations: Secondary | ICD-10-CM | POA: Insufficient documentation

## 2018-07-09 LAB — CBC WITH DIFFERENTIAL/PLATELET
Abs Immature Granulocytes: 0.01 10*3/uL (ref 0.00–0.07)
BASOS PCT: 1 %
Basophils Absolute: 0 10*3/uL (ref 0.0–0.1)
Eosinophils Absolute: 0.1 10*3/uL (ref 0.0–0.5)
Eosinophils Relative: 1 %
HCT: 48.1 % (ref 39.0–52.0)
Hemoglobin: 16 g/dL (ref 13.0–17.0)
Immature Granulocytes: 0 %
Lymphocytes Relative: 38 %
Lymphs Abs: 2.1 10*3/uL (ref 0.7–4.0)
MCH: 30.1 pg (ref 26.0–34.0)
MCHC: 33.3 g/dL (ref 30.0–36.0)
MCV: 90.6 fL (ref 80.0–100.0)
Monocytes Absolute: 0.4 10*3/uL (ref 0.1–1.0)
Monocytes Relative: 7 %
Neutro Abs: 2.9 10*3/uL (ref 1.7–7.7)
Neutrophils Relative %: 53 %
PLATELETS: 142 10*3/uL — AB (ref 150–400)
RBC: 5.31 MIL/uL (ref 4.22–5.81)
RDW: 12.6 % (ref 11.5–15.5)
WBC: 5.5 10*3/uL (ref 4.0–10.5)
nRBC: 0 % (ref 0.0–0.2)

## 2018-07-09 LAB — COMPREHENSIVE METABOLIC PANEL
ALT: 18 U/L (ref 0–44)
AST: 42 U/L — ABNORMAL HIGH (ref 15–41)
Albumin: 4.3 g/dL (ref 3.5–5.0)
Alkaline Phosphatase: 78 U/L (ref 38–126)
Anion gap: 10 (ref 5–15)
BUN: 16 mg/dL (ref 6–20)
CHLORIDE: 101 mmol/L (ref 98–111)
CO2: 24 mmol/L (ref 22–32)
Calcium: 9.2 mg/dL (ref 8.9–10.3)
Creatinine, Ser: 0.79 mg/dL (ref 0.61–1.24)
GFR calc Af Amer: >60 mL/min (ref >60)
GFR calc non Af Amer: >60 mL/min (ref >60)
Glucose, Bld: 214 mg/dL — ABNORMAL HIGH (ref 70–99)
Potassium: 3.3 mmol/L — ABNORMAL LOW (ref 3.5–5.1)
Sodium: 135 mmol/L (ref 135–145)
Total Bilirubin: 1.7 mg/dL — ABNORMAL HIGH (ref 0.3–1.2)
Total Protein: 8.6 g/dL — ABNORMAL HIGH (ref 6.5–8.1)

## 2018-07-09 LAB — ETHANOL: Alcohol, Ethyl (B): <10 mg/dL (ref <10)

## 2018-07-09 LAB — RAPID URINE DRUG SCREEN, HOSP PERFORMED
Amphetamines: NOT DETECTED
Barbiturates: NOT DETECTED
Benzodiazepines: NOT DETECTED
Cocaine: NOT DETECTED
Opiates: NOT DETECTED
Tetrahydrocannabinol: POSITIVE — AB

## 2018-07-09 LAB — SALICYLATE LEVEL

## 2018-07-09 LAB — CBG MONITORING, ED
GLUCOSE-CAPILLARY: 184 mg/dL — AB (ref 70–99)
Glucose-Capillary: 145 mg/dL — ABNORMAL HIGH (ref 70–99)
Glucose-Capillary: 291 mg/dL — ABNORMAL HIGH (ref 70–99)
Glucose-Capillary: 293 mg/dL — ABNORMAL HIGH (ref 70–99)

## 2018-07-09 LAB — ACETAMINOPHEN LEVEL: Acetaminophen (Tylenol), Serum: <10 ug/mL — ABNORMAL LOW (ref 10–30)

## 2018-07-09 MED ORDER — INSULIN LISPRO 100 UNIT/ML ~~LOC~~ SOLN
15.0000 [IU] | Freq: Three times a day (TID) | SUBCUTANEOUS | Status: DC
  Filled 2018-07-09: qty 10

## 2018-07-09 MED ORDER — SODIUM CHLORIDE 0.9 % IV BOLUS
1000.0000 mL | Freq: Once | INTRAVENOUS | Status: AC
  Administered 2018-07-09: 1000 mL via INTRAVENOUS

## 2018-07-09 MED ORDER — ROSUVASTATIN CALCIUM 5 MG PO TABS
5.0000 mg | ORAL_TABLET | Freq: Every day | ORAL | Status: DC
  Administered 2018-07-09 – 2018-07-11 (×3): 5 mg via ORAL
  Filled 2018-07-09 (×3): qty 1

## 2018-07-09 MED ORDER — ONDANSETRON HCL 4 MG/2ML IJ SOLN
4.0000 mg | Freq: Once | INTRAMUSCULAR | Status: AC
  Administered 2018-07-10: 4 mg via INTRAVENOUS
  Filled 2018-07-09: qty 2

## 2018-07-09 MED ORDER — LISINOPRIL 10 MG PO TABS
5.0000 mg | ORAL_TABLET | Freq: Every day | ORAL | Status: DC
  Administered 2018-07-09: 5 mg via ORAL
  Filled 2018-07-09: qty 1

## 2018-07-09 MED ORDER — METFORMIN HCL 500 MG PO TABS
500.0000 mg | ORAL_TABLET | Freq: Every day | ORAL | Status: DC
  Administered 2018-07-10: 500 mg via ORAL
  Filled 2018-07-09: qty 1

## 2018-07-09 MED ORDER — GLECAPREVIR-PIBRENTASVIR 100-40 MG PO TABS
3.0000 | ORAL_TABLET | Freq: Every day | ORAL | Status: DC
  Administered 2018-07-10: 3 via ORAL

## 2018-07-09 MED ORDER — INSULIN ASPART 100 UNIT/ML ~~LOC~~ SOLN
15.0000 [IU] | Freq: Three times a day (TID) | SUBCUTANEOUS | Status: DC
  Filled 2018-07-09: qty 1

## 2018-07-09 MED ORDER — POTASSIUM CHLORIDE CRYS ER 20 MEQ PO TBCR
40.0000 meq | EXTENDED_RELEASE_TABLET | Freq: Once | ORAL | Status: AC
  Administered 2018-07-09: 40 meq via ORAL
  Filled 2018-07-09: qty 2

## 2018-07-09 MED ORDER — BICTEGRAVIR-EMTRICITAB-TENOFOV 50-200-25 MG PO TABS
1.0000 | ORAL_TABLET | Freq: Every day | ORAL | Status: DC
  Administered 2018-07-09 – 2018-07-11 (×3): 1 via ORAL
  Filled 2018-07-09 (×3): qty 1

## 2018-07-09 MED ORDER — INSULIN GLARGINE 100 UNIT/ML ~~LOC~~ SOLN
60.0000 [IU] | Freq: Two times a day (BID) | SUBCUTANEOUS | Status: DC
  Administered 2018-07-09 – 2018-07-10 (×3): 60 [IU] via SUBCUTANEOUS
  Filled 2018-07-09 (×4): qty 0.6

## 2018-07-09 MED ORDER — GLECAPREVIR-PIBRENTASVIR 100-40 MG PO TABS
3.0000 | ORAL_TABLET | Freq: Every day | ORAL | Status: DC
  Filled 2018-07-09: qty 3

## 2018-07-09 MED ORDER — OLANZAPINE 5 MG PO TABS
5.0000 mg | ORAL_TABLET | Freq: Two times a day (BID) | ORAL | Status: DC
  Administered 2018-07-09 – 2018-07-11 (×4): 5 mg via ORAL
  Filled 2018-07-09 (×5): qty 1
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
## 2018-07-09 NOTE — ED Notes (Signed)
ED Provider at bedside. 

## 2018-07-09 NOTE — ED Notes (Signed)
Pt oriented to room and unit.   Pt is calm and cooperative.  15 minute checks and video monitoring in place. 

## 2018-07-09 NOTE — ED Provider Notes (Addendum)
Time seen 23:25 PM  Patient had been admitted earlier today for psychiatric evaluation.  He states about 9 PM he was feeling sleepy and lightheaded.  He was noted to be hypotensive with blood pressure 87/57.  He denies headache, chest pain, shortness of breath, vomiting but does state he has having some nausea.  He states he is on the same medication here that he was on at home.  He states he ate today.  Patient is resting however he is easily awakened.  He is wearing a patch over his left eye.  His tongue is noted to be very dry but he states it is usually dry.  At the time of my exam his blood pressure was 86/55, heart rate 73, pulse ox was 95%.  Patient is currently getting a liter of fluid bolus.  I ordered IV Zofran.  Waiting for repeat blood work to be done.  Patient's potassium noted to be low, he was put on a oral potassium regimen.  His magnesium was normal.  Nurses report after his first liter his blood pressure improved into the 90s however once the IV quit running his blood pressure dropped into the 80s again.  He was given a second liter of normal saline.  Recheck at 3:30 AM patient is sleeping, when he wakes up he states he still has some minor dizziness.  He states however he feels like he needs to have urinary output.  Patient's blood pressure improved after his third liter of IV fluids up to 97/76 however when IV fluids stopped his blood pressure dropped back down to 83/65.   Nurses report after patient's third liter he was able to void but nursing staff states it was only about 200 cc and was still very dark, he was given a 4th L of IV fluids.  6 AM patient's blood pressure is in the mid 90s now.  His lisinopril has already been discontinued.  6:20 AM patient is sleeping.  When I checked his blood pressure was 91 systolic.  I woke him up and had a move his arms and legs and rechecked his blood pressure and it was 99/66.  He urinated again and had about another 200 cc of dark urine  output.  Patient states he feels like he can drink now.  Nursing staff was informed so he can drink.  He is close to being medically released back to finish his psychiatric evaluation.  7:30 AM patient turned over to Dr. Judd Lien.  His last blood pressure is 98/70, they are getting him breakfast and he is going to drink.  Hopefully he will be medically cleared soon and finish his psychiatric evaluation.  8 AM patient's blood pressure is 108/80.  Results for orders placed or performed during the hospital encounter of 07/09/18  Acetaminophen level  Result Value Ref Range   Acetaminophen (Tylenol), Serum <10 (L) 10 - 30 ug/mL  Comprehensive metabolic panel  Result Value Ref Range   Sodium 135 135 - 145 mmol/L   Potassium 3.3 (L) 3.5 - 5.1 mmol/L   Chloride 101 98 - 111 mmol/L   CO2 24 22 - 32 mmol/L   Glucose, Bld 214 (H) 70 - 99 mg/dL   BUN 16 6 - 20 mg/dL   Creatinine, Ser 1.61 0.61 - 1.24 mg/dL   Calcium 9.2 8.9 - 09.6 mg/dL   Total Protein 8.6 (H) 6.5 - 8.1 g/dL   Albumin 4.3 3.5 - 5.0 g/dL   AST 42 (H) 15 - 41 U/L  ALT 18 0 - 44 U/L   Alkaline Phosphatase 78 38 - 126 U/L   Total Bilirubin 1.7 (H) 0.3 - 1.2 mg/dL   GFR calc non Af Amer >60 >60 mL/min   GFR calc Af Amer >60 >60 mL/min   Anion gap 10 5 - 15  Ethanol  Result Value Ref Range   Alcohol, Ethyl (B) <10 <10 mg/dL  Salicylate level  Result Value Ref Range   Salicylate Lvl <7.0 2.8 - 30.0 mg/dL  CBC with Differential  Result Value Ref Range   WBC 5.5 4.0 - 10.5 K/uL   RBC 5.31 4.22 - 5.81 MIL/uL   Hemoglobin 16.0 13.0 - 17.0 g/dL   HCT 16.1 09.6 - 04.5 %   MCV 90.6 80.0 - 100.0 fL   MCH 30.1 26.0 - 34.0 pg   MCHC 33.3 30.0 - 36.0 g/dL   RDW 40.9 81.1 - 91.4 %   Platelets 142 (L) 150 - 400 K/uL   nRBC 0.0 0.0 - 0.2 %   Neutrophils Relative % 53 %   Neutro Abs 2.9 1.7 - 7.7 K/uL   Lymphocytes Relative 38 %   Lymphs Abs 2.1 0.7 - 4.0 K/uL   Monocytes Relative 7 %   Monocytes Absolute 0.4 0.1 - 1.0 K/uL    Eosinophils Relative 1 %   Eosinophils Absolute 0.1 0.0 - 0.5 K/uL   Basophils Relative 1 %   Basophils Absolute 0.0 0.0 - 0.1 K/uL   Immature Granulocytes 0 %   Abs Immature Granulocytes 0.01 0.00 - 0.07 K/uL  Urine rapid drug screen (hosp performed)  Result Value Ref Range   Opiates NONE DETECTED NONE DETECTED   Cocaine NONE DETECTED NONE DETECTED   Benzodiazepines NONE DETECTED NONE DETECTED   Amphetamines NONE DETECTED NONE DETECTED   Tetrahydrocannabinol POSITIVE (A) NONE DETECTED   Barbiturates NONE DETECTED NONE DETECTED  CBC with Differential  Result Value Ref Range   WBC 5.5 4.0 - 10.5 K/uL   RBC 4.49 4.22 - 5.81 MIL/uL   Hemoglobin 13.5 13.0 - 17.0 g/dL   HCT 78.2 95.6 - 21.3 %   MCV 89.5 80.0 - 100.0 fL   MCH 30.1 26.0 - 34.0 pg   MCHC 33.6 30.0 - 36.0 g/dL   RDW 08.6 57.8 - 46.9 %   Platelets 121 (L) 150 - 400 K/uL   nRBC 0.0 0.0 - 0.2 %   Neutrophils Relative % 38 %   Neutro Abs 2.1 1.7 - 7.7 K/uL   Lymphocytes Relative 52 %   Lymphs Abs 2.9 0.7 - 4.0 K/uL   Monocytes Relative 7 %   Monocytes Absolute 0.4 0.1 - 1.0 K/uL   Eosinophils Relative 2 %   Eosinophils Absolute 0.1 0.0 - 0.5 K/uL   Basophils Relative 1 %   Basophils Absolute 0.0 0.0 - 0.1 K/uL   Immature Granulocytes 0 %   Abs Immature Granulocytes 0.01 0.00 - 0.07 K/uL  Comprehensive metabolic panel  Result Value Ref Range   Sodium 135 135 - 145 mmol/L   Potassium 3.0 (L) 3.5 - 5.1 mmol/L   Chloride 104 98 - 111 mmol/L   CO2 26 22 - 32 mmol/L   Glucose, Bld 272 (H) 70 - 99 mg/dL   BUN 21 (H) 6 - 20 mg/dL   Creatinine, Ser 6.29 0.61 - 1.24 mg/dL   Calcium 8.3 (L) 8.9 - 10.3 mg/dL   Total Protein 6.1 (L) 6.5 - 8.1 g/dL   Albumin 3.0 (L) 3.5 - 5.0  g/dL   AST 30 15 - 41 U/L   ALT 15 0 - 44 U/L   Alkaline Phosphatase 62 38 - 126 U/L   Total Bilirubin 1.2 0.3 - 1.2 mg/dL   GFR calc non Af Amer >60 >60 mL/min   GFR calc Af Amer >60 >60 mL/min   Anion gap 5 5 - 15  Magnesium  Result Value Ref  Range   Magnesium 1.8 1.7 - 2.4 mg/dL  Troponin I - ONCE - STAT  Result Value Ref Range   Troponin I <0.03 <0.03 ng/mL  CBG monitoring, ED  Result Value Ref Range   Glucose-Capillary 184 (H) 70 - 99 mg/dL  CBG monitoring, ED  Result Value Ref Range   Glucose-Capillary 145 (H) 70 - 99 mg/dL  CBG monitoring, ED  Result Value Ref Range   Glucose-Capillary 293 (H) 70 - 99 mg/dL  CBG monitoring, ED  Result Value Ref Range   Glucose-Capillary 291 (H) 70 - 99 mg/dL    Laboratory interpretation all normal except hypokalemia with normal magnesium     EKG Interpretation  Date/Time:  Monday July 09 2018 23:21:11 EST Ventricular Rate:  75 PR Interval:    QRS Duration: 133 QT Interval:  401 QTC Calculation: 448 R Axis:   55 Text Interpretation:  Sinus rhythm Nonspecific intraventricular conduction delay Probable anteroseptal infarct, recent Nonspecific ST abnormality No old tracing to compare Confirmed by Devoria AlbeKnapp, Guenther Dunshee ([id number]) on 07/09/2018 11:24:33 PM      Diagnoses that have been ruled out:  None  Diagnoses that are still under consideration:  None  Final diagnoses:  Suicidal ideation  Schizophrenia, unspecified type (HCC)  Hypotension due to hypovolemia    Plan Psychiatrist to evaluate this morning  Devoria AlbeIva Eduarda Scrivens, MD, FACEP  CRITICAL CARE Performed by: Maksym Pfiffner L Clive Parcel Total critical care time: 36 minutes Critical care time was exclusive of separately billable procedures and treating other patients. Critical care was necessary to treat or prevent imminent or life-threatening deterioration. Critical care was time spent personally by me on the following activities: development of treatment plan with patient and/or surrogate as well as nursing, discussions with consultants, evaluation of patient's response to treatment, examination of patient, obtaining history from patient or surrogate, ordering and performing treatments and interventions, ordering and review of laboratory studies,  ordering and review of radiographic studies, pulse oximetry and re-evaluation of patient's condition.     Devoria AlbeKnapp, Milt Coye, MD 07/10/18 [id number]    Devoria AlbeKnapp, Cayle Thunder, MD 07/10/18 [phone]
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
## 2018-07-09 NOTE — ED Notes (Signed)
Dr. Erma HeritageIsaacs, called pt blood pressure 83/58 hr 76. Pt complained of feeling light headed. Pt blood pressure checked 3 times. Pt also given fluids to drink. Dr. Erma HeritageIsaacs wants pt watched medically. Charge nurse Terri made aware. Currently waiting on a bed. Pt safe, will continue to monitor.

## 2018-07-09 NOTE — ED Notes (Signed)
Pt. bp 60/44, CN,Terri made aware.

## 2018-07-09 NOTE — ED Provider Notes (Signed)
I was notified by the TCU the patient was hypotensive.  He received a dose of lisinopril this afternoon, and may have taken it this morning.  He reports that he feels lightheaded with standing, but is otherwise asymptomatic at this time.  I have asked nursing to move the patient to the acute side for repeat evaluation.  Repeat lab work sent and fluids ordered.   Shaune PollackIsaacs, Dwayne Bulkley, MD 07/09/18 [phone]

## 2018-07-09 NOTE — Progress Notes (Signed)
Derrick CobbKent stated he has been feeling lightheaded at times. He was unable to state if his symptom has been days, weeks or months. His B/P remains low, he was transferred back to the main ED.

## 2018-07-09 NOTE — ED Notes (Signed)
Pt is being transported to main ED WA17, [facility]

## 2018-07-09 NOTE — ED Triage Notes (Signed)
Brought in by [facility], [facility]. +SI, has been off medications for "awhile." Cooperative, voluntary.

## 2018-07-09 NOTE — ED Notes (Signed)
Bed: WA17 Expected date:  Expected time:  Means of arrival:  Comments: Hold for secure area

## 2018-07-09 NOTE — ED Notes (Addendum)
Pharmacy notified me of not having Hepatitis medicine.  Pt informed me that treatment plan is complete.  Pt was encouraged to provide urine sample.

## 2018-07-09 NOTE — ED Notes (Signed)
Blue suitcase and bag of clothes brought over by charge nurse.

## 2018-07-09 NOTE — [facility] Assessment (Signed)
[facility] Assessment Progress Note   Case was staffed with Shaune PollackLord DNP who recommended patient be observed and monitored. Patient will also be evaluated for possible medication interventions.

## 2018-07-09 NOTE — BH Assessment (Addendum)
Assessment Note  Derrick Cooper is an 57 y.o. male that presents this date with S/I. Patient denies any H/I or VH. Patient reports he has a plan to "run into traffic." Patient reports active AH stating he "just hears things like humming all the time." Patient reports current stressors to include: recently being asked to leave his sister's residence due to medication non-compliance and increased depression associated with MH issues. Patient states he had been receiving services from [facility] until three months ago when he discontinued his medication regimen due patient stating "they were not working." Patient reports a prior history of Schizophrenia although cannot recall when he was diagnosed with that disorder. Patient renders a vague treatment history reporting multiple attempts at self harm although cannot elaborate on prior incidents or history of inpatient hospitalizations. Patient displays some active thought blocking as evidenced by not being time place oriented or ability to recall treatment history. Patient presents with pleasant affect although is observed to be drowsy speaking in a low soft voice that is difficult to understand at times. Patient states he was recently asked to leave his sister's residence where he was residing due to using cannabis. Patient reports he uses 1 gram of cannabis every other day with last use on 07/07/18 when he reported using 1 gram. UDS is pending. Patient denies any other SA use. Patient is requesting a voluntary admission to assist with medication management and stabilization. Per notes, patient brought to ED by Police after being contacted by [employer]. Patient told staff there he was having SI and been off his medications "for a while." Pt voluntary at this time.  SA. Case was staffed with Shaune Pollack DNP who recommended patient be observed and monitored. Patient will also be evaluated for possible medication interventions.     Diagnosis: F29.0 Schizophrenia  (per notes)   Past Medical History:  Past Medical History:  Diagnosis Date  . Diabetes mellitus without complication (HCC)   . Hepatitis C   . HIV infection (HCC)   . Schizophrenia (HCC)   . Substance abuse (HCC)     No past surgical history on file.  Family History:  Family History  Problem Relation Age of Onset  . Breast cancer Mother   . Lung cancer Father   . Heart attack Father     Social History:  reports that he has been smoking cigarettes. He has a 20.00 pack-year smoking history. He has never used smokeless tobacco. He reports that he has current or past drug history. Drugs: Marijuana and Methamphetamines. He reports that he does not drink alcohol.  Additional Social History:  Alcohol / Drug Use Pain Medications: See MAR Prescriptions: See MAR Over the Counter: See MAR History of alcohol / drug use?: Yes Longest period of sobriety (when/how long): Unknown Negative Consequences of Use: (Denies) Withdrawal Symptoms: (Denies) Substance #1 Name of Substance 1: Cannabis 1 - Age of First Use: Unknown 1 - Amount (size/oz): Varies 1 - Frequency: Varies 1 - Duration: Varies 1 - Last Use / Amount: 07/08/18 1 gram  CIWA: CIWA-Ar BP: 136/90 Pulse Rate: 100 COWS:    Allergies: No Known Allergies  Home Medications:  (Not in a hospital admission)  OB/GYN Status:  No LMP for male patient.  General Assessment Data Location of Assessment: [facility] ED TTS Assessment: In system Is this a Tele or Face-to-Face Assessment?: Face-to-Face Is this an Initial Assessment or a Re-assessment for this encounter?: Initial Assessment Patient Accompanied by:: (NA) Language Other than English: No  Living Arrangements: (Homeless) What gender do you identify as?: Male Marital status: Single Maiden name: NA Pregnancy Status: No Living Arrangements: Alone Can pt return to current living arrangement?: Yes Admission Status: Voluntary Is patient capable of signing voluntary admission?:  Yes Referral Source: Self/Family/Friend Insurance type: Medicaid  Medical Screening Exam [facility] Walk-in ONLY) Medical Exam completed: Yes  Crisis Care Plan Living Arrangements: Alone Legal Guardian: (NA) Name of Psychiatrist: None Name of Therapist: None  Education Status Is patient currently in school?: No Is the patient employed, unemployed or receiving disability?: Receiving disability income  Risk to self with the past 6 months Suicidal Ideation: Yes-Currently Present Has patient been a risk to self within the past 6 months prior to admission? : No Suicidal Intent: Yes-Currently Present Has patient had any suicidal intent within the past 6 months prior to admission? : No Is patient at risk for suicide?: Yes Suicidal Plan?: Yes-Currently Present Has patient had any suicidal plan within the past 6 months prior to admission? : No Specify Current Suicidal Plan: Run into traffic Access to Means: Yes Specify Access to Suicidal Means: Run into traffic What has been your use of drugs/alcohol within the last 12 months?: Current use Previous Attempts/Gestures: Yes How many times?: (Multiple) Other Self Harm Risks: NA Triggers for Past Attempts: Unknown Intentional Self Injurious Behavior: None Family Suicide History: No Recent stressful life event(s): Other (Comment)(Homeless) Persecutory voices/beliefs?: No Depression: Yes Depression Symptoms: Feeling worthless/self pity Substance abuse history and/or treatment for substance abuse?: No Suicide prevention information given to non-admitted patients: Not applicable  Risk to Others within the past 6 months Homicidal Ideation: No Does patient have any lifetime risk of violence toward others beyond the six months prior to admission? : No Thoughts of Harm to Others: No Current Homicidal Intent: No Current Homicidal Plan: No Access to Homicidal Means: No Identified Victim: NA History of harm to others?: No Assessment of Violence:  None Noted Violent Behavior Description: NA Does patient have access to weapons?: No Criminal Charges Pending?: No Does patient have a court date: No Is patient on probation?: No  Psychosis Hallucinations: None noted Delusions: None noted  Mental Status Report Appearance/Hygiene: In scrubs Eye Contact: Fair Motor Activity: Freedom of movement Speech: Soft, Slow Level of Consciousness: Drowsy Mood: Depressed Affect: Appropriate to circumstance Anxiety Level: Minimal Thought Processes: Thought Blocking Judgement: Partial Orientation: Unable to assess Obsessive Compulsive Thoughts/Behaviors: None  Cognitive Functioning Concentration: Decreased Memory: Recent Intact, Remote Intact Is patient IDD: No Insight: Fair Impulse Control: Fair Appetite: Good Have you had any weight changes? : No Change Sleep: No Change Total Hours of Sleep: 7 Vegetative Symptoms: None  ADLScreening [facility] Assessment Services) Patient's cognitive ability adequate to safely complete daily activities?: Yes Patient able to express need for assistance with ADLs?: No Independently performs ADLs?: Yes (appropriate for developmental age)  Prior Inpatient Therapy Prior Inpatient Therapy: Yes Prior Therapy Dates: Pt cannot recall Prior Therapy Facilty/Provider(s): Pt cannot recall Reason for Treatment: MH issues  Prior Outpatient Therapy Prior Outpatient Therapy: Yes Prior Therapy Dates: 2018 Prior Therapy Facilty/Provider(s): [facility] Reason for Treatment: Med mang Does patient have an ACCT team?: No Does patient have Intensive In-House Services?  : No Does patient have [facility] services? : Yes Does patient have P4CC services?: No  ADL Screening (condition at time of admission) Patient's cognitive ability adequate to safely complete daily activities?: Yes Is the patient deaf or have difficulty hearing?: No Does the patient have difficulty seeing, even when wearing glasses/contacts?: Yes Does  the  patient have difficulty concentrating, remembering, or making decisions?: No Patient able to express need for assistance with ADLs?: No Does the patient have difficulty dressing or bathing?: No Independently performs ADLs?: Yes (appropriate for developmental age) Does the patient have difficulty walking or climbing stairs?: No Weakness of Legs: None Weakness of Arms/Hands: None  Home Assistive Devices/Equipment Home Assistive Devices/Equipment: None  Therapy Consults (therapy consults require a physician order) PT Evaluation Needed: No OT Evalulation Needed: No SLP Evaluation Needed: No Abuse/Neglect Assessment (Assessment to be complete while patient is alone) Physical Abuse: Denies Verbal Abuse: Denies Sexual Abuse: Denies Exploitation of patient/patient's resources: Denies Self-Neglect: Denies Values / Beliefs Cultural Requests During Hospitalization: None Spiritual Requests During Hospitalization: None Consults Spiritual Care Consult Needed: No Social Work Consult Needed: No Merchant navy officerAdvance Directives (For Healthcare) Does Patient Have a Medical Advance Directive?: No Would patient like information on creating a medical advance directive?: No - Patient declined          Disposition: Case was staffed with Shaune PollackLord DNP who recommended patient be observed and monitored. Patient will also be evaluated for possible medication interventions.  Disposition Initial Assessment Completed for this Encounter: Yes Disposition of Patient: (Observe and monitor) Patient refused recommended treatment: No Mode of transportation if patient is discharged/movement?: (Unk)  On Site Evaluation by:   Reviewed with Physician:    Alfredia Fergusonavid L Messi Twedt 07/09/2018 12:44 PM

## 2018-07-10 DIAGNOSIS — F209 Schizophrenia, unspecified: Secondary | ICD-10-CM | POA: Diagnosis not present

## 2018-07-10 DIAGNOSIS — R45851 Suicidal ideations: Secondary | ICD-10-CM | POA: Diagnosis not present

## 2018-07-10 LAB — COMPREHENSIVE METABOLIC PANEL
ALBUMIN: 3 g/dL — AB (ref 3.5–5.0)
ALT: 15 U/L (ref 0–44)
ANION GAP: 5 (ref 5–15)
AST: 30 U/L (ref 15–41)
Alkaline Phosphatase: 62 U/L (ref 38–126)
BUN: 21 mg/dL — ABNORMAL HIGH (ref 6–20)
CO2: 26 mmol/L (ref 22–32)
Calcium: 8.3 mg/dL — ABNORMAL LOW (ref 8.9–10.3)
Chloride: 104 mmol/L (ref 98–111)
Creatinine, Ser: 0.79 mg/dL (ref 0.61–1.24)
GFR calc Af Amer: >60 mL/min (ref >60)
GFR calc non Af Amer: >60 mL/min (ref >60)
Glucose, Bld: 272 mg/dL — ABNORMAL HIGH (ref 70–99)
Potassium: 3 mmol/L — ABNORMAL LOW (ref 3.5–5.1)
SODIUM: 135 mmol/L (ref 135–145)
Total Bilirubin: 1.2 mg/dL (ref 0.3–1.2)
Total Protein: 6.1 g/dL — ABNORMAL LOW (ref 6.5–8.1)

## 2018-07-10 LAB — CBG MONITORING, ED
GLUCOSE-CAPILLARY: 60 mg/dL — AB (ref 70–99)
GLUCOSE-CAPILLARY: 68 mg/dL — AB (ref 70–99)
Glucose-Capillary: 82 mg/dL (ref 70–99)
Glucose-Capillary: 43 mg/dL — CL (ref 70–99)
Glucose-Capillary: 59 mg/dL — ABNORMAL LOW (ref 70–99)
Glucose-Capillary: 96 mg/dL (ref 70–99)
Glucose-Capillary: 147 mg/dL — ABNORMAL HIGH (ref 70–99)
Glucose-Capillary: 131 mg/dL — ABNORMAL HIGH (ref 70–99)
Glucose-Capillary: 143 mg/dL — ABNORMAL HIGH (ref 70–99)

## 2018-07-10 LAB — TROPONIN I: Troponin I: <0.03 ng/mL (ref <0.03)

## 2018-07-10 LAB — CBC WITH DIFFERENTIAL/PLATELET
Abs Immature Granulocytes: 0.01 10*3/uL (ref 0.00–0.07)
BASOS ABS: 0 10*3/uL (ref 0.0–0.1)
Basophils Relative: 1 %
Eosinophils Absolute: 0.1 10*3/uL (ref 0.0–0.5)
Eosinophils Relative: 2 %
HCT: 40.2 % (ref 39.0–52.0)
Hemoglobin: 13.5 g/dL (ref 13.0–17.0)
Immature Granulocytes: 0 %
Lymphocytes Relative: 52 %
Lymphs Abs: 2.9 10*3/uL (ref 0.7–4.0)
MCH: 30.1 pg (ref 26.0–34.0)
MCHC: 33.6 g/dL (ref 30.0–36.0)
MCV: 89.5 fL (ref 80.0–100.0)
Monocytes Absolute: 0.4 10*3/uL (ref 0.1–1.0)
Monocytes Relative: 7 %
Neutro Abs: 2.1 10*3/uL (ref 1.7–7.7)
Neutrophils Relative %: 38 %
Platelets: 121 10*3/uL — ABNORMAL LOW (ref 150–400)
RBC: 4.49 MIL/uL (ref 4.22–5.81)
RDW: 12.5 % (ref 11.5–15.5)
WBC: 5.5 10*3/uL (ref 4.0–10.5)
nRBC: 0 % (ref 0.0–0.2)

## 2018-07-10 LAB — MAGNESIUM: Magnesium: 1.8 mg/dL (ref 1.7–2.4)

## 2018-07-10 MED ORDER — POTASSIUM CHLORIDE CRYS ER 20 MEQ PO TBCR
40.0000 meq | EXTENDED_RELEASE_TABLET | Freq: Three times a day (TID) | ORAL | Status: AC
  Administered 2018-07-10 – 2018-07-11 (×4): 40 meq via ORAL
  Filled 2018-07-10 (×4): qty 2

## 2018-07-10 MED ORDER — SODIUM CHLORIDE 0.9 % IV BOLUS
1000.0000 mL | Freq: Once | INTRAVENOUS | Status: AC
  Administered 2018-07-10: 1000 mL via INTRAVENOUS

## 2018-07-10 MED ORDER — LOPERAMIDE HCL 2 MG PO CAPS
2.0000 mg | ORAL_CAPSULE | Freq: Once | ORAL | Status: AC
  Administered 2018-07-10: 2 mg via ORAL
  Filled 2018-07-10: qty 1

## 2018-07-10 NOTE — ED Notes (Signed)
Provided patient another dinner meal tray. Will check blood sugar in about 15-20 minutes.

## 2018-07-10 NOTE — ED Notes (Signed)
Pt resting alert x 4, resting watching t.v. no c/o pain. I will continue to monitor.

## 2018-07-10 NOTE — ED Notes (Signed)
Patients meal tray has arrived and he is sitting up eating. Also, spoke with Dr. Marily MemosJason Mesner about placing a consult in for his diabetes and reporting his low blood sugar.

## 2018-07-10 NOTE — ED Notes (Signed)
Informed that pt's CBG is 43.  Pt is alert and able to eat.  Pt given orange juice and graham crackers with peanut butter.  Will re-evaluate CBG 15 minutes after eating.

## 2018-07-10 NOTE — ED Notes (Signed)
While administering medication, patient stated he has used the restroom once with no diarrhea. He felt the Imodium had helped.
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
## 2018-07-10 NOTE — ED Notes (Signed)
Patient is complaining of diarrhea. Patient has went to the restroom once since 1:30.  Informed Dr. Brandon Melnick. [place] and new orders obtained.

## 2018-07-10 NOTE — ED Notes (Signed)
Patient awake to void

## 2018-07-10 NOTE — ED Notes (Signed)
Report given to Good[employer] Corporationim, Charity fundraiserN. Care transferred at this time. Patient ambulated and placed in room 31.

## 2018-07-10 NOTE — ED Notes (Signed)
He arouses easily and begins to eat his lunch tray without difficulty. His skin is normal, warm and dry and he is breathing normally.

## 2018-07-10 NOTE — ED Notes (Signed)
Patient has received NS 2 liters-SBP remains in the 80's-Dr. I.Knapp made aware and 3rd liter ordered and hung-patient is sleeping-has not voided since transfer from secure area.

## 2018-07-10 NOTE — ED Notes (Signed)
Sitter at bedside.

## 2018-07-10 NOTE — ED Notes (Signed)
Pt. CBG 291,RN,Zac made aware.

## 2018-07-10 NOTE — ED Notes (Signed)
Patients blood sugar is 60mg /dL, sitter provided orange juice. Patient has not received his dinner meal tray. Will continue to hold his insulin that was due at 17:00.

## 2018-07-11 ENCOUNTER — Encounter (HOSPITAL_COMMUNITY): Payer: Self-pay | Admitting: *Deleted

## 2018-07-11 ENCOUNTER — Other Ambulatory Visit: Payer: Self-pay

## 2018-07-11 ENCOUNTER — Inpatient Hospital Stay (HOSPITAL_COMMUNITY)
Admission: AD | Admit: 2018-07-11 | Discharge: 2018-07-17 | DRG: 885 | Disposition: A | Discharge status: Home / Self Care | Payer: Medicaid Other | Source: Intra-hospital | Attending: Psychiatry | Admitting: Psychiatry

## 2018-07-11 DIAGNOSIS — G4700 Insomnia, unspecified: Secondary | ICD-10-CM | POA: Diagnosis present

## 2018-07-11 DIAGNOSIS — F1410 Cocaine abuse, uncomplicated: Secondary | ICD-10-CM | POA: Diagnosis present

## 2018-07-11 DIAGNOSIS — I10 Essential (primary) hypertension: Secondary | ICD-10-CM | POA: Diagnosis present

## 2018-07-11 DIAGNOSIS — E119 Type 2 diabetes mellitus without complications: Secondary | ICD-10-CM | POA: Diagnosis present

## 2018-07-11 DIAGNOSIS — K219 Gastro-esophageal reflux disease without esophagitis: Secondary | ICD-10-CM | POA: Diagnosis present

## 2018-07-11 DIAGNOSIS — Z79899 Other long term (current) drug therapy: Secondary | ICD-10-CM

## 2018-07-11 DIAGNOSIS — Z801 Family history of malignant neoplasm of trachea, bronchus and lung: Secondary | ICD-10-CM

## 2018-07-11 DIAGNOSIS — F17210 Nicotine dependence, cigarettes, uncomplicated: Secondary | ICD-10-CM | POA: Diagnosis present

## 2018-07-11 DIAGNOSIS — B1920 Unspecified viral hepatitis C without hepatic coma: Secondary | ICD-10-CM | POA: Diagnosis present

## 2018-07-11 DIAGNOSIS — H5789 Other specified disorders of eye and adnexa: Secondary | ICD-10-CM | POA: Diagnosis present

## 2018-07-11 DIAGNOSIS — G2401 Drug induced subacute dyskinesia: Secondary | ICD-10-CM | POA: Diagnosis present

## 2018-07-11 DIAGNOSIS — Z590 Homelessness: Secondary | ICD-10-CM | POA: Diagnosis not present

## 2018-07-11 DIAGNOSIS — Z8249 Family history of ischemic heart disease and other diseases of the circulatory system: Secondary | ICD-10-CM | POA: Diagnosis not present

## 2018-07-11 DIAGNOSIS — Z21 Asymptomatic human immunodeficiency virus [HIV] infection status: Secondary | ICD-10-CM | POA: Diagnosis present

## 2018-07-11 DIAGNOSIS — E7800 Pure hypercholesterolemia, unspecified: Secondary | ICD-10-CM | POA: Diagnosis present

## 2018-07-11 DIAGNOSIS — Z803 Family history of malignant neoplasm of breast: Secondary | ICD-10-CM

## 2018-07-11 DIAGNOSIS — F200 Paranoid schizophrenia: Secondary | ICD-10-CM | POA: Diagnosis not present

## 2018-07-11 DIAGNOSIS — F419 Anxiety disorder, unspecified: Secondary | ICD-10-CM | POA: Diagnosis present

## 2018-07-11 DIAGNOSIS — B20 Human immunodeficiency virus [HIV] disease: Secondary | ICD-10-CM | POA: Diagnosis not present

## 2018-07-11 DIAGNOSIS — F1511 Other stimulant abuse, in remission: Secondary | ICD-10-CM | POA: Diagnosis present

## 2018-07-11 DIAGNOSIS — R45851 Suicidal ideations: Secondary | ICD-10-CM | POA: Diagnosis present

## 2018-07-11 DIAGNOSIS — F1210 Cannabis abuse, uncomplicated: Secondary | ICD-10-CM | POA: Diagnosis present

## 2018-07-11 DIAGNOSIS — Z794 Long term (current) use of insulin: Secondary | ICD-10-CM

## 2018-07-11 DIAGNOSIS — R45850 Homicidal ideations: Secondary | ICD-10-CM | POA: Diagnosis not present

## 2018-07-11 DIAGNOSIS — Z9114 Patient's other noncompliance with medication regimen: Secondary | ICD-10-CM

## 2018-07-11 DIAGNOSIS — F209 Schizophrenia, unspecified: Principal | ICD-10-CM | POA: Diagnosis present

## 2018-07-11 LAB — URINALYSIS, ROUTINE W REFLEX MICROSCOPIC
Bilirubin Urine: NEGATIVE
Glucose, UA: NEGATIVE mg/dL
Hgb urine dipstick: NEGATIVE
Ketones, ur: NEGATIVE mg/dL
Nitrite: POSITIVE — AB
Protein, ur: NEGATIVE mg/dL
Specific Gravity, Urine: 1.011 (ref 1.005–1.030)
pH: 5 (ref 5.0–8.0)

## 2018-07-11 LAB — CBG MONITORING, ED
GLUCOSE-CAPILLARY: 59 mg/dL — AB (ref 70–99)
Glucose-Capillary: 113 mg/dL — ABNORMAL HIGH (ref 70–99)
Glucose-Capillary: 49 mg/dL — ABNORMAL LOW (ref 70–99)
Glucose-Capillary: 175 mg/dL — ABNORMAL HIGH (ref 70–99)
Glucose-Capillary: 51 mg/dL — ABNORMAL LOW (ref 70–99)
Glucose-Capillary: 77 mg/dL (ref 70–99)
Glucose-Capillary: 94 mg/dL (ref 70–99)
Glucose-Capillary: 108 mg/dL — ABNORMAL HIGH (ref 70–99)

## 2018-07-11 LAB — GLUCOSE, CAPILLARY
Glucose-Capillary: 115 mg/dL — ABNORMAL HIGH (ref 70–99)
Glucose-Capillary: 135 mg/dL — ABNORMAL HIGH (ref 70–99)

## 2018-07-11 MED ORDER — GLECAPREVIR-PIBRENTASVIR 100-40 MG PO TABS: 3.0000 | ORAL_TABLET | Freq: Every day | ORAL | Status: DC

## 2018-07-11 MED ORDER — GLUCOSE 40 % PO GEL: 1.0000 | Freq: Once | ORAL | Status: DC

## 2018-07-11 MED ORDER — ALUM & MAG HYDROXIDE-SIMETH 200-200-20 MG/5ML PO SUSP: 30.0000 mL | ORAL | Status: DC | PRN

## 2018-07-11 MED ORDER — TRAZODONE HCL 50 MG PO TABS
50.0000 mg | ORAL_TABLET | Freq: Every evening | ORAL | Status: DC | PRN
  Administered 2018-07-11: 50 mg via ORAL
  Filled 2018-07-11 (×2): qty 1

## 2018-07-11 MED ORDER — CEPHALEXIN 500 MG PO CAPS
500.0000 mg | ORAL_CAPSULE | Freq: Two times a day (BID) | ORAL | Status: DC
  Administered 2018-07-11 – 2018-07-17 (×12): 500 mg via ORAL
  Filled 2018-07-11 (×8): qty 1
  Filled 2018-07-11: qty 9
  Filled 2018-07-11 (×2): qty 1
  Filled 2018-07-11: qty 2
  Filled 2018-07-11 (×2): qty 1
  Filled 2018-07-11: qty 9
  Filled 2018-07-11 (×2): qty 1

## 2018-07-11 MED ORDER — MAGNESIUM HYDROXIDE 400 MG/5ML PO SUSP: 30.0000 mL | Freq: Every day | ORAL | Status: DC | PRN

## 2018-07-11 MED ORDER — HYDROXYZINE HCL 25 MG PO TABS
25.0000 mg | ORAL_TABLET | Freq: Three times a day (TID) | ORAL | Status: DC | PRN
  Administered 2018-07-11 – 2018-07-16 (×5): 25 mg via ORAL
  Filled 2018-07-11 (×6): qty 1

## 2018-07-11 MED ORDER — OLANZAPINE 5 MG PO TABS
5.0000 mg | ORAL_TABLET | Freq: Two times a day (BID) | ORAL | Status: DC
  Administered 2018-07-11 – 2018-07-12 (×2): 5 mg via ORAL
  Filled 2018-07-11: qty 2
  Filled 2018-07-11 (×4): qty 1

## 2018-07-11 MED ORDER — METFORMIN HCL 500 MG PO TABS
500.0000 mg | ORAL_TABLET | Freq: Every day | ORAL | Status: DC
  Administered 2018-07-12 – 2018-07-17 (×6): 500 mg via ORAL
  Filled 2018-07-11 (×8): qty 1

## 2018-07-11 MED ORDER — NICOTINE 21 MG/24HR TD PT24
21.0000 mg | MEDICATED_PATCH | Freq: Every day | TRANSDERMAL | Status: DC
  Administered 2018-07-11 – 2018-07-17 (×7): 21 mg via TRANSDERMAL
  Filled 2018-07-11 (×10): qty 1

## 2018-07-11 MED ORDER — INSULIN ASPART 100 UNIT/ML ~~LOC~~ SOLN: 5.0000 [IU] | Freq: Three times a day (TID) | SUBCUTANEOUS | Status: DC

## 2018-07-11 MED ORDER — INSULIN GLARGINE 100 UNIT/ML ~~LOC~~ SOLN
30.0000 [IU] | Freq: Two times a day (BID) | SUBCUTANEOUS | Status: DC
  Filled 2018-07-11: qty 0.3

## 2018-07-11 MED ORDER — DEXTROSE 50 % IV SOLN
INTRAVENOUS | Status: AC
  Administered 2018-07-11: 50 mL
  Filled 2018-07-11: qty 50

## 2018-07-11 MED ORDER — CEPHALEXIN 500 MG PO CAPS
500.0000 mg | ORAL_CAPSULE | Freq: Two times a day (BID) | ORAL | Status: DC
  Administered 2018-07-11: 500 mg via ORAL
  Filled 2018-07-11: qty 1

## 2018-07-11 MED ORDER — ACETAMINOPHEN 325 MG PO TABS
650.0000 mg | ORAL_TABLET | Freq: Four times a day (QID) | ORAL | Status: DC | PRN
  Administered 2018-07-14: 650 mg via ORAL
  Filled 2018-07-11: qty 2

## 2018-07-11 MED ORDER — INSULIN GLARGINE 100 UNIT/ML ~~LOC~~ SOLN
30.0000 [IU] | Freq: Two times a day (BID) | SUBCUTANEOUS | Status: DC
  Administered 2018-07-12 – 2018-07-17 (×11): 30 [IU] via SUBCUTANEOUS

## 2018-07-11 MED ORDER — INSULIN ASPART 100 UNIT/ML ~~LOC~~ SOLN
5.0000 [IU] | Freq: Three times a day (TID) | SUBCUTANEOUS | Status: DC
  Administered 2018-07-11 – 2018-07-16 (×15): 5 [IU] via SUBCUTANEOUS

## 2018-07-11 MED ORDER — BICTEGRAVIR-EMTRICITAB-TENOFOV 50-200-25 MG PO TABS
1.0000 | ORAL_TABLET | Freq: Every day | ORAL | Status: DC
  Administered 2018-07-12 – 2018-07-17 (×6): 1 via ORAL
  Filled 2018-07-11 (×8): qty 1

## 2018-07-11 MED ORDER — ROSUVASTATIN CALCIUM 5 MG PO TABS
5.0000 mg | ORAL_TABLET | Freq: Every day | ORAL | Status: DC
  Administered 2018-07-12 – 2018-07-17 (×6): 5 mg via ORAL
  Filled 2018-07-11 (×8): qty 1

## 2018-07-11 MED ORDER — GLUCOSE 40 % PO GEL
ORAL | Status: AC
  Filled 2018-07-11: qty 1

## 2018-07-11 NOTE — Consult Note (Addendum)
[facility] ED PROGRESS NOTE   Reason for Consult: psychosis Referring Physician:  Dr/ Francine Graven Patient Identification: Derrick Cooper MRN:  [id number] Principal Diagnosis: Schizophrenia ([place]) Diagnosis:  Principal Problem:   Schizophrenia ([place])   Total Time spent with patient: 30 minutes  Subjective:   Derrick Cooper is a 57 y.o. male patient admitted with SI, HI and psychosis. He reports that he is doing okay today. He slept well overnight and ate breakfast today. He is still agreeable to inpatient psychiatric admission for ongoing psychosis and SI. He denies any side effects with current medications.  Past Psychiatric History: Schizophrenia and marijuana abuse. History of suicide attempt by overdose on medications.   Risk to Self: Suicidal Ideation: Yes-Currently Present Suicidal Intent: Yes-Currently Present Is patient at risk for suicide?: Yes Suicidal Plan?: Yes-Currently Present Specify Current Suicidal Plan: Run into traffic Access to Means: Yes Specify Access to Suicidal Means: Run into traffic What has been your use of drugs/alcohol within the last 12 months?: Current use How many times?: (Multiple) Other Self Harm Risks: NA Triggers for Past Attempts: Unknown Intentional Self Injurious Behavior: None Risk to Others: Homicidal Ideation: No Thoughts of Harm to Others: No Current Homicidal Intent: No Current Homicidal Plan: No Access to Homicidal Means: No Identified Victim: NA History of harm to others?: No Assessment of Violence: None Noted Violent Behavior Description: NA Does patient have access to weapons?: No Criminal Charges Pending?: No Does patient have a court date: No Prior Inpatient Therapy: Prior Inpatient Therapy: Yes Prior Therapy Dates: Pt cannot recall Prior Therapy Facilty/Provider(s): Pt cannot recall Reason for Treatment: MH issues Prior Outpatient Therapy: Prior Outpatient Therapy: Yes Prior Therapy Dates: 2018 Prior Therapy  Facilty/Provider(s): [facility] Reason for Treatment: Med mang Does patient have an ACCT team?: No Does patient have Intensive In-House Services?  : No Does patient have [facility] services? : Yes Does patient have P4CC services?: No  Past Medical History:  Past Medical History:  Diagnosis Date  . Diabetes mellitus without complication ([place])   . Hepatitis C   . HIV infection ([place])   . Schizophrenia ([place])   . Substance abuse ([place])    No past surgical history on file. Family History:  Family History  Problem Relation Age of Onset  . Breast cancer Mother   . Lung cancer Father   . Heart attack Father    Family Psychiatric  History: None per chart review.  Social History:  Social History   Substance and Sexual Activity  Alcohol Use No  . Frequency: Never     Social History   Substance and Sexual Activity  Drug Use Yes  . Types: Marijuana, Methamphetamines   Comment: has not used meth since 03/2017, marijuana once or twice a month    Social History   Socioeconomic History  . Marital status: Single    Spouse name: Not on file  . Number of children: 0  . Years of education: 9  . Highest education level: Not on file  Occupational History  . Not on file  Social Needs  . Financial resource strain: Not hard at all  . Food insecurity:    Worry: Never true    Inability: Never true  . Transportation needs:    Medical: Yes    Non-medical: Not on file  Tobacco Use  . Smoking status: Current Every Day Smoker    Packs/day: 0.50    Years: 40.00    Pack years: 20.00    Types: Cigarettes  .  Smokeless tobacco: Never Used  . Tobacco comment: "just not ready"  Substance and Sexual Activity  . Alcohol use: No    Frequency: Never  . Drug use: Yes    Types: Marijuana, Methamphetamines    Comment: has not used meth since 03/2017, marijuana once or twice a month  . Sexual activity: Never    Comment: declined condoms  Lifestyle  . Physical activity:    Days per week: Not on file     Minutes per session: Not on file  . Stress: Not on file  Relationships  . Social connections:    Talks on phone: Not on file    Gets together: Not on file    Attends religious service: Not on file    Active member of club or organization: Not on file    Attends meetings of clubs or organizations: Not on file    Relationship status: Not on file  Other Topics Concern  . Not on file  Social History Narrative  . Not on file   Additional Social History: N/A    Allergies:  No Known Allergies  Labs:  Results for orders placed or performed during the hospital encounter of 07/09/18 (from the past 48 hour(s))  CBG monitoring, ED     Status: Abnormal   Collection Time: 07/09/18  5:32 PM  Result Value Ref Range   Glucose-Capillary 145 (H) 70 - 99 mg/dL  CBG monitoring, ED     Status: Abnormal   Collection Time: 07/09/18 10:31 PM  Result Value Ref Range   Glucose-Capillary 293 (H) 70 - 99 mg/dL  CBG monitoring, ED     Status: Abnormal   Collection Time: 07/09/18 11:24 PM  Result Value Ref Range   Glucose-Capillary 291 (H) 70 - 99 mg/dL  CBC with Differential     Status: Abnormal   Collection Time: 07/09/18 11:43 PM  Result Value Ref Range   WBC 5.5 4.0 - 10.5 K/uL   RBC 4.49 4.22 - 5.81 MIL/uL   Hemoglobin 13.5 13.0 - 17.0 g/dL   HCT 40.2 39.0 - 52.0 %   MCV 89.5 80.0 - 100.0 fL   MCH 30.1 26.0 - 34.0 pg   MCHC 33.6 30.0 - 36.0 g/dL   RDW 12.5 11.5 - 15.5 %   Platelets 121 (L) 150 - 400 K/uL   nRBC 0.0 0.0 - 0.2 %   Neutrophils Relative % 38 %   Neutro Abs 2.1 1.7 - 7.7 K/uL   Lymphocytes Relative 52 %   Lymphs Abs 2.9 0.7 - 4.0 K/uL   Monocytes Relative 7 %   Monocytes Absolute 0.4 0.1 - 1.0 K/uL   Eosinophils Relative 2 %   Eosinophils Absolute 0.1 0.0 - 0.5 K/uL   Basophils Relative 1 %   Basophils Absolute 0.0 0.0 - 0.1 K/uL   Immature Granulocytes 0 %   Abs Immature Granulocytes 0.01 0.00 - 0.07 K/uL    Comment: Performed at [facility], [place] [address], [place], [place] [address]  Comprehensive metabolic panel     Status: Abnormal   Collection Time: 07/09/18 11:43 PM  Result Value Ref Range   Sodium 135 135 - 145 mmol/L   Potassium 3.0 (L) 3.5 - 5.1 mmol/L   Chloride 104 98 - 111 mmol/L   CO2 26 22 - 32 mmol/L   Glucose, Bld 272 (H) 70 - 99 mg/dL   BUN 21 (H) 6 - 20 mg/dL   Creatinine, Ser 0.79 0.61 - 1.24  mg/dL   Calcium 8.3 (L) 8.9 - 10.3 mg/dL   Total Protein 6.1 (L) 6.5 - 8.1 g/dL   Albumin 3.0 (L) 3.5 - 5.0 g/dL   AST 30 15 - 41 U/L   ALT 15 0 - 44 U/L   Alkaline Phosphatase 62 38 - 126 U/L   Total Bilirubin 1.2 0.3 - 1.2 mg/dL   GFR calc non Af Amer >60 >60 mL/min   GFR calc Af Amer >60 >60 mL/min   Anion gap 5 5 - 15    Comment: Performed at [facility], [place] [address] [place], [place] [address]  Magnesium     Status: None   Collection Time: 07/09/18 11:43 PM  Result Value Ref Range   Magnesium 1.8 1.7 - 2.4 mg/dL    Comment: Performed at [facility], [place] [address], [place], [place] [address]  Troponin I - ONCE - STAT     Status: None   Collection Time: 07/09/18 11:43 PM  Result Value Ref Range   Troponin I <0.03 <0.03 ng/mL    Comment: Performed at [facility], [place] [address] [place], [place] [address]  CBG monitoring, ED     Status: None   Collection Time: 07/10/18  8:32 AM  Result Value Ref Range   Glucose-Capillary 82 70 - 99 mg/dL  CBG monitoring, ED     Status: Abnormal   Collection Time: 07/10/18 12:09 PM  Result Value Ref Range   Glucose-Capillary 43 (LL) 70 - 99 mg/dL   Comment 1 Notify RN   CBG monitoring, ED     Status: Abnormal   Collection Time: 07/10/18 12:51 PM  Result Value Ref Range   Glucose-Capillary 59 (L) 70 - 99 mg/dL   Comment 1 Notify RN   CBG monitoring, ED     Status: None   Collection Time: 07/10/18  1:12 PM  Result Value Ref Range   Glucose-Capillary 96 70 - 99 mg/dL  CBG monitoring, ED     Status: Abnormal    Collection Time: 07/10/18  2:15 PM  Result Value Ref Range   Glucose-Capillary 147 (H) 70 - 99 mg/dL   Comment 1 Notify RN   CBG monitoring, ED     Status: Abnormal   Collection Time: 07/10/18  6:22 PM  Result Value Ref Range   Glucose-Capillary 60 (L) 70 - 99 mg/dL  CBG monitoring, ED     Status: Abnormal   Collection Time: 07/10/18  6:46 PM  Result Value Ref Range   Glucose-Capillary 68 (L) 70 - 99 mg/dL   Comment 1 Notify RN   CBG monitoring, ED     Status: Abnormal   Collection Time: 07/10/18  7:07 PM  Result Value Ref Range   Glucose-Capillary 131 (H) 70 - 99 mg/dL   Comment 1 Notify RN   CBG monitoring, ED     Status: Abnormal   Collection Time: 07/10/18  8:11 PM  Result Value Ref Range   Glucose-Capillary 143 (H) 70 - 99 mg/dL  CBG monitoring, ED     Status: Abnormal   Collection Time: 07/11/18  1:52 AM  Result Value Ref Range   Glucose-Capillary 49 (L) 70 - 99 mg/dL   Comment 1 Notify RN    Comment 2 Document in Chart   CBG monitoring, ED     Status: Abnormal   Collection Time: 07/11/18  2:22 AM  Result Value Ref Range   Glucose-Capillary 113 (H) 70 - 99 mg/dL  Comment 1 Notify RN    Comment 2 Document in Chart   CBG monitoring, ED     Status: Abnormal   Collection Time: 07/11/18  5:29 AM  Result Value Ref Range   Glucose-Capillary 175 (H) 70 - 99 mg/dL   Comment 1 Notify RN    Comment 2 Document in Chart   CBG monitoring, ED     Status: Abnormal   Collection Time: 07/11/18  8:41 AM  Result Value Ref Range   Glucose-Capillary 51 (L) 70 - 99 mg/dL  POC CBG, ED     Status: Abnormal   Collection Time: 07/11/18  9:14 AM  Result Value Ref Range   Glucose-Capillary 59 (L) 70 - 99 mg/dL  POC CBG, ED     Status: None   Collection Time: 07/11/18  9:53 AM  Result Value Ref Range   Glucose-Capillary 77 70 - 99 mg/dL  Urinalysis, Routine w reflex microscopic     Status: Abnormal   Collection Time: 07/11/18 10:13 AM  Result Value Ref Range   Color, Urine YELLOW  YELLOW   APPearance CLEAR CLEAR   Specific Gravity, Urine 1.011 1.005 - 1.030   pH 5.0 5.0 - 8.0   Glucose, UA NEGATIVE NEGATIVE mg/dL   Hgb urine dipstick NEGATIVE NEGATIVE   Bilirubin Urine NEGATIVE NEGATIVE   Ketones, ur NEGATIVE NEGATIVE mg/dL   Protein, ur NEGATIVE NEGATIVE mg/dL   Nitrite POSITIVE (A) NEGATIVE   Leukocytes, UA TRACE (A) NEGATIVE   RBC / HPF 0-5 0 - 5 RBC/hpf   WBC, UA 11-20 0 - 5 WBC/hpf   Bacteria, UA FEW (A) NONE SEEN   Mucus PRESENT     Comment: Performed at [facility], [place] [address], [place], [place] [address]    Current Facility-Administered Medications  Medication Dose Route Frequency Provider Last Rate Last Dose  . bictegravir-emtricitabine-tenofovir AF (BIKTARVY) 50-200-25 MG per tablet 1 tablet  1 tablet Oral Daily Patrecia Pour, NP   1 tablet at 07/11/18 1113  . cephALEXin (KEFLEX) capsule 500 mg  500 mg Oral BID Quintella Reichert, MD      . dextrose (GLUTOSE) 40 % oral gel 37.5 g  1 Tube Oral Once Quintella Reichert, MD   Stopped at 07/11/18 0945  . Glecaprevir-Pibrentasvir 100-40 MG TABS 3 tablet  3 tablet Oral Daily Francine Graven, DO   3 tablet at 07/10/18 1010  . insulin aspart (novoLOG) injection 5 Units  5 Units Subcutaneous TID WC Quintella Reichert, MD      . insulin glargine (LANTUS) injection 30 Units  30 Units Subcutaneous BID Quintella Reichert, MD      . metFORMIN (GLUCOPHAGE) tablet 500 mg  500 mg Oral Q breakfast Patrecia Pour, NP   500 mg at 07/10/18 0848  . OLANZapine (ZYPREXA) tablet 5 mg  5 mg Oral BID Patrecia Pour, NP   5 mg at 07/11/18 1111  . rosuvastatin (CRESTOR) tablet 5 mg  5 mg Oral Daily Patrecia Pour, NP   5 mg at 07/11/18 1112   Current Outpatient Medications  Medication Sig Dispense Refill  . bictegravir-emtricitabine-tenofovir AF (BIKTARVY) 50-200-25 MG TABS tablet Take 1 tablet by mouth daily. 30 tablet 2  . Glecaprevir-Pibrentasvir (MAVYRET) 100-40 MG TABS Take 3 tablets by mouth daily. 84  tablet 1  . HUMALOG KWIKPEN 100 UNIT/ML KiwkPen inject 15 units subcutaneously three times a day before meals  0  . LANTUS SOLOSTAR 100 UNIT/ML Solostar Pen inject 60 units subcutaneously twice  a day  0  . lisinopril (PRINIVIL,ZESTRIL) 5 MG tablet Take 5 mg by mouth daily.  5  . metFORMIN (GLUCOPHAGE) 500 MG tablet Take 500 mg by mouth daily with breakfast.     . rosuvastatin (CRESTOR) 5 MG tablet Take 1 tablet (5 mg total) by mouth daily. 30 tablet 1  . ACCU-CHEK AVIVA PLUS test strip 3 (three) times daily. as directed  1  . ACCU-CHEK SOFTCLIX LANCETS lancets 3 (three) times daily. as directed  1  . B-D UF III MINI PEN NEEDLES 31G X 5 MM MISC USE AS DIRECTED QID  1  . Blood Glucose Monitoring Suppl (ACCU-CHEK AVIVA PLUS) w/Device KIT 3 (three) times daily. as directed  0  . Insulin Pen Needle (PEN NEEDLES 3/16") 31G X 5 MM MISC by Does not apply route.      Musculoskeletal: Strength & Muscle Tone: within normal limits Gait & Station: normal Patient leans: N/A  Psychiatric Specialty Exam: Physical Exam  Nursing note and vitals reviewed. Constitutional: He is oriented to person, place, and time. He appears well-developed and well-nourished.  HENT:  Head: Normocephalic and atraumatic.  Eyes:  Left eye contains a patch.   Neck: Normal range of motion.  Musculoskeletal: Normal range of motion.  Neurological: He is alert and oriented to person, place, and time.  Skin: Skin is warm.  Psychiatric: His speech is normal and behavior is normal. His affect is blunt. Thought content is delusional. Cognition and memory are impaired. He expresses impulsivity.    Review of Systems  Psychiatric/Behavioral: Positive for hallucinations (AH) and suicidal ideas.  All other systems reviewed and are negative.   Blood pressure 120/76, pulse 79, temperature 98.2 F (36.8 C), temperature source Oral, resp. rate 16, SpO2 96 %.There is no height or weight on file to calculate BMI.  General Appearance:  Fairly Groomed, middle aged, African American male, wearing paper hospital scrubs with a left eye patch who is lying in bed. NAD.   Eye Contact:  Fair  Speech:  Clear and Coherent and Slow  Volume:  Normal  Mood:  Dysphoric  Affect:  Flat  Thought Process:  Linear and Descriptions of Associations: Intact  Orientation:  Full (Time, Place, and Person)  Thought Content:  Hallucinations: Auditory, Paranoid Ideation and Rumination  Suicidal Thoughts:  Yes.  with intent/plan  Homicidal Thoughts:  Yes.  with intent/plan  Memory:  Immediate;   Fair Recent;   Fair Remote;   Fair  Judgement:  Impaired  Insight:  Lacking  Psychomotor Activity:  Normal  Concentration:  Concentration: Fair and Attention Span: Fair  Recall:  [employer] of Knowledge:  Fair  Language:  Fair  Akathisia:  No  Handed:  Right  AIMS (if indicated):   N/A  Assets:  Communication Skills Desire for Improvement Financial Resources/Insurance Leisure Time Social Support  ADL's:  Intact  Cognition:  WNL  Sleep:   N/A   Assessment:  Derrick Cooper is a 57 y.o. male who was admitted with SI, HI and psychosis. He continues to warrant inpatient psychiatric hospitalization for active psychosis.   Treatment Plan Summary: Daily contact with patient to assess and evaluate symptoms and progress in treatment and Medication management  -Continue Zyprexa 5 mg BID for psychosis and mood stabilization.    Disposition: Recommend psychiatric Inpatient admission when medically cleared.  Faythe Dingwall, DO 07/11/2018 12:02 PM

## 2018-07-11 NOTE — ED Notes (Signed)
[facility] AWARE OF NEW INSULIN ORDERS. CAUTION ON LOW CBG. HOME MED PENDING. DX UTI AND MEDICATION GIVEN STARTED TODAY. DIABETIC COORDINATOR # [phone].

## 2018-07-11 NOTE — Progress Notes (Addendum)
Pt was observed in the room, seen resting in bed with eyes open. Pt attended wrap-up group this evening. Pt denies SI/VH/Pain. Endorses AH stating "I hear something telling me to burn myself". Pt endorses HI towards a man he did not identify and/or elaborate.Pt was given toiletries and oriented to the unit. Pt states he hopes to get sleep and maintain CBG while here. No new c/o's. HFR maintained due to unsteady gait. PRN vistaril and trazodone offered; Pt accepted. Med education print-out on Zyprexa requested and given.Support and encouragement offered. Will continue with POC.

## 2018-07-11 NOTE — ED Provider Notes (Addendum)
Patient with DM on insulin, episodes of hypoglycemia.  Will decrease lantus to 30 units bid and novolog to 5 units tid.     Tilden Fossaees, Fallynn Gravett, MD 07/11/18 1015   UA concerning for possible UTI.  Will add culture and start on Abx.     Tilden Fossaees, Romaine Maciolek, MD 07/11/18 1106

## 2018-07-11 NOTE — ED Notes (Signed)
CBG 108. 

## 2018-07-11 NOTE — ED Notes (Signed)
POST SNACK AND ADDITIONAL COFFEE WITH SUGAR CBG 59. PT'S BREAKFAST HAS JUST ARRIVED. PT ALERT AND ACTIVE WITH CARE. PT VERBALIZED WITH EAT

## 2018-07-11 NOTE — BH Assessment (Signed)
[facility]  Per Juanetta BeetsJacqueline Norman, DO, this pt requires psychiatric hospitalization at this time.  Berneice Heinrichina Tate, RN, [facility].  Pt has signed Voluntary Admission and Consent for Treatment, as well as Consent to Release Information to no one, and signed forms [facility]  Pt's nurse, Morrie Sheldonshley, has been notified, and agrees to send original paperwork along with pt via [facility] [place], and to call report to [phone].  Doylene Canninghomas Winda Summerall, [place] Behavioral Health Coordinator [phone]

## 2018-07-11 NOTE — Progress Notes (Signed)
Adult Psychoeducational Group Note  Date:  07/11/2018 Time:  8:41 PM  Group Topic/Focus:  Wrap-Up Group:   The focus of this group is to help patients review their daily goal of treatment and discuss progress on daily workbooks.  Participation Level:  Active  Participation Quality:  Appropriate  Affect:  Appropriate  Cognitive:  Alert  Insight: Appropriate  Engagement in Group:  Engaged  Modes of Intervention:  Discussion  Additional Comments:  Pt is a new admit to the unit. Pt stated that his goal is to get his medication adjusted and be able to control the voices in his head.   Kaleen OdeaCOOKE, Luchiano Viscomi R 07/11/2018, 8:41 PM

## 2018-07-11 NOTE — ED Notes (Signed)
PT STATES ALL OF HIS MEDICATIONS ARE IN HIS SUITCASE WHICH HAS BEEN BROUGHT TO HIS BEDSIDE FOR TRANSFER TO [facility]. THIS WRITER HAS NOT GONE THROUGH FOR VERIFICATION. SITTER PRESENT TO ASSURE SAFETY

## 2018-07-11 NOTE — ED Notes (Signed)
TTS AT BEDSIDE 

## 2018-07-11 NOTE — ED Notes (Signed)
CBG 51 ORANGE JUICE GIVEN, CHEESE (2) STICKS AND PEANUT BUTTER GIVEN. PT ALERT AND COOPERATIVE WITH CARE.

## 2018-07-11 NOTE — Tx Team (Signed)
Initial Treatment Plan 07/11/2018 5:00 PM Nicanor BakeKent L Tippett ZOX:[id number]    PATIENT STRESSORS: Health problems Marital or family conflict Medication change or noncompliance Substance abuse   PATIENT STRENGTHS: Ability for insight Average or above average intelligence Capable of independent living General fund of knowledge   PATIENT IDENTIFIED PROBLEMS: Depression Suicidal thoughts A/V hallucinations  "Get stabilized on some medicine"                     DISCHARGE CRITERIA:  Ability to meet basic life and health needs Improved stabilization in mood, thinking, and/or behavior Reduction of life-threatening or endangering symptoms to within safe limits Verbal commitment to aftercare and medication compliance  PRELIMINARY DISCHARGE PLAN: Attend aftercare/continuing care group  PATIENT/FAMILY INVOLVEMENT: This treatment plan has been presented to and reviewed with the patient, Nicanor BakeKent L Housewright, and/or family member, .  The patient and family have been given the opportunity to ask questions and make suggestions.  Terria Deschepper, [place], [place]RN 07/11/2018, 5:00 PM

## 2018-07-11 NOTE — ED Notes (Signed)
ED TO INPATIENT HANDOFF REPORT  Name/Age/Gender [name] 57 y.o. male  Code Status    Code Status Orders  (From admission, onward)         Start     Ordered   07/09/18 1401  Full code  Continuous     07/09/18 1400        Code Status History    This patient has a current code status but no historical code status.      Home/SNF/Other Home  Chief Complaint off medications  Level of Care/Admitting Diagnosis ED Disposition    ED Disposition Condition Comment   Transfer to Another Facility  To [facility]      Medical History Past Medical History:  Diagnosis Date  . Diabetes mellitus without complication (HCC)   . Hepatitis C   . HIV infection (HCC)   . Schizophrenia (HCC)   . Substance abuse (HCC)     Allergies No Known Allergies  IV Location/Drains/Wounds Patient Lines/Drains/Airways Status   Active Line/Drains/Airways    Name:   Placement date:   Placement time:   Site:   Days:   Peripheral IV 07/09/18 Left;Upper Arm   07/09/18    2321    Arm   2          Labs/Imaging Results for orders placed or performed during the hospital encounter of 07/09/18 (from the past 48 hour(s))  CBG monitoring, ED     Status: Abnormal   Collection Time: 07/09/18  5:32 PM  Result Value Ref Range   Glucose-Capillary 145 (H) 70 - 99 mg/dL  CBG monitoring, ED     Status: Abnormal   Collection Time: 07/09/18 10:31 PM  Result Value Ref Range   Glucose-Capillary 293 (H) 70 - 99 mg/dL  CBG monitoring, ED     Status: Abnormal   Collection Time: 07/09/18 11:24 PM  Result Value Ref Range   Glucose-Capillary 291 (H) 70 - 99 mg/dL  CBC with Differential     Status: Abnormal   Collection Time: 07/09/18 11:43 PM  Result Value Ref Range   WBC 5.5 4.0 - 10.5 K/uL   RBC 4.49 4.22 - 5.81 MIL/uL   Hemoglobin 13.5 13.0 - 17.0 g/dL   HCT 16.1 09.6 - 04.5 %   MCV 89.5 80.0 - 100.0 fL   MCH 30.1 26.0 - 34.0 pg   MCHC 33.6 30.0 - 36.0 g/dL   RDW 40.9 81.1 - 91.4 %   Platelets 121  (L) 150 - 400 K/uL   nRBC 0.0 0.0 - 0.2 %   Neutrophils Relative % 38 %   Neutro Abs 2.1 1.7 - 7.7 K/uL   Lymphocytes Relative 52 %   Lymphs Abs 2.9 0.7 - 4.0 K/uL   Monocytes Relative 7 %   Monocytes Absolute 0.4 0.1 - 1.0 K/uL   Eosinophils Relative 2 %   Eosinophils Absolute 0.1 0.0 - 0.5 K/uL   Basophils Relative 1 %   Basophils Absolute 0.0 0.0 - 0.1 K/uL   Immature Granulocytes 0 %   Abs Immature Granulocytes 0.01 0.00 - 0.07 K/uL    Comment: Performed at [facility], [address], [place], [place] [address]  Comprehensive metabolic panel     Status: Abnormal   Collection Time: 07/09/18 11:43 PM  Result Value Ref Range   Sodium 135 135 - 145 mmol/L   Potassium 3.0 (L) 3.5 - 5.1 mmol/L   Chloride 104 98 - 111 mmol/L   CO2 26 22 -  32 mmol/L   Glucose, Bld 272 (H) 70 - 99 mg/dL   BUN 21 (H) 6 - 20 mg/dL   Creatinine, Ser 1.61 0.61 - 1.24 mg/dL   Calcium 8.3 (L) 8.9 - 10.3 mg/dL   Total Protein 6.1 (L) 6.5 - 8.1 g/dL   Albumin 3.0 (L) 3.5 - 5.0 g/dL   AST 30 15 - 41 U/L   ALT 15 0 - 44 U/L   Alkaline Phosphatase 62 38 - 126 U/L   Total Bilirubin 1.2 0.3 - 1.2 mg/dL   GFR calc non Af Amer >60 >60 mL/min   GFR calc Af Amer >60 >60 mL/min   Anion gap 5 5 - 15    Comment: Performed at [facility], [address] [place], [place] [address]  Magnesium     Status: None   Collection Time: 07/09/18 11:43 PM  Result Value Ref Range   Magnesium 1.8 1.7 - 2.4 mg/dL    Comment: Performed at [facility], [address], [place], [place] [address]  Troponin I - ONCE - STAT     Status: None   Collection Time: 07/09/18 11:43 PM  Result Value Ref Range   Troponin I <0.03 <0.03 ng/mL    Comment: Performed at [facility], [address], [place], [place] [address]  CBG monitoring, ED     Status: None   Collection Time: 07/10/18  8:32 AM  Result Value Ref Range   Glucose-Capillary 82 70 - 99 mg/dL  CBG  monitoring, ED     Status: Abnormal   Collection Time: 07/10/18 12:09 PM  Result Value Ref Range   Glucose-Capillary 43 (LL) 70 - 99 mg/dL   Comment 1 Notify RN   CBG monitoring, ED     Status: Abnormal   Collection Time: 07/10/18 12:51 PM  Result Value Ref Range   Glucose-Capillary 59 (L) 70 - 99 mg/dL   Comment 1 Notify RN   CBG monitoring, ED     Status: None   Collection Time: 07/10/18  1:12 PM  Result Value Ref Range   Glucose-Capillary 96 70 - 99 mg/dL  CBG monitoring, ED     Status: Abnormal   Collection Time: 07/10/18  2:15 PM  Result Value Ref Range   Glucose-Capillary 147 (H) 70 - 99 mg/dL   Comment 1 Notify RN   CBG monitoring, ED     Status: Abnormal   Collection Time: 07/10/18  6:22 PM  Result Value Ref Range   Glucose-Capillary 60 (L) 70 - 99 mg/dL  CBG monitoring, ED     Status: Abnormal   Collection Time: 07/10/18  6:46 PM  Result Value Ref Range   Glucose-Capillary 68 (L) 70 - 99 mg/dL   Comment 1 Notify RN   CBG monitoring, ED     Status: Abnormal   Collection Time: 07/10/18  7:07 PM  Result Value Ref Range   Glucose-Capillary 131 (H) 70 - 99 mg/dL   Comment 1 Notify RN   CBG monitoring, ED     Status: Abnormal   Collection Time: 07/10/18  8:11 PM  Result Value Ref Range   Glucose-Capillary 143 (H) 70 - 99 mg/dL  CBG monitoring, ED     Status: Abnormal   Collection Time: 07/11/18  1:52 AM  Result Value Ref Range   Glucose-Capillary 49 (L) 70 - 99 mg/dL   Comment 1 Notify RN    Comment 2 Document in Chart   CBG monitoring,  ED     Status: Abnormal   Collection Time: 07/11/18  2:22 AM  Result Value Ref Range   Glucose-Capillary 113 (H) 70 - 99 mg/dL   Comment 1 Notify RN    Comment 2 Document in Chart   CBG monitoring, ED     Status: Abnormal   Collection Time: 07/11/18  5:29 AM  Result Value Ref Range   Glucose-Capillary 175 (H) 70 - 99 mg/dL   Comment 1 Notify RN    Comment 2 Document in Chart   CBG monitoring, ED     Status: Abnormal    Collection Time: 07/11/18  8:41 AM  Result Value Ref Range   Glucose-Capillary 51 (L) 70 - 99 mg/dL  POC CBG, ED     Status: Abnormal   Collection Time: 07/11/18  9:14 AM  Result Value Ref Range   Glucose-Capillary 59 (L) 70 - 99 mg/dL  POC CBG, ED     Status: None   Collection Time: 07/11/18  9:53 AM  Result Value Ref Range   Glucose-Capillary 77 70 - 99 mg/dL  Urinalysis, Routine w reflex microscopic     Status: Abnormal   Collection Time: 07/11/18 10:13 AM  Result Value Ref Range   Color, Urine YELLOW YELLOW   APPearance CLEAR CLEAR   Specific Gravity, Urine 1.011 1.005 - 1.030   pH 5.0 5.0 - 8.0   Glucose, UA NEGATIVE NEGATIVE mg/dL   Hgb urine dipstick NEGATIVE NEGATIVE   Bilirubin Urine NEGATIVE NEGATIVE   Ketones, ur NEGATIVE NEGATIVE mg/dL   Protein, ur NEGATIVE NEGATIVE mg/dL   Nitrite POSITIVE (A) NEGATIVE   Leukocytes, UA TRACE (A) NEGATIVE   RBC / HPF 0-5 0 - 5 RBC/hpf   WBC, UA 11-20 0 - 5 WBC/hpf   Bacteria, UA FEW (A) NONE SEEN   Mucus PRESENT     Comment: Performed at [facility], [address] [place], [place] [id number]  CBG monitoring, ED     Status: None   Collection Time: 07/11/18 12:15 PM  Result Value Ref Range   Glucose-Capillary 94 70 - 99 mg/dL   No results found. EKG Interpretation  Date/Time:  Monday July 09 2018 23:21:11 EST Ventricular Rate:  75 PR Interval:    QRS Duration: 133 QT Interval:  401 QTC Calculation: 448 R Axis:   55 Text Interpretation:  Sinus rhythm Nonspecific intraventricular conduction delay Probable anteroseptal infarct, recent Nonspecific ST abnormality No old tracing to compare Confirmed by Devoria AlbeKnapp, Iva ([id number]) on 07/09/2018 11:24:33 PM Also confirmed by Devoria AlbeKnapp, Iva ([id number]), editor Elita QuickWatlington, Beverly ([id number])  on 07/10/2018 7:33:30 AM   Pending Labs Unresulted Labs (From admission, onward)    Start     Ordered   07/11/18 1106  Urine culture  ONCE - STAT,   STAT     07/11/18 1105           Vitals/Pain Today's Vitals   07/10/18 1900 07/11/18 0532 07/11/18 1218 07/11/18 1347  BP:  120/76 120/85   Pulse:  79 79   Resp:  16 16   Temp:  98.2 F (36.8 C) 98 F (36.7 C)   TempSrc:  Oral Oral   SpO2:  96% 97%   PainSc: 0-No pain   0-No pain    Isolation Precautions No active isolations  Medications Medications  bictegravir-emtricitabine-tenofovir AF (BIKTARVY) 50-200-25 MG per tablet 1 tablet (1 tablet Oral Given 07/11/18 1113)  metFORMIN (GLUCOPHAGE) tablet 500 mg (500 mg Oral Not Given 07/11/18 0843)  rosuvastatin (CRESTOR) tablet 5 mg (5 mg Oral Given 07/11/18 1112)  Glecaprevir-Pibrentasvir 100-40 MG TABS 3 tablet (3 tablets Oral Not Given 07/11/18 1122)  OLANZapine (ZYPREXA) tablet 5 mg (5 mg Oral Given 07/11/18 1111)  dextrose (GLUTOSE) 40 % oral gel 37.5 g (0 Tubes Oral Hold 07/11/18 0945)  insulin aspart (novoLOG) injection 5 Units (5 Units Subcutaneous Not Given 07/11/18 1247)  insulin glargine (LANTUS) injection 30 Units (has no administration in time range)  cephALEXin (KEFLEX) capsule 500 mg (500 mg Oral Given 07/11/18 1415)  potassium chloride SA (K-DUR,KLOR-CON) CR tablet 40 mEq (40 mEq Oral Given 07/09/18 1311)  sodium chloride 0.9 % bolus 1,000 mL (0 mLs Intravenous Stopped 07/10/18 0031)  ondansetron (ZOFRAN) injection 4 mg (4 mg Intravenous Given 07/10/18 0002)  potassium chloride SA (K-DUR,KLOR-CON) CR tablet 40 mEq (40 mEq Oral Given 07/11/18 1111)  sodium chloride 0.9 % bolus 1,000 mL (0 mLs Intravenous Stopped 07/10/18 0154)  sodium chloride 0.9 % bolus 1,000 mL (0 mLs Intravenous Stopped 07/10/18 0302)  loperamide (IMODIUM) capsule 2 mg (2 mg Oral Given 07/10/18 1417)  dextrose 50 % solution (50 mLs  Given 07/11/18 0155)    Mobility walks

## 2018-07-11 NOTE — Progress Notes (Signed)
Inpatient Diabetes Program Recommendations  AACE/ADA: New Consensus Statement on Inpatient Glycemic Control (2015)  Target Ranges:  Prepandial:   less than 140 mg/dL      Peak postprandial:   less than 180 mg/dL (1-2 hours)      Critically ill patients:  140 - 180 mg/dL   Lab Results  Component Value Date   GLUCAP 77 07/11/2018    Review of Glycemic Control Results for Derrick Cooper, Derrick Cooper (MRN [id number]) as of 07/11/2018 10:13  Ref. Range 07/10/2018 20:11 07/11/2018 01:52 07/11/2018 02:22 07/11/2018 05:29 07/11/2018 08:41 07/11/2018 09:14 07/11/2018 09:53  Glucose-Capillary Latest Ref Range: 70 - 99 mg/dL 782143 (H) 49 (Cooper) 956113 (H) 175 (H) 51 (Cooper) 59 (Cooper) 77   Diabetes history: DM 2 Outpatient Diabetes medications: Lantus 60 units bid, Novolog 15 units tid with meals, Metformin 500 mg daily Current orders for Inpatient glycemic control:  Lantus 60 units bid, Novolog 15 units tid with meals  Metformin 500 mg q AM Inpatient Diabetes Program Recommendations:   Please consider holding Lantus this morning. Also consider reducing Lantus to 30 units bid and reduce Novolog to 5 units tid with meals. Secure chat sent to MD.  Thanks,  Beryl MeagerJenny Quatavious Rossa, RN, BC-ADM Inpatient Diabetes Coordinator Pager [phone] (8a-5p)

## 2018-07-11 NOTE — Progress Notes (Signed)
Derrick Cooper is a 57 year old male pt admitted on voluntary basis. On admission, he does endorse recent suicidal thoughts but denies any currently and is able to contract for safety while in the hospital. He does endorse A/V hallucinations and reports that it comes and goes. He reports he was going to [facility] for medications but reports someone told him it was run by the [employer] and they were doing experiments so he stopped going because he could not trust them. He reports being on seroquel but reports that it wasn't helpful. He does endorse marijuana usage but denies any other substance abuse issues. He reports he was living with his sister, reports they got into an argument, he said he was going to burn the building down and she in turn called the police. He reports that he has [place]. He reports medical issues as HIV positive, insulin dependent diabetic, and urinary incontinence. He reports that he is unsure of where he will go once he is discharged. He was escorted to the unit, oriented to the milieu and safety maintained.

## 2018-07-12 DIAGNOSIS — R45850 Homicidal ideations: Secondary | ICD-10-CM

## 2018-07-12 DIAGNOSIS — B20 Human immunodeficiency virus [HIV] disease: Secondary | ICD-10-CM

## 2018-07-12 DIAGNOSIS — G4700 Insomnia, unspecified: Secondary | ICD-10-CM

## 2018-07-12 DIAGNOSIS — F200 Paranoid schizophrenia: Secondary | ICD-10-CM

## 2018-07-12 LAB — GLUCOSE, CAPILLARY
Glucose-Capillary: 174 mg/dL — ABNORMAL HIGH (ref 70–99)
Glucose-Capillary: 156 mg/dL — ABNORMAL HIGH (ref 70–99)

## 2018-07-12 MED ORDER — TEMAZEPAM 15 MG PO CAPS
30.0000 mg | ORAL_CAPSULE | Freq: Every day | ORAL | Status: DC
  Administered 2018-07-12 – 2018-07-16 (×5): 30 mg via ORAL
  Filled 2018-07-12 (×5): qty 2

## 2018-07-12 MED ORDER — METOPROLOL SUCCINATE ER 50 MG PO TB24
100.0000 mg | ORAL_TABLET | Freq: Every day | ORAL | Status: DC
  Administered 2018-07-12 – 2018-07-14 (×3): 100 mg via ORAL
  Filled 2018-07-12 (×4): qty 1
  Filled 2018-07-12: qty 2
  Filled 2018-07-12: qty 1
  Filled 2018-07-12 (×3): qty 2

## 2018-07-12 MED ORDER — CLONIDINE HCL 0.1 MG PO TABS: 0.2000 mg | ORAL_TABLET | ORAL | Status: DC | PRN

## 2018-07-12 MED ORDER — PERPHENAZINE 4 MG PO TABS
4.0000 mg | ORAL_TABLET | Freq: Three times a day (TID) | ORAL | Status: DC
  Administered 2018-07-12 (×2): 4 mg via ORAL
  Filled 2018-07-12 (×6): qty 1

## 2018-07-12 MED ORDER — BENZTROPINE MESYLATE 0.5 MG PO TABS
0.5000 mg | ORAL_TABLET | Freq: Two times a day (BID) | ORAL | Status: DC
  Administered 2018-07-12 – 2018-07-17 (×11): 0.5 mg via ORAL
  Filled 2018-07-12 (×15): qty 1

## 2018-07-12 NOTE — [facility] Suicide Risk Assessment (Signed)
[facility]   Nursing information obtained from:  Patient Demographic factors:  Male, Low socioeconomic status, Living alone, Unemployed Current Mental Status:  NA Loss Factors:  Decline in physical health, Financial problems / change in socioeconomic status Historical Factors:  Family history of mental illness or substance abuse Risk Reduction Factors:  Positive coping skills or problem solving skills  Total Time spent with patient: 45 minutes Principal Problem: <principal problem not specified> Diagnosis:  Active Problems:   Schizophrenia (HCC)   Continued Clinical Symptoms:  Alcohol Use Disorder Identification Test Final Score (AUDIT): 1 The "Alcohol Use Disorders Identification Test", Guidelines for Use in Primary Care, Second Edition.  World Science writerHealth Organization [facility](WHO). Score between 0-7:  no or low risk or alcohol related problems. Score between 8-15:  moderate risk of alcohol related problems. Score between 16-19:  high risk of alcohol related problems. Score 20 or above:  warrants further diagnostic evaluation for alcohol dependence and treatment.   CLINICAL FACTORS:   Schizophrenia:   Command hallucinatons  Musculoskeletal: Strength & Muscle Tone: within normal limits Gait & Station: normal Patient leans: N/A  Psychiatric Specialty Exam: Physical Exam  ROS  Blood pressure (!) 160/95, pulse (!) 105, temperature 98.5 F (36.9 C), temperature source Oral, resp. rate 18, height 6\' 2"  (1.88 m), weight 65.3 kg.Body mass index is 18.49 kg/m.  General Appearance: Casual  Eye Contact:  Fair  Speech:  Clear and Coherent  Volume:  Normal  Mood:  Anxious and Depressed  Affect:  Congruent  Thought Process:  Linear  Orientation:  Full (Time, Place, and Person)  Thought Content:  Logical, Delusions and Hallucinations: Auditory  Suicidal Thoughts:  Yes.  without intent/plan recent neg now  Homicidal Thoughts:  No  Memory:  Immediate;   Fair   Judgement:  Fair  Insight:  Fair  Psychomotor Activity:  TD  Concentration:  Concentration: Good  Recall:  Good  Fund of Knowledge:  Good  Language:  Good  Akathisia:  Negative  Handed:  Right  AIMS (if indicated):   20/Roderick Sweezy 12/12  Assets:  Communication Skills Desire for Improvement  ADL's:  Intact  Cognition:  WNL  Sleep:  Number of Hours: 6      COGNITIVE FEATURES THAT CONTRIBUTE TO RISK:  None    SUICIDE RISK:   Minimal: No identifiable suicidal ideation.  Patients presenting with no risk factors but with morbid ruminations; may be classified as minimal risk based on the severity of the depressive symptoms  PLAN OF CARE: anti-Psychotic therapy reality based therapy  I certify that inpatient services furnished can reasonably be expected to improve the patient's condition.   Malvin JohnsFARAH,Elsia Lasota, MD 07/12/2018, 10:36 AM

## 2018-07-12 NOTE — [facility] Suicide Risk Assessment (Signed)
[facility] INPATIENT:  Family/Significant Other Suicide Prevention Education  Suicide Prevention Education:  Contact Attempts: sister, Avanell ShackletonLoria Locatelli [phone], has been identified by the patient as the family member/significant other with whom the patient will be residing, and identified as the person(s) who will aid the patient in the event of a mental health crisis.  With written consent from the patient, two attempts were made to provide suicide prevention education, prior to and/or following the patient's discharge.  We were unsuccessful in providing suicide prevention education.  A suicide education pamphlet was given to the patient to share with family/significant other.  Date and time of first attempt:07/12/18 / 12:20  PM Derrick Cooper, MSW Intern CSW Department 07/12/2018, 12:24 PM

## 2018-07-12 NOTE — Progress Notes (Signed)
Recreation Therapy Notes  INPATIENT RECREATION THERAPY ASSESSMENT  Patient Details Name: Derrick Cooper MRN: [id number] DOB: 05/09/1961 Today's Date: 07/12/2018       Information Obtained From: Patient  Able to Participate in Assessment/Interview: Yes  Patient Presentation: Alert  Reason for Admission (Per Patient): Other (Comments)(Pt stated he flipped out on his sister.)  Patient Stressors: Death(Pt stated his mother passed.)  Coping Skills:   Isolation, TV, Sports, Aggression, Exercise, Substance Abuse, Impulsivity, Talk, Prayer, Read  Leisure Interests (2+):  Games - Cross-word, Garment/textile technologistCommunity - Other (Comment)(Pt stated he used to take his dog to the dog park)  Frequency of Recreation/Participation: Other (Comment)(Daily)  Awareness of Community Resources:  Yes  Community Resources:  Park  Current Use: No  If no, Barriers?: Other (Comment)(Pt stated he doesn't like to leave the house.)  Expressed Interest in [employer] Street CorporationCommunity Resource Information: No  [place]  Patient Main Form of Transportation: Other (Comment)(Sister; Also uses public transportation)  Patient Strengths:  Like to compromise  Patient Identified Areas of Improvement:  Find groups in the community with people like me  Patient Goal for Hospitalization:  "Take medication"  Current SI (including self-harm):  No  Current HI:  Yes(Pt stated he was still angry at his sister but his other sister talked him down)  Current AVH: No  Staff Intervention Plan: Group Attendance, Collaborate with Interdisciplinary Treatment Team  Consent to Intern Participation: N/A    [name], LRT/CTRS  Caroll RancherLindsay, [name] 07/12/2018, 12:26 PM
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
## 2018-07-12 NOTE — [facility] Counselor (Signed)
Provided pt requested information on support services for LGBT community. Provided pt with LGBT support services. ° °Cherie Bohaboy, MSW Intern °07/12/18 12:30 PM °

## 2018-07-12 NOTE — [facility] Group Notes (Signed)
[facility] Group Notes:  (Nursing/MHT/Case Management/Adjunct)  Date:  07/12/2018  Time:  9:15 PM  Type of Therapy:  Wrap up group  Participation Level:  Active  Participation Quality:  Appropriate, Sharing and Supportive  Affect:  Flat  Cognitive:  Alert and Appropriate  Insight:  Appropriate  Engagement in Group:  Engaged, Improving and Supportive  Modes of Intervention:  Discussion and Support  Summary of Progress/Problems:  [name] reported his day started out bad because of the situation with his sister.  He further went on to say that over a pack of cigarettes, "I am now homeless."  [facility] of just stopping his medications.    Norm ParcelHeather V Sparsh Callens 07/12/2018, 9:15 PM
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
## 2018-07-13 LAB — URINE CULTURE: Culture: >=100000 — AB

## 2018-07-13 LAB — GLUCOSE, CAPILLARY
GLUCOSE-CAPILLARY: 160 mg/dL — AB (ref 70–99)
Glucose-Capillary: 269 mg/dL — ABNORMAL HIGH (ref 70–99)
Glucose-Capillary: 336 mg/dL — ABNORMAL HIGH (ref 70–99)

## 2018-07-13 MED ORDER — PERPHENAZINE 2 MG PO TABS
6.0000 mg | ORAL_TABLET | Freq: Three times a day (TID) | ORAL | Status: DC
  Administered 2018-07-13 – 2018-07-17 (×14): 6 mg via ORAL
  Filled 2018-07-13 (×20): qty 3

## 2018-07-13 NOTE — Progress Notes (Signed)
[facility]  07/13/2018 7:58 AM Derrick BakeKent L Maple  MRN:  [id number] Subjective:    Patient reports a continuation of voices "inside" his head but states they are diminished so far with the medication but not resolved. No EPS or TD no side effects that he can discern. Expressed homicidal thoughts towards her sister believes she has taken a warrant out on him, however previously he told me that she had taken a petition for involuntary commitment out so he may be confused in this regard but states he had thoughts of hurting her but does not have them now can contract thus far.  States he was just mad.  We will continue to monitor this. Denies feeling depressed he is worried about housing issues Denies visual hallucinations  Principal Problem: <principal problem not specified> Diagnosis: Active Problems:   Schizophrenia (HCC)  Total Time spent with patient: 20 minutes  Past Medical History:  Past Medical History:  Diagnosis Date  . Diabetes mellitus without complication (HCC)   . Hepatitis C   . HIV infection (HCC)   . Schizophrenia (HCC)   . Substance abuse (HCC)    History reviewed. No pertinent surgical history. Family History:  Family History  Problem Relation Age of Onset  . Breast cancer Mother   . Lung cancer Father   . Heart attack Father    Social History:  Social History   Substance and Sexual Activity  Alcohol Use Yes  . Frequency: Never     Social History   Substance and Sexual Activity  Drug Use Yes  . Types: Marijuana, Methamphetamines   Comment: has not used meth since 03/2017, marijuana once or twice a month    Social History   Socioeconomic History  . Marital status: Single    Spouse name: Not on file  . Number of children: 0  . Years of education: 9  . Highest education level: Not on file  Occupational History  . Not on file  Social Needs  . Financial resource strain: Not hard at all  . Food insecurity:    Worry: Never true    Inability:  Never true  . Transportation needs:    Medical: Yes    Non-medical: Not on file  Tobacco Use  . Smoking status: Current Every Day Smoker    Packs/day: 0.50    Years: 40.00    Pack years: 20.00    Types: Cigarettes  . Smokeless tobacco: Never Used  . Tobacco comment: "just not ready"  Substance and Sexual Activity  . Alcohol use: Yes    Frequency: Never  . Drug use: Yes    Types: Marijuana, Methamphetamines    Comment: has not used meth since 03/2017, marijuana once or twice a month  . Sexual activity: Not Currently    Comment: declined condoms  Lifestyle  . Physical activity:    Days per week: Not on file    Minutes per session: Not on file  . Stress: Not on file  Relationships  . Social connections:    Talks on phone: Not on file    Gets together: Not on file    Attends religious service: Not on file    Active member of club or organization: Not on file    Attends meetings of clubs or organizations: Not on file    Relationship status: Not on file  Other Topics Concern  . Not on file  Social History Narrative  . Not on file   Additional Social  History:                         Sleep: Good  Appetite:  Good  Current Medications: Current Facility-Administered Medications  Medication Dose Route Frequency Provider Last Rate Last Dose  . acetaminophen (TYLENOL) tablet 650 mg  650 mg Oral Q6H PRN Money, Gerlene Burdock, FNP      . alum & mag hydroxide-simeth (MAALOX/MYLANTA) 200-200-20 MG/5ML suspension 30 mL  30 mL Oral Q4H PRN Money, Feliz Beam B, FNP      . benztropine (COGENTIN) tablet 0.5 mg  0.5 mg Oral BID Malvin Johns, MD   0.5 mg at 07/12/18 1657  . bictegravir-emtricitabine-tenofovir AF (BIKTARVY) 50-200-25 MG per tablet 1 tablet  1 tablet Oral Daily Money, Gerlene Burdock, FNP   1 tablet at 07/12/18 0754  . cephALEXin (KEFLEX) capsule 500 mg  500 mg Oral BID Money, Gerlene Burdock, FNP   500 mg at 07/12/18 1657  . cloNIDine (CATAPRES) tablet 0.2 mg  0.2 mg Oral Q4H PRN Malvin Johns, MD      . Glecaprevir-Pibrentasvir 100-40 MG TABS 3 tablet  3 tablet Oral Daily Money, Gerlene Burdock, FNP      . hydrOXYzine (ATARAX/VISTARIL) tablet 25 mg  25 mg Oral TID PRN Money, Gerlene Burdock, FNP   25 mg at 07/11/18 2119  . insulin aspart (novoLOG) injection 5 Units  5 Units Subcutaneous TID WC Money, Gerlene Burdock, FNP   5 Units at 07/12/18 1726  . insulin glargine (LANTUS) injection 30 Units  30 Units Subcutaneous BID Money, Gerlene Burdock, FNP   30 Units at 07/12/18 1725  . magnesium hydroxide (MILK OF MAGNESIA) suspension 30 mL  30 mL Oral Daily PRN Money, Feliz Beam B, FNP      . metFORMIN (GLUCOPHAGE) tablet 500 mg  500 mg Oral Q breakfast Money, Gerlene Burdock, FNP   500 mg at 07/12/18 0754  . metoprolol succinate (TOPROL-XL) 24 hr tablet 100 mg  100 mg Oral Daily Malvin Johns, MD   100 mg at 07/12/18 1035  . nicotine (NICODERM CQ - dosed in mg/24 hours) patch 21 mg  21 mg Transdermal Daily Malvin Johns, MD   21 mg at 07/12/18 0754  . perphenazine (TRILAFON) tablet 6 mg  6 mg Oral TID Malvin Johns, MD      . rosuvastatin (CRESTOR) tablet 5 mg  5 mg Oral Daily Money, Gerlene Burdock, FNP   5 mg at 07/12/18 0754  . temazepam (RESTORIL) capsule 30 mg  30 mg Oral QHS Malvin Johns, MD   30 mg at 07/12/18 2047  . traZODone (DESYREL) tablet 50 mg  50 mg Oral QHS PRN Money, Gerlene Burdock, FNP   50 mg at 07/11/18 2119    Lab Results:  Results for orders placed or performed during the hospital encounter of 07/11/18 (from the past 48 hour(s))  Glucose, capillary     Status: Abnormal   Collection Time: 07/11/18  5:07 PM  Result Value Ref Range   Glucose-Capillary 115 (H) 70 - 99 mg/dL  Glucose, capillary     Status: Abnormal   Collection Time: 07/11/18  6:15 PM  Result Value Ref Range   Glucose-Capillary 135 (H) 70 - 99 mg/dL  Glucose, capillary     Status: Abnormal   Collection Time: 07/12/18  6:06 AM  Result Value Ref Range   Glucose-Capillary 174 (H) 70 - 99 mg/dL  Glucose, capillary     Status: Abnormal   Collection  Time:  07/12/18  4:55 PM  Result Value Ref Range   Glucose-Capillary 156 (H) 70 - 99 mg/dL  Glucose, capillary     Status: Abnormal   Collection Time: 07/13/18  5:51 AM  Result Value Ref Range   Glucose-Capillary 269 (H) 70 - 99 mg/dL    Blood Alcohol level:  Lab Results  Component Value Date   ETH <10 07/09/2018    Metabolic Disorder Labs: No results found for: HGBA1C, MPG No results found for: PROLACTIN Lab Results  Component Value Date   CHOL 173 09/13/2017   TRIG 100 09/13/2017   HDL 29 (L) 09/13/2017   CHOLHDL 6.0 (H) 09/13/2017   LDLCALC 123 (H) 09/13/2017    Physical Findings: AIMS: Facial and Oral Movements Muscles of Facial Expression: None, normal Lips and Perioral Area: None, normal Jaw: None, normal Tongue: None, normal,Extremity Movements Upper (arms, wrists, hands, fingers): None, normal Lower (legs, knees, ankles, toes): None, normal, Trunk Movements Neck, shoulders, hips: None, normal, Overall Severity Severity of abnormal movements (highest score from questions above): None, normal Incapacitation due to abnormal movements: None, normal Patient's awareness of abnormal movements (rate only patient's report): No Awareness, Dental Status Current problems with teeth and/or dentures?: No Does patient usually wear dentures?: No  CIWA:    COWS:     Musculoskeletal: Strength & Muscle Tone: within normal limits Gait & Station: normal Patient leans: N/A  Psychiatric Specialty Exam: Physical Exam  ROS  Blood pressure 105/75, pulse 81, temperature 98.5 F (36.9 C), temperature source Oral, resp. rate 18, height 6\' 2"  (1.88 m), weight 65.3 kg.Body mass index is 18.49 kg/m.  General Appearance: Disheveled  Eye Contact:  Minimal  Speech:  Clear and Coherent  Volume:  Decreased  Mood:  Dysphoric  Affect:  Appropriate  Thought Process:  Goal Directed  Orientation:  Full (Time, Place, and Person)  Thought Content:  Hallucinations: Auditory  Suicidal  Thoughts:  No  Homicidal Thoughts:  Yes.  without intent/plan  Memory:  Immediate;   Fair  Judgement:  Fair  Insight:  Fair  Psychomotor Activity:  TD  Concentration:  Concentration: Fair  Recall:  [employer] of Knowledge:  Fair  Language:  Fair  Akathisia:  Negative  Handed:  Right  AIMS (if indicated):     Assets:  Communication Skills Desire for Improvement  ADL's:  Intact  Cognition:  WNL  Sleep:  Number of Hours: 6.75    For psychosis continue reality based therapy escalate perphenazine For homicidal thinking continue to monitor make sure he can contract states that has dissipated now Continue current precautions and therapies For housing issues discussed with team and social work Treatment Plan Summary: Daily contact with patient to assess and evaluate symptoms and progress in treatment and Medication management  Lavine Hargrove, MD 07/13/2018, 7:58 AM

## 2018-07-13 NOTE — Tx Team (Signed)
Interdisciplinary Treatment and Diagnostic Plan Update  07/13/2018 Time of Session: 9:00am Derrick Cooper MRN: [id number]  Principal Diagnosis: <principal problem not specified>  Secondary Diagnoses: Active Problems:   Schizophrenia ([place])   Current Medications:  Current Facility-Administered Medications  Medication Dose Route Frequency Provider Last Rate Last Dose  . acetaminophen (TYLENOL) tablet 650 mg  650 mg Oral Q6H PRN Money, Lowry Ram, FNP      . alum & mag hydroxide-simeth (MAALOX/MYLANTA) 200-200-20 MG/5ML suspension 30 mL  30 mL Oral Q4H PRN Money, Darnelle Maffucci B, FNP      . benztropine (COGENTIN) tablet 0.5 mg  0.5 mg Oral BID Johnn Hai, MD   0.5 mg at 07/13/18 0814  . bictegravir-emtricitabine-tenofovir AF (BIKTARVY) 50-200-25 MG per tablet 1 tablet  1 tablet Oral Daily Money, Lowry Ram, FNP   1 tablet at 07/13/18 0806  . cephALEXin (KEFLEX) capsule 500 mg  500 mg Oral BID Money, Lowry Ram, FNP   500 mg at 07/13/18 0810  . cloNIDine (CATAPRES) tablet 0.2 mg  0.2 mg Oral Q4H PRN Johnn Hai, MD      . Glecaprevir-Pibrentasvir 100-40 MG TABS 3 tablet  3 tablet Oral Daily Money, Lowry Ram, FNP      . hydrOXYzine (ATARAX/VISTARIL) tablet 25 mg  25 mg Oral TID PRN Money, Lowry Ram, FNP   25 mg at 07/11/18 2119  . insulin aspart (novoLOG) injection 5 Units  5 Units Subcutaneous TID WC Money, Lowry Ram, FNP   5 Units at 07/13/18 1209  . insulin glargine (LANTUS) injection 30 Units  30 Units Subcutaneous BID Money, Lowry Ram, FNP   30 Units at 07/13/18 0805  . magnesium hydroxide (MILK OF MAGNESIA) suspension 30 mL  30 mL Oral Daily PRN Money, Darnelle Maffucci B, FNP      . metFORMIN (GLUCOPHAGE) tablet 500 mg  500 mg Oral Q breakfast Money, Lowry Ram, FNP   500 mg at 07/13/18 0810  . metoprolol succinate (TOPROL-XL) 24 hr tablet 100 mg  100 mg Oral Daily Johnn Hai, MD   100 mg at 07/13/18 0807  . nicotine (NICODERM CQ - dosed in mg/24 hours) patch 21 mg  21 mg Transdermal Daily Johnn Hai, MD   21  mg at 07/13/18 0807  . perphenazine (TRILAFON) tablet 6 mg  6 mg Oral TID Johnn Hai, MD   6 mg at 07/13/18 1209  . rosuvastatin (CRESTOR) tablet 5 mg  5 mg Oral Daily Money, Lowry Ram, FNP   5 mg at 07/13/18 6384  . temazepam (RESTORIL) capsule 30 mg  30 mg Oral QHS Johnn Hai, MD   30 mg at 07/12/18 2047  . traZODone (DESYREL) tablet 50 mg  50 mg Oral QHS PRN Money, Lowry Ram, FNP   50 mg at 07/11/18 2119   PTA Medications: Medications Prior to Admission  Medication Sig Dispense Refill Last Dose  . ACCU-CHEK AVIVA PLUS test strip 3 (three) times daily. as directed  1 Taking  . ACCU-CHEK SOFTCLIX LANCETS lancets 3 (three) times daily. as directed  1 Taking  . B-D UF III MINI PEN NEEDLES 31G X 5 MM MISC USE AS DIRECTED QID  1 Taking  . bictegravir-emtricitabine-tenofovir AF (BIKTARVY) 50-200-25 MG TABS tablet Take 1 tablet by mouth daily. 30 tablet 2 Past Month at Unknown time  . Blood Glucose Monitoring Suppl (ACCU-CHEK AVIVA PLUS) w/Device KIT 3 (three) times daily. as directed  0 Taking  . Glecaprevir-Pibrentasvir (MAVYRET) 100-40 MG TABS Take 3 tablets by mouth daily. [place]  tablet 1 Past Month at Unknown time  . HUMALOG KWIKPEN 100 UNIT/ML KiwkPen Inject 15 Units into the skin 3 (three) times daily. Before meals  0 07/09/2018 at Unknown time  . Insulin Pen Needle (PEN NEEDLES 3/16") 31G X 5 MM MISC by Does not apply route.   Taking  . LANTUS SOLOSTAR 100 UNIT/ML Solostar Pen Inject 60 Units into the skin 2 (two) times daily.   0 07/09/2018 at Unknown time  . lisinopril (PRINIVIL,ZESTRIL) 5 MG tablet Take 5 mg by mouth daily.  5 Past Month at Unknown time  . metFORMIN (GLUCOPHAGE) 500 MG tablet Take 500 mg by mouth daily with breakfast.    Past Month at Unknown time  . rosuvastatin (CRESTOR) 5 MG tablet Take 1 tablet (5 mg total) by mouth daily. 30 tablet 1 Past Month at Unknown time    Patient Stressors: Health problems Marital or family conflict Medication change or  noncompliance Substance abuse  Patient Strengths: Ability for insight Average or above average intelligence Capable of independent living General fund of knowledge  Treatment Modalities: Medication Management, Group therapy, Case management,  1 to 1 session with clinician, Psychoeducation, Recreational therapy.   Physician Treatment Plan for Primary Diagnosis: <principal problem not specified> Long Term Goal(s): Improvement in symptoms so as ready for discharge Improvement in symptoms so as ready for discharge   Short Term Goals: Ability to disclose and discuss suicidal ideas Ability to identify and develop effective coping behaviors will improve Ability to maintain clinical measurements within normal limits will improve Ability to disclose and discuss suicidal ideas  Medication Management: Evaluate patient's response, side effects, and tolerance of medication regimen.  Therapeutic Interventions: 1 to 1 sessions, Unit Group sessions and Medication administration.  Evaluation of Outcomes: Not Met  Physician Treatment Plan for Secondary Diagnosis: Active Problems:   Schizophrenia ([place])  Long Term Goal(s): Improvement in symptoms so as ready for discharge Improvement in symptoms so as ready for discharge   Short Term Goals: Ability to disclose and discuss suicidal ideas Ability to identify and develop effective coping behaviors will improve Ability to maintain clinical measurements within normal limits will improve Ability to disclose and discuss suicidal ideas     Medication Management: Evaluate patient's response, side effects, and tolerance of medication regimen.  Therapeutic Interventions: 1 to 1 sessions, Unit Group sessions and Medication administration.  Evaluation of Outcomes: Not Met   RN Treatment Plan for Primary Diagnosis: <principal problem not specified> Long Term Goal(s): Knowledge of disease and therapeutic regimen to maintain health will improve  Short  Term Goals: Ability to remain free from injury will improve, Ability to verbalize frustration and anger appropriately will improve, Ability to demonstrate self-control, Ability to participate in decision making will improve, Ability to verbalize feelings will improve, Ability to disclose and discuss suicidal ideas and Compliance with prescribed medications will improve  Medication Management: RN will administer medications as ordered by provider, will assess and evaluate patient's response and provide education to patient for prescribed medication. RN will report any adverse and/or side effects to prescribing provider.  Therapeutic Interventions: 1 on 1 counseling sessions, Psychoeducation, Medication administration, Evaluate responses to treatment, Monitor vital signs and CBGs as ordered, Perform/monitor CIWA, COWS, AIMS and Fall Risk screenings as ordered, Perform wound care treatments as ordered.  Evaluation of Outcomes: Not Met   LCSW Treatment Plan for Primary Diagnosis: <principal problem not specified> Long Term Goal(s): Safe transition to appropriate next level of care at discharge, Engage patient in therapeutic group  addressing interpersonal concerns.  Short Term Goals: Engage patient in aftercare planning with referrals and resources, Increase social support, Increase ability to appropriately verbalize feelings, Increase emotional regulation and Increase skills for wellness and recovery  Therapeutic Interventions: Assess for all discharge needs, 1 to 1 time with Social worker, Explore available resources and support systems, Assess for adequacy in community support network, Educate family and significant other(s) on suicide prevention, Complete Psychosocial Assessment, Interpersonal group therapy.  Evaluation of Outcomes: Not Met   Progress in Treatment: Attending groups: Yes. Participating in groups: Yes. Taking medication as prescribed: Yes. Toleration medication:  Yes. Family/Significant other contact made: Yes, individual(s) contacted:  sister Patient understands diagnosis: Yes. Discussing patient identified problems/goals with staff: Yes. Medical problems stabilized or resolved: No. Denies suicidal/homicidal ideation: Yes. Issues/concerns per patient self-inventory: Yes.  New problem(s) identified: Yes, Describe:  unsure if he can go back to living with sister  New Short Term/Long Term Goal(s): medication management for mood stabilization; elimination of SI thoughts; development of comprehensive mental wellness/sobriety plan.  Patient Goals:  "Get back on meds. Fix my relationship with my sister."  Discharge Plan or Barriers: [place] pamphlet, Mobile Crisis information, and AA/NA information provided to patient for additional community support and resources.   Reason for Continuation of Hospitalization: Anxiety Depression Hallucinations Medication stabilization Suicidal ideation  Estimated Length of Stay: 3-5 days  Attendees: Patient: Derrick Cooper 07/13/2018 2:52 PM  Physician: Dr.Farah 07/13/2018 2:52 PM  Nursing: Legrand Como 07/13/2018 2:52 PM  RN Care Manager: 07/13/2018 2:52 PM  Social Worker: Stephanie Acre, [place] 07/13/2018 2:52 PM  Recreational Therapist:  07/13/2018 2:52 PM  Other:  07/13/2018 2:52 PM  Other:  07/13/2018 2:52 PM  Other: 07/13/2018 2:52 PM    Scribe for Treatment Team: Joellen Jersey, [place] 07/13/2018 2:52 PM

## 2018-07-13 NOTE — Plan of Care (Signed)
Progress note  D: pt found in bed; compliant with medication administration. Pt denies any si/hi/ah/vh and verbally agrees to approach staff if these become apparent or [facility]. Pt denies any physical symptoms or pain, rating his pain a 0/10. Pt is still unsteady with his gait and continues to make poor choices regarding his DM II.  A: pt provided support and encouragement. Pt given medication per protocol and standing orders. Q7638m safety checks implemented and continued.  R: pt safe on the unit. Will continue to monitor.   Pt progressing in the following metrics  Problem: Education: Goal: Knowledge of [place] [facility] Education information/materials will improve Outcome: Progressing Goal: Emotional status will improve Outcome: Progressing Goal: Mental status will improve Outcome: Progressing Goal: Verbalization of understanding the information provided will improve Outcome: Progressing

## 2018-07-13 NOTE — [facility] Group Notes (Signed)
[facility]  07/13/2018   Type of Therapy:  Group Therapy  Participation Level:  Active  Participation Quality:  Appropriate, Sharing and Supportive  Affect:  Appropriate  Cognitive:  Alert  Insight:  Engaged and Supportive  Engagement in Therapy:  Engaged and Supportive  Modes of Intervention:  Discussion and Support  Summary of Progress/Problems: Patient attended a spiritual care group with Chaplain regarding the meaning of and importance of "hope" in their life. Patient defined hope as "a sense of false perception" initially, but then shared how he hoped to regain control of his life with medication and repairing family relationships. Patient demonstrated significant insight, sharing he is angry at himself for not taking medications for one year, but wants to accept responsibility.   Derrick McleanCharlotte C Jasiel Belisle 07/13/2018

## 2018-07-13 NOTE — Progress Notes (Signed)
Recreation Therapy Notes  Date: 12.13.19 Time: 1000 Location: 500 [address] Dayroom  Group Topic: Movie  Goal Area(s) Addresses:  Patient will identify some of the struggles of the characters of the movie. Patient will identify some of the coping skills used by the characters in the movie.  Behavioral Response: Engaged  Intervention: Therapeutic Movie  Activity: Otherhood. LRT played a movie for the patients that dealt with loneliness, acceptance, depression and loss of loved ones.  Patients were to identify how the characters in the movie coped with the issues they were dealing with and how those situations shaped the characters.  Education: Discharge Planning.   Education Outcome: Acknowledges education/In group clarification offered/Needs additional education.   Clinical Observations/Feedback:  Pt arrived late but was attentive to the situations in the movie.    Caroll RancherMarjette Eiliana Drone, LRT/CTRS       Caroll RancherLindsay, Sai Moura A 07/13/2018 11:53 AM

## 2018-07-14 ENCOUNTER — Telehealth: Payer: Self-pay

## 2018-07-14 DIAGNOSIS — F209 Schizophrenia, unspecified: Principal | ICD-10-CM

## 2018-07-14 LAB — GLUCOSE, CAPILLARY
GLUCOSE-CAPILLARY: 285 mg/dL — AB (ref 70–99)
Glucose-Capillary: 289 mg/dL — ABNORMAL HIGH (ref 70–99)

## 2018-07-14 NOTE — Plan of Care (Signed)
D: Patient presents calm and cooperative. He appears in no acute distress. He states he is having HI towards his sister, but would not elaborate. He also endorses AVH of "fuzzy stuff." His hallucinations are bizarre and he does not appear to be responding to internal stimuli. His speech is linear and his behavior is appropriate. He has a patch over his left eye. He reports sleeping well last night. He is smiling and his affect is pleasant.  A: Patient checked q15 min, and checks reviewed. Reviewed medication changes with patient and educated on side effects. Educated patient on importance of attending group therapy sessions and educated on several coping skills. Encouarged participation in milieu through recreation therapy and attending meals with peers. Support and encouragement provided. Fluids offered. R: Patient receptive to education on medications, and is medication compliant. Patient contracts for safety on the unit.  Problem: Education: Goal: Emotional status will improve Outcome: Progressing Goal: Mental status will improve Outcome: Progressing   Problem: Activity: Goal: Interest or engagement in activities will improve Outcome: Progressing Goal: Sleeping patterns will improve Outcome: Progressing

## 2018-07-14 NOTE — Telephone Encounter (Signed)
Post ED Visit - Positive Culture Follow-up  Culture report reviewed by antimicrobial stewardship pharmacist:  []  Enzo BiNathan Batchelder, Pharm.D. []  Celedonio MiyamotoJeremy Frens, Pharm.D., BCPS AQ-ID []  Garvin FilaMike Maccia, Pharm.D., BCPS []  Georgina PillionElizabeth Martin, Pharm.D., BCPS []  SmithlandMinh Pham, [address] BoulevardPharm.D., BCPS, AAHIVP []  Estella HuskMichelle Turner, Pharm.D., BCPS, AAHIVP []  Lysle Pearlachel Rumbarger, PharmD, BCPS []  Phillips Climeshuy Dang, PharmD, BCPS []  Agapito GamesAlison Masters, PharmD, BCPS [x]  Verlan FriendsErin Deja, PharmD  Positive urine culture Treated with Cephalexin, organism sensitive to the same and no further patient follow-up is required at this time.  Jerry CarasCullom, Alix Lahmann Burnett 07/14/2018, 8:51 AM

## 2018-07-14 NOTE — Progress Notes (Signed)
D: Pt was in dayroom upon initial approach.  Pt presents with anxious affect and mood.  He reports his day "started out all right but I'm really aggravated."  He reports HI to "my family" and verbally contracts for safety.  Pt denies SI, denies pain.  When asked if he is having hallucinations, he states "no it's just like somebody else invades my body, my thoughts be talking to me."  Pt has been visible in milieu interacting with peers and staff appropriately.    A: Introduced self to pt.  Met with pt 1:1.  Actively listened to pt and offered support and encouragement.  Medication administered per order.  PRN medication administered for anxiety.  Fall prevention techniques reviewed with pt and he verbalized understanding.  Q15 minute safety checks maintained.  R: Pt is safe on the unit.  Pt is compliant with medications.  Pt verbally contracts for safety.  Will continue to monitor and assess.
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
## 2018-07-14 NOTE — Progress Notes (Signed)
[facility] MD Progress Note  07/14/2018 10:50 AM Derrick Cooper  MRN:  [id number]  Subjective:  Derrick Cooper seen resting in bed.  He is awake alert and oriented x3.  Patient continues to express thoughts of wanting to hurt his sister.  Patient reports" she took a warnt out on me."  Patient did not elaborate as to why this made in upset and angry.  Denies suicidal ideations.  Reports auditory hallucinations however reports voices have minimized since his admission.  Reports a good appetite.  States he is resting well.  Patient reports he is medication compliant.  Denies depression or depressive symptoms during this assessment.  Support encouragement reassurance was provided.  History:Derrick Cooper is an 57 y.o. male that presents this date with S/I. Patient denies any H/I or VH. Patient reports he has a plan to "run into traffic." Patient reports active AH stating he "just hears things like humming all the time." Patient reports current stressors to include: recently being asked to leave his sister's residence due to medication non-compliance and increased depression associated with MH issues. Patient states he had been receiving [facility] until three months ago when he discontinued his medication regimen due patient stating "they were not working." Patient reports a prior history of Schizophrenia although cannot recall when he was diagnosed with that disorder. Patient renders a vague treatment history reporting multiple attempts at self harm although cannot elaborate on prior incidents or history of inpatient hospitalization   Principal Problem: <principal problem not specified> Diagnosis: Active Problems:   Schizophrenia (HCC)  Total Time spent with patient: 15 minutes  Past Psychiatric History:  Schizophrenia, depression and reported substance abuse use  Past Medical History:  Past Medical History:  Diagnosis Date  . Diabetes mellitus without complication (HCC)   . Hepatitis C   . HIV infection  (HCC)   . Schizophrenia (HCC)   . Substance abuse (HCC)    History reviewed. No pertinent surgical history. Family History:  Family History  Problem Relation Age of Onset  . Breast cancer Mother   . Lung cancer Father   . Heart attack Father    Family Psychiatric  History:  Social History:  Social History   Substance and Sexual Activity  Alcohol Use Yes  . Frequency: Never     Social History   Substance and Sexual Activity  Drug Use Yes  . Types: Marijuana, Methamphetamines   Comment: has not used meth since 03/2017, marijuana once or twice a month    Social History   Socioeconomic History  . Marital status: Single    Spouse name: Not on file  . Number of children: 0  . Years of education: 9  . Highest education level: Not on file  Occupational History  . Not on file  Social Needs  . Financial resource strain: Not hard at all  . Food insecurity:    Worry: Never true    Inability: Never true  . Transportation needs:    Medical: Yes    Non-medical: Not on file  Tobacco Use  . Smoking status: Current Every Day Smoker    Packs/day: 0.50    Years: 40.00    Pack years: 20.00    Types: Cigarettes  . Smokeless tobacco: Never Used  . Tobacco comment: "just not ready"  Substance and Sexual Activity  . Alcohol use: Yes    Frequency: Never  . Drug use: Yes    Types: Marijuana, Methamphetamines    Comment: has not used  meth since 03/2017, marijuana once or twice a month  . Sexual activity: Not Currently    Comment: declined condoms  Lifestyle  . Physical activity:    Days per week: Not on file    Minutes per session: Not on file  . Stress: Not on file  Relationships  . Social connections:    Talks on phone: Not on file    Gets together: Not on file    Attends religious service: Not on file    Active member of club or organization: Not on file    Attends meetings of clubs or organizations: Not on file    Relationship status: Not on file  Other Topics Concern   . Not on file  Social History Narrative  . Not on file   Additional Social History:                         Sleep: Fair  Appetite:  Fair  Current Medications: Current Facility-Administered Medications  Medication Dose Route Frequency Provider Last Rate Last Dose  . acetaminophen (TYLENOL) tablet 650 mg  650 mg Oral Q6H PRN Money, Gerlene Burdock, FNP      . alum & mag hydroxide-simeth (MAALOX/MYLANTA) 200-200-20 MG/5ML suspension 30 mL  30 mL Oral Q4H PRN Money, Feliz Beam B, FNP      . benztropine (COGENTIN) tablet 0.5 mg  0.5 mg Oral BID Malvin Johns, MD   0.5 mg at 07/14/18 1000  . bictegravir-emtricitabine-tenofovir AF (BIKTARVY) 50-200-25 MG per tablet 1 tablet  1 tablet Oral Daily Money, Gerlene Burdock, FNP   1 tablet at 07/14/18 0959  . cephALEXin (KEFLEX) capsule 500 mg  500 mg Oral BID Money, Gerlene Burdock, FNP   500 mg at 07/14/18 0959  . cloNIDine (CATAPRES) tablet 0.2 mg  0.2 mg Oral Q4H PRN Malvin Johns, MD      . Glecaprevir-Pibrentasvir 100-40 MG TABS 3 tablet  3 tablet Oral Daily Money, Gerlene Burdock, FNP      . hydrOXYzine (ATARAX/VISTARIL) tablet 25 mg  25 mg Oral TID PRN Money, Gerlene Burdock, FNP   25 mg at 07/13/18 1945  . insulin aspart (novoLOG) injection 5 Units  5 Units Subcutaneous TID WC Money, Gerlene Burdock, FNP   5 Units at 07/14/18 [phone]  . insulin glargine (LANTUS) injection 30 Units  30 Units Subcutaneous BID Money, Gerlene Burdock, FNP   30 Units at 07/14/18 1001  . magnesium hydroxide (MILK OF MAGNESIA) suspension 30 mL  30 mL Oral Daily PRN Money, Feliz Beam B, FNP      . metFORMIN (GLUCOPHAGE) tablet 500 mg  500 mg Oral Q breakfast Money, Feliz Beam B, FNP   500 mg at 07/14/18 1000  . metoprolol succinate (TOPROL-XL) 24 hr tablet 100 mg  100 mg Oral Daily Malvin Johns, MD   100 mg at 07/14/18 1000  . nicotine (NICODERM CQ - dosed in mg/24 hours) patch 21 mg  21 mg Transdermal Daily Malvin Johns, MD   21 mg at 07/14/18 1002  . perphenazine (TRILAFON) tablet 6 mg  6 mg Oral TID Malvin Johns, MD    6 mg at 07/14/18 1000  . rosuvastatin (CRESTOR) tablet 5 mg  5 mg Oral Daily Money, Feliz Beam B, FNP   5 mg at 07/14/18 1001  . temazepam (RESTORIL) capsule 30 mg  30 mg Oral QHS Malvin Johns, MD   30 mg at 07/13/18 2111  . traZODone (DESYREL) tablet 50 mg  50 mg Oral QHS PRN  Money, Gerlene Burdock, FNP   50 mg at 07/11/18 2119    Lab Results:  Results for orders placed or performed during the hospital encounter of 07/11/18 (from the past 48 hour(s))  Glucose, capillary     Status: Abnormal   Collection Time: 07/12/18  4:55 PM  Result Value Ref Range   Glucose-Capillary 156 (H) 70 - 99 mg/dL  Glucose, capillary     Status: Abnormal   Collection Time: 07/13/18  5:51 AM  Result Value Ref Range   Glucose-Capillary 269 (H) 70 - 99 mg/dL  Glucose, capillary     Status: Abnormal   Collection Time: 07/13/18 12:05 PM  Result Value Ref Range   Glucose-Capillary 160 (H) 70 - 99 mg/dL   Comment 1 Notify RN    Comment 2 Document in Chart   Glucose, capillary     Status: Abnormal   Collection Time: 07/13/18  5:01 PM  Result Value Ref Range   Glucose-Capillary 336 (H) 70 - 99 mg/dL  Glucose, capillary     Status: Abnormal   Collection Time: 07/14/18  6:16 AM  Result Value Ref Range   Glucose-Capillary 285 (H) 70 - 99 mg/dL    Blood Alcohol level:  Lab Results  Component Value Date   ETH <10 07/09/2018    Metabolic Disorder Labs: No results found for: HGBA1C, MPG No results found for: PROLACTIN Lab Results  Component Value Date   CHOL 173 09/13/2017   TRIG 100 09/13/2017   HDL 29 (L) 09/13/2017   CHOLHDL 6.0 (H) 09/13/2017   LDLCALC 123 (H) 09/13/2017    Physical Findings: AIMS: Facial and Oral Movements Muscles of Facial Expression: None, normal Lips and Perioral Area: None, normal Jaw: None, normal Tongue: None, normal,Extremity Movements Upper (arms, wrists, hands, fingers): None, normal Lower (legs, knees, ankles, toes): None, normal, Trunk Movements Neck, shoulders, hips: None,  normal, Overall Severity Severity of abnormal movements (highest score from questions above): None, normal Incapacitation due to abnormal movements: None, normal Patient's awareness of abnormal movements (rate only patient's report): No Awareness, Dental Status Current problems with teeth and/or dentures?: No Does patient usually wear dentures?: No  CIWA:    COWS:     Musculoskeletal: Strength & Muscle Tone: within normal limits Gait & Station: normal Patient leans: N/A  Psychiatric Specialty Exam: Physical Exam  Vitals reviewed. Constitutional: He appears well-developed.  Cardiovascular: Normal rate.  Psychiatric: He has a normal mood and affect. His behavior is normal.    Review of Systems  Psychiatric/Behavioral: Positive for depression and hallucinations. The patient is nervous/anxious and has insomnia.   All other systems reviewed and are negative.   Blood pressure 90/69, pulse 83, temperature 97.6 F (36.4 C), temperature source Oral, resp. rate 18, height 6\' 2"  (1.88 m), weight 65.3 kg.Body mass index is 18.49 kg/m.  General Appearance: Casual  Eye Contact:  Fair  Speech:  Clear and Coherent  Volume:  Normal  Mood:  Anxious and Depressed  Affect:  Congruent  Thought Process:  Coherent  Orientation:  Full (Time, Place, and Person)  Thought Content:  Hallucinations: Auditory  Suicidal Thoughts:  No  Homicidal Thoughts:  No  Memory:  Remote;   Fair  Judgement:  Fair  Insight:  Lacking  Psychomotor Activity:  Normal  Concentration:  Concentration: Fair  Recall:  [employer] of Knowledge:  Fair  Language:  Fair  Akathisia:  No  Handed:  Right  AIMS (if indicated):     Assets:  Communication  Skills Desire for Improvement Resilience Social Support  ADL's:  Intact  Cognition:  WNL  Sleep:  Number of Hours: 6     Treatment Plan Summary: Daily contact with patient to assess and evaluate symptoms and progress in treatment and Medication management   Continue  her current treatment plan on 07/14/2018 as listed below except for noted   Mood stabilization:  Continue Trilafon 6 mg po daily   Insomnia:   Continue Trazodone 50 mg PRN   Continue Restoril 30 mg Po QHS   HIV:   Continue Glecaprecir-Pibrentasvir 100mg -40 mg tabs 3 tables    Will continue to monitor vitals ,medication compliance and treatment side effects while patient is here.  Reviewed labs:,BAL - , UDS - pos for cocaine, thc and benzodizpines.  CSW will continue working on disposition.  Patient to participate in therapeutic milieu   Oneta Rack, NP 07/14/2018, 10:50 AM

## 2018-07-15 LAB — COMPREHENSIVE METABOLIC PANEL
ALT: 14 U/L (ref 0–44)
ANION GAP: 9 (ref 5–15)
AST: 19 U/L (ref 15–41)
Albumin: 3.4 g/dL — ABNORMAL LOW (ref 3.5–5.0)
Alkaline Phosphatase: 53 U/L (ref 38–126)
BUN: 12 mg/dL (ref 6–20)
CO2: 24 mmol/L (ref 22–32)
Calcium: 9.1 mg/dL (ref 8.9–10.3)
Chloride: 103 mmol/L (ref 98–111)
Creatinine, Ser: 0.88 mg/dL (ref 0.61–1.24)
GFR calc Af Amer: >60 mL/min (ref >60)
GFR calc non Af Amer: >60 mL/min (ref >60)
Glucose, Bld: 316 mg/dL — ABNORMAL HIGH (ref 70–99)
POTASSIUM: 4.2 mmol/L (ref 3.5–5.1)
Sodium: 136 mmol/L (ref 135–145)
Total Bilirubin: 0.5 mg/dL (ref 0.3–1.2)
Total Protein: 7.1 g/dL (ref 6.5–8.1)

## 2018-07-15 LAB — GLUCOSE, CAPILLARY
Glucose-Capillary: 241 mg/dL — ABNORMAL HIGH (ref 70–99)
Glucose-Capillary: 199 mg/dL — ABNORMAL HIGH (ref 70–99)

## 2018-07-15 MED ORDER — TRAZODONE HCL 100 MG PO TABS: 100.0000 mg | ORAL_TABLET | Freq: Every evening | ORAL | Status: DC | PRN

## 2018-07-15 MED ORDER — TRAZODONE HCL 50 MG PO TABS: 50.0000 mg | ORAL_TABLET | Freq: Every evening | ORAL | Status: DC | PRN

## 2018-07-15 NOTE — Progress Notes (Signed)
[facility] MD Progress Note  07/15/2018 1:26 PM Derrick Cooper  MRN:  [id number]  Subjective:  [facility] observed sitting in day room interacting with peers.  Reports feeling better than yesterday.  As he denies that he no longer homicidal towards her sister.  Reports he still very angry that she has taken out a warrent on him.  Reports auditory hallucinations have subsided with medication.  Patient reports he needs to get an ID as he is homeless, patient advised to follow-up with Child psychotherapist.  patient reports he is usually followed by [facility] however had been paranoid to follow back up for the past year.  Patient reports spending 11 years in prison for bringing his house down because he believes his roommate was trying to poison him.  Currently denying suicidal or homicidal ideations.  Denies auditory visual hallucinations.  Reports a good appetite.  States he is resting well throughout the night.  Denies paranoia ideations during this assessment.  Support encouragement reassurance was provided.  History:Derrick Cooper is an 57 y.o. male that presents this date with S/I. Patient denies any H/I or VH. Patient reports he has a plan to "run into traffic." Patient reports active AH stating he "just hears things like humming all the time." Patient reports current stressors to include: recently being asked to leave his sister's residence due to medication non-compliance and increased depression associated with MH issues. Patient states he had been receiving services from [facility] until three months ago when he discontinued his medication regimen due patient stating "they were not working." Patient reports a prior history of Schizophrenia although cannot recall when he was diagnosed with that disorder. Patient renders a vague treatment history reporting multiple attempts at self harm although cannot elaborate on prior incidents or history of inpatient hospitalization   Principal Problem: <principal problem not  specified> Diagnosis: Active Problems:   Schizophrenia (HCC)  Total Time spent with patient: 15 minutes  Past Psychiatric History:  Schizophrenia, depression and reported substance abuse use  Past Medical History:  Past Medical History:  Diagnosis Date  . Diabetes mellitus without complication (HCC)   . Hepatitis C   . HIV infection (HCC)   . Schizophrenia (HCC)   . Substance abuse (HCC)    History reviewed. No pertinent surgical history. Family History:  Family History  Problem Relation Age of Onset  . Breast cancer Mother   . Lung cancer Father   . Heart attack Father    Family Psychiatric  History:  Social History:  Social History   Substance and Sexual Activity  Alcohol Use Yes  . Frequency: Never     Social History   Substance and Sexual Activity  Drug Use Yes  . Types: Marijuana, Methamphetamines   Comment: has not used meth since 03/2017, marijuana once or twice a month    Social History   Socioeconomic History  . Marital status: Single    Spouse name: Not on file  . Number of children: 0  . Years of education: 9  . Highest education level: Not on file  Occupational History  . Not on file  Social Needs  . Financial resource strain: Not hard at all  . Food insecurity:    Worry: Never true    Inability: Never true  . Transportation needs:    Medical: Yes    Non-medical: Not on file  Tobacco Use  . Smoking status: Current Every Day Smoker    Packs/day: 0.50    Years: 40.00  Pack years: 20.00    Types: Cigarettes  . Smokeless tobacco: Never Used  . Tobacco comment: "just not ready"  Substance and Sexual Activity  . Alcohol use: Yes    Frequency: Never  . Drug use: Yes    Types: Marijuana, Methamphetamines    Comment: has not used meth since 03/2017, marijuana once or twice a month  . Sexual activity: Not Currently    Comment: declined condoms  Lifestyle  . Physical activity:    Days per week: Not on file    Minutes per session: Not on  file  . Stress: Not on file  Relationships  . Social connections:    Talks on phone: Not on file    Gets together: Not on file    Attends religious service: Not on file    Active member of club or organization: Not on file    Attends meetings of clubs or organizations: Not on file    Relationship status: Not on file  Other Topics Concern  . Not on file  Social History Narrative  . Not on file   Additional Social History:                         Sleep: Fair  Appetite:  Fair  Current Medications: Current Facility-Administered Medications  Medication Dose Route Frequency Provider Last Rate Last Dose  . acetaminophen (TYLENOL) tablet 650 mg  650 mg Oral Q6H PRN Money, Gerlene Burdock, FNP   650 mg at 07/14/18 2005  . alum & mag hydroxide-simeth (MAALOX/MYLANTA) 200-200-20 MG/5ML suspension 30 mL  30 mL Oral Q4H PRN Money, Feliz Beam B, FNP      . benztropine (COGENTIN) tablet 0.5 mg  0.5 mg Oral BID Malvin Johns, MD   0.5 mg at 07/15/18 0816  . bictegravir-emtricitabine-tenofovir AF (BIKTARVY) 50-200-25 MG per tablet 1 tablet  1 tablet Oral Daily Money, Gerlene Burdock, FNP   1 tablet at 07/15/18 0816  . cephALEXin (KEFLEX) capsule 500 mg  500 mg Oral BID Money, Gerlene Burdock, FNP   500 mg at 07/15/18 0815  . cloNIDine (CATAPRES) tablet 0.2 mg  0.2 mg Oral Q4H PRN Malvin Johns, MD      . hydrOXYzine (ATARAX/VISTARIL) tablet 25 mg  25 mg Oral TID PRN Money, Gerlene Burdock, FNP   25 mg at 07/14/18 2005  . insulin aspart (novoLOG) injection 5 Units  5 Units Subcutaneous TID WC Money, Gerlene Burdock, FNP   5 Units at 07/15/18 1206  . insulin glargine (LANTUS) injection 30 Units  30 Units Subcutaneous BID Maryfrances Bunnell, [place]   30 Units at 07/15/18 1610  . magnesium hydroxide (MILK OF MAGNESIA) suspension 30 mL  30 mL Oral Daily PRN Money, Feliz Beam B, FNP      . metFORMIN (GLUCOPHAGE) tablet 500 mg  500 mg Oral Q breakfast Money, Gerlene Burdock, FNP   500 mg at 07/15/18 0815  . metoprolol succinate (TOPROL-XL) 24 hr  tablet 100 mg  100 mg Oral Daily Malvin Johns, MD   Stopped at 07/15/18 0815  . nicotine (NICODERM CQ - dosed in mg/24 hours) patch 21 mg  21 mg Transdermal Daily Malvin Johns, MD   21 mg at 07/15/18 0817  . perphenazine (TRILAFON) tablet 6 mg  6 mg Oral TID Malvin Johns, MD   6 mg at 07/15/18 1205  . rosuvastatin (CRESTOR) tablet 5 mg  5 mg Oral Daily Money, Gerlene Burdock, FNP   5 mg at 07/15/18 0815  .  temazepam (RESTORIL) capsule 30 mg  30 mg Oral QHS Malvin JohnsFarah, Brian, MD   30 mg at 07/14/18 2014  . traZODone (DESYREL) tablet 50 mg  50 mg Oral QHS PRN Oneta RackLewis,  N, NP        Lab Results:  Results for orders placed or performed during the hospital encounter of 07/11/18 (from the past 48 hour(s))  Glucose, capillary     Status: Abnormal   Collection Time: 07/13/18  5:01 PM  Result Value Ref Range   Glucose-Capillary 336 (H) 70 - 99 mg/dL  Glucose, capillary     Status: Abnormal   Collection Time: 07/14/18  6:16 AM  Result Value Ref Range   Glucose-Capillary 285 (H) 70 - 99 mg/dL  Glucose, capillary     Status: Abnormal   Collection Time: 07/14/18  5:17 PM  Result Value Ref Range   Glucose-Capillary 289 (H) 70 - 99 mg/dL  Glucose, capillary     Status: Abnormal   Collection Time: 07/15/18  6:31 AM  Result Value Ref Range   Glucose-Capillary 241 (H) 70 - 99 mg/dL  Comprehensive metabolic panel     Status: Abnormal   Collection Time: 07/15/18  7:30 AM  Result Value Ref Range   Sodium 136 135 - 145 mmol/L   Potassium 4.2 3.5 - 5.1 mmol/L   Chloride 103 98 - 111 mmol/L   CO2 24 22 - 32 mmol/L   Glucose, Bld 316 (H) 70 - 99 mg/dL   BUN 12 6 - 20 mg/dL   Creatinine, Ser 8.650.88 0.61 - 1.24 mg/dL   Calcium 9.1 8.9 - 78.410.3 mg/dL   Total Protein 7.1 6.5 - 8.1 g/dL   Albumin 3.4 (L) 3.5 - 5.0 g/dL   AST 19 15 - 41 U/L   ALT 14 0 - 44 U/L   Alkaline Phosphatase 53 38 - 126 U/L   Total Bilirubin 0.5 0.3 - 1.2 mg/dL   GFR calc non Af Amer >60 >60 mL/min   GFR calc Af Amer >60 >60 mL/min   Anion  gap 9 5 - 15    Comment: Performed at [facility], [address] [place], [place] [id number]  Glucose, capillary     Status: Abnormal   Collection Time: 07/15/18 12:04 PM  Result Value Ref Range   Glucose-Capillary 199 (H) 70 - 99 mg/dL   Comment 1 Notify RN    Comment 2 Document in Chart     Blood Alcohol level:  Lab Results  Component Value Date   ETH <10 07/09/2018    Metabolic Disorder Labs: No results found for: HGBA1C, MPG No results found for: PROLACTIN Lab Results  Component Value Date   CHOL 173 09/13/2017   TRIG 100 09/13/2017   HDL 29 (L) 09/13/2017   CHOLHDL 6.0 (H) 09/13/2017   LDLCALC 123 (H) 09/13/2017    Physical Findings: AIMS: Facial and Oral Movements Muscles of Facial Expression: None, normal Lips and Perioral Area: None, normal Jaw: None, normal Tongue: None, normal,Extremity Movements Upper (arms, wrists, hands, fingers): None, normal Lower (legs, knees, ankles, toes): None, normal, Trunk Movements Neck, shoulders, hips: None, normal, Overall Severity Severity of abnormal movements (highest score from questions above): None, normal Incapacitation due to abnormal movements: None, normal Patient's awareness of abnormal movements (rate only patient's report): No Awareness, Dental Status Current problems with teeth and/or dentures?: No Does patient usually wear dentures?: No  CIWA:  CIWA-Ar Total: 0 COWS:  COWS Total Score: 0  Musculoskeletal: Strength &  Muscle Tone: within normal limits Gait & Station: normal Patient leans: N/A  Psychiatric Specialty Exam: Physical Exam  Vitals reviewed. Constitutional: He appears well-developed.  Cardiovascular: Normal rate.  Psychiatric: He has a normal mood and affect. His behavior is normal.    Review of Systems  Psychiatric/Behavioral: Positive for depression and hallucinations. The patient is nervous/anxious and has insomnia.   All other systems reviewed and are negative.    Blood pressure 108/75, pulse 76, temperature 97.7 F (36.5 C), temperature source Oral, resp. rate 18, height 6\' 2"  (1.88 m), weight 65.3 kg.Body mass index is 18.49 kg/m.  General Appearance: Casual  Eye Contact:  Fair  Speech:  Clear and Coherent  Volume:  Normal  Mood:  Anxious and Depressed  Affect:  Congruent  Thought Process:  Coherent  Orientation:  Full (Time, Place, and Person)  Thought Content:  Hallucinations: Auditory  Suicidal Thoughts:  No  Homicidal Thoughts:  No  Memory:  Remote;   Fair  Judgement:  Fair  Insight:  Lacking  Psychomotor Activity:  Normal  Concentration:  Concentration: Fair  Recall:  [employer] of Knowledge:  Fair  Language:  Fair  Akathisia:  No  Handed:  Right  AIMS (if indicated):     Assets:  Communication Skills Desire for Improvement Resilience Social Support  ADL's:  Intact  Cognition:  WNL  Sleep:  Number of Hours: 6     Treatment Plan Summary: Daily contact with patient to assess and evaluate symptoms and progress in treatment and Medication management   Continue her current treatment plan on 07/15/2018 as listed below except for noted   Mood stabilization:  Continue Trilafon 6 mg po daily   Insomnia:   Continue Trazodone 50 mg PRN   Continue Restoril 30 mg Po QHS   HIV:   Continue Glecaprecir-Pibrentasvir 100mg -40 mg tabs 3 tables    Will continue to monitor vitals ,medication compliance and treatment side effects while patient is here.  Reviewed labs:,BAL - , UDS - pos for cocaine, thc and benzodizpines.  CSW will continue working on disposition-patient reports he is homeless and needed information regarding obtaining ID.  Patient to participate in therapeutic milieu   Oneta Rack, NP 07/15/2018, 1:26 PM

## 2018-07-15 NOTE — Plan of Care (Signed)
D: Patient presents calm and cooperative. He says he is on the upswing today and denies all. He feels like his meds are working, but presented somewhat paranoid/guarded. His blood sugars are in the mid 200's. His blood pressure was low this morning 88/66, and we held his metoprolol with a provider's order Chilton Greathouse(T. Lewis, NP) and it increased 108/75 on recheck. Patient denies SI/HI/AVH. He slept fair last night and found medication was helpful. His appetite is fair, energy normal and concentration good. He rates his depression 4/10 and feeling of hopelessness 3/10. He did complain of some lightheadedness.  A: Patient checked q15 min, and checks reviewed. Reviewed medication changes with patient and educated on side effects. Educated patient on importance of attending group therapy sessions and educated on several coping skills. Encouarged participation in milieu through recreation therapy and attending meals with peers. Support and encouragement provided. Fluids offered. R: Patient receptive to education on medications, and is medication compliant. Patient contracts for safety on the unit. Goal: "Stay positive."
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
## 2018-07-15 NOTE — Plan of Care (Signed)
  Problem: Education: Goal: Verbalization of understanding the information provided will improve Outcome: Progressing   Problem: Activity: Goal: Sleeping patterns will improve Outcome: Progressing   

## 2018-07-15 NOTE — Progress Notes (Signed)
D   Pt is pleasant on approach   He tends to keep to himself and is somewhat guarded   He is compliant with medications   He reports improvement in his mood and outlook since being here A   Verbal support given   Medications administered and effectiveness monitored   Q 15 min checks R   Pt remains safe at this time

## 2018-07-16 LAB — GLUCOSE, CAPILLARY
Glucose-Capillary: 228 mg/dL — ABNORMAL HIGH (ref 70–99)
Glucose-Capillary: 208 mg/dL — ABNORMAL HIGH (ref 70–99)
Glucose-Capillary: 232 mg/dL — ABNORMAL HIGH (ref 70–99)
Glucose-Capillary: 407 mg/dL — ABNORMAL HIGH (ref 70–99)
Glucose-Capillary: 220 mg/dL — ABNORMAL HIGH (ref 70–99)

## 2018-07-16 LAB — HEMOGLOBIN A1C
Hgb A1c MFr Bld: 11.2 % — ABNORMAL HIGH (ref 4.8–5.6)
Mean Plasma Glucose: 274.74 mg/dL

## 2018-07-16 MED ORDER — PANTOPRAZOLE SODIUM 40 MG PO TBEC
40.0000 mg | DELAYED_RELEASE_TABLET | Freq: Every day | ORAL | Status: DC
  Administered 2018-07-16 – 2018-07-17 (×2): 40 mg via ORAL
  Filled 2018-07-16 (×5): qty 1

## 2018-07-16 MED ORDER — INSULIN ASPART 100 UNIT/ML ~~LOC~~ SOLN
0.0000 [IU] | Freq: Every day | SUBCUTANEOUS | Status: DC
  Administered 2018-07-16: 2 [IU] via SUBCUTANEOUS

## 2018-07-16 MED ORDER — INSULIN ASPART 100 UNIT/ML ~~LOC~~ SOLN
0.0000 [IU] | Freq: Three times a day (TID) | SUBCUTANEOUS | Status: DC
  Administered 2018-07-16: 15 [IU] via SUBCUTANEOUS
  Administered 2018-07-17: 5 [IU] via SUBCUTANEOUS
  Administered 2018-07-17: 15 [IU] via SUBCUTANEOUS

## 2018-07-16 NOTE — Progress Notes (Addendum)
Patient ID: Derrick Cooper, male   DOB: 03/24/1961, 57 y.o.   MRN: [id number]  D. Pt presents with a flat affect and cooperative behavior. Pt reports ongoing auditory hallucinations. Pt refused Metoprolol as he says it was for proteinuria and not HTN. Pt attends group and goes down to the gym today.    A. Labs and vitals monitored. Pt given and educated on medications. Pt supported emotionally and encouraged to express concerns and ask questions.    R. Pt remains safe with 15 minute checks.Pt currently denies SI/HI and visual hallucinations and agrees to contact staff before acting on any harmful thoughts. Pt remains safe on the unit. Will continue POC.

## 2018-07-16 NOTE — [facility] Suicide Risk Assessment (Signed)
[facility] INPATIENT:  Family/Significant Other Suicide Prevention Education  Suicide Prevention Education:  Contact Attempts: sister, Avanell ShackletonLoria Leavy [phone], has been identified by the patient as the family member/significant other with whom the patient will be residing, and identified as the person(s) who will aid the patient in the event of a mental health crisis.  With written consent from the patient, two attempts were made to provide suicide prevention education, prior to and/or following the patient's discharge.  We were unsuccessful in providing suicide prevention education.  A suicide education pamphlet was given to the patient to share with family/significant other.  Date and time of first attempt:07/12/18 / 12:20  PM Date and time of second attempt: 07/16/18 12:32 PM  Left VM with call back number   Derrick McleanCharlotte C Sereena Cooper,  07/16/2018, 12:32 PM

## 2018-07-16 NOTE — Progress Notes (Signed)
Recreation Therapy Notes  Date: 12.16.19 Time: 1000 Location: 500 Hall Dayroom  Group Topic: Anxiety  Goal Area(s) Addresses:  Patient will identify triggers for anxiety.  Patient will identify physical symptoms of anxiety. Patient will identify coping skills for anxiety.  Intervention: Worksheet  Activity: Introduction to Anxiety.  Patients were to identify the triggers to their anxiety.  Pt were to also identify physical symptoms, thoughts and coping skills for anxiety.  Education: Anxiety, Discharge Planning  Education Outcome: Acknowledges understanding/In group clarification offered/Needs additional education.   Clinical Observations/Feedback: Pt did not attend group.      Jobina Maita, LRT/CTRS         Derrick Cooper 07/16/2018 12:05 PM 
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
## 2018-07-16 NOTE — Progress Notes (Signed)
[facility] MD Progress Note  07/16/2018 11:46 AM Derrick Cooper  MRN:  [id number] Subjective:    Is in need of housing and he resists discharge and though he does acknowledge homicidal thoughts towards his sister he has no plans or intentions states he is angry that she threw him out but of course he understands why at any rate he has no EPS or TD is taking his meds and denies current suicidal thoughts or homicidal intent Understands we will monitor him another day he reports no current hallucinations so we will monitor him 1 more day and probably get him to shelter tomorrow Principal Problem: Schizophrenic disorder Diagnosis: Active Problems:   Schizophrenia (HCC)  Total Time spent with patient: 20 minutes   Past Medical History:  Past Medical History:  Diagnosis Date  . Diabetes mellitus without complication (HCC)   . Hepatitis C   . HIV infection (HCC)   . Schizophrenia (HCC)   . Substance abuse (HCC)    History reviewed. No pertinent surgical history. Family History:  Family History  Problem Relation Age of Onset  . Breast cancer Mother   . Lung cancer Father   . Heart attack Father   Social History:  Social History   Substance and Sexual Activity  Alcohol Use Yes  . Frequency: Never     Social History   Substance and Sexual Activity  Drug Use Yes  . Types: Marijuana, Methamphetamines   Comment: has not used meth since 03/2017, marijuana once or twice a month    Social History   Socioeconomic History  . Marital status: Single    Spouse name: Not on file  . Number of children: 0  . Years of education: 9  . Highest education level: Not on file  Occupational History  . Not on file  Social Needs  . Financial resource strain: Not hard at all  . Food insecurity:    Worry: Never true    Inability: Never true  . Transportation needs:    Medical: Yes    Non-medical: Not on file  Tobacco Use  . Smoking status: Current Every Day Smoker    Packs/day: 0.50    Years:  40.00    Pack years: 20.00    Types: Cigarettes  . Smokeless tobacco: Never Used  . Tobacco comment: "just not ready"  Substance and Sexual Activity  . Alcohol use: Yes    Frequency: Never  . Drug use: Yes    Types: Marijuana, Methamphetamines    Comment: has not used meth since 03/2017, marijuana once or twice a month  . Sexual activity: Not Currently    Comment: declined condoms  Lifestyle  . Physical activity:    Days per week: Not on file    Minutes per session: Not on file  . Stress: Not on file  Relationships  . Social connections:    Talks on phone: Not on file    Gets together: Not on file    Attends religious service: Not on file    Active member of club or organization: Not on file    Attends meetings of clubs or organizations: Not on file    Relationship status: Not on file  Other Topics Concern  . Not on file  Social History Narrative  . Not on file   Additional Social History:                         Sleep: Fair  Appetite:  Fair  Current Medications: Current Facility-Administered Medications  Medication Dose Route Frequency Provider Last Rate Last Dose  . acetaminophen (TYLENOL) tablet 650 mg  650 mg Oral Q6H PRN Money, Gerlene Burdock, FNP   650 mg at 07/14/18 2005  . alum & mag hydroxide-simeth (MAALOX/MYLANTA) 200-200-20 MG/5ML suspension 30 mL  30 mL Oral Q4H PRN Money, Feliz Beam B, FNP      . benztropine (COGENTIN) tablet 0.5 mg  0.5 mg Oral BID Malvin Johns, MD   0.5 mg at 07/16/18 0759  . bictegravir-emtricitabine-tenofovir AF (BIKTARVY) 50-200-25 MG per tablet 1 tablet  1 tablet Oral Daily Money, Gerlene Burdock, FNP   1 tablet at 07/16/18 0759  . cephALEXin (KEFLEX) capsule 500 mg  500 mg Oral BID Money, Gerlene Burdock, FNP   500 mg at 07/16/18 0759  . cloNIDine (CATAPRES) tablet 0.2 mg  0.2 mg Oral Q4H PRN Malvin Johns, MD      . hydrOXYzine (ATARAX/VISTARIL) tablet 25 mg  25 mg Oral TID PRN Money, Gerlene Burdock, FNP   25 mg at 07/15/18 1940  . insulin aspart  (novoLOG) injection 5 Units  5 Units Subcutaneous TID WC Money, Gerlene Burdock, FNP   5 Units at 07/16/18 [phone]  . insulin glargine (LANTUS) injection 30 Units  30 Units Subcutaneous BID Money, Gerlene Burdock, FNP   30 Units at 07/16/18 0800  . magnesium hydroxide (MILK OF MAGNESIA) suspension 30 mL  30 mL Oral Daily PRN Money, Feliz Beam B, FNP      . metFORMIN (GLUCOPHAGE) tablet 500 mg  500 mg Oral Q breakfast Money, Gerlene Burdock, FNP   500 mg at 07/16/18 0759  . metoprolol succinate (TOPROL-XL) 24 hr tablet 100 mg  100 mg Oral Daily Malvin Johns, MD   Stopped at 07/15/18 0815  . nicotine (NICODERM CQ - dosed in mg/24 hours) patch 21 mg  21 mg Transdermal Daily Malvin Johns, MD   21 mg at 07/16/18 0758  . perphenazine (TRILAFON) tablet 6 mg  6 mg Oral TID Malvin Johns, MD   6 mg at 07/16/18 0759  . rosuvastatin (CRESTOR) tablet 5 mg  5 mg Oral Daily Money, Gerlene Burdock, FNP   5 mg at 07/16/18 0759  . temazepam (RESTORIL) capsule 30 mg  30 mg Oral QHS Malvin Johns, MD   30 mg at 07/15/18 2111  . traZODone (DESYREL) tablet 50 mg  50 mg Oral QHS PRN Oneta Rack, NP        Lab Results:  Results for orders placed or performed during the hospital encounter of 07/11/18 (from the past 48 hour(s))  Glucose, capillary     Status: Abnormal   Collection Time: 07/14/18  5:17 PM  Result Value Ref Range   Glucose-Capillary 289 (H) 70 - 99 mg/dL  Glucose, capillary     Status: Abnormal   Collection Time: 07/15/18  6:31 AM  Result Value Ref Range   Glucose-Capillary 241 (H) 70 - 99 mg/dL  Comprehensive metabolic panel     Status: Abnormal   Collection Time: 07/15/18  7:30 AM  Result Value Ref Range   Sodium 136 135 - 145 mmol/L   Potassium 4.2 3.5 - 5.1 mmol/L   Chloride 103 98 - 111 mmol/L   CO2 24 22 - 32 mmol/L   Glucose, Bld 316 (H) 70 - 99 mg/dL   BUN 12 6 - 20 mg/dL   Creatinine, Ser 5.57 0.61 - 1.24 mg/dL   Calcium 9.1 8.9 - 32.2 mg/dL   Total Protein  7.1 6.5 - 8.1 g/dL   Albumin 3.4 (L) 3.5 - 5.0 g/dL   AST  19 15 - 41 U/L   ALT 14 0 - 44 U/L   Alkaline Phosphatase 53 38 - 126 U/L   Total Bilirubin 0.5 0.3 - 1.2 mg/dL   GFR calc non Af Amer >60 >60 mL/min   GFR calc Af Amer >60 >60 mL/min   Anion gap 9 5 - 15    Comment: Performed at [facility], [address], [place], [place] [address]  Glucose, capillary     Status: Abnormal   Collection Time: 07/15/18 12:04 PM  Result Value Ref Range   Glucose-Capillary 199 (H) 70 - 99 mg/dL   Comment 1 Notify RN    Comment 2 Document in Chart   Glucose, capillary     Status: Abnormal   Collection Time: 07/15/18  4:59 PM  Result Value Ref Range   Glucose-Capillary 228 (H) 70 - 99 mg/dL  Glucose, capillary     Status: Abnormal   Collection Time: 07/16/18  5:18 AM  Result Value Ref Range   Glucose-Capillary 208 (H) 70 - 99 mg/dL   Comment 1 Notify RN    Comment 2 Document in Chart     Blood Alcohol level:  Lab Results  Component Value Date   ETH <10 07/09/2018    Metabolic Disorder Labs: No results found for: HGBA1C, MPG No results found for: PROLACTIN Lab Results  Component Value Date   CHOL 173 09/13/2017   TRIG 100 09/13/2017   HDL 29 (L) 09/13/2017   CHOLHDL 6.0 (H) 09/13/2017   LDLCALC 123 (H) 09/13/2017    Physical Findings: AIMS: Facial and Oral Movements Muscles of Facial Expression: None, normal Lips and Perioral Area: None, normal Jaw: None, normal Tongue: None, normal,Extremity Movements Upper (arms, wrists, hands, fingers): None, normal Lower (legs, knees, ankles, toes): None, normal, Trunk Movements Neck, shoulders, hips: None, normal, Overall Severity Severity of abnormal movements (highest score from questions above): None, normal Incapacitation due to abnormal movements: None, normal Patient's awareness of abnormal movements (rate only patient's report): No Awareness, Dental Status Current problems with teeth and/or dentures?: No Does patient usually wear dentures?: No  CIWA:  CIWA-Ar Total:  0 COWS:  COWS Total Score: 0  Musculoskeletal: Strength & Muscle Tone: within normal limits Gait & Station: normal Patient leans: N/A  Psychiatric Specialty Exam: Physical Exam  ROS  Blood pressure 100/71, pulse 80, temperature 97.6 F (36.4 C), temperature source Oral, resp. rate 18, height 6\' 2"  (1.88 m), weight 65.3 kg.Body mass index is 18.49 kg/m.  General Appearance: Casual  Eye Contact:  Good  Speech:  Clear and Coherent  Volume:  Normal  Mood:  Dysphoric  Affect:  Full Range  Thought Process:  Coherent  Orientation:  Full (Time, Place, and Person)  Thought Content:  Tangential  Suicidal Thoughts:  No  Homicidal Thoughts:  No  Memory:  Immediate;   Fair  Judgement:  Fair  Insight:  Fair  Psychomotor Activity:  Decreased  Concentration:  Concentration: Good  Recall:  Good  Fund of Knowledge:  Good  Language:  Good  Akathisia:  Negative  Handed:  Right  AIMS (if indicated):     Assets:  Resilience  ADL's:  Intact  Cognition:  WNL  Sleep:  Number of Hours: 6.75   For depressive symptoms continue cognitive therapy for psychosis continue antipsychotics for housing issues continue to consult with social work  Treatment Plan  Summary: Daily contact with patient to assess and evaluate symptoms and progress in treatment and Medication management  Malvin JohnsFARAH,Toma Arts, MD 07/16/2018, 11:46 AM

## 2018-07-16 NOTE — Progress Notes (Signed)
Inpatient Diabetes Program Recommendations  AACE/ADA: New Consensus Statement on Inpatient Glycemic Control (2015)  Target Ranges:  Prepandial:   less than 140 mg/dL      Peak postprandial:   less than 180 mg/dL (1-2 hours)      Critically ill patients:  140 - 180 mg/dL   Lab Results  Component Value Date   GLUCAP 208 (H) 07/16/2018    Review of Glycemic Control  FBS > 180 mg/dL. Needs insulin adjustment.  Inpatient Diabetes Program Recommendations:     Increase Lantus to 32 units bid.  Will continue to follow closely.  Thank you. Ailene Ardshonda Jana Swartzlander, RD, LDN, CDE Inpatient Diabetes Coordinator [phone]

## 2018-07-17 LAB — GLUCOSE, CAPILLARY
GLUCOSE-CAPILLARY: 240 mg/dL — AB (ref 70–99)
Glucose-Capillary: 382 mg/dL — ABNORMAL HIGH (ref 70–99)

## 2018-07-17 MED ORDER — BICTEGRAVIR-EMTRICITAB-TENOFOV 50-200-25 MG PO TABS: 1.0000 | ORAL_TABLET | Freq: Every day | ORAL | Status: DC

## 2018-07-17 MED ORDER — LISINOPRIL 5 MG PO TABS: 5.0000 mg | ORAL_TABLET | Freq: Every day | ORAL | 0 refills | Status: DC

## 2018-07-17 MED ORDER — INSULIN GLARGINE 100 UNIT/ML ~~LOC~~ SOLN: 30.0000 [IU] | Freq: Two times a day (BID) | SUBCUTANEOUS | 11 refills | Status: DC

## 2018-07-17 MED ORDER — BD PEN NEEDLE MINI U/F 31G X 5 MM MISC: 0 refills | Status: DC

## 2018-07-17 MED ORDER — PANTOPRAZOLE SODIUM 40 MG PO TBEC: 40.0000 mg | DELAYED_RELEASE_TABLET | Freq: Every day | ORAL | 0 refills | Status: DC

## 2018-07-17 MED ORDER — METOPROLOL SUCCINATE ER 100 MG PO TB24: 100.0000 mg | ORAL_TABLET | Freq: Every day | ORAL | Status: DC

## 2018-07-17 MED ORDER — ACCU-CHEK SOFTCLIX LANCETS MISC: 1.0000 | Freq: Three times a day (TID) | 0 refills | Status: DC

## 2018-07-17 MED ORDER — HYDROXYZINE HCL 25 MG PO TABS: 25.0000 mg | ORAL_TABLET | Freq: Three times a day (TID) | ORAL | 0 refills | Status: DC | PRN

## 2018-07-17 MED ORDER — ACCU-CHEK AVIVA PLUS VI STRP: 1.0000 | ORAL_STRIP | Freq: Three times a day (TID) | 1 refills | Status: DC

## 2018-07-17 MED ORDER — BENZTROPINE MESYLATE 0.5 MG PO TABS: 0.5000 mg | ORAL_TABLET | Freq: Two times a day (BID) | ORAL | 0 refills | Status: DC

## 2018-07-17 MED ORDER — ARIPIPRAZOLE ER 400 MG IM SRER
400.0000 mg | Freq: Once | INTRAMUSCULAR | Status: AC
  Administered 2018-07-17: 400 mg via INTRAMUSCULAR

## 2018-07-17 MED ORDER — TEMAZEPAM 30 MG PO CAPS: 30.0000 mg | ORAL_CAPSULE | Freq: Every day | ORAL | 0 refills | Status: DC

## 2018-07-17 MED ORDER — TRAZODONE HCL 50 MG PO TABS: 50.0000 mg | ORAL_TABLET | Freq: Every evening | ORAL | 0 refills | Status: DC | PRN

## 2018-07-17 MED ORDER — ARIPIPRAZOLE ER 400 MG IM SRER: 400.0000 mg | INTRAMUSCULAR | 0 refills | Status: DC

## 2018-07-17 MED ORDER — GLECAPREVIR-PIBRENTASVIR 100-40 MG PO TABS: 3.0000 | ORAL_TABLET | Freq: Every day | ORAL | 0 refills | Status: DC

## 2018-07-17 MED ORDER — NICOTINE 21 MG/24HR TD PT24: 21.0000 mg | MEDICATED_PATCH | Freq: Every day | TRANSDERMAL | 0 refills | Status: DC

## 2018-07-17 MED ORDER — HUMALOG KWIKPEN 100 UNIT/ML ~~LOC~~ SOPN: 15.0000 [IU] | PEN_INJECTOR | Freq: Three times a day (TID) | SUBCUTANEOUS | 0 refills | Status: DC

## 2018-07-17 MED ORDER — "PEN NEEDLES 3/16"" 31G X 5 MM MISC": 1.0000 mL | Freq: Four times a day (QID) | 0 refills | Status: DC

## 2018-07-17 MED ORDER — CEPHALEXIN 500 MG PO CAPS: 500.0000 mg | ORAL_CAPSULE | Freq: Two times a day (BID) | ORAL | Status: DC

## 2018-07-17 MED ORDER — PERPHENAZINE 2 MG PO TABS: 6.0000 mg | ORAL_TABLET | Freq: Three times a day (TID) | ORAL | 0 refills | Status: DC

## 2018-07-17 MED ORDER — ACCU-CHEK AVIVA PLUS W/DEVICE KIT: 1.0000 | PACK | Freq: Three times a day (TID) | 0 refills | Status: DC

## 2018-07-17 MED ORDER — METFORMIN HCL 500 MG PO TABS: 500.0000 mg | ORAL_TABLET | Freq: Every day | ORAL | Status: DC

## 2018-07-17 MED ORDER — ROSUVASTATIN CALCIUM 5 MG PO TABS: 5.0000 mg | ORAL_TABLET | Freq: Every day | ORAL | 1 refills | Status: DC

## 2018-07-17 NOTE — Progress Notes (Signed)
  [facility] :  Will you be returning to the same living situation after discharge:  No. Going to stay with other family. At discharge, do you have transportation home?: Yes,  brother picking up Do you have the ability to pay for your medications: Yes,  Medicaid  Release of information consent forms completed and in the chart; Patient provided shelter resources prior to discharge. [facility]  Patient to Follow up at: Follow-up Information    [facility] Go on 07/20/2018.   Specialty:  Behavioral Health Why:  Your hospital follow up appointment is Friday, 07/20/18 at 10:00a.  Please bring: photo ID, proof of insurance, social security card, and any discharge paperwork from this hospitalization.  Contact information: [address] [place] [address] [phone]           Next level of care provider has access to [facility] Link:no  Safety Planning and Suicide Prevention discussed: Yes,  with patient  Have you used any form of tobacco in the last 30 days? (Cigarettes, Smokeless Tobacco, Cigars, and/or Pipes): Yes  Has patient been referred to the Quitline?: Patient refused referral  Patient has been referred for addiction treatment: Yes  Darreld McleanCharlotte C Luceal Hollibaugh, LCSWA 07/17/2018, 10:31 AM

## 2018-07-17 NOTE — Progress Notes (Signed)
Recreation Therapy Notes  Date: 12.17.19 Time: 1000 Location: 500 Hall Dayroom  Group Topic: Wellness  Goal Area(s) Addresses:  Patient will define components of whole wellness. Patient will verbalize benefit of whole wellness.  Behavioral Response: Engaged  Intervention: Music  Activity: Exercise.  LRT led group in a series of stretches before going into exercises.  Once the stretching was completed, each patient got the opportunity to lead the group in multiple exercises.  Group was given water breaks and told to sit if they felt dizzy at any moment.  Education:Wellness, Discharge Planning.   Education Outcome: Acknowledges education/In group clarification offered/Needs additional education.   Clinical Observations/Feedback: Pt arrived late to group but joined right in with the exercises.  Pt was pleasant and active throughout activity.  Pt was able to lead group in jogging in place.    Caroll RancherMarjette Janaya Broy, LRT/CTRS     Lillia AbedLindsay, Amelita Risinger A 07/17/2018 11:10 AM

## 2018-07-17 NOTE — Progress Notes (Signed)
Patient ID: Derrick Cooper, male   DOB: 06/03/1961, 57 y.o.   MRN: [id number]  Nursing Progress Note [id number]  Data: Patient presents cooperative and compliant with scheduled medications. Patient denies pain/physical complaints. Patient completed self-inventory sheet and rates depression, hopelessness, and anxiety 10,10,10 respectively. Patient rates their sleep and appetite as fair/fair respectively. Patient states goal for today is to "stay positive, pray". Patient is seen attending groups and visible in the milieu. Patient currently denies SI/HI/AVH. Patient irritable towards peer on the unit but compliant with staff requests/directions. Patient to discharge today per MD order.  Action: Patient is educated about and provided medication per provider's orders. Patient safety maintained with q15 min safety checks and frequent rounding. Low fall risk precautions in place. Emotional support given. 1:1 interaction and active listening provided. Patient encouraged to attend meals, groups, and work on treatment plan and goals. Labs, vital signs and patient behavior monitored throughout shift.   Response: Patient remains safe on the unit at this time and agrees to come to staff with any issues/concerns. Will continue to support and monitor.

## 2018-07-17 NOTE — Progress Notes (Signed)
Recreation Therapy Notes  INPATIENT RECREATION TR PLAN  Patient Details Name: Derrick Cooper MRN: [id number] DOB: [birth year] Today's Date: 07/17/2018  Rec Therapy Plan Is patient appropriate for Therapeutic Recreation?: Yes Treatment times per week: about 3 days Estimated Length of Stay: 5-7 days TR Treatment/Interventions: Group participation (Comment)  Discharge Criteria Pt will be discharged from therapy if:: Discharged Treatment plan/goals/alternatives discussed and agreed upon by:: Patient/family  Discharge Summary Short term goals set: See patient care plan Short term goals met: Not met Progress toward goals comments: Groups attended Which groups?: Wellness, Other (Comment)(Therapeutic movie) Reason goals not met: None Therapeutic equipment acquired: N/A Reason patient discharged from therapy: Discharge from hospital Pt/family agrees with progress & goals achieved: Yes Date patient discharged from therapy: 07/17/18    Victorino Sparrow, LRT/CTRS  Ria Comment, Sansa Alkema A 07/17/2018, 11:19 AM

## 2018-07-17 NOTE — Progress Notes (Signed)
Patient ID: Derrick Cooper L Reister, male   DOB: 07/15/1961, 57 y.o.   MRN: [id number]  Discharge Note  D) Patient discharged to lobby. Patient states readiness for discharge. Patient denies SI/HI, AVH and is not delusional or psychotic.   A) Written and verbal discharge instructions given to the patient. Patient accepting to information and verbalized understanding. Patient agrees to the discharge plan. Opportunity for questions and concerns presented to patient. Patient denied any further questions or concerns. All belongings returned to patient. Patient signed for return of belongings and discharge paperwork. Patient has completed their Suicide Safety Plan and has been provided Suicide Prevention Education. Patient provided an opportunity to complete and return Patient Satisfaction Survey.   R) Patient safely escorted to the lobby. [facility], all personal belongings, follow-up appointment in place and discharge paperwork.

## 2018-07-17 NOTE — [facility] Suicide Risk Assessment (Signed)
[facility]   Principal Problem:  Discharge Diagnoses: Active Problems:   Schizophrenia (HCC)   Total Time spent with patient: 45 minutes    Mental Status Per Nursing Assessment::   On Admission:  NA  Demographic Factors:  Male and Low socioeconomic status  Loss Factors: Decrease in vocational status  Historical Factors: Impulsivity  Risk Reduction Factors:   Positive social support  Continued Clinical Symptoms:  Schizophrenia:   Command hallucinatons  Cognitive Features That Contribute To Risk:  Closed-mindedness    Suicide Risk:  Mild:  Suicidal ideation of limited frequency, intensity, duration, and specificity.  There are no identifiable plans, no associated intent, mild dysphoria and related symptoms, good self-control (both objective and subjective assessment), few other risk factors, and identifiable protective factors, including available and accessible social support.  Follow-up Information    [facility] on 07/20/2018.   Specialty:  Behavioral Health Why:  Your hospital follow up appointment is Friday, 07/20/18 at 10:00a.  Please bring: photo ID, proof of insurance, social security card, and any discharge paperwork from this hospitalization.  Contact information: [address] [place] [address] [phone]           Plan Of Care/Follow-up recommendations:  Activity:  full  Shiela Bruns, MD 07/17/2018, 8:59 AM

## 2018-07-17 NOTE — Plan of Care (Signed)
Pt did not identify any positive coping skills.    Caroll RancherMarjette Alayiah Fontes, LRT/CTRS
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
## 2018-08-08 ENCOUNTER — Other Ambulatory Visit: Payer: Self-pay

## 2018-08-08 ENCOUNTER — Emergency Department (HOSPITAL_COMMUNITY)
Admission: EM | Admit: 2018-08-08 | Discharge: 2018-08-09 | Disposition: A | Discharge status: Other Acute Inpatient Hospital | Payer: Medicaid Other | Attending: Emergency Medicine | Admitting: Emergency Medicine

## 2018-08-08 DIAGNOSIS — F10120 Alcohol abuse with intoxication, uncomplicated: Secondary | ICD-10-CM | POA: Insufficient documentation

## 2018-08-08 DIAGNOSIS — R45850 Homicidal ideations: Secondary | ICD-10-CM | POA: Insufficient documentation

## 2018-08-08 DIAGNOSIS — R45851 Suicidal ideations: Secondary | ICD-10-CM | POA: Insufficient documentation

## 2018-08-08 DIAGNOSIS — F209 Schizophrenia, unspecified: Secondary | ICD-10-CM | POA: Diagnosis present

## 2018-08-08 DIAGNOSIS — Z794 Long term (current) use of insulin: Secondary | ICD-10-CM | POA: Insufficient documentation

## 2018-08-08 DIAGNOSIS — B20 Human immunodeficiency virus [HIV] disease: Secondary | ICD-10-CM | POA: Insufficient documentation

## 2018-08-08 DIAGNOSIS — E1140 Type 2 diabetes mellitus with diabetic neuropathy, unspecified: Secondary | ICD-10-CM | POA: Insufficient documentation

## 2018-08-08 DIAGNOSIS — F10929 Alcohol use, unspecified with intoxication, unspecified: Secondary | ICD-10-CM

## 2018-08-08 DIAGNOSIS — Z79899 Other long term (current) drug therapy: Secondary | ICD-10-CM | POA: Insufficient documentation

## 2018-08-08 DIAGNOSIS — F17210 Nicotine dependence, cigarettes, uncomplicated: Secondary | ICD-10-CM | POA: Insufficient documentation

## 2018-08-08 LAB — COMPREHENSIVE METABOLIC PANEL
ALT: 13 U/L (ref 0–44)
AST: 23 U/L (ref 15–41)
Albumin: 3.8 g/dL (ref 3.5–5.0)
Alkaline Phosphatase: 58 U/L (ref 38–126)
Anion gap: 11 (ref 5–15)
BUN: 10 mg/dL (ref 6–20)
CO2: 27 mmol/L (ref 22–32)
Calcium: 9.5 mg/dL (ref 8.9–10.3)
Chloride: 108 mmol/L (ref 98–111)
Creatinine, Ser: 0.84 mg/dL (ref 0.61–1.24)
GFR calc Af Amer: >60 mL/min (ref >60)
GFR calc non Af Amer: >60 mL/min (ref >60)
Glucose, Bld: 118 mg/dL — ABNORMAL HIGH (ref 70–99)
Potassium: 3.5 mmol/L (ref 3.5–5.1)
Sodium: 146 mmol/L — ABNORMAL HIGH (ref 135–145)
Total Bilirubin: 0.5 mg/dL (ref 0.3–1.2)
Total Protein: 8 g/dL (ref 6.5–8.1)

## 2018-08-08 LAB — CBC
HCT: 43.1 % (ref 39.0–52.0)
Hemoglobin: 14 g/dL (ref 13.0–17.0)
MCH: 30 pg (ref 26.0–34.0)
MCHC: 32.5 g/dL (ref 30.0–36.0)
MCV: 92.5 fL (ref 80.0–100.0)
NRBC: 0 % (ref 0.0–0.2)
Platelets: 210 10*3/uL (ref 150–400)
RBC: 4.66 MIL/uL (ref 4.22–5.81)
RDW: 13.1 % (ref 11.5–15.5)
WBC: 7.2 10*3/uL (ref 4.0–10.5)

## 2018-08-08 LAB — RAPID URINE DRUG SCREEN, HOSP PERFORMED
Amphetamines: NOT DETECTED
BENZODIAZEPINES: NOT DETECTED
Barbiturates: NOT DETECTED
Cocaine: NOT DETECTED
Opiates: NOT DETECTED
Tetrahydrocannabinol: POSITIVE — AB

## 2018-08-08 LAB — SALICYLATE LEVEL: Salicylate Lvl: <7 mg/dL (ref 2.8–30.0)

## 2018-08-08 LAB — ACETAMINOPHEN LEVEL: Acetaminophen (Tylenol), Serum: <10 ug/mL — ABNORMAL LOW (ref 10–30)

## 2018-08-08 LAB — CBG MONITORING, ED: Glucose-Capillary: 146 mg/dL — ABNORMAL HIGH (ref 70–99)

## 2018-08-08 LAB — ETHANOL: Alcohol, Ethyl (B): 173 mg/dL — ABNORMAL HIGH (ref <10)

## 2018-08-08 NOTE — ED Triage Notes (Signed)
Pt brought in by [employer] from the streets. Pt reports to RN that he is SI, HI and wants to burn down his sister's house. 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

## 2018-08-08 NOTE — BH Assessment (Signed)
Assessment Note  Derrick Cooper is an 58 y.o. male presenting with SI and HI with plan to burn sisters house down. Patient initially presented with SI during triage, during assessment with clinician patient denied SI and admitted to HI towards sister wanting burn his sisters house down. 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 Patient reports current stressors to include: being asked to leave his sister's residence 1 month ago due to medication non-compliance and increased depression associated with MH issues. Patient reports continual thoughts of burning something and not always having a target. Patient was discharged from [facility] on 07/17/18. Patient reported prior to last admission that he was off medications for 8 months. Patient reported being out of medications and not getting his last shot. Patient reported missing his last appointment at [facility] on 07/14/18. Patient reported "crazy thoughts in my head, wanting to burn stuff". Patient reported using marijuana 3 days a week and alcohol 2 days a week. Patient was drowsy and cooperative during assessment.   UDS in process ETOH BAL 173  Diagnosis: Schizophrenia  Past Medical History:  Past Medical History:  Diagnosis Date  . Diabetes mellitus without complication (HCC)   . Hepatitis C   . HIV infection (HCC)   . Schizophrenia (HCC)   . Substance abuse (HCC)     No past surgical history on file.  Family History:  Family History  Problem Relation Age of Onset  . Breast cancer Mother   . Lung cancer Father   . Heart attack Father     Social History:  reports that he has been smoking cigarettes. He has a 20.00 pack-year smoking history. He has never used smokeless tobacco. He reports current alcohol use. He reports current drug use. Drugs: Marijuana and Methamphetamines.  Additional Social History:  Alcohol / Drug Use Pain Medications: see MAR Prescriptions: see MAR Over the Counter: see MAR  CIWA: CIWA-Ar BP: 105/63 Pulse Rate:  89 COWS:    Allergies: No Known Allergies  Home Medications: (Not in a hospital admission)   OB/GYN Status:  No LMP for male patient.  General Assessment Data [facility] of Assessment: [facility] TTS Assessment: In system Is this a Tele or Face-to-Face Assessment?: Face-to-Face Is this an Initial Assessment or a Re-assessment for this encounter?: Initial Assessment Patient Accompanied by:: N/A Language Other than English: No Living Arrangements: Homeless/Shelter What gender do you identify as?: Male Marital status: Single Living Arrangements: (homeless) Can pt return to current living arrangement?: Yes Admission Status: Voluntary Is patient capable of signing voluntary admission?: Yes Referral Source: Self/Family/Friend     Crisis Care Plan Living Arrangements: (homeless) Legal Guardian: (self) Name of Psychiatrist: None Name of Therapist: None  Education Status Is patient currently in school?: No Is the patient employed, unemployed or receiving disability?: Receiving disability income  Risk to self with the past 6 months Suicidal Ideation: No Has patient been a risk to self within the past 6 months prior to admission? : No Suicidal Intent: No Has patient had any suicidal intent within the past 6 months prior to admission? : No Is patient at risk for suicide?: No Suicidal Plan?: No Has patient had any suicidal plan within the past 6 months prior to admission? : No Access to Means: No What has been your use of drugs/alcohol within the last 12 months?: (marijuana and alcohol) Previous Attempts/Gestures: No How many times?: (0) Triggers for Past Attempts: Unknown Intentional Self Injurious Behavior: None Family Suicide History: No Recent stressful life event(s): Financial  Problems(off medication and mental illness) Persecutory voices/beliefs?: No Depression: Yes Depression Symptoms: Isolating, Insomnia, Feeling worthless/self pity, Loss of interest in usual pleasures,  Fatigue Substance abuse history and/or treatment for substance abuse?: No  Risk to Others within the past 6 months Homicidal Ideation: Yes-Currently Present Does patient have any lifetime risk of violence toward others beyond the six months prior to admission? : Yes (comment) Thoughts of Harm to Others: Yes-Currently Present Comment - Thoughts of Harm to Others: (burn down sisters house) Current Homicidal Intent: Yes-Currently Present Current Homicidal Plan: Yes-Currently Present Describe Current Homicidal Plan: (burn down sisters house) Access to Homicidal Means: Yes Describe Access to Homicidal Means: (burn down sisters house) Identified Victim: (sister) History of harm to others?: No Assessment of Violence: None Noted Does patient have access to weapons?: No Criminal Charges Pending?: No Does patient have a court date: No Is patient on probation?: No  Psychosis Hallucinations: None noted Delusions: None noted  Mental Status Report Appearance/Hygiene: In scrubs Eye Contact: Poor Motor Activity: Unremarkable Speech: Soft, Slow Level of Consciousness: Drowsy Mood: Depressed Affect: Appropriate to circumstance Anxiety Level: Minimal Thought Processes: Relevant Judgement: Partial Orientation: Person, Place, Time, Situation Obsessive Compulsive Thoughts/Behaviors: None  Cognitive Functioning Concentration: Fair Memory: Unable to Assess Is patient IDD: No Insight: Poor Impulse Control: Poor Appetite: Fair Have you had any weight changes? : No Change Sleep: Decreased Total Hours of Sleep: (none in 4 days) Vegetative Symptoms: None  ADLScreening [facility] Assessment Services) Patient's cognitive ability adequate to safely complete daily activities?: Yes Patient able to express need for assistance with ADLs?: Yes Independently performs ADLs?: Yes (appropriate for developmental age)  Prior Inpatient Therapy Prior Inpatient Therapy: Yes Prior Therapy Dates: Pt cannot  recall(1 month ago) Prior Therapy Facilty/Provider(s): Pt cannot recall([facility]) Reason for Treatment: MH issues  Prior Outpatient Therapy Prior Outpatient Therapy: Yes Prior Therapy Dates: 2018 Prior Therapy Facilty/Provider(s): [facility] Reason for Treatment: Med mang Does patient have an ACCT team?: No Does patient have Intensive In-House Services?  : No Does patient have [facility] services? : Yes(07/14/18 missed appointment) Does patient have P4CC services?: No  ADL Screening (condition at time of admission) Patient's cognitive ability adequate to safely complete daily activities?: Yes Patient able to express need for assistance with ADLs?: Yes Independently performs ADLs?: Yes (appropriate for developmental age)    Merchant navy officer (For Healthcare) Does Patient Have a Medical Advance Directive?: No Would patient like information on creating a medical advance directive?: No - Patient declined    Disposition:  Disposition Initial Assessment Completed for this Encounter: Yes  Donell Sievert, PA, patient meets inpatient criteria. TTS to secure placement. RN informed of disposition.   Burnetta Sabin, [facility] 08/08/2018 9:41 PM

## 2018-08-08 NOTE — ED Notes (Signed)
Pt is blind in Left eye.

## 2018-08-08 NOTE — ED Notes (Signed)
Dr Wentz at bedside to eval pt. 

## 2018-08-08 NOTE — ED Notes (Signed)
Derrick Cooper at [facility] accepted patient to [facility], Room 504 Bed 1. Attending is Dr. Jeannine Kitten. Arrival time: 11:30pm. Joanie Coddington, RN, informed of disposition.

## 2018-08-08 NOTE — ED Provider Notes (Signed)
[facility] DEPT Provider Note   CSN: [id number] Arrival date & time: 08/08/18  1808     History   Chief Complaint Chief Complaint  Patient presents with  . Homicidal  . Suicidal    HPI Derrick Cooper is a 58 y.o. male.  HPI   Patient states he has had some thoughts of burning something but "I do not have a target."  He is frustrated because he is living on the street since his sister kicked him out of her home, 1 month ago.  He was [facility] the behavioral [facility] on 07/17/2018.  He denies suicidal ideation active homicidal ideation, cough, fever, nausea or vomiting.  There are no other known modifying factors.  Past Medical History:  Diagnosis Date  . Diabetes mellitus without complication ([place])   . Hepatitis C   . HIV infection ([place])   . Schizophrenia ([place])   . Substance abuse [facility])     Patient Active Problem List   Diagnosis Date Noted  . Health care maintenance 06/26/2018  . Tobacco use 06/26/2018  . Furuncle of right axilla 11/13/2017  . Hyperlipidemia associated with type 2 diabetes mellitus ([place]) 10/25/2017  . 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 HIV disease ([place]) 09/27/2017  . Chronic hepatitis C without hepatic coma ([place]) 09/27/2017  . Acute non-recurrent maxillary sinusitis 09/27/2017  . Schizophrenia ([place]) 09/07/2017  . Uncontrolled type 2 diabetes mellitus with microalbuminuria, with long-term current use of insulin ([place]) 09/07/2017  . Urinary incontinence 09/07/2017    No past surgical history on file.      Home Medications    Prior to Admission medications   Medication Sig Start Date End Date Taking? Authorizing Provider  ARIPiprazole ER (ABILIFY MAINTENA) 400 MG SRER injection Inject 2 mLs (400 mg total) into the muscle every 14 (fourteen) days. (Due on 07-31-18): For mood control 07/31/18  Yes Nwoko, Herbert Pun  I, NP  bictegravir-emtricitabine-tenofovir AF (BIKTARVY) 50-200-25 MG TABS tablet Take 1 tablet by mouth daily. For HIV infection 07/18/18  Yes Nwoko, Loleta Dicker, NP  HUMALOG KWIKPEN 100 UNIT/ML KwikPen Inject 0.15 mLs (15 Units total) into the skin 3 (three) times daily. Before meals: For diabetes management 07/17/18  Yes Lindell Spar I, NP  hydrOXYzine (ATARAX/VISTARIL) 25 MG tablet Take 1 tablet (25 mg total) by mouth 3 (three) times daily as needed for anxiety. 07/17/18  Yes Lindell Spar I, NP  insulin glargine (LANTUS) 100 UNIT/ML injection Inject 0.3 mLs (30 Units total) into the skin 2 (two) times daily. For diabetes management Patient taking differently: Inject 60 Units into the skin 2 (two) times daily. For diabetes management 07/17/18  Yes Lindell Spar I, NP  ACCU-CHEK AVIVA PLUS test strip 1 each by Other route 3 (three) times daily. As directed: For diabetes management 07/17/18   Lindell Spar I, NP  ACCU-CHEK SOFTCLIX LANCETS lancets 1 each by Other route 3 (three) times daily. As directed: For diabetes management 07/17/18   Lindell Spar I, NP  B-D UF III MINI PEN NEEDLES 31G X 5 MM MISC USE AS DIRECTED QID: For diabetes management 07/17/18   Lindell Spar I, NP  benztropine (COGENTIN) 0.5 MG tablet Take 1 tablet (0.5 mg total) by mouth 2 (two) times daily. For prevention of drug induced tremors Patient not taking: Reported on 08/08/2018 07/17/18   Lindell Spar I, NP  Blood Glucose Monitoring Suppl (ACCU-CHEK AVIVA PLUS) w/Device KIT  Apply 1 Device topically 3 (three) times daily. As directed: Diabetes management 07/17/18   Lindell Spar I, NP  cephALEXin (KEFLEX) 500 MG capsule Take 1 capsule (500 mg total) by mouth 2 (two) times daily. For infection Patient not taking: Reported on 08/08/2018 07/17/18   Lindell Spar I, NP  Glecaprevir-Pibrentasvir (MAVYRET) 100-40 MG TABS Take 3 tablets by mouth daily. For Hep C infection Patient not taking: Reported on 08/08/2018 07/17/18   Lindell Spar I, NP    Insulin Pen Needle (PEN NEEDLES 3/16") 31G X 5 MM MISC 1 mL by Does not apply route QID. For diabetes management 07/17/18   Lindell Spar I, NP  lisinopril (PRINIVIL,ZESTRIL) 5 MG tablet Take 1 tablet (5 mg total) by mouth daily. For high blood pressure Patient not taking: Reported on 08/08/2018 07/17/18   Lindell Spar I, NP  metFORMIN (GLUCOPHAGE) 500 MG tablet Take 1 tablet (500 mg total) by mouth daily with breakfast. For diabetes managment Patient not taking: Reported on 08/08/2018 07/18/18   Lindell Spar I, NP  metoprolol succinate (TOPROL-XL) 100 MG 24 hr tablet Take 1 tablet (100 mg total) by mouth daily. For high blood pressureTake with or immediately following a meal. Patient not taking: Reported on 08/08/2018 07/18/18   Lindell Spar I, NP  nicotine (NICODERM CQ - DOSED IN MG/24 HOURS) 21 mg/24hr patch Place 1 patch (21 mg total) onto the skin daily. (May buy from over the counter): For smoking cessation Patient not taking: Reported on 08/08/2018 07/18/18   Lindell Spar I, NP  pantoprazole (PROTONIX) 40 MG tablet Take 1 tablet (40 mg total) by mouth daily. For acid reflux Patient not taking: Reported on 08/08/2018 07/18/18   Lindell Spar I, NP  perphenazine (TRILAFON) 2 MG tablet Take 3 tablets (6 mg total) by mouth 3 (three) times daily. For mood control Patient not taking: Reported on 08/08/2018 07/17/18   Lindell Spar I, NP  rosuvastatin (CRESTOR) 5 MG tablet Take 1 tablet (5 mg total) by mouth daily. For high cholesterol Patient not taking: Reported on 08/08/2018 07/17/18   Lindell Spar I, NP  temazepam (RESTORIL) 30 MG capsule Take 1 capsule (30 mg total) by mouth at bedtime. For sleep Patient not taking: Reported on 08/08/2018 07/17/18   Lindell Spar I, NP  traZODone (DESYREL) 50 MG tablet Take 1 tablet (50 mg total) by mouth at bedtime as needed for sleep. Patient not taking: Reported on 08/08/2018 07/17/18   Lindell Spar I, NP    Family History Family History  Problem Relation Age of Onset  .  Breast cancer Mother   . Lung cancer Father   . Heart attack Father     Social History Social History   Tobacco Use  . Smoking status: Current Every Day Smoker    Packs/day: 0.50    Years: 40.00    Pack years: 20.00    Types: Cigarettes  . Smokeless tobacco: Never Used  . Tobacco comment: "just not ready"  Substance Use Topics  . Alcohol use: Yes    Frequency: Never  . Drug use: Yes    Types: Marijuana, Methamphetamines    Comment: has not used meth since 03/2017, marijuana once or twice a month     Allergies   Patient has no known allergies.   Review of Systems Review of Systems  All other systems reviewed and are negative.    Physical Exam Updated Vital Signs BP 105/63 (BP Location: Right Arm)   Pulse 89   Temp 98.1 F (  36.7 C)   Resp 16   SpO2 99%   Physical Exam Vitals signs and nursing note reviewed.  Constitutional:      Appearance: He is well-developed. He is not ill-appearing or diaphoretic.  HENT:     Head: Normocephalic and atraumatic.     Right Ear: External ear normal.     Left Ear: External ear normal.  Eyes:     General:        Right eye: No discharge.     Conjunctiva/sclera: Conjunctivae normal.     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     Trachea: Phonation normal.  Cardiovascular:     Rate and Rhythm: Normal rate.  Pulmonary:     Effort: Pulmonary effort is normal.     Breath sounds: No stridor.  Abdominal:     Palpations: Abdomen is soft.     Tenderness: There is no abdominal tenderness.  Musculoskeletal: Normal range of motion.  Skin:    General: Skin is warm and dry.  Neurological:     Mental Status: He is alert and oriented to person, place, and time.     Cranial Nerves: No cranial nerve deficit.     Sensory: No sensory deficit.     Motor: No abnormal muscle tone.     Coordination: Coordination normal.  Psychiatric:        Mood and Affect: Mood  normal.        Behavior: Behavior normal.     Comments: He is not responding to internal stimuli      ED Treatments / Results  Labs (all labs ordered are listed, but only abnormal results are displayed) Labs Reviewed  COMPREHENSIVE METABOLIC PANEL - Abnormal; Notable for the following components:      Result Value   Sodium 146 (*)    Glucose, Bld 118 (*)    All other components within normal limits  ETHANOL - Abnormal; Notable for the following components:   Alcohol, Ethyl (B) 173 (*)    All other components within normal limits  ACETAMINOPHEN LEVEL - Abnormal; Notable for the following components:   Acetaminophen (Tylenol), Serum <10 (*)    All other components within normal limits  SALICYLATE LEVEL  CBC  RAPID URINE DRUG SCREEN, HOSP PERFORMED    EKG None  Radiology No results found.  Procedures Procedures (including critical care time)  Medications Ordered in ED Medications - No data to display   Initial Impression / Assessment and Plan / ED Course  I have reviewed the triage vital signs and the nursing notes.  Pertinent labs & imaging results that were available during my care of the patient were reviewed by me and considered in my medical decision making (see chart for details).  Clinical Course as of Aug 08 2054  Wed Aug 08, 2018  2026 Normal  Acetaminophen level(!) [EW]  2027 Abnormal, high  Ethanol(!) [EW]  2027 Normal  cbc [EW]  1696 Normal  Salicylate level [EW]  2027 Normal except sodium high, glucose high  Comprehensive metabolic panel(!) [EW]  7893 He is medically cleared for treatment by psychiatry   [EW]    Clinical Course User Index [EW] Daleen Bo, MD     Patient Vitals for the past 24 hrs:  BP Temp Pulse Resp SpO2  08/08/18 1818 105/63 98.1 F (36.7 C) 89 16 99 %   TTS consult-8:45 PM  8:51 PM Reevaluation with  update and discussion. After initial assessment and treatment, an updated evaluation reveals no change in clinical  status. Daleen Bo   Medical Decision Making: Thoughts of arson, he states he is not taking his medicines.  He received a 30-day injection of Abilify, around 07/16/2018.  It is difficult to ascertain if he is taking his perphenazine which she has been prescribed to take 3 times daily.  No active psychosis is seen.  CRITICAL CARE-no Performed by: Daleen Bo  Nursing Notes Reviewed/ Care Coordinated Applicable Imaging Reviewed Interpretation of Laboratory Data incorporated into ED treatment  Plan: As per TTS in conjunction with oncoming provider team.  Final Clinical Impressions(s) / ED Diagnoses   Final diagnoses:  Schizophrenia, unspecified type [facility])  Alcoholic intoxication with complication [facility])    ED Discharge Orders    None       Daleen Bo, MD 08/08/18 2056

## 2018-08-08 NOTE — ED Notes (Signed)
Bed: WA28 Expected date:  Expected time:  Means of arrival:  Comments: 

## 2018-08-08 NOTE — ED Notes (Signed)
Simon Spencer, PA, patient meets inpatient criteria. TTS to secure placement. RN informed of disposition. 

## 2018-08-08 NOTE — ED Notes (Signed)
Pt signed voluntary paperwork 

## 2018-08-08 NOTE — ED Notes (Signed)
Report called to RN Kathlene November, [facility], rm [phone].  Pending [facility] transport x 12.30am.

## 2018-08-08 NOTE — ED Notes (Signed)
Report from [employer].

## 2018-08-08 NOTE — ED Notes (Signed)
Pt sleeping at present, no distress noted, calm & cooperative.  Monitoring for safety, sitter at bedside.

## 2018-08-09 ENCOUNTER — Other Ambulatory Visit: Payer: Self-pay

## 2018-08-09 ENCOUNTER — Encounter (HOSPITAL_COMMUNITY): Payer: Self-pay

## 2018-08-09 ENCOUNTER — Inpatient Hospital Stay (HOSPITAL_COMMUNITY)
Admission: AD | Admit: 2018-08-09 | Discharge: 2018-08-11 | DRG: 885 | Disposition: A | Discharge status: Home / Self Care | Payer: Medicaid Other | Source: Intra-hospital | Attending: Psychiatry | Admitting: Psychiatry

## 2018-08-09 DIAGNOSIS — R45850 Homicidal ideations: Secondary | ICD-10-CM | POA: Diagnosis present

## 2018-08-09 DIAGNOSIS — R45851 Suicidal ideations: Secondary | ICD-10-CM | POA: Diagnosis present

## 2018-08-09 DIAGNOSIS — E119 Type 2 diabetes mellitus without complications: Secondary | ICD-10-CM | POA: Diagnosis present

## 2018-08-09 DIAGNOSIS — Z9114 Patient's other noncompliance with medication regimen: Secondary | ICD-10-CM | POA: Diagnosis not present

## 2018-08-09 DIAGNOSIS — Z8249 Family history of ischemic heart disease and other diseases of the circulatory system: Secondary | ICD-10-CM

## 2018-08-09 DIAGNOSIS — B1920 Unspecified viral hepatitis C without hepatic coma: Secondary | ICD-10-CM | POA: Diagnosis present

## 2018-08-09 DIAGNOSIS — Z794 Long term (current) use of insulin: Secondary | ICD-10-CM

## 2018-08-09 DIAGNOSIS — F250 Schizoaffective disorder, bipolar type: Secondary | ICD-10-CM | POA: Diagnosis present

## 2018-08-09 DIAGNOSIS — Z21 Asymptomatic human immunodeficiency virus [HIV] infection status: Secondary | ICD-10-CM | POA: Diagnosis present

## 2018-08-09 DIAGNOSIS — F17210 Nicotine dependence, cigarettes, uncomplicated: Secondary | ICD-10-CM | POA: Diagnosis present

## 2018-08-09 DIAGNOSIS — Z590 Homelessness: Secondary | ICD-10-CM

## 2018-08-09 DIAGNOSIS — Z803 Family history of malignant neoplasm of breast: Secondary | ICD-10-CM | POA: Diagnosis not present

## 2018-08-09 DIAGNOSIS — F1290 Cannabis use, unspecified, uncomplicated: Secondary | ICD-10-CM | POA: Diagnosis present

## 2018-08-09 DIAGNOSIS — Z801 Family history of malignant neoplasm of trachea, bronchus and lung: Secondary | ICD-10-CM | POA: Diagnosis not present

## 2018-08-09 DIAGNOSIS — F332 Major depressive disorder, recurrent severe without psychotic features: Secondary | ICD-10-CM | POA: Diagnosis present

## 2018-08-09 LAB — GLUCOSE, CAPILLARY: Glucose-Capillary: 149 mg/dL — ABNORMAL HIGH (ref 70–99)

## 2018-08-09 MED ORDER — TRAZODONE HCL 50 MG PO TABS: 50.0000 mg | ORAL_TABLET | Freq: Every evening | ORAL | Status: DC | PRN

## 2018-08-09 MED ORDER — ACETAMINOPHEN 325 MG PO TABS: 650.0000 mg | ORAL_TABLET | Freq: Four times a day (QID) | ORAL | Status: DC | PRN

## 2018-08-09 MED ORDER — HYDROXYZINE HCL 25 MG PO TABS: 25.0000 mg | ORAL_TABLET | Freq: Three times a day (TID) | ORAL | Status: DC | PRN

## 2018-08-09 MED ORDER — INSULIN GLARGINE 100 UNIT/ML ~~LOC~~ SOLN
30.0000 [IU] | Freq: Two times a day (BID) | SUBCUTANEOUS | Status: DC
  Administered 2018-08-09 – 2018-08-11 (×3): 30 [IU] via SUBCUTANEOUS

## 2018-08-09 MED ORDER — TEMAZEPAM 15 MG PO CAPS
30.0000 mg | ORAL_CAPSULE | Freq: Every day | ORAL | Status: DC
  Administered 2018-08-10: 30 mg via ORAL

## 2018-08-09 MED ORDER — ARIPIPRAZOLE ER 400 MG IM SRER
400.0000 mg | INTRAMUSCULAR | Status: DC
  Administered 2018-08-09: 400 mg via INTRAMUSCULAR

## 2018-08-09 MED ORDER — METFORMIN HCL 500 MG PO TABS
500.0000 mg | ORAL_TABLET | Freq: Every day | ORAL | Status: DC
  Administered 2018-08-10 – 2018-08-11 (×2): 500 mg via ORAL
  Filled 2018-08-09 (×5): qty 1

## 2018-08-09 MED ORDER — HYDROXYZINE HCL 50 MG PO TABS: 50.0000 mg | ORAL_TABLET | Freq: Four times a day (QID) | ORAL | Status: DC | PRN

## 2018-08-09 MED ORDER — NICOTINE 21 MG/24HR TD PT24
21.0000 mg | MEDICATED_PATCH | Freq: Every day | TRANSDERMAL | Status: DC
  Administered 2018-08-09 – 2018-08-11 (×3): 21 mg via TRANSDERMAL
  Filled 2018-08-09 (×4): qty 1

## 2018-08-09 MED ORDER — METOPROLOL SUCCINATE ER 50 MG PO TB24
50.0000 mg | ORAL_TABLET | Freq: Every day | ORAL | Status: DC
  Administered 2018-08-09 – 2018-08-11 (×3): 50 mg via ORAL
  Filled 2018-08-09 (×6): qty 1

## 2018-08-09 MED ORDER — ALUM & MAG HYDROXIDE-SIMETH 200-200-20 MG/5ML PO SUSP: 30.0000 mL | ORAL | Status: DC | PRN

## 2018-08-09 MED ORDER — MAGNESIUM HYDROXIDE 400 MG/5ML PO SUSP: 30.0000 mL | Freq: Every day | ORAL | Status: DC | PRN

## 2018-08-09 MED ORDER — INSULIN ASPART PROT & ASPART (70-30 MIX) 100 UNIT/ML ~~LOC~~ SUSP
15.0000 [IU] | Freq: Three times a day (TID) | SUBCUTANEOUS | Status: DC
  Administered 2018-08-09 – 2018-08-11 (×3): 15 [IU] via SUBCUTANEOUS

## 2018-08-09 MED ORDER — TRAZODONE HCL 50 MG PO TABS
50.0000 mg | ORAL_TABLET | Freq: Every evening | ORAL | Status: DC | PRN
  Administered 2018-08-10: 50 mg via ORAL
  Filled 2018-08-09 (×8): qty 1

## 2018-08-09 MED ORDER — BENZTROPINE MESYLATE 0.5 MG PO TABS
0.5000 mg | ORAL_TABLET | Freq: Two times a day (BID) | ORAL | Status: DC
  Administered 2018-08-09 – 2018-08-11 (×5): 0.5 mg via ORAL
  Filled 2018-08-09 (×9): qty 1

## 2018-08-09 MED ORDER — MAGNESIUM HYDROXIDE 400 MG/5ML PO SUSP
30.0000 mL | Freq: Every day | ORAL | Status: DC | PRN
  Filled 2018-08-09: qty 30

## 2018-08-09 MED ORDER — PERPHENAZINE 8 MG PO TABS
8.0000 mg | ORAL_TABLET | Freq: Three times a day (TID) | ORAL | Status: DC
  Administered 2018-08-09 – 2018-08-11 (×6): 8 mg via ORAL
  Filled 2018-08-09: qty 1
  Filled 2018-08-09 (×2): qty 2
  Filled 2018-08-09 (×11): qty 1

## 2018-08-09 NOTE — Progress Notes (Signed)
[facility] Group Therapy 08/09/2018 2:10 PM  Type of Therapy: Mental [employer] Presentation  Participation Level: Active  Participation Quality: Attentive  Affect: Appropriate  Cognitive: Oriented  Insight: Developing/Improving  Engagement in Therapy: Engaged  Modes of Intervention: Discussion, Education and Socialization   Summary of Progress/Problems: Mental [employer] Association (MHA) Speaker came to talk about his personal journey with mental health. The pt processed ways by which to relate to the speaker. MHA speaker provided handouts and educational information pertaining to groups and services offered by the MHA. Pt was engaged in speaker's presentation and was receptive to resources provided.   Tonyia Marschall, MSW, LCSWA 08/09/2018 2:10 PM 

## 2018-08-09 NOTE — [facility] Suicide Risk Assessment (Signed)
[facility] Admission Suicide Risk Assessment   Nursing information obtained from:  Patient Demographic factors:  Male, Low socioeconomic status, Living alone, Unemployed Current Mental Status:  NA Loss Factors:  Decline in physical health, Financial problems / change in socioeconomic status Historical Factors:  Family history of mental illness or substance abuse Risk Reduction Factors:  Positive coping skills or problem solving skills  Total Time spent with patient: 30 minutes Principal Problem: <principal problem not specified> Diagnosis:  Active Problems:   MDD (major depressive disorder), recurrent episode, severe (HCC)   Schizoaffective disorder, bipolar type (HCC)  Subjective Data: Patient generally cooperative reports noncompliance with psychotropic meds resulting in an exacerbation  Continued Clinical Symptoms:  Alcohol Use Disorder Identification Test Final Score (AUDIT): 2 The "Alcohol Use Disorders Identification Test", Guidelines for Use in Primary Care, Second Edition.  World Science writer [facility]). Score between 0-7:  no or low risk or alcohol related problems. Score between 8-15:  moderate risk of alcohol related problems. Score between 16-19:  high risk of alcohol related problems. Score 20 or above:  warrants further diagnostic evaluation for alcohol dependence and treatment.   CLINICAL FACTORS:   Schizophrenia:   Paranoid or undifferentiated type       COGNITIVE FEATURES THAT CONTRIBUTE TO RISK:  Or executive functioning  SUICIDE RISK:   Minimal: No identifiable suicidal ideation.  Patients presenting with no risk factors but with morbid ruminations; may be classified as minimal risk based on the severity of the depressive symptoms  PLAN OF CARE: See HPI treatment team notes as well  I certify that inpatient services furnished can reasonably be expected to improve the patient's condition.   Malvin Johns, MD 08/09/2018, 10:37 AM

## 2018-08-09 NOTE — Progress Notes (Signed)
Did not attend group 
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
## 2018-08-09 NOTE — Tx Team (Signed)
Initial Treatment Plan 08/09/2018 3:57 AM Derrick Cooper HTX:[id number]    PATIENT STRESSORS: Health problems Medication change or noncompliance Substance abuse   PATIENT STRENGTHS: General fund of knowledge Motivation for treatment/growth   PATIENT IDENTIFIED PROBLEMS: Risk for suicide  SA  Psychosis  Medication compliance  "getting on right medication, getting someone to help me manage my money , somewhere to stay"             DISCHARGE CRITERIA:  Adequate post-discharge living arrangements Improved stabilization in mood, thinking, and/or behavior Verbal commitment to aftercare and medication compliance  PRELIMINARY DISCHARGE PLAN: Attend aftercare/continuing care group Attend PHP/IOP Placement in alternative living arrangements  PATIENT/FAMILY INVOLVEMENT: This treatment plan has been presented to and reviewed with the patient, Derrick Cooper.  The patient and family have been given the opportunity to ask questions and make suggestions.  Delos Haring, RN 08/09/2018, 3:57 AM

## 2018-08-09 NOTE — [facility] Suicide Risk Assessment (Signed)
[facility] INPATIENT:  Family/Significant Other Suicide Prevention Education  Suicide Prevention Education:  Patient Refusal for Family/Significant Other Suicide Prevention Education: The patient Derrick Cooper has refused to provide written consent for family/significant other to be provided Family/Significant Other Suicide Prevention Education during admission and/or prior to discharge.  Physician notified.  Darreld Mclean 08/09/2018, 1:46 PM
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
## 2018-08-09 NOTE — Plan of Care (Signed)
  Problem: Safety: Goal: Periods of time without injury will increase Outcome: Progressing   Problem: Health Behavior/Discharge Planning: Goal: Compliance with treatment plan for underlying cause of condition will improve Outcome: Progressing  DAR NOTE: Patient presents with flat affect and depressed mood.  Denies suicidal thoughts, pain, auditory and visual hallucinations.  Described energy level as hyper and concentration as poor.  Rates depression at 7, hopelessness at 6, and anxiety at 5.  Maintained on routine safety checks.  Medications given as prescribed.  Support and encouragement offered as needed.  Attended group and participated.  States goal for today is "think positive."  Patient is visible in milieu with minimal interaction.  Offered no complaint.  Patient is safe on and off the unit.
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
## 2018-08-09 NOTE — Progress Notes (Signed)
Recreation Therapy Notes  Date: 1.9.20 Time: 1000 Location:  [address] Dayroom  Group Topic: Communication, Team Building, Problem Solving  Goal Area(s) Addresses:  Patient will effectively work with peer towards shared goal.  Patient will identify skills used to make activity successful.  Patient will identify how skills used during activity can be used to reach post d/c goals.   Behavioral Response: Engaged  Intervention: STEM Activity  Activity: Stage manager. In teams patients were given 12 plastic drinking straws and Cooper length of masking tape. Using the materials provided patients were asked to build Cooper landing pad to catch Cooper golf ball dropped from approximately 6 feet in the air.   Education: Pharmacist, community, Discharge Planning   Education Outcome: Acknowledges education/In group clarification offered/Needs additional education.   Clinical Observations/Feedback: Pt was active and worked well with peers to develop Cooper concept for their landing pad.  Pt was social and appropriate throughout activity.  Pt expressed the group had to use "distance and communication" to complete.  Pt also talked about how his family has turned their backs on him and he is now living on the street.     Derrick Cooper,  LRT/CTRS      Derrick Cooper 08/09/2018 12:02 PM

## 2018-08-09 NOTE — Progress Notes (Signed)
NUTRITION ASSESSMENT  Pt identified as at risk on the Malnutrition Screen Tool  INTERVENTION: - Continue to encourage PO intakes.   NUTRITION DIAGNOSIS: Unintentional weight loss related to sub-optimal intake as evidenced by pt report.   Goal: Pt to meet >/= 90% of their estimated nutrition needs.  Monitor:  PO intake  Assessment:  Patient admitted for SI, psychosis, and depression. Notes indicate that patient is interested in going to long-term treatment facility at the time of d/c. Per notes, patient had reported hallucinations PTA.  Per chart review, current weight is 146 lb and weight on 11/26 was 149 lb. This indicates 3 lb weight loss (2% body weight) in 1.5 months; not significant for time frame.   58 y.o. male  Height: Ht Readings from Last 1 Encounters:  08/09/18 6\' 2"  (1.88 m)    Weight: Wt Readings from Last 1 Encounters:  08/09/18 66.2 kg    Weight Hx: Wt Readings from Last 10 Encounters:  08/09/18 66.2 kg  07/11/18 65.3 kg  06/26/18 67.6 kg  11/13/17 68.5 kg  10/26/17 67.6 kg  09/27/17 68 kg    BMI:  Body mass index is 18.75 kg/m. Pt meets criteria for normal weight/borderline underweight based on current BMI.  Estimated Nutritional Needs: Kcal: 25-30 kcal/kg Protein: > 1 gram protein/kg Fluid: 1 ml/kcal  Diet Order:  Diet Order            Diet regular Room service appropriate? Yes; Fluid consistency: Thin  Diet effective now             Pt is also offered choice of unit snacks mid-morning and mid-afternoon.  Pt is eating as desired.   Lab results and medications reviewed.     Trenton Gammon, MS, RD, LDN, [facility] Inpatient Clinical Dietitian Pager # [phone] After hours/weekend pager # [phone]

## 2018-08-10 LAB — GLUCOSE, CAPILLARY
Glucose-Capillary: 117 mg/dL — ABNORMAL HIGH (ref 70–99)
Glucose-Capillary: 45 mg/dL — ABNORMAL LOW (ref 70–99)

## 2018-08-10 NOTE — Progress Notes (Signed)
Patient attended spiritual care group with chaplain   Group Description  Group focused on topic of "hope"   Patients engaged in facilitated dialog and visual explorer activity.  Worked to describe concept of hope in their lives and name experiences of hope.    Group facilitation focused on spiritual care and drew on Adlerian and elements of ACT  Pt participation:  Derrick Cooper was present throughout group.  Alert and oriented, he engaged appropriately.  Described hope in "staying on my medication"  Expressed insight into his process when he is not able to maintain medication.

## 2018-08-10 NOTE — Progress Notes (Signed)
Patient had been lying in bed asleep at beginning of shift. He later got up and spoke with writer reporting that he had a nightmare and it woke him up. Writer suggested that he take his nicotine patch off when he sleeps tonight. He went to the dayroom and ate his snacks and shortly requested his night medications. Medications given with gatorade for hydration. Safety maintained on unit with 15 min checks.

## 2018-08-10 NOTE — Plan of Care (Signed)
  Problem: Safety: Goal: Periods of time without injury will increase Outcome: Progressing   Problem: Activity: Goal: Interest or engagement in activities will improve Outcome: Progressing  DAR NOTE: Patient presents with flat affect and depressed mood.  Denies suicidal thoughts, pain, auditory and visual hallucinations.  Rates depression at 0, hopelessness at 0, and anxiety at 3.  Maintained on routine safety checks.  Medications given as prescribed .  Refused Lantus  insulin after education and encouragement.  MD made aware.  Support and encouragement offered as needed.  Attended group and participated.  States goal for today is "go home."  Patient is visible in milieu with minimal interaction with staff and peers.  Offered no complaint.  Patient is safe on and off the unit.

## 2018-08-10 NOTE — Tx Team (Signed)
Interdisciplinary Treatment and Diagnostic Plan Update  08/10/2018 Time of Session: 9:00am Derrick Cooper MRN: [id number]  Principal Diagnosis: <principal problem not specified>  Secondary Diagnoses: Active Problems:   MDD (major depressive disorder), recurrent episode, severe (HCC)   Schizoaffective disorder, bipolar type ([place])   Current Medications:  Current Facility-Administered Medications  Medication Dose Route Frequency Provider Last Rate Last Dose  . acetaminophen (TYLENOL) tablet 650 mg  650 mg Oral Q6H PRN Laverle Hobby, PA-C      . alum & mag hydroxide-simeth (MAALOX/MYLANTA) 200-200-20 MG/5ML suspension 30 mL  30 mL Oral Q4H PRN Patriciaann Clan E, PA-C      . ARIPiprazole ER (ABILIFY MAINTENA) injection 400 mg  400 mg Intramuscular Q28 days Johnn Hai, MD   400 mg at 08/09/18 1308  . benztropine (COGENTIN) tablet 0.5 mg  0.5 mg Oral BID Johnn Hai, MD   0.5 mg at 08/10/18 0809  . hydrOXYzine (ATARAX/VISTARIL) tablet 50 mg  50 mg Oral Q6H PRN Patriciaann Clan E, PA-C      . insulin aspart protamine- aspart (NOVOLOG MIX 70/30) injection 15 Units  15 Units Subcutaneous TID WC Johnn Hai, MD   15 Units at 08/09/18 1731  . insulin glargine (LANTUS) injection 30 Units  30 Units Subcutaneous BID Johnn Hai, MD   30 Units at 08/09/18 1728  . magnesium hydroxide (MILK OF MAGNESIA) suspension 30 mL  30 mL Oral Daily PRN Laverle Hobby, PA-C      . metFORMIN (GLUCOPHAGE) tablet 500 mg  500 mg Oral Q breakfast Johnn Hai, MD   500 mg at 08/10/18 0809  . metoprolol succinate (TOPROL-XL) 24 hr tablet 50 mg  50 mg Oral Daily Johnn Hai, MD   50 mg at 08/10/18 0810  . nicotine (NICODERM CQ - dosed in mg/24 hours) patch 21 mg  21 mg Transdermal Daily Johnn Hai, MD   21 mg at 08/10/18 0809  . perphenazine (TRILAFON) tablet 8 mg  8 mg Oral TID Johnn Hai, MD   8 mg at 08/10/18 0809  . temazepam (RESTORIL) capsule 30 mg  30 mg Oral QHS Johnn Hai, MD      . traZODone (DESYREL)  tablet 50 mg  50 mg Oral QHS,MR X 1 Simon, Spencer E, PA-C       PTA Medications: Medications Prior to Admission  Medication Sig Dispense Refill Last Dose  . ACCU-CHEK AVIVA PLUS test strip 1 each by Other route 3 (three) times daily. As directed: For diabetes management 100 each 1   . ACCU-CHEK SOFTCLIX LANCETS lancets 1 each by Other route 3 (three) times daily. As directed: For diabetes management 1 each 0   . ARIPiprazole ER (ABILIFY MAINTENA) 400 MG SRER injection Inject 2 mLs (400 mg total) into the muscle every 14 (fourteen) days. (Due on 07-31-18): For mood control 2 mL 0 Past Month at Unknown time  . B-D UF III MINI PEN NEEDLES 31G X 5 MM MISC USE AS DIRECTED QID: For diabetes management 1 each 0   . benztropine (COGENTIN) 0.5 MG tablet Take 1 tablet (0.5 mg total) by mouth 2 (two) times daily. For prevention of drug induced tremors (Patient not taking: Reported on 08/08/2018) 60 tablet 0 Not Taking at Unknown time  . bictegravir-emtricitabine-tenofovir AF (BIKTARVY) 50-200-25 MG TABS tablet Take 1 tablet by mouth daily. For HIV infection 30 tablet  Past Week at Unknown time  . Blood Glucose Monitoring Suppl (ACCU-CHEK AVIVA PLUS) w/Device KIT Apply 1 Device topically 3 (  three) times daily. As directed: Diabetes management  0   . cephALEXin (KEFLEX) 500 MG capsule Take 1 capsule (500 mg total) by mouth 2 (two) times daily. For infection (Patient not taking: Reported on 08/08/2018)   Not Taking at Unknown time  . Glecaprevir-Pibrentasvir (MAVYRET) 100-40 MG TABS Take 3 tablets by mouth daily. For Hep C infection (Patient not taking: Reported on 08/08/2018) 1 tablet 0 Not Taking at Unknown time  . HUMALOG KWIKPEN 100 UNIT/ML KwikPen Inject 0.15 mLs (15 Units total) into the skin 3 (three) times daily. Before meals: For diabetes management 15 mL 0 Past Week at Unknown time  . hydrOXYzine (ATARAX/VISTARIL) 25 MG tablet Take 1 tablet (25 mg total) by mouth 3 (three) times daily as needed for anxiety. 60  tablet 0 Past Month at Unknown time  . insulin glargine (LANTUS) 100 UNIT/ML injection Inject 0.3 mLs (30 Units total) into the skin 2 (two) times daily. For diabetes management (Patient taking differently: Inject 60 Units into the skin 2 (two) times daily. For diabetes management) 10 mL 11 Past Week at Unknown time  . Insulin Pen Needle (PEN NEEDLES 3/16") 31G X 5 MM MISC 1 mL by Does not apply route QID. For diabetes management 1 each 0   . lisinopril (PRINIVIL,ZESTRIL) 5 MG tablet Take 1 tablet (5 mg total) by mouth daily. For high blood pressure (Patient not taking: Reported on 08/08/2018) 1 tablet 0 Not Taking at Unknown time  . metFORMIN (GLUCOPHAGE) 500 MG tablet Take 1 tablet (500 mg total) by mouth daily with breakfast. For diabetes managment (Patient not taking: Reported on 08/08/2018)   Not Taking at Unknown time  . metoprolol succinate (TOPROL-XL) 100 MG 24 hr tablet Take 1 tablet (100 mg total) by mouth daily. For high blood pressureTake with or immediately following a meal. (Patient not taking: Reported on 08/08/2018)   Not Taking at Unknown time  . nicotine (NICODERM CQ - DOSED IN MG/24 HOURS) 21 mg/24hr patch Place 1 patch (21 mg total) onto the skin daily. (May buy from over the counter): For smoking cessation (Patient not taking: Reported on 08/08/2018) 28 patch 0 Not Taking at Unknown time  . pantoprazole (PROTONIX) 40 MG tablet Take 1 tablet (40 mg total) by mouth daily. For acid reflux (Patient not taking: Reported on 08/08/2018) 15 tablet 0 Not Taking at Unknown time  . perphenazine (TRILAFON) 2 MG tablet Take 3 tablets (6 mg total) by mouth 3 (three) times daily. For mood control (Patient not taking: Reported on 08/08/2018) 270 tablet 0 Not Taking at Unknown time  . rosuvastatin (CRESTOR) 5 MG tablet Take 1 tablet (5 mg total) by mouth daily. For high cholesterol (Patient not taking: Reported on 08/08/2018) 30 tablet 1 Not Taking at Unknown time  . temazepam (RESTORIL) 30 MG capsule Take 1 capsule  (30 mg total) by mouth at bedtime. For sleep (Patient not taking: Reported on 08/08/2018) 15 capsule 0 Not Taking at Unknown time  . traZODone (DESYREL) 50 MG tablet Take 1 tablet (50 mg total) by mouth at bedtime as needed for sleep. (Patient not taking: Reported on 08/08/2018) 30 tablet 0 Not Taking at Unknown time    Patient Stressors: Health problems Medication change or noncompliance Substance abuse  Patient Strengths: General fund of knowledge Motivation for treatment/growth  Treatment Modalities: Medication Management, Group therapy, Case management,  1 to 1 session with clinician, Psychoeducation, Recreational therapy.   Physician Treatment Plan for Primary Diagnosis: <principal problem not specified> Long Term Goal(s):  Improvement in symptoms so as ready for discharge Improvement in symptoms so as ready for discharge   Short Term Goals: Ability to disclose and discuss suicidal ideas Ability to identify and develop effective coping behaviors will improve Ability to demonstrate self-control will improve Compliance with prescribed medications will improve  Medication Management: Evaluate patient's response, side effects, and tolerance of medication regimen.  Therapeutic Interventions: 1 to 1 sessions, Unit Group sessions and Medication administration.  Evaluation of Outcomes: Progressing  Physician Treatment Plan for Secondary Diagnosis: Active Problems:   MDD (major depressive disorder), recurrent episode, severe (HCC)   Schizoaffective disorder, bipolar type ([place])  Long Term Goal(s): Improvement in symptoms so as ready for discharge Improvement in symptoms so as ready for discharge   Short Term Goals: Ability to disclose and discuss suicidal ideas Ability to identify and develop effective coping behaviors will improve Ability to demonstrate self-control will improve Compliance with prescribed medications will improve     Medication Management: Evaluate patient's  response, side effects, and tolerance of medication regimen.  Therapeutic Interventions: 1 to 1 sessions, Unit Group sessions and Medication administration.  Evaluation of Outcomes: Progressing   RN Treatment Plan for Primary Diagnosis: <principal problem not specified> Long Term Goal(s): Knowledge of disease and therapeutic regimen to maintain health will improve  Short Term Goals: Ability to verbalize frustration and anger appropriately will improve, Ability to demonstrate self-control, Ability to verbalize feelings will improve, Ability to disclose and discuss suicidal ideas and Compliance with prescribed medications will improve  Medication Management: RN will administer medications as ordered by provider, will assess and evaluate patient's response and provide education to patient for prescribed medication. RN will report any adverse and/or side effects to prescribing provider.  Therapeutic Interventions: 1 on 1 counseling sessions, Psychoeducation, Medication administration, Evaluate responses to treatment, Monitor vital signs and CBGs as ordered, Perform/monitor CIWA, COWS, AIMS and Fall Risk screenings as ordered, Perform wound care treatments as ordered.  Evaluation of Outcomes: Progressing   LCSW Treatment Plan for Primary Diagnosis: <principal problem not specified> Long Term Goal(s): Safe transition to appropriate next level of care at discharge, Engage patient in therapeutic group addressing interpersonal concerns.  Short Term Goals: Engage patient in aftercare planning with referrals and resources, Increase social support, Increase emotional regulation, Identify triggers associated with mental health/substance abuse issues and Increase skills for wellness and recovery  Therapeutic Interventions: Assess for all discharge needs, 1 to 1 time with Social worker, Explore available resources and support systems, Assess for adequacy in community support network, Educate family and  significant other(s) on suicide prevention, Complete Psychosocial Assessment, Interpersonal group therapy.  Evaluation of Outcomes: Progressing   Progress in Treatment: Attending groups: Yes. Participating in groups: Yes. Taking medication as prescribed: Yes. Toleration medication: Yes. Family/Significant other contact made: No, will contact:  declines consents Patient understands diagnosis: Yes. Discussing patient identified problems/goals with staff: Yes. Medical problems stabilized or resolved: Yes. Denies suicidal/homicidal ideation: Yes. Issues/concerns per patient self-inventory: No.  New problem(s) identified: No, Describe:  none  New Short Term/Long Term Goal(s): medication management for mood stabilization; elimination of SI thoughts; development of comprehensive mental wellness/sobriety plan.  Patient Goals: "Discharge, I got my shot, I want to get back to gather my blankets."  Discharge Plan or Barriers: Patient following up with [place] [place] pamphlet, Mobile Crisis information, and AA/NA information provided to patient for additional community support and resources.   Reason for Continuation of Hospitalization: Anxiety Depression Medication stabilization  Estimated Length of Stay:  1-3 days  Attendees: Patient: 08/10/2018 11:18 AM  Physician:  08/10/2018 11:18 AM  Nursing:  08/10/2018 11:18 AM  RN Care Manager: 08/10/2018 11:18 AM  Social Worker: Stephanie Acre, [place] 08/10/2018 11:18 AM  Recreational Therapist:  08/10/2018 11:18 AM  Other:  08/10/2018 11:18 AM  Other:  08/10/2018 11:18 AM  Other: 08/10/2018 11:18 AM    Scribe for Treatment Team: Joellen Jersey, LCSWA 08/10/2018 11:18 AM

## 2018-08-10 NOTE — Progress Notes (Signed)
CBG 115. Pt in no distress. Pt states he rarely eats sugar and his blood sugar has been WNL at home. Pt states he does not use insulin at home and that the ordered insulin dosages are too much. Will continue to monitor.
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
## 2018-08-10 NOTE — Progress Notes (Signed)
CBG 45. Pt asymptomatic of hypoglycemia. Given sugars and carbs. Provider notified. No new orders. Will recheck Q15 till CBG 70 or above. Will continue to monitor.

## 2018-08-10 NOTE — Progress Notes (Signed)
Recreation Therapy Notes  Date: 1.10.19 Time: 1000 Location: 500 Hall Dayroom  Group Topic: Triggers  Goal Area(s) Addresses:  Patient will identify triggers.  Patient will verbalize benefit of avoiding triggers. Patient will verbalize ways to face triggers head on.  Behavioral Response: Engaged  Intervention: Worksheet  Activity: Triggers.  Patients were to identify their biggest triggers.  Patients were to also identify how they could avoid or reduce exposure to triggers and how they could face triggers head on when they couldn't be avoided.  Education: Triggers, Discharge Planning  Education Outcome: Acknowledges understanding/In group clarification offered/Needs additional education.   Clinical Observations/Feedback: Pt stated his triggers were "alcohol, weed, money and not taking medications".  Pt reduce/avoids triggers by "not hanging with addicts, taking medications and taking medications".  Pt faces triggers head on by going to NA/AA meetings, not using, look for long term housing and finding another payee.      Caroll Rancher, LRT/CTRS         Caroll Rancher A 08/10/2018 11:15 AM

## 2018-08-10 NOTE — Progress Notes (Signed)
Adult Psychoeducational Group Note  Date:  08/10/2018 Time:  8:49 PM  Group Topic/Focus:  Wrap-Up Group:   The focus of this group is to help patients review their daily goal of treatment and discuss progress on daily workbooks.  Participation Level:  Active  Participation Quality:  Appropriate  Affect:  Appropriate  Cognitive:  Appropriate  Insight: Appropriate  Engagement in Group:  Engaged  Modes of Intervention:  Discussion  Additional Comments: The patient expressed that he rates today a 8.The patient also said that he enjoyed groups. Derrick Cooper 08/10/2018, 8:49 PM

## 2018-08-11 LAB — GLUCOSE, CAPILLARY
Glucose-Capillary: 152 mg/dL — ABNORMAL HIGH (ref 70–99)
Glucose-Capillary: 124 mg/dL — ABNORMAL HIGH (ref 70–99)

## 2018-08-11 MED ORDER — TRAZODONE HCL 50 MG PO TABS: 50.0000 mg | ORAL_TABLET | Freq: Every evening | ORAL | 0 refills | Status: DC | PRN

## 2018-08-11 MED ORDER — NICOTINE 21 MG/24HR TD PT24: 21.0000 mg | MEDICATED_PATCH | Freq: Every day | TRANSDERMAL | 0 refills | Status: DC

## 2018-08-11 MED ORDER — INSULIN GLARGINE 100 UNIT/ML ~~LOC~~ SOLN: 30.0000 [IU] | Freq: Two times a day (BID) | SUBCUTANEOUS | 0 refills | Status: DC

## 2018-08-11 MED ORDER — INSULIN ASPART PROT & ASPART (70-30 MIX) 100 UNIT/ML ~~LOC~~ SUSP: 15.0000 [IU] | Freq: Three times a day (TID) | SUBCUTANEOUS | 0 refills | Status: DC

## 2018-08-11 MED ORDER — HYDROXYZINE HCL 50 MG PO TABS: 50.0000 mg | ORAL_TABLET | Freq: Four times a day (QID) | ORAL | 0 refills | Status: DC | PRN

## 2018-08-11 MED ORDER — METFORMIN HCL 500 MG PO TABS: 500.0000 mg | ORAL_TABLET | Freq: Every day | ORAL | 0 refills | Status: DC

## 2018-08-11 MED ORDER — BENZTROPINE MESYLATE 0.5 MG PO TABS: 0.5000 mg | ORAL_TABLET | Freq: Two times a day (BID) | ORAL | 0 refills | Status: DC

## 2018-08-11 MED ORDER — PERPHENAZINE 8 MG PO TABS: 8.0000 mg | ORAL_TABLET | Freq: Three times a day (TID) | ORAL | 0 refills | Status: DC

## 2018-08-11 MED ORDER — METOPROLOL SUCCINATE ER 50 MG PO TB24: 50.0000 mg | ORAL_TABLET | Freq: Every day | ORAL | 0 refills | Status: DC

## 2018-08-11 NOTE — Progress Notes (Signed)
Patient ID: ZAE STONES, male   DOB: [birth year], 58 y.o.   MRN: [id number]  D: Pt alert and oriented on the unit.   A: Education, support, and encouragement provided. Discharge summary, medications and follow up appointments reviewed with pt. Suicide prevention resources provided, including "My 3 App." Pt's belongings in lockers #1 & #2 returned and belongings sheet signed.  R: Pt denies SI/HI, A/VH, pain, or any concerns at this time. Pt ambulatory on and off unit. Pt discharged to lobby.

## 2018-08-11 NOTE — BHH Suicide Risk Assessment (Addendum)
[facility] Discharge Suicide Risk Assessment   Principal Problem:  Schizoaffective Disorder  Discharge Diagnoses: Active Problems:   MDD (major depressive disorder), recurrent episode, severe (HCC)   Schizoaffective disorder, bipolar type (HCC)   Total Time spent with patient: 30 minutes  Musculoskeletal: Strength & Muscle Tone: within normal limits Gait & Station: normal Patient leans: N/A  Psychiatric Specialty Exam: ROS no headache, no chest pain, no shortness of breath, no vomiting, no dizziness or lightheadedness . 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 rate 16, height 6\' 2"  (1.88 m), weight 66.2 kg.Body mass index is 18.75 kg/m.  General Appearance: Fairly Groomed  Patent attorney::  Good  Speech:  Normal Rate409  Volume:  Normal  Mood:  reports mood is "OK", denies feeling depressed   Affect:  blunted, calm  Thought Process:  Linear and Descriptions of Associations: Intact  Orientation:  Full (Time, Place, and Person)  Thought Content:  Denies hallucinations and does not appear to be internally preoccupied, no delusions are expressed   Suicidal Thoughts:  No denies suicidal or self injurious ideations, denies homicidal or violent ideations  Homicidal Thoughts:  No denies any homicidal or violent ideations, and specifically denies any violent or homicidal ideations towards sister or any intentions of violence or any current plan or intention of any arson.  Memory:  recent and remote grossly intact   Judgement:  Other:  improving   Insight:  fair, improving   Psychomotor Activity:  Normal- no current psychomotor agitation  Concentration:  Good  Recall:  Good  Fund of Knowledge:Good  Language: Good  Akathisia:  Negative  Handed:  Right  AIMS (if indicated):     Assets:  Communication Skills Desire for Improvement Resilience  Sleep:  Number of Hours: 6.25   Cognition: WNL  ADL's:  Intact   Mental Status Per Nursing Assessment::   On Admission:  NA  Demographic Factors:  58 year old male, single, has one adult daughter, he is currently homeless, on SSI  Loss Factors: Homelessness , reports he had missed his prior long acting antipsychotic prior to admission  Historical Factors: Reports history of prior psychiatric admissions, reports prior history of Schizophrenia diagnosis. HIV (+)   Risk Reduction Factors:   Positive coping skills or problem solving skills  Continued Clinical Symptoms:  At this time patient is alert, attentive, calm, polite on approach, describes feeling better, mood is described as improved and states " I feel good today", affect blunted, but tends to improve somewhat during session, no irritability or agitation, no thought disorder noted, no hallucinations,not internally preoccupied, no current delusions expressed .  Denies medication side effects. Behavior on unit in good control. Calm, polite on approach.   Cognitive Features That Contribute To Risk:  No gross cognitive deficits noted upon discharge. Is alert , attentive, and oriented x 3   Suicide Risk:  No gross cognitive deficits noted upon discharge. Is alert , attentive, and oriented x 3   Follow-up Information    [facility]. Go on 08/16/2018.   Why:  Please attend your hospital discharge appointment 08/16/2018 at 8:30am Contact information: [address] [place] [address] [phone]           Plan Of Care/Follow-up recommendations:  Activity:  as tolerated Diet:  Diabetic Diet Tests:  NA Other:  See below Patient reports readiness for discharge , leaving unit in good  spirits . Chart review and as discussed with team, discharge planning was in progress and Dr. Jeannine KittenFarah had recommended discharge today. No current grounds for involuntary commitment.  Plans to go to shelter .  Follow up as above . He has an established PCP , Dr[facility] Shawnee [facility]Levy , [facility], for diabetic management. Follows [facility] with [facility] for HIV management, states he takes an antiretroviral medication regularly. Craige CottaFernando A Cobos, MD 08/11/2018, 10:29 AM

## 2018-08-11 NOTE — BHH Group Notes (Signed)
  BHH/BMU LCSW Group Therapy Note  Date/Time:  08/11/2018 11:15AM-12:00PM  Type of Therapy and Topic:  Group Therapy:  Feelings About Hospitalization  Participation Level:  Did Not Attend   Description of Group This process group involved patients discussing their feelings related to being hospitalized, as well as the benefits they see to being in the hospital.  These feelings and benefits were itemized.  The group then brainstormed specific ways in which they could seek those same benefits when they discharge and return home.  Therapeutic Goals 1. Patient will identify and describe positive and negative feelings related to hospitalization 2. Patient will verbalize benefits of hospitalization to themselves personally 3. Patients will brainstorm together ways they can obtain similar benefits in the outpatient setting, identify barriers to wellness and possible solutions  Summary of Patient Progress:  N/A - already discharged  Therapeutic Modalities Cognitive Behavioral Therapy Motivational Interviewing    Ambrose MantleMareida Grossman-Orr, LCSW 08/11/2018, 8:54 AM
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
## 2018-08-11 NOTE — Progress Notes (Signed)
  [facility] Adult Case Management Discharge Plan :  Will you be returning to the same living situation after discharge:  Yes,  home At discharge, do you have transportation home?: Yes,  bus pass provided Do you have the ability to pay for your medications: Yes,  mental health  Release of information consent forms completed and submitted to medical records by CSW.   Patient to Follow up at: Follow-up Information    [facility]. Go on 08/16/2018.   Why:  Please attend your hospital discharge appointment 08/16/2018 at 8:30am Contact information: [address] [place] [address] [phone]           Next level of care provider has access to [facility] Unit Link:no  Safety Planning and Suicide Prevention discussed: Yes,  SPE completed with pt; pt declined to consent to collateral contact. SPI Pamphlet and mobile crisis information provided.      Has patient been referred to the Quitline?: Patient refused referral  Patient has been referred for addiction treatment: Yes  Rona Ravens, LCSW 08/11/2018, 10:19 AM

## 2018-08-13 ENCOUNTER — Encounter: Payer: Self-pay | Admitting: Pediatric Intensive Care
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
## 2018-08-14 LAB — GLUCOSE, POCT (MANUAL RESULT ENTRY): POC Glucose: 217 mg/dl — AB (ref 70–99)

## 2018-08-15 ENCOUNTER — Encounter: Payer: Self-pay | Admitting: Pediatric Intensive Care

## 2018-08-15 DIAGNOSIS — E1169 Type 2 diabetes mellitus with other specified complication: Secondary | ICD-10-CM

## 2018-08-15 NOTE — Congregational Nurse Program (Signed)
  Dept: [phone]   Congregational Nurse Program Note  Date of Encounter: 08/15/2018  Past Medical History: Past Medical History:  Diagnosis Date  . Diabetes mellitus without complication (HCC)   . Hepatitis C   . HIV infection (HCC)   . Schizophrenia (HCC)   . Substance abuse [facility])     Encounter Details: Clinic follow up. Client now has a bed at [facility]. Client had breakfast this morning but did not take insulin. Would like BG checked. He took his Biktarvy this morning. CN discussed prescribed insulin dosages and need to re-start insulin. CN advised client of clinic hours at [facility]. CN wrote reminder for client regarding his follow up appointment at [facility] on 1/16. He will either walk or get bus passes. Return to clinic tomorrow for BG/BP check. Client agrees to plan. Shann Medal RN BSN CNP [phone]

## 2018-08-20 ENCOUNTER — Encounter: Payer: Self-pay | Admitting: Pediatric Intensive Care

## 2018-08-20 DIAGNOSIS — E119 Type 2 diabetes mellitus without complications: Secondary | ICD-10-CM

## 2018-08-20 LAB — GLUCOSE, POCT (MANUAL RESULT ENTRY): POC Glucose: 190 mg/dl — AB (ref 70–99)

## 2018-08-21 ENCOUNTER — Telehealth: Payer: Self-pay | Admitting: Pediatric Intensive Care

## 2018-08-21 NOTE — Telephone Encounter (Signed)
Left message for [facility] referral coordinator regarding follow up appointment.
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
## 2018-08-22 ENCOUNTER — Encounter: Payer: Self-pay | Admitting: Pediatric Intensive Care

## 2018-08-22 DIAGNOSIS — E119 Type 2 diabetes mellitus without complications: Secondary | ICD-10-CM

## 2018-08-22 LAB — GLUCOSE, POCT (MANUAL RESULT ENTRY): POC Glucose: 43 mg/dl — AB (ref 70–99)

## 2018-08-22 NOTE — Congregational Nurse Program (Signed)
  Dept: [phone]   Congregational Nurse Program Note  Date of Encounter: 08/22/2018  Past Medical History: Past Medical History:  Diagnosis Date  . Diabetes mellitus without complication (HCC)   . Hepatitis C   . HIV infection (HCC)   . Schizophrenia (HCC)   . Substance abuse (HCC)     Encounter Details: Follow up from PCP appointment yesterday. Reviewed medication changes with client. Gave bus passes to pick up medication at [facility]. Advised client to get "disability sticker" to remain in lobby at [place] during inclement weather. Client will walk in at [facility] tomorrow. Shann Medal RN BSN CNP [phone]

## 2018-08-23 ENCOUNTER — Encounter: Payer: Self-pay | Admitting: Pediatric Intensive Care

## 2018-08-27 ENCOUNTER — Ambulatory Visit: Payer: Self-pay | Admitting: *Deleted

## 2018-08-27 DIAGNOSIS — B20 Human immunodeficiency virus [HIV] disease: Secondary | ICD-10-CM

## 2018-08-27 NOTE — Congregational Nurse Program (Signed)
  Dept: [phone]   Congregational Nurse Program Note  Date of Encounter: 08/23/2018  Past Medical History: Past Medical History:  Diagnosis Date  . Diabetes mellitus without complication (HCC)   . Hepatitis C   . HIV infection (HCC)   . Schizophrenia (HCC)   . Substance abuse (HCC)     Encounter Details: Client states that the shelter is "getting to him" but that he slept better last night after taking trazodone. Client has insight to emotional triggers and knows how to avoid them. His affect is somewhat flat today as compared with other visits. CN gave letter to carry to staff at [facility]. Client to follow up in [place] clinic tomorrow for BP/BG check. Shann Medal RN BSN CNP [phone]

## 2018-09-03 ENCOUNTER — Encounter: Payer: Self-pay | Admitting: Pediatric Intensive Care

## 2018-09-03 DIAGNOSIS — E1169 Type 2 diabetes mellitus with other specified complication: Secondary | ICD-10-CM

## 2018-09-03 LAB — GLUCOSE, POCT (MANUAL RESULT ENTRY): POC Glucose: 160 mg/dl — AB (ref 70–99)

## 2018-09-05 ENCOUNTER — Encounter: Payer: Self-pay | Admitting: Pediatric Intensive Care

## 2018-09-05 DIAGNOSIS — E1169 Type 2 diabetes mellitus with other specified complication: Secondary | ICD-10-CM

## 2018-09-05 LAB — GLUCOSE, POCT (MANUAL RESULT ENTRY): POC Glucose: 162 mg/dl — AB (ref 70–99)

## 2018-09-10 ENCOUNTER — Encounter: Payer: Self-pay | Admitting: Pediatric Intensive Care

## 2018-09-10 DIAGNOSIS — E1169 Type 2 diabetes mellitus with other specified complication: Secondary | ICD-10-CM

## 2018-09-10 LAB — GLUCOSE, POCT (MANUAL RESULT ENTRY): POC Glucose: 158 mg/dl — AB (ref 70–99)

## 2018-09-11 ENCOUNTER — Other Ambulatory Visit: Payer: Self-pay | Admitting: Pharmacist

## 2018-09-11 DIAGNOSIS — B20 Human immunodeficiency virus [HIV] disease: Secondary | ICD-10-CM

## 2018-09-11 MED ORDER — BICTEGRAVIR-EMTRICITAB-TENOFOV 50-200-25 MG PO TABS: 1.0000 | ORAL_TABLET | Freq: Every day | ORAL | 5 refills | Status: DC

## 2018-09-11 MED FILL — BIKTARVY 50-200-25 MG TABS: 50-200-25 | 30 days supply | Qty: 30 | Fill #0

## 2018-09-11 NOTE — Progress Notes (Signed)
Transferring Biktarvy to [employer] so that Molli Posey can coordinate getting patient refills since he is homeless.

## 2018-09-12 ENCOUNTER — Encounter: Payer: Self-pay | Admitting: Pediatric Intensive Care

## 2018-09-12 DIAGNOSIS — E1169 Type 2 diabetes mellitus with other specified complication: Secondary | ICD-10-CM

## 2018-09-12 LAB — GLUCOSE, POCT (MANUAL RESULT ENTRY): POC Glucose: 133 mg/dl — AB (ref 70–99)

## 2018-09-17 ENCOUNTER — Encounter: Payer: Self-pay | Admitting: Pediatric Intensive Care

## 2018-09-17 DIAGNOSIS — E1169 Type 2 diabetes mellitus with other specified complication: Secondary | ICD-10-CM

## 2018-09-17 LAB — GLUCOSE, POCT (MANUAL RESULT ENTRY): POC Glucose: 97 mg/dl (ref 70–99)

## 2018-09-20 ENCOUNTER — Encounter: Payer: Self-pay | Admitting: Pediatric Intensive Care

## 2018-09-24 ENCOUNTER — Encounter: Payer: Self-pay | Admitting: Pediatric Intensive Care

## 2018-09-24 ENCOUNTER — Telehealth: Payer: Self-pay | Admitting: Pediatric Intensive Care

## 2018-09-24 DIAGNOSIS — E119 Type 2 diabetes mellitus without complications: Secondary | ICD-10-CM

## 2018-09-24 LAB — GLUCOSE, POCT (MANUAL RESULT ENTRY): POC Glucose: 93 mg/dl (ref 70–99)

## 2018-09-24 NOTE — Telephone Encounter (Signed)
Call from PCP Inez Pilgrim- CN had left message regarding client blood glucose. Provider advised decreasing Lantus to 50 units qAM and follow BGs for one week. CN left message for client with [facility] front desk to decrease insulin and follow up with CN on Monday. Shann Medal RN BSN CNP [phone]

## 2018-09-24 NOTE — Telephone Encounter (Signed)
Left message for Cleophus Molt PA-C regarding client blood glucose.

## 2018-10-01 ENCOUNTER — Encounter: Payer: Self-pay | Admitting: Pediatric Intensive Care

## 2018-10-01 DIAGNOSIS — E119 Type 2 diabetes mellitus without complications: Secondary | ICD-10-CM

## 2018-10-01 LAB — GLUCOSE, POCT (MANUAL RESULT ENTRY): POC Glucose: 158 mg/dl — AB (ref 70–99)

## 2018-10-02 ENCOUNTER — Telehealth: Payer: Self-pay | Admitting: Pharmacy Technician

## 2018-10-02 ENCOUNTER — Telehealth: Payer: Self-pay | Admitting: Pediatric Intensive Care

## 2018-10-02 NOTE — Congregational Nurse Program (Signed)
  Dept: [phone]   Congregational Nurse Program Note  Date of Encounter: 09/10/2018  Past Medical History: Past Medical History:  Diagnosis Date  . Diabetes mellitus without complication (HCC)   . Hepatitis C   . HIV infection (HCC)   . Schizophrenia (HCC)   . Substance abuse (HCC)     Encounter Details:BG/BP check. Client states he made it to [facility] provider for his Abilify injection.  Shann Medal RN BSN CNP [phone]

## 2018-10-02 NOTE — Telephone Encounter (Signed)
Call to PCP regarding blood glucose trend. Left message with provider's nurse. Shann Medal RN BSN CNP [phone]

## 2018-10-02 NOTE — Congregational Nurse Program (Signed)
  Dept: [phone]   Congregational Nurse Program Note  Date of Encounter: 09/03/2018  Past Medical History: Past Medical History:  Diagnosis Date  . Diabetes mellitus without complication (HCC)   . Hepatitis C   . HIV infection (HCC)   . Schizophrenia (HCC)   . Substance abuse (HCC)     Encounter Details:BP/BG check. Client states he had smoked a "blunt" before appointment this morning. CN asked client not to smoke anything prior to BP check as smoking may elevate reading. Client states he is "hanging" with more people at the shelter who use drugs and that he sometimes forgets to take his medications due to intoxication. Client states he lost his phone. CN reminded client of progress he has made regarding ownership of his health. Client states he will check in with peer counselor today. Shann Medal RN BSN CNP [phone]

## 2018-10-02 NOTE — Congregational Nurse Program (Signed)
  Dept: [phone]   Congregational Nurse Program Note  Date of Encounter: 09/17/2018  Past Medical History: Past Medical History:  Diagnosis Date  . Diabetes mellitus without complication (HCC)   . Hepatitis C   . HIV infection (HCC)   . Schizophrenia (HCC)   . Substance abuse (HCC)     Encounter Details:BG/BP check. Client states he had a "better weekend" with less triggers to substance use and better response to triggers. States he plans to continue going to [facility] and that he doesn't feel that he needs an inpatient program. CN states that she supports client's decision. Shann Medal RN BSN CNP [phone]

## 2018-10-02 NOTE — Congregational Nurse Program (Signed)
  Dept: [phone]   Congregational Nurse Program Note  Date of Encounter: 09/05/2018  Past Medical History:BP/BG check. Shann Medal RN BSN CNP [phone] Past Medical History:  Diagnosis Date  . Diabetes mellitus without complication (HCC)   . Hepatitis C   . HIV infection (HCC)   . Schizophrenia (HCC)   . Substance abuse (HCC)     Encounter Details:

## 2018-10-02 NOTE — Congregational Nurse Program (Signed)
  Dept: [phone]   Congregational Nurse Program Note  Date of Encounter: 09/20/2018  Past Medical History: Past Medical History:  Diagnosis Date  . Diabetes mellitus without complication (HCC)   . Hepatitis C   . HIV infection (HCC)   . Schizophrenia (HCC)   . Substance abuse (HCC)     Encounter Details: BG/BP check. Client states taking medication consistently. He received his BIktarvy prescription. CN will call provider for advice regarding insulin dose. Shann Medal RN BSN CNP [phone]

## 2018-10-02 NOTE — Congregational Nurse Program (Signed)
  Dept: [phone]   Congregational Nurse Program Note  Date of Encounter: 09/12/2018  Past Medical History: BG check. CN verified with [facility] that client has no Biktarvy refills at present. Shann Medal RN BSN CNP  [phone] Past Medical History:  Diagnosis Date  . Diabetes mellitus without complication (HCC)   . Hepatitis C   . HIV infection (HCC)   . Schizophrenia (HCC)   . Substance abuse (HCC)     Encounter Details:

## 2018-10-02 NOTE — Congregational Nurse Program (Signed)
  Dept: [phone]   Congregational Nurse Program Note  Date of Encounter: 09/24/2018  Past Medical History: Past Medical History:  Diagnosis Date  . Diabetes mellitus without complication (HCC)   . Hepatitis C   . HIV infection (HCC)   . Schizophrenia (HCC)   . Substance abuse (HCC)     Encounter Details:BG/BP check. Client to check in at [facility] clinic for BG checks this week. Client states he took 50units Lantus insulin over the weekend as directed. Follow up in CN clinic next week. Shann Medal RN BSN CNP [phone]

## 2018-10-02 NOTE — Telephone Encounter (Signed)
[facility] Patient Advocate Encounter  Contacted this morning to refill his medication, Biktarvy. Refill will be processed 10/05/2018 to be picked up by Lind Covert, RN and delivered to the patient at the resource center/[facility]. [facility] is made aware of the process as well and will prepare the medication Friday morning for pickup.  Beulah Gandy, CPhT Specialty Pharmacy Patient [facility] for Infectious Disease Phone: [phone] Fax: [phone] 10/02/2018 10:56 AM

## 2018-10-03 ENCOUNTER — Encounter: Payer: Self-pay | Admitting: Pediatric Intensive Care

## 2018-10-03 DIAGNOSIS — E119 Type 2 diabetes mellitus without complications: Secondary | ICD-10-CM

## 2018-10-03 LAB — GLUCOSE, POCT (MANUAL RESULT ENTRY): POC Glucose: 147 mg/dl — AB (ref 70–99)

## 2018-10-05 ENCOUNTER — Ambulatory Visit: Payer: Self-pay | Admitting: *Deleted

## 2018-10-05 DIAGNOSIS — B20 Human immunodeficiency virus [HIV] disease: Secondary | ICD-10-CM

## 2018-10-05 DIAGNOSIS — Z5900 Homelessness unspecified: Secondary | ICD-10-CM

## 2018-10-05 MED FILL — BIKTARVY 50-200-25 MG TABS: 50-200-25 | 30 days supply | Qty: 30 | Fill #1

## 2018-10-17 ENCOUNTER — Encounter: Payer: Self-pay | Admitting: Pediatric Intensive Care

## 2018-10-17 DIAGNOSIS — E119 Type 2 diabetes mellitus without complications: Secondary | ICD-10-CM

## 2018-10-17 LAB — GLUCOSE, POCT (MANUAL RESULT ENTRY): POC Glucose: 142 mg/dl — AB (ref 70–99)

## 2018-10-18 NOTE — Congregational Nurse Program (Signed)
  Dept: [phone]   Congregational Nurse Program Note  Date of Encounter: 10/17/2018  Past Medical History: Past Medical History:  Diagnosis Date  . Diabetes mellitus without complication (HCC)   . Hepatitis C   . HIV infection (HCC)   . Schizophrenia (HCC)   . Substance abuse [facility])     Encounter Details: BG check Shann Medal RN BSN CNP [phone]

## 2018-10-29 MED FILL — BIKTARVY 50-200-25 MG TABS: 50-200-25 | 30 days supply | Qty: 30 | Fill #2

## 2018-11-01 ENCOUNTER — Other Ambulatory Visit: Payer: Self-pay

## 2018-11-01 ENCOUNTER — Ambulatory Visit: Payer: Self-pay | Admitting: *Deleted

## 2018-11-01 NOTE — Progress Notes (Signed)
[name] does not have a phone number or an alternate contact. He is due for a refill of his Biktarvy. Contacted [employer] and [place] is ready for pick up. I have been told that the patient is no longer living at the [employer] and has moved to a new address ([address] since I cannot confirm that the patient actually lives here the medications cannot be mailed.   Traveled to [employer] and picked the medications up for the patient. My goal is to make a minimal amount of visits within the community.  Ttraveled to the patient's possible address of [address] [place] and I was unable to get an answer. I traveled around the surrounding areas to see if the patient would be seen walking from the local stores. After waiting for an additional five minutes, I left the home and contacted the congregational nurse for the [facility] and [facility] leaving a message asking  to please contact me  if she gets in contact or sees Mr. Dasilva.

## 2018-11-09 NOTE — Congregational Nurse Program (Signed)
  Dept: [phone]   Congregational Nurse Program Note  Date of Encounter: 10/03/2018  Past Medical History: Past Medical History:  Diagnosis Date  . Diabetes mellitus without complication (HCC)   . Hepatitis C   . HIV infection (HCC)   . Schizophrenia (HCC)   . Substance abuse (HCC)     Encounter Details:BG/ BP check. Please get [facility] card! Shann Medal RN BSN CNP [phone]

## 2018-11-09 NOTE — Congregational Nurse Program (Signed)
  Dept: [phone]   Congregational Nurse Program Note  Date of Encounter: 10/01/2018  Past Medical History: Past Medical History:  Diagnosis Date  . Diabetes mellitus without complication (HCC)   . Hepatitis C   . HIV infection (HCC)   . Schizophrenia (HCC)   . Substance abuse (HCC)     Encounter Details:BG check. Client states he was unable to get his BG checked at [place] clinic last week.  Shann Medal RN BSN CNP [phone]

## 2018-11-22 ENCOUNTER — Encounter: Payer: Self-pay | Admitting: Pediatric Intensive Care

## 2018-11-22 NOTE — Congregational Nurse Program (Signed)
  Dept: [phone]   Congregational Nurse Program Note  Date of Encounter: 11/22/2018  Past Medical History: Past Medical History:  Diagnosis Date  . Diabetes mellitus without complication (HCC)   . Hepatitis C   . HIV infection (HCC)   . Schizophrenia (HCC)   . Substance abuse (HCC)     Encounter Details: CNP contacted client via Web designer at [employer]. Client instructed to go to [facility] to meet with [facility] peer support for transport to [facility] for monthly Abilify injection. Client received injection and is now housed in room #403 at [facility] as he has lost his housing. CN will follow up with client for BP and BG check. Staff email to [employer] with [facility]. Shann Medal RN BSN CNP [phone]

## 2018-11-23 ENCOUNTER — Encounter: Payer: Self-pay | Admitting: Pediatric Intensive Care

## 2018-11-23 DIAGNOSIS — E119 Type 2 diabetes mellitus without complications: Secondary | ICD-10-CM

## 2018-11-23 LAB — GLUCOSE, POCT (MANUAL RESULT ENTRY): POC Glucose: 130 mg/dl — AB (ref 70–99)

## 2018-12-02 NOTE — Progress Notes (Signed)
COVID Hotel Screening performed. Temperature, PHQ-9, and need for medical care and medications assessed. No additional needs assessed at this time.  Cameron Katayama RN MSN 

## 2018-12-07 NOTE — Progress Notes (Signed)
COVID Hotel Screening performed. Temperature, PHQ-9, and need for medical care and medications assessed. No additional needs assessed at this time.  Dylyn Mclaren RN MSN 

## 2018-12-12 ENCOUNTER — Other Ambulatory Visit: Payer: Medicaid Other

## 2018-12-14 NOTE — Progress Notes (Signed)
COVID Hotel Screening performed. Temperature, PHQ-9, and need for medical care and medications assessed. No additional needs assessed at this time.  Yousuf Ager RN MSN 

## 2018-12-18 NOTE — Congregational Nurse Program (Signed)
  Dept: [phone]   Congregational Nurse Program Note  Date of Encounter: 11/23/2018  Past Medical History: Past Medical History:  Diagnosis Date  . Diabetes mellitus without complication (HCC)   . Hepatitis C   . HIV infection (HCC)   . Schizophrenia (HCC)   . Substance abuse [facility])     Encounter Details: CNP Questionnaire - 11/23/18 1400      Questionnaire   Patient Status  Not Applicable    Race  Black or African American    Location Patient Served At  [employer]    Uninsured  Not Applicable    Food  Yes, have food insecurities;Within past 12 months, worried food would run out with no money to buy more;Within past 12 months, food ran out with no money to buy more    Housing/Utilities  No permanent housing    Transportation  No transportation needs    Interpersonal Safety  Yes, feel physically and emotionally safe where you currently live    Medication  No medication insecurities    Medical Provider  Yes    Referrals  Not Applicable    ED Visit Averted  Not Applicable    Life-Saving Intervention Made  Not Applicable      Encounter at [facility]. Client states he is settling in at hotel. He is having some issues with dizziness. Follow up with CN as needed. Shann Medal RN BSN CNP [phone]

## 2018-12-19 ENCOUNTER — Ambulatory Visit: Payer: Medicaid Other | Admitting: Family

## 2018-12-20 MED FILL — BIKTARVY 50-200-25 MG TABS: 50-200-25 | 30 days supply | Qty: 30 | Fill #3

## 2018-12-21 ENCOUNTER — Ambulatory Visit: Payer: Self-pay | Admitting: *Deleted

## 2018-12-21 ENCOUNTER — Other Ambulatory Visit: Payer: Self-pay | Admitting: Pharmacist

## 2018-12-21 ENCOUNTER — Telehealth: Payer: Self-pay | Admitting: *Deleted

## 2018-12-21 ENCOUNTER — Other Ambulatory Visit: Payer: Self-pay

## 2018-12-21 DIAGNOSIS — B20 Human immunodeficiency virus [HIV] disease: Secondary | ICD-10-CM

## 2018-12-21 NOTE — Progress Notes (Signed)
COVID Hotel Screening performed. Temperature, PHQ-9, and need for medical care and medications assessed. No additional needs assessed at this time.  Samual Beals RN MSN 
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
## 2018-12-25 ENCOUNTER — Telehealth: Payer: Self-pay | Admitting: *Deleted

## 2018-12-25 DIAGNOSIS — Z20822 Contact with and (suspected) exposure to covid-19: Secondary | ICD-10-CM

## 2018-12-25 NOTE — Telephone Encounter (Signed)
Order placed for COVID-19 testing.  

## 2018-12-28 NOTE — Progress Notes (Signed)
COVID Hotel Screening performed. Temperature, PHQ-9, and need for medical care and medications assessed. Patient agreed to the COVID-19 testing. No additional needs assessed at this time.  Brita Jurgensen RN MSN 

## 2019-01-02 LAB — NOVEL CORONAVIRUS, NAA: SARS-CoV-2, NAA: NOT DETECTED

## 2019-01-03 ENCOUNTER — Telehealth: Payer: Self-pay

## 2019-01-03 ENCOUNTER — Encounter: Payer: Self-pay | Admitting: *Deleted

## 2019-01-03 NOTE — Telephone Encounter (Signed)
Attempted to call patient to give COVID results. LVM to call back.

## 2019-01-03 NOTE — Progress Notes (Signed)
COVID Hotel Screening performed. Temperature, PHQ-9, and need for medical care and medications assessed. No additional needs assessed at this time.  Mikle Sternberg RN MSN 

## 2019-01-17 ENCOUNTER — Telehealth: Payer: Self-pay | Admitting: *Deleted

## 2019-01-17 NOTE — Telephone Encounter (Signed)
Call received from [place] confirming Derrick Cooper's information and medication adherence. Next refill is scheduled for 01/22/19.

## 2019-01-20 NOTE — Progress Notes (Signed)
[place] Screening performed. COVID screening, temperature, PHQ-9, and need for medical care and medications assessed. Has not been taking medications for one month. Request assistance with obtaining medications. Referral sent to [facility].  Arnold Long RN MSN

## 2019-01-22 MED FILL — BIKTARVY 50-200-25 MG TABS: 50-200-25 | 30 days supply | Qty: 30 | Fill #4

## 2019-01-27 NOTE — Progress Notes (Signed)
COVID Hotel Screening performed. Temperature, PHQ-9, and need for medical care and medications assessed. No additional needs assessed at this time.  Chava Dulac  MSN, RN 
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
## 2019-02-06 NOTE — Telephone Encounter (Signed)
  Frequency / Duration of [facility]N visits: Effective 12/21/2018: 11mo3 3 PRN's for complications with disease process/progression, medication changes or concerns   CB[facility]N will assess for learning needs related to diagnosis and treatment regimen, provide education as needed, fill pill box if needed, address any barriers which may be preventing medication compliance, and communicating with care team including physician and case manager.   Individualized Plan Of Care 12/21/2018 to 03/21/2019  a. Type of service(s) and care to be delivered: RN Case Management  b. Frequency and duration of service: Effective 12/21/2018: 46mo3  3 prns for  complications  with disease process/progression, medication changes or  concerns . Visits/Contact may be conducted telephonically or in person to  best suit the patient.  c. Activity restrictions: Pt may be up as tolerated and can safely ambulate without the need for a assistive device   d. Safety Measures: Standard Precautions/Infection Control   e. Service Objectives and Goals: Service Objectives are to assist the pt with HIV medication regimen adherence and staying in care with the Infectious Disease Clinic by identifying barriers to care. RN will address the barriers  that  are identified by the patient.   f. Equipment required: No additional equipment needs at this time   g. Functional Limitations: Vision. Pt has corrective glasses that she wears   h. Rehabilitation potential: Guarded   i. Diet and Nutritional Needs: Regular Diet   j. Medications and treatments: Medications have been reconciled and  reviewed and are a part of EPIC electronic file   k. Specific therapies if needed: RN   l. Pertinent diagnoses: HIV disease,  Hx of medication NonCompliance, homelessness  m. Expected outcome: Guarded

## 2019-02-07 NOTE — Telephone Encounter (Signed)
I have reviewed and agree with the plan as documented below.

## 2019-02-08 ENCOUNTER — Other Ambulatory Visit: Payer: Self-pay

## 2019-02-08 DIAGNOSIS — Z20822 Contact with and (suspected) exposure to covid-19: Secondary | ICD-10-CM

## 2019-02-14 LAB — NOVEL CORONAVIRUS, NAA: SARS-CoV-2, NAA: NOT DETECTED

## 2019-02-28 ENCOUNTER — Telehealth: Payer: Self-pay | Admitting: *Deleted

## 2019-02-28 NOTE — Telephone Encounter (Addendum)
Called Mr Ferris to assess his current needs and medication adherence. Mr Grahn stated he has about 2 more weeks of medication remaining and he is doing well with all other needs. Expressed to Mr Gunther that he should be running out of his medication soon and should have less than 2 weeks remaining. Mr Schweikert stated he has missed a couple of days of his medication but will get back to taking the medications daily. I also advised Mr Baney that we need to make an in office appointment for him as well. Transportation is a big issue for Mr Carlyon so we have to do labs and office visit on the same day unfortunately. Office visit rescheduled for Aug 4th at 3:15 with transportation arranged as well.   Call placed to [facility] and he placed a refill request for Aug 10th with a planned pick up.

## 2019-03-05 ENCOUNTER — Ambulatory Visit: Payer: Medicaid Other | Admitting: Family

## 2019-03-08 NOTE — Progress Notes (Signed)
COVID-19 Screening performed. Temperature, PHQ-9, and need for medical care and medications assessed.   Jobe Igo MSN, RN

## 2019-03-18 ENCOUNTER — Telehealth: Payer: Self-pay | Admitting: *Deleted

## 2019-03-18 NOTE — Telephone Encounter (Signed)
Spoke with Derrick Cooper to reassess his current needs. Derrick Cooper states he has about 3 days remaining of medication. Contacted [facility] for medications to be mailed to his current address. Address updated in [facility]

## 2019-03-19 MED FILL — BIKTARVY 50-200-25 MG TABS: 50-200-25 | 30 days supply | Qty: 30 | Fill #5

## 2019-03-26 NOTE — Progress Notes (Signed)
COVID-19 Screening performed. Temperature, PHQ-9, and need for medical care and medications assessed. No additional needs assessed at this time.  Alain Deschene MSN, RN 

## 2019-03-29 NOTE — Progress Notes (Signed)
COVID-19 Screening performed. Temperature, PHQ-9, and need for medical care and medications assessed. Pt referred for BH assistance.   Keyandra Swenson MSN, RN 

## 2019-03-31 NOTE — Progress Notes (Signed)
COVID-19 Screening performed. Temperature, PHQ-9, and need for medical care and medications assessed. No additional needs assessed at this time.  Josealfredo Adkins MSN, RN 

## 2019-04-01 NOTE — Progress Notes (Signed)
COVID-19 Screening performed. Temperature, PHQ-9, and need for medical care and medications assessed. No additional needs assessed at this time.  Javeon Macmurray MSN, RN 

## 2019-04-02 ENCOUNTER — Telehealth: Payer: Self-pay | Admitting: *Deleted

## 2019-04-02 NOTE — Telephone Encounter (Signed)
  Frequency / Duration of [facility] visits: Effective 03/22/2019: 50mo3 3 PRN's for complications with disease process/progression, medication changes or concerns   [facility]N will assess for learning needs related to diagnosis and treatment regimen, provide education as needed, fill pill box if needed, address any barriers which may be preventing medication compliance, and communicating with care team including physician and case manager.   Individualized Plan Of Care 03/22/2019 to 06/20/2019  a. Type of service(s) and care to be delivered: RN Case Management  b. Frequency and duration of service: Effective 06/20/2019: 39mo3  3 prns for  complications  with disease process/progression, medication changes or  concerns . Visits/Contact may be conducted telephonically or in person to  best suit the patient.  c. Activity restrictions: Pt may be up as tolerated and can safely ambulate without the need for a assistive device   d. Safety Measures: Standard Precautions/Infection Control   e. Service Objectives and Goals: Service Objectives are to assist the pt with HIV medication regimen adherence and staying in care with the Infectious Disease Clinic by identifying barriers to care. RN will address the barriers  that  are identified by the patient.   f. Equipment required: No additional equipment needs at this time   g. Functional Limitations: Vision. Pt has corrective glasses that she wears   h. Rehabilitation potential: Guarded   i. Diet and Nutritional Needs: Regular Diet   j. Medications and treatments: Medications have been reconciled and  reviewed and are a part of EPIC electronic file   k. Specific therapies if needed: RN   l. Pertinent diagnoses: HIV disease,  Hx of medication NonCompliance, homelessness  m. Expected outcome: Guarded

## 2019-04-02 NOTE — Telephone Encounter (Signed)
Late Entry: PRIOR ORDERS ENDED ON 03/21/19 WITH REQUEST FOR NEW ORDERS MADE ON 04/02/19. A VISIT WITH THE PATIENT HAS NOT BEEN MADE WITHOUT ORDERS IN PLACE.

## 2019-04-03 NOTE — Telephone Encounter (Signed)
I have reviewed and agree with the plan of care of Mr. Vannice in efforts to optimize his care.

## 2019-04-23 NOTE — Progress Notes (Signed)
COVID-19 Screening performed. Temperature, PHQ-9, and need for medical care and medications assessed. No additional needs assessed at this time.  Martena Emanuele MSN, RN 

## 2019-04-23 NOTE — Progress Notes (Signed)
COVID-19 Screening performed. Temperature, PHQ-9, and need for medical care and medications assessed. No additional needs assessed at this time.  Chosen Garron MSN, RN 

## 2019-06-14 ENCOUNTER — Other Ambulatory Visit: Payer: Self-pay

## 2019-06-14 DIAGNOSIS — B20 Human immunodeficiency virus [HIV] disease: Secondary | ICD-10-CM

## 2019-06-14 MED ORDER — BIKTARVY 50-200-25 MG PO TABS: 1.0000 | ORAL_TABLET | Freq: Every day | ORAL | 0 refills | Status: DC

## 2019-06-17 ENCOUNTER — Telehealth: Payer: Self-pay | Admitting: *Deleted

## 2019-06-17 ENCOUNTER — Ambulatory Visit: Payer: Self-pay | Admitting: *Deleted

## 2019-06-17 DIAGNOSIS — Z5900 Homelessness unspecified: Secondary | ICD-10-CM

## 2019-06-17 DIAGNOSIS — B20 Human immunodeficiency virus [HIV] disease: Secondary | ICD-10-CM

## 2019-06-18 NOTE — Telephone Encounter (Signed)
  Frequency / Duration of [facility] visits: Effective 06/21/2019: 17mo3 3 PRN's for complications with disease process/progression, medication changes or concerns   [facility]N will assess for learning needs related to diagnosis and treatment regimen, provide education as needed, fill pill box if needed, address any barriers which may be preventing medication compliance, and communicating with care team including physician and case manager.   Individualized Plan Of Care 06/21/2019 to 09/19/2019  a. Type of service(s) and care to be delivered: RN Case Management  b. Frequency and duration of service: Effective 06/21/2019: 81mo3  3 prns for complications with disease process/progression, medication changes or concerns . Visits/Contact may be conducted telephonically or in person to best suit the patient.  c. Activity restrictions: Pt may be up as tolerated and can safely ambulate without the need for a assistive device   d. Safety Measures: Standard Precautions/Infection Control   e. Service Objectives and Goals: Service Objectives are to assist the pt with HIV medication regimen adherence and staying in care with the Infectious Disease Clinic by identifying barriers to care. Goal is also include offering assistance with securing housing and basic needs. RN will address the barriers that  are identified by the patient.   f. Equipment required: No additional equipment needs at this time   g. Functional Limitations: Vision. Pt has corrective glasses that she wears   h. Rehabilitation potential: Guarded   i. Diet and Nutritional Needs: Regular Diet   j. Medications and treatments: Medications have been reconciled and reviewed and are a part of EPIC electronic file   k. Specific therapies if needed: RN   l. Pertinent diagnoses: HIV disease,  Hx of medication NonCompliance, homelessness, schizophrenia  m. Expected outcome: Guarded

## 2019-06-19 NOTE — Telephone Encounter (Signed)
Reviewed and agree with plan of care for Mr. Keetch as written by Kinnie Scales, RN.

## 2019-06-20 ENCOUNTER — Telehealth: Payer: Self-pay | Admitting: *Deleted

## 2019-06-20 ENCOUNTER — Ambulatory Visit: Payer: Self-pay | Admitting: *Deleted

## 2019-06-20 DIAGNOSIS — B20 Human immunodeficiency virus [HIV] disease: Secondary | ICD-10-CM

## 2019-06-20 DIAGNOSIS — Z5900 Homelessness unspecified: Secondary | ICD-10-CM

## 2019-06-20 MED FILL — BIKTARVY 50-200-25 MG TABS: 50-200-25 | 30 days supply | Qty: 30 | Fill #0

## 2019-06-20 NOTE — Telephone Encounter (Signed)
Call made to [facility] for a medication reconciliation. Mr. Lagman has a hx of DM with HgbA1c of 13.5 in May along with Hypertension and elevated cholesterol.   Mr. Legault just had a in-office physical on Nov 3rd with the following medications prescribed  Lipitor 40mg  daily Lisinopril 5mg  PO daily Metformin 500mg  PO daily Trazadone 50mg  PO  Lantus 100unit/ml 0.38ml  I would like to be sure he has a upcoming appt with Beverly Sessions so he can continue to receive his monthly Abilify injections.   Called [facility] pharmacy and medications were last filled in August. I have been unable to locate Mr. Wolfinger due to homelessness and he does not have a phone. I have requested a refill of the medications and will take the medications to the patient.

## 2019-06-20 NOTE — Telephone Encounter (Signed)
Spoke with Derrick Cooper and Mr. Durio was present. Mr Piedra does need his Phillips Odor and will be available today. Confirmed with his that he is aware of his upcoming appt with Marya Amsler on Monday morning. Visit scheduled for today

## 2019-06-24 ENCOUNTER — Other Ambulatory Visit: Payer: Self-pay

## 2019-06-24 ENCOUNTER — Ambulatory Visit (INDEPENDENT_AMBULATORY_CARE_PROVIDER_SITE_OTHER): Payer: Medicaid Other | Admitting: Family

## 2019-06-24 ENCOUNTER — Encounter: Payer: Self-pay | Admitting: Family

## 2019-06-24 VITALS — BP 120/62 | HR 106 | Temp 98.4°F | Wt 148.0 lb

## 2019-06-24 DIAGNOSIS — Z0000 Encounter for general adult medical examination without abnormal findings: Secondary | ICD-10-CM

## 2019-06-24 DIAGNOSIS — B20 Human immunodeficiency virus [HIV] disease: Secondary | ICD-10-CM | POA: Diagnosis not present

## 2019-06-24 DIAGNOSIS — Z113 Encounter for screening for infections with a predominantly sexual mode of transmission: Secondary | ICD-10-CM | POA: Diagnosis not present

## 2019-06-24 DIAGNOSIS — B182 Chronic viral hepatitis C: Secondary | ICD-10-CM

## 2019-06-24 DIAGNOSIS — Z23 Encounter for immunization: Secondary | ICD-10-CM

## 2019-06-24 DIAGNOSIS — Z720 Tobacco use: Secondary | ICD-10-CM | POA: Diagnosis not present

## 2019-06-24 DIAGNOSIS — Z79899 Other long term (current) drug therapy: Secondary | ICD-10-CM

## 2019-06-24 MED ORDER — BIKTARVY 50-200-25 MG PO TABS: 1.0000 | ORAL_TABLET | Freq: Every day | ORAL | 3 refills | Status: DC

## 2019-06-24 NOTE — Assessment & Plan Note (Signed)
   Pneumovax updated today.  Discussed importance of safe sexual practice to reduce risk of STI.  Condoms declined.  Due for colonoscopy and colon cancer screening.

## 2019-06-24 NOTE — Patient Instructions (Signed)
Nice to see you.   We will check your blood work today.  Continue to take your Earth as prescribed.  If you need assistance with medication please let us know.   We will work to get you established with [facility].   Continue to work with [employer].   Plan for follow up in 3 months or sooner if needed with lab work on the same day.  Have a great day and stay safe!  Happy Holidays!

## 2019-06-24 NOTE — Assessment & Plan Note (Signed)
Mr. Pinkerton appears to have adequately controlled HIV disease with good adherence and tolerance to his ART regimen of Biktarvy despite missing about 3 weeks of medication due to his homeless status.  No signs/symptoms of opportunistic infection or progressive HIV disease at present.  Reviewed previous lab work and plan of care with questions answered.  Discussed importance of taking medication as prescribed to prevent progression of HIV disease.  Check blood work today.  Continue current dose of Biktarvy.  Referred to [facility] to help with social problems as able.  Plan for follow-up in 3 months or sooner if needed with lab work 1 to 2 weeks prior to appointment or on same day.

## 2019-06-24 NOTE — Assessment & Plan Note (Signed)
Discussed importance of tobacco cessation to reduce risk of cardiovascular, respiratory, and malignant disease in the future.  He is in the precontemplation stage of quitting and has no desire to quit at the current time.  To continue to monitor.

## 2019-06-24 NOTE — Progress Notes (Signed)
Subjective:    Patient ID: Derrick Cooper, male    DOB: [birth year], 58 y.o.   MRN: [id number]  Chief Complaint  Patient presents with  . Follow-up    no complaints; declined condoms; received flu shot   . HIV Positive/AIDS     HPI:  Derrick Cooper is a 58 y.o. male with HIV disease, diabetes, schizoaffective disorder, and hyperlipidemia last seen in the office on 06/26/2018 with good adherence and tolerance to his ART regimen of Biktarvy with a viral load that was undetectable and CD4 count of 390.  Unfortunately Derrick Cooper has not been seen since that time but has been working with our out reach nurse.  No recent blood work is available for review.  He has received his influenza vaccination on 06/04/2019.  Healthcare maintenance due includes Prevnar.  Derrick Cooper continues to take his Biktarvy as prescribed with no adverse side effects.  He has missed about 3 weeks worth of medication in October otherwise no problems.  Currently working with a Tourist information centre manager for housing.  He has no new concerns/complaints and is feeling well. Denies fevers, chills, night sweats, headaches, changes in vision, neck pain/stiffness, nausea, diarrhea, vomiting, lesions or rashes.  Derrick Cooper continues to remain covered through [facility] and has no problems obtaining his medication from the pharmacy.  He is currently staying at a shelter and working with case management to help find permanent housing and has a voucher at present.  This is complicated by he has a pending case that has been postponed until February.  Denies any feelings of being down, depressed, or hopeless recently.  He uses marijuana "when I can get it".  Continues to smoke tobacco about a rate of half pack per day.  No alcohol consumption presently.  He is not currently sexually active.  No Known Allergies    Outpatient Medications Prior to Visit  Medication Sig Dispense Refill  . atorvastatin (LIPITOR) 40 MG tablet Take by mouth.    . metoprolol  succinate (TOPROL-XL) 50 MG 24 hr tablet Take 1 tablet (50 mg total) by mouth daily. Take with or immediately following a meal. 30 tablet 0  . bictegravir-emtricitabine-tenofovir AF (BIKTARVY) 50-200-25 MG TABS tablet Take 1 tablet by mouth daily. 30 tablet 0  . ACCU-CHEK AVIVA PLUS test strip 1 each by Other route 3 (three) times daily. As directed: For diabetes management 100 each 1  . ACCU-CHEK SOFTCLIX LANCETS lancets 1 each by Other route 3 (three) times daily. As directed: For diabetes management 1 each 0  . ARIPiprazole ER (ABILIFY MAINTENA) 400 MG PRSY prefilled syringe Inject into the muscle.    . B-D UF III MINI PEN NEEDLES 31G X 5 MM MISC USE AS DIRECTED QID: For diabetes management 1 each 0  . benztropine (COGENTIN) 0.5 MG tablet Take 1 tablet (0.5 mg total) by mouth 2 (two) times daily. 60 tablet 0  . Blood Glucose Monitoring Suppl (ACCU-CHEK AVIVA PLUS) w/Device KIT Apply 1 Device topically 3 (three) times daily. As directed: Diabetes management  0  . hydrOXYzine (ATARAX/VISTARIL) 50 MG tablet Take 1 tablet (50 mg total) by mouth every 6 (six) hours as needed for anxiety. 30 tablet 0  . insulin aspart protamine- aspart (NOVOLOG MIX 70/30) (70-30) 100 UNIT/ML injection Inject 0.15 mLs (15 Units total) into the skin 3 (three) times daily with meals. 10 mL 0  . insulin glargine (LANTUS) 100 UNIT/ML injection Inject 0.3 mLs (30 Units total) into the skin 2 (two)  times daily. 10 mL 0  . Insulin Pen Needle (PEN NEEDLES 3/16") 31G X 5 MM MISC 1 mL by Does not apply route QID. For diabetes management 1 each 0  . metFORMIN (GLUCOPHAGE) 500 MG tablet Take 1 tablet (500 mg total) by mouth daily with breakfast. 30 tablet 0  . nicotine (NICODERM CQ - DOSED IN MG/24 HOURS) 21 mg/24hr patch Place 1 patch (21 mg total) onto the skin daily. 28 patch 0  . perphenazine (TRILAFON) 8 MG tablet Take 1 tablet (8 mg total) by mouth 3 (three) times daily. 30 tablet 0  . traZODone (DESYREL) 50 MG tablet Take 1  tablet (50 mg total) by mouth at bedtime and may repeat dose one time if needed. 30 tablet 0  . metoprolol succinate (TOPROL-XL) 100 MG 24 hr tablet Take 1 tablet (100 mg total) by mouth daily. For high blood pressureTake with or immediately following a meal. (Patient not taking: Reported on 08/08/2018)     No facility-administered medications prior to visit.      Past Medical History:  Diagnosis Date  . Diabetes mellitus without complication ([place])   . Hepatitis C   . HIV infection ([place])   . Schizophrenia ([place])   . Substance abuse ([place])      History reviewed. No pertinent surgical history.   Review of Systems  Constitutional: Negative for appetite change, chills, fatigue, fever and unexpected weight change.  Eyes: Negative for visual disturbance.  Respiratory: Negative for cough, chest tightness, shortness of breath and wheezing.   Cardiovascular: Negative for chest pain and leg swelling.  Gastrointestinal: Negative for abdominal pain, constipation, diarrhea, nausea and vomiting.  Genitourinary: Negative for dysuria, flank pain, frequency, genital sores, hematuria and urgency.  Skin: Negative for rash.  Allergic/Immunologic: Negative for immunocompromised state.  Neurological: Negative for dizziness and headaches.      Objective:    BP 120/62   Pulse (!) 106   Temp 98.4 F (36.9 C) (Oral)   Wt 148 lb (67.1 kg)   BMI 19.00 kg/m  Nursing note and vital signs reviewed.  Physical Exam Constitutional:      General: He is not in acute distress.    Appearance: He is well-developed.     Comments: Patch over left eye; seated in a chair; pleasant  Eyes:     Conjunctiva/sclera: Conjunctivae normal.  Neck:     Musculoskeletal: Neck supple.  Cardiovascular:     Rate and Rhythm: Normal rate and regular rhythm.     Heart sounds: Normal heart sounds. No murmur. No friction rub. No gallop.   Pulmonary:     Effort: Pulmonary effort is normal. No respiratory distress.     Breath  sounds: Normal breath sounds. No wheezing or rales.  Chest:     Chest wall: No tenderness.  Abdominal:     General: Bowel sounds are normal.     Palpations: Abdomen is soft.     Tenderness: There is no abdominal tenderness.  Lymphadenopathy:     Cervical: No cervical adenopathy.  Skin:    General: Skin is warm and dry.     Findings: No rash.  Neurological:     Mental Status: He is alert and oriented to person, place, and time.  Psychiatric:        Behavior: Behavior normal.        Thought Content: Thought content normal.        Judgment: Judgment normal.      Depression screen [facility] 2/9 06/24/2019  04/23/2019 04/23/2019 03/31/2019 03/29/2019  Decreased Interest 0 0 0 0 3  Down, Depressed, Hopeless 0 0 0 0 3  PHQ - 2 Score 0 0 0 0 6  Altered sleeping - 0 0 0 3  Tired, decreased energy - 0 0 0 1  Change in appetite - 0 0 0 0  Feeling bad or failure about yourself  - 0 0 0 2  Trouble concentrating - 0 0 0 0  Moving slowly or fidgety/restless - 0 0 0 0  Suicidal thoughts - 0 0 0 0  PHQ-9 Score - 0 0 0 12  Some recent data might be hidden       Assessment & Plan:    Patient Active Problem List   Diagnosis Date Noted  . MDD (major depressive disorder), recurrent episode, severe ([place]) 08/09/2018  . Schizoaffective disorder, bipolar type ([place])   . Health care maintenance 06/26/2018  . Tobacco use 06/26/2018  . Furuncle of right axilla 11/13/2017  . Hyperlipidemia associated with type 2 diabetes mellitus ([place]) 10/25/2017  . 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 HIV disease ([place]) 09/27/2017  . Chronic hepatitis C without hepatic coma ([place]) 09/27/2017  . Acute non-recurrent maxillary sinusitis 09/27/2017  . Schizophrenia ([place]) 09/07/2017  . Uncontrolled type 2 diabetes mellitus with microalbuminuria, with long-term current use of insulin ([place]) 09/07/2017  . Urinary incontinence  09/07/2017     Problem List Items Addressed This Visit      Digestive   Chronic hepatitis C without hepatic coma (HCC)   Relevant Medications   bictegravir-emtricitabine-tenofovir AF (BIKTARVY) 50-200-25 MG TABS tablet     Other   HIV disease ([place]) - Primary    Derrick Cooper appears to have adequately controlled HIV disease with good adherence and tolerance to his ART regimen of Biktarvy despite missing about 3 weeks of medication due to his homeless status.  No signs/symptoms of opportunistic infection or progressive HIV disease at present.  Reviewed previous lab work and plan of care with questions answered.  Discussed importance of taking medication as prescribed to prevent progression of HIV disease.  Check blood work today.  Continue current dose of Biktarvy.  Referred to [facility] to help with social problems as able.  Plan for follow-up in 3 months or sooner if needed with lab work 1 to 2 weeks prior to appointment or on same day.      Relevant Medications   bictegravir-emtricitabine-tenofovir AF (BIKTARVY) 50-200-25 MG TABS tablet   Other Relevant Orders   CBC w/Diff (Completed)   COMPLETE METABOLIC PANEL WITH GFR   HIV-1 RNA quant-no reflex-bld   T-helper cell (CD4)- ([facility] clinic only)   Pneumococcal polysaccharide vaccine 23-valent greater than or equal to 2yo subcutaneous/IM (Completed)   Health care maintenance     Pneumovax updated today.  Discussed importance of safe sexual practice to reduce risk of STI.  Condoms declined.  Due for colonoscopy and colon cancer screening.      Tobacco use    Discussed importance of tobacco cessation to reduce risk of cardiovascular, respiratory, and malignant disease in the future.  He is in the precontemplation stage of quitting and has no desire to quit at the current time.  To continue to monitor.       Other Visit Diagnoses    Screening for STDs (sexually transmitted diseases)       Relevant Orders   RPR  Pharmacologic therapy       Relevant Orders   Lipid panel   Need for pneumococcal vaccine       Relevant Orders   Pneumococcal polysaccharide vaccine 23-valent greater than or equal to 2yo subcutaneous/IM (Completed)       I am having Staci L. Allebach maintain his Accu-Chek Aviva Plus, Accu-Chek Softclix Lancets, B-D UF III MINI PEN NEEDLES, Accu-Chek Aviva Plus, Pen Needles 3/16", metoprolol succinate, hydrOXYzine, perphenazine, nicotine, traZODone, insulin aspart protamine- aspart, insulin glargine, metFORMIN, benztropine, Abilify Maintena, atorvastatin, and Biktarvy.   Meds ordered this encounter  Medications  . bictegravir-emtricitabine-tenofovir AF (BIKTARVY) 50-200-25 MG TABS tablet    Sig: Take 1 tablet by mouth daily.    Dispense:  30 tablet    Refill:  3    Order Specific Question:   Supervising Provider    Answer:   Carlyle Basques [[id number]]     Follow-up: Return in about 3 months (around 09/24/2019), or if symptoms worsen or fail to improve.   Terri Piedra, MSN, FNP-C Nurse Practitioner [facility] [place] number: [phone]

## 2019-06-25 LAB — T-HELPER CELL (CD4) - (RCID CLINIC ONLY)
CD4 % Helper T Cell: 19 % — ABNORMAL LOW (ref 33–65)
CD4 T Cell Abs: 314 /uL — ABNORMAL LOW (ref 400–1790)

## 2019-07-01 LAB — CBC WITH DIFFERENTIAL/PLATELET
Absolute Monocytes: 320 cells/uL (ref 200–950)
Basophils Absolute: 19 cells/uL (ref 0–200)
Basophils Relative: 0.3 %
Eosinophils Absolute: 32 cells/uL (ref 15–500)
Eosinophils Relative: 0.5 %
HCT: 43.7 % (ref 38.5–50.0)
Hemoglobin: 14.4 g/dL (ref 13.2–17.1)
Lymphs Abs: 1594 cells/uL (ref 850–3900)
MCH: 29.7 pg (ref 27.0–33.0)
MCHC: 33 g/dL (ref 32.0–36.0)
MCV: 90.1 fL (ref 80.0–100.0)
MPV: 11.3 fL (ref 7.5–12.5)
Monocytes Relative: 5 %
Neutro Abs: 4435 cells/uL (ref 1500–7800)
Neutrophils Relative %: 69.3 %
Platelets: 164 10*3/uL (ref 140–400)
RBC: 4.85 10*6/uL (ref 4.20–5.80)
RDW: 13.3 % (ref 11.0–15.0)
Total Lymphocyte: 24.9 %
WBC: 6.4 10*3/uL (ref 3.8–10.8)

## 2019-07-01 LAB — LIPID PANEL
Cholesterol: 158 mg/dL (ref <200)
HDL: 44 mg/dL (ref >=40)
LDL Cholesterol (Calc): 100 mg/dL (calc) — ABNORMAL HIGH
Non-HDL Cholesterol (Calc): 114 mg/dL (calc) (ref <130)
Total CHOL/HDL Ratio: 3.6 (calc) (ref <5.0)
Triglycerides: 48 mg/dL (ref <150)

## 2019-07-01 LAB — COMPLETE METABOLIC PANEL WITH GFR
AG Ratio: 1.3 (calc) (ref 1.0–2.5)
ALT: 5 U/L — ABNORMAL LOW (ref 9–46)
AST: 14 U/L (ref 10–35)
Albumin: 4.1 g/dL (ref 3.6–5.1)
Alkaline phosphatase (APISO): 71 U/L (ref 35–144)
BUN: 12 mg/dL (ref 7–25)
CO2: 26 mmol/L (ref 20–32)
Calcium: 9.5 mg/dL (ref 8.6–10.3)
Chloride: 105 mmol/L (ref 98–110)
Creat: 1.06 mg/dL (ref 0.70–1.33)
GFR, Est African American: 90 mL/min/{1.73_m2} (ref >=60)
GFR, Est Non African American: 78 mL/min/{1.73_m2} (ref >=60)
Globulin: 3.1 g/dL (calc) (ref 1.9–3.7)
Glucose, Bld: 143 mg/dL — ABNORMAL HIGH (ref 65–99)
Potassium: 4.4 mmol/L (ref 3.5–5.3)
Sodium: 139 mmol/L (ref 135–146)
Total Bilirubin: 0.8 mg/dL (ref 0.2–1.2)
Total Protein: 7.2 g/dL (ref 6.1–8.1)

## 2019-07-01 LAB — RPR: RPR Ser Ql: REACTIVE — AB

## 2019-07-01 LAB — FLUORESCENT TREPONEMAL AB(FTA)-IGG-BLD: Fluorescent Treponemal ABS: REACTIVE — AB

## 2019-07-01 LAB — HIV-1 RNA QUANT-NO REFLEX-BLD
HIV 1 RNA Quant: <20 copies/mL
HIV-1 RNA Quant, Log: <1.3 Log copies/mL

## 2019-07-01 LAB — RPR TITER: RPR Titer: 1:1 {titer} — ABNORMAL HIGH

## 2019-07-11 ENCOUNTER — Other Ambulatory Visit: Payer: Self-pay | Admitting: Family

## 2019-07-11 DIAGNOSIS — B20 Human immunodeficiency virus [HIV] disease: Secondary | ICD-10-CM

## 2019-07-16 MED FILL — BIKTARVY 50-200-25 MG TABS: 50-200-25 | 30 days supply | Qty: 30 | Fill #0

## 2019-07-31 ENCOUNTER — Other Ambulatory Visit: Payer: Self-pay

## 2019-07-31 NOTE — Progress Notes (Addendum)
Drove to [employer] [facility] who stated the patient is living at [facility]. He has a Advertising account executive Rogers Blocker)  At [phone] that can be contacted for assistance.

## 2019-07-31 NOTE — Progress Notes (Signed)
Prior to today's visit, traveled to [facility] and picked up the patient's Biktarvy.   Traveled to [facility] and located the patient. Medication given at this time. [employer] options discussed and given to the patient.  Referral completed for Housing assistance with [facility]. Reminded the patient of his upcoming appt and patient denied transportation needs at this time.

## 2019-08-01 ENCOUNTER — Telehealth: Payer: Self-pay | Admitting: *Deleted

## 2019-08-01 NOTE — Telephone Encounter (Signed)
Call placed to [facility] case manager(Qua'Shawn Pernell) with a message left. I would like to know how to reach Mr. Squillace.

## 2019-08-16 ENCOUNTER — Other Ambulatory Visit: Payer: Self-pay | Admitting: *Deleted

## 2019-08-16 DIAGNOSIS — Z20822 Contact with and (suspected) exposure to covid-19: Secondary | ICD-10-CM

## 2019-08-17 LAB — NOVEL CORONAVIRUS, NAA: SARS-CoV-2, NAA: NOT DETECTED

## 2019-09-06 ENCOUNTER — Other Ambulatory Visit: Payer: Self-pay

## 2019-09-06 DIAGNOSIS — Z20822 Contact with and (suspected) exposure to covid-19: Secondary | ICD-10-CM

## 2019-09-08 LAB — NOVEL CORONAVIRUS, NAA: SARS-CoV-2, NAA: NOT DETECTED

## 2019-09-13 ENCOUNTER — Telehealth: Payer: Self-pay | Admitting: *Deleted

## 2019-09-13 NOTE — Telephone Encounter (Addendum)
Unable to reach anyone at [facility]. I reached out to [facility] again (last time I drove by the were closed) I spoke with a staff member who stated Derrick Cooper is housed at the [facility] again.   [place] and spoke with Derrick Cooper. Derrick Cooper stated he has switched his medications from [place] to [facility]. Derrick Cooper stated he already has his medications and no longer needs my services. I asked Derrick Cooper to please keep my number and in the further to please give me a call if he needs me.   The intent of this communication is to inform the Health Care Team that this patient will be discharged from [facility]) with goals met.  Effective 09/13/19 patient will be discharged and removed from [facility] active patient listing.

## 2019-09-23 NOTE — Telephone Encounter (Signed)
Noted. Thanks.

## 2019-09-24 ENCOUNTER — Ambulatory Visit: Payer: Medicaid Other | Admitting: Family

## 2019-09-27 ENCOUNTER — Other Ambulatory Visit: Payer: Self-pay

## 2019-09-27 DIAGNOSIS — Z20822 Contact with and (suspected) exposure to covid-19: Secondary | ICD-10-CM

## 2019-09-29 LAB — NOVEL CORONAVIRUS, NAA: SARS-CoV-2, NAA: NOT DETECTED

## 2019-10-02 ENCOUNTER — Inpatient Hospital Stay (HOSPITAL_COMMUNITY)
Admission: AD | Admit: 2019-10-02 | Discharge: 2019-10-04 | DRG: 885 | Disposition: A | Discharge status: Home / Self Care | Payer: Medicaid Other | Attending: Psychiatry | Admitting: Psychiatry

## 2019-10-02 ENCOUNTER — Other Ambulatory Visit: Payer: Self-pay | Admitting: Behavioral Health

## 2019-10-02 ENCOUNTER — Other Ambulatory Visit: Payer: Self-pay

## 2019-10-02 ENCOUNTER — Encounter (HOSPITAL_COMMUNITY): Payer: Self-pay | Admitting: Behavioral Health

## 2019-10-02 DIAGNOSIS — Z8249 Family history of ischemic heart disease and other diseases of the circulatory system: Secondary | ICD-10-CM | POA: Diagnosis not present

## 2019-10-02 DIAGNOSIS — I959 Hypotension, unspecified: Secondary | ICD-10-CM | POA: Diagnosis present

## 2019-10-02 DIAGNOSIS — F250 Schizoaffective disorder, bipolar type: Principal | ICD-10-CM | POA: Diagnosis present

## 2019-10-02 DIAGNOSIS — Z20822 Contact with and (suspected) exposure to covid-19: Secondary | ICD-10-CM | POA: Diagnosis present

## 2019-10-02 DIAGNOSIS — R45851 Suicidal ideations: Secondary | ICD-10-CM | POA: Diagnosis present

## 2019-10-02 DIAGNOSIS — R45850 Homicidal ideations: Secondary | ICD-10-CM | POA: Diagnosis present

## 2019-10-02 DIAGNOSIS — Z801 Family history of malignant neoplasm of trachea, bronchus and lung: Secondary | ICD-10-CM

## 2019-10-02 DIAGNOSIS — Z590 Homelessness: Secondary | ICD-10-CM

## 2019-10-02 DIAGNOSIS — Z21 Asymptomatic human immunodeficiency virus [HIV] infection status: Secondary | ICD-10-CM | POA: Diagnosis present

## 2019-10-02 DIAGNOSIS — Z803 Family history of malignant neoplasm of breast: Secondary | ICD-10-CM

## 2019-10-02 DIAGNOSIS — F1210 Cannabis abuse, uncomplicated: Secondary | ICD-10-CM | POA: Diagnosis present

## 2019-10-02 LAB — RESPIRATORY PANEL BY RT PCR (FLU A&B, COVID)
Influenza A by PCR: NEGATIVE
Influenza B by PCR: NEGATIVE
SARS Coronavirus 2 by RT PCR: NEGATIVE

## 2019-10-02 LAB — GLUCOSE, CAPILLARY: Glucose-Capillary: 89 mg/dL (ref 70–99)

## 2019-10-02 MED ORDER — TRAZODONE HCL 50 MG PO TABS
50.0000 mg | ORAL_TABLET | Freq: Every evening | ORAL | Status: DC | PRN
  Administered 2019-10-02: 50 mg via ORAL
  Filled 2019-10-02: qty 1
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
## 2019-10-02 NOTE — Progress Notes (Signed)
Patient ID: Derrick Cooper, male   DOB: 06-21-1961, 59 y.o.   MRN: [id number]  Pt alert and oriented on the unit and sitting on his bed in his room. Pt given a sandwich upon request. Pt's affect is anxious. Belongings secured, COVID-19 test complete. Education, support, and encouragement provided, q15 minute safety checks in effect. Pt remains safe on the unit.
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
## 2019-10-02 NOTE — Progress Notes (Signed)
Pt accepted to [id number]

## 2019-10-03 DIAGNOSIS — F250 Schizoaffective disorder, bipolar type: Principal | ICD-10-CM

## 2019-10-03 LAB — COMPREHENSIVE METABOLIC PANEL
ALT: 13 U/L (ref 0–44)
AST: 18 U/L (ref 15–41)
Albumin: 3.8 g/dL (ref 3.5–5.0)
Alkaline Phosphatase: 71 U/L (ref 38–126)
Anion gap: 10 (ref 5–15)
BUN: 14 mg/dL (ref 6–20)
CO2: 23 mmol/L (ref 22–32)
Calcium: 9 mg/dL (ref 8.9–10.3)
Chloride: 103 mmol/L (ref 98–111)
Creatinine, Ser: 0.74 mg/dL (ref 0.61–1.24)
GFR calc Af Amer: >60 mL/min (ref >60)
GFR calc non Af Amer: >60 mL/min (ref >60)
Glucose, Bld: 116 mg/dL — ABNORMAL HIGH (ref 70–99)
Potassium: 3.5 mmol/L (ref 3.5–5.1)
Sodium: 136 mmol/L (ref 135–145)
Total Bilirubin: 1.5 mg/dL — ABNORMAL HIGH (ref 0.3–1.2)
Total Protein: 8.1 g/dL (ref 6.5–8.1)

## 2019-10-03 LAB — RAPID URINE DRUG SCREEN, HOSP PERFORMED
Amphetamines: NOT DETECTED
Barbiturates: NOT DETECTED
Benzodiazepines: NOT DETECTED
Cocaine: NOT DETECTED
Opiates: NOT DETECTED
Tetrahydrocannabinol: POSITIVE — AB

## 2019-10-03 LAB — CBC
HCT: 44.4 % (ref 39.0–52.0)
Hemoglobin: 15 g/dL (ref 13.0–17.0)
MCH: 30.2 pg (ref 26.0–34.0)
MCHC: 33.8 g/dL (ref 30.0–36.0)
MCV: 89.3 fL (ref 80.0–100.0)
Platelets: 160 10*3/uL (ref 150–400)
RBC: 4.97 MIL/uL (ref 4.22–5.81)
RDW: 12.7 % (ref 11.5–15.5)
WBC: 10.1 10*3/uL (ref 4.0–10.5)
nRBC: 0 % (ref 0.0–0.2)

## 2019-10-03 LAB — LIPID PANEL
Cholesterol: 179 mg/dL (ref 0–200)
HDL: 56 mg/dL (ref >40)
LDL Cholesterol: 111 mg/dL — ABNORMAL HIGH (ref 0–99)
Total CHOL/HDL Ratio: 3.2 RATIO
Triglycerides: 60 mg/dL (ref <150)
VLDL: 12 mg/dL (ref 0–40)

## 2019-10-03 LAB — HEMOGLOBIN A1C
Hgb A1c MFr Bld: 6.6 % — ABNORMAL HIGH (ref 4.8–5.6)
Mean Plasma Glucose: 142.72 mg/dL

## 2019-10-03 LAB — TSH: TSH: 0.504 u[IU]/mL (ref 0.350–4.500)

## 2019-10-03 LAB — ETHANOL: Alcohol, Ethyl (B): <10 mg/dL (ref <10)

## 2019-10-03 MED ORDER — QUETIAPINE FUMARATE 25 MG PO TABS
25.0000 mg | ORAL_TABLET | Freq: Two times a day (BID) | ORAL | Status: DC
  Administered 2019-10-03 – 2019-10-04 (×2): 25 mg via ORAL
  Filled 2019-10-03 (×6): qty 1

## 2019-10-03 MED ORDER — QUETIAPINE FUMARATE 100 MG PO TABS
100.0000 mg | ORAL_TABLET | Freq: Every day | ORAL | Status: DC
  Administered 2019-10-03: 100 mg via ORAL
  Filled 2019-10-03 (×3): qty 1

## 2019-10-03 NOTE — Progress Notes (Signed)
Recreation Therapy Notes  INPATIENT RECREATION THERAPY ASSESSMENT  Patient Details Name: Derrick Cooper MRN: [id number] DOB: [birth year] Today's Date: 10/03/2019       Information Obtained From: Patient  Able to Participate in Assessment/Interview: Yes  Patient Presentation: Alert  Reason for Admission (Per Patient): Med Non-Compliance  Patient Stressors: Other (Comment)(Hallucinations; Homeless)  Coping Skills:   Isolation, TV, Sports, Exercise, Substance Abuse, Avoidance  Leisure Interests (2+):  Exercise - Walking, Individual - Reading  Frequency of Recreation/Participation: Other (Comment)(Read- When I can get a book; Walk- Daily)  Awareness of Community Resources:  Yes  Community Resources:  Library, [employer]  Current Use: (Pt uses the park but stated he can't use [employer].)  If no, Barriers?:    Expressed Interest in [employer] Information: No  [employer] of Residence:  [place]  Patient Main Form of Transportation: Walk  Patient Strengths:  "I don't know"  Patient Identified Areas of Improvement:  Manage money better; Get a permanent place to stay; Stop drinking/smoking weed  Patient Goal for Hospitalization:  "stay on medication"  Current SI (including self-harm):  No  Current HI:  No  Current AVH: No  Staff Intervention Plan: Group Attendance, Collaborate with Interdisciplinary Treatment Team  Consent to Intern Participation: N/A    Caroll Rancher, LRT/CTRS  Caroll Rancher A 10/03/2019, 11:52 AM

## 2019-10-03 NOTE — Progress Notes (Signed)
Recreation Therapy Notes  Date: 3.4.21 Time: 1000 Location: 500 Hall Dayroom  Group Topic: Self-Esteem  Goal Area(s) Addresses:  Patient will successfully identify positive attributes about themselves.  Patient will successfully identify benefit of improved self-esteem.   Behavioral Response: Engaged  Intervention: Colored pencils, blank crest  Activity: Crest of Arms.  Patients were given a blank crest divided into four sections.  Patients were to identify four things that make them unique for each section of the crest.  Patients could highlight things such as accomplishments, characteristics, important dates, etc.  Education:  Self-Esteem, Discharge Planning.   Education Outcome: Acknowledges education/In group clarification offered/Needs additional education  Clinical Observations/Feedback: Pt was engaged during activity.  Pt was able to identify positive qualities as being a giving/caring person and likes to listen to people.  Pt expressed it was hard to come up with more than the two qualities he identified.  Pt explained that some people, such as himself, resort to self destructive behavior when they don't see a way out or when people constantly harp on the negative things they have done in the past.  Pt also admitted being able to name the negative things all day long but difficult to identify the positive.    Caroll Rancher, LRT/CTRS    Caroll Rancher A 10/03/2019 11:29 AM

## 2019-10-03 NOTE — BHH Suicide Risk Assessment (Signed)
[facility] Admission Suicide Risk Assessment   Nursing information obtained from:  Patient Demographic factors:  Male, Living alone Current Mental Status:  NA Loss Factors:  Decline in physical health, Financial problems / change in socioeconomic status Historical Factors:  Family history of mental illness or substance abuse Risk Reduction Factors:  NA  Total Time spent with patient: 45 minutes Principal Problem: <principal problem not specified> Diagnosis:  Active Problems:   Schizoaffective disorder, bipolar type (HCC)  Subjective Data: Patient is contracting currently  Continued Clinical Symptoms:  Alcohol Use Disorder Identification Test Final Score (AUDIT): 11 The "Alcohol Use Disorders Identification Test", Guidelines for Use in Primary Care, Second Edition.  World Science writer [facility]). Score between 0-7:  no or low risk or alcohol related problems. Score between 8-15:  moderate risk of alcohol related problems. Score between 16-19:  high risk of alcohol related problems. Score 20 or above:  warrants further diagnostic evaluation for alcohol dependence and treatment.   CLINICAL FACTORS:   Schizophrenia:   Paranoid or undifferentiated type Musculoskeletal: Strength & Muscle Tone: within normal limits Gait & Station: normal Patient leans: N/A  Psychiatric Specialty Exam: Physical Exam  Nursing note and vitals reviewed. Constitutional: He appears well-developed and well-nourished.  Cardiovascular: Normal rate and regular rhythm.    Review of Systems  Constitutional: Negative.   Eyes: Negative.   Respiratory: Negative.   Endocrine: Negative.   Allergic/Immunologic: Negative.   Neurological: Negative.     Blood pressure (!) 86/55, pulse (!) 123, temperature 98 F (36.7 C), temperature source Oral, resp. rate 16, height 6' (1.829 m), weight 67.1 kg, SpO2 97 %.Body mass index is 20.07 kg/m.  General Appearance: Disheveled  Eye Contact:  Fair  Speech:  Normal Rate   Volume:  Decreased  Mood:  Anxious and Dysphoric  Affect:  Blunt and Congruent  Thought Process:  Goal Directed and Descriptions of Associations: Circumstantial  Orientation:  Full (Time, Place, and Person)  Thought Content:  Not appearing to respond to stimuli  Suicidal Thoughts:  No  Homicidal Thoughts:  No  Memory:  Recent;   Fair Remote;   Fair  Judgement:  Fair  Insight:  Fair  Psychomotor Activity:  Normal  Concentration:  Concentration: Fair and Attention Span: Good  Recall:  [employer] of Knowledge:  Fair  Language:  Fair  Akathisia:  Negative  Handed:  Left  AIMS (if indicated):     Assets:  Communication Skills Desire for Improvement  ADL's:  Intact  Cognition:  WNL  Sleep:  Number of Hours: 4.75     COGNITIVE FEATURES THAT CONTRIBUTE TO RISK:  Loss of executive function    SUICIDE RISK:   Minimal: No identifiable suicidal ideation.  Patients presenting with no risk factors but with morbid ruminations; may be classified as minimal risk based on the severity of the depressive symptoms  PLAN OF CARE: see eval  I certify that inpatient services furnished can reasonably be expected to improve the patient's condition.   Malvin Johns, MD 10/03/2019, 11:44 AM
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
## 2019-10-03 NOTE — Tx Team (Signed)
Initial Treatment Plan 10/03/2019 12:01 AM Derrick Cooper ZCH:[id number]    PATIENT STRESSORS: Health problems Marital or family conflict Medication change or noncompliance Substance abuse   PATIENT STRENGTHS: Capable of independent living General fund of knowledge Motivation for treatment/growth   PATIENT IDENTIFIED PROBLEMS: psychosis  Risk for SI  SA  "medications"  Medication non-compliance             DISCHARGE CRITERIA:  Adequate post-discharge living arrangements Improved stabilization in mood, thinking, and/or behavior Verbal commitment to aftercare and medication compliance  PRELIMINARY DISCHARGE PLAN: Attend aftercare/continuing care group Attend PHP/IOP Attend 12-step recovery group Placement in alternative living arrangements  PATIENT/FAMILY INVOLVEMENT: This treatment plan has been presented to and reviewed with the patient, Derrick Cooper.  The patient and family have been given the opportunity to ask questions and make suggestions.  Delos Haring, RN 10/03/2019, 12:01 AM
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
## 2019-10-03 NOTE — BHH Suicide Risk Assessment (Cosign Needed)
BHH INPATIENT:  Family/Significant Other Suicide Prevention Education  Suicide Prevention Education:  Patient Refusal for Family/Significant Other Suicide Prevention Education: The patient Derrick Cooper has refused to provide written consent for family/significant other to be provided Family/Significant Other Suicide Prevention Education during admission and/or prior to discharge.  Physician notified.  Reynold Bowen 10/03/2019, 10:24 AM

## 2019-10-03 NOTE — Progress Notes (Signed)
   10/03/19 1000  Psych Admission Type (Psych Patients Only)  Admission Status Voluntary  Psychosocial Assessment  Patient Complaints Anxiety;Depression  Eye Contact Fair  Facial Expression Anxious  Affect Anxious  Speech Logical/coherent  Interaction Assertive  Motor Activity Slow  Appearance/Hygiene Body odor;Disheveled  Behavior Characteristics Cooperative  Mood Anxious;Depressed  Aggressive Behavior  Targets Self;Family  Effect No apparent injury  Thought Process  Coherency WDL  Content WDL  Delusions WDL  Perception WDL  Hallucination None reported or observed;Auditory;Visual  Judgment Impaired  Confusion Mild  Danger to Self  Current suicidal ideation? Denies  Self-Injurious Behavior No self-injurious ideation or behavior indicators observed or expressed   Agreement Not to Harm Self No  Description of Agreement verbal commitment for safety  Danger to Others  Danger to Others Reported or observed  Danger to Others Abnormal  Harmful Behavior to others No threats or harm toward other people  Destructive Behavior No threats or harm toward property

## 2019-10-04 LAB — PROLACTIN: Prolactin: 12.2 ng/mL (ref 4.0–15.2)

## 2019-10-04 MED ORDER — QUETIAPINE FUMARATE 400 MG PO TABS: 400.0000 mg | ORAL_TABLET | Freq: Every day | ORAL | 1 refills | Status: DC

## 2019-10-04 MED ORDER — QUETIAPINE FUMARATE 400 MG PO TABS
400.0000 mg | ORAL_TABLET | Freq: Every day | ORAL | Status: DC
  Filled 2019-10-04: qty 7

## 2019-10-04 NOTE — Progress Notes (Signed)
Recreation Therapy Notes  Date: 3.5.21 Time: 1000 Location:  500 Hall Dayroom  Group Topic: Communication, Team Building, Problem Solving  Goal Area(s) Addresses:  Patient will effectively work with peer towards shared goal.  Patient will identify skills used to make activity successful.  Patient will identify how skills used during activity can be used to reach post d/c goals.   Behavioral Response: Minimal  Intervention: STEM Activity  Activity: Straw Bridge.  In groups, patients were given 15 straws and 2 ft of masking tape.  Using the supplies given, patients were to construct an elevated bridge that could hold a small puzzle box.  Education: Pharmacist, community, Discharge Planning   Education Outcome: Acknowledges education/In group clarification offered/Needs additional education.   Clinical Observations/Feedback:  Pt arrived late to group but jumped right into the activity.  Pt was working for about ten minutes before being called out of group to handle some things for discharge.   Derrick Cooper, LRT/CTRS         Derrick Cooper A 10/04/2019 11:33 AM

## 2019-10-04 NOTE — BHH Suicide Risk Assessment (Signed)
[facility] Discharge Suicide Risk Assessment   Principal Problem: Admitted due to homelessness and reports he was off of his quetiapine Discharge Diagnoses: Active Problems:   Schizoaffective disorder, bipolar type (HCC)   Total Time spent with patient: 45 minutes  Musculoskeletal: Strength & Muscle Tone: within normal limits Gait & Station: normal Patient leans: N/A  Psychiatric Specialty Exam: Physical Exam  Review of Systems  Blood pressure 126/86, pulse 93, temperature 98 F (36.7 C), temperature source Oral, resp. rate 16, height 6' (1.829 m), weight 67.1 kg, SpO2 97 %.Body mass index is 20.07 kg/m.  General Appearance: Casual  Eye Contact:  Good  Speech:  Normal Rate  Volume:  Normal  Mood:  Euthymic  Affect:  Congruent  Thought Process:  Coherent and Goal Directed  Orientation:  Full (Time, Place, and Person)  Thought Content:  Tangential  Suicidal Thoughts:  No  Homicidal Thoughts:  No  Memory:  Recent;   Fair Remote;   Fair  Judgement:  Fair  Insight:  Fair  Psychomotor Activity:  Normal  Concentration:  Concentration: Fair  Recall:  [employer] of Knowledge:  Fair  Language:  Fair  Akathisia:  Negative  Handed:  Right  AIMS (if indicated):     Assets:  Physical Health Resilience Social Support  ADL's:  Intact  Cognition:  WNL  Sleep:  Number of Hours: 5.75   Mental Status Per Nursing Assessment::   On Admission:  NA  Demographic Factors:  Unemployed  Loss Factors: Decrease in vocational status  Historical Factors: NA  Risk Reduction Factors:   Religious beliefs about death  Continued Clinical Symptoms:  Previous Psychiatric Diagnoses and Treatments  Cognitive Features That Contribute To Risk:  None    Suicide Risk:  Minimal: No identifiable suicidal ideation.  Patients presenting with no risk factors but with morbid ruminations; may be classified as minimal risk based on the severity of the depressive symptoms  Follow-up Information    [facility]. Go on 10/10/2019.   Why: You are scheduled for an appointment for medication management and therapy on 10/10/19 at 3:30 pm. Your appointment will be held in person.  Please have your insurance card and discharge summary available. Contact information: [address] [place] [address] [phone]        [facility]. Go on 10/22/2019.   Specialty: Infectious Diseases Why: You are scheduled for an appointment on 10/22/19 at 11:00 am.  This appointment will be held in person.  Contact information: [address] [place], [address] [id number] mc [place] [address] [phone]         Malvin Johns, MD 10/04/2019, 11:01 AM

## 2019-10-04 NOTE — Progress Notes (Signed)
  [facility] Adult Case Management Discharge Plan :  Will you be returning to the same living situation after discharge:  No. Provided shelter resources. At discharge, do you have transportation home?: Yes,  Safe Transportation/ Do you have the ability to pay for your medications: Yes,  Medicaid.  Release of information consent forms completed and in the chart.  Patient to Follow up at: Follow-up Information    [facility]. Go on 10/10/2019.   Why: You are scheduled for an appointment for medication management and therapy on 10/10/19 at 3:30 pm. Your appointment will be held in person.  Please have your insurance card and discharge summary available. Contact information: [address] [place] [address] [phone]        [facility] for [facility]. Go on 10/22/2019.   Specialty: Infectious Diseases Why: You are scheduled for an appointment on 10/22/19 at 11:00 am.  This appointment will be held in person.  Contact information: [address] [place], [address] [place] [id number] mc [place] [address] [phone]          Next level of care provider has access to [facility] Link:Yes  Safety Planning and Suicide Prevention discussed: Yes,  with patient.  Have you used any form of tobacco in the last 30 days? (Cigarettes, Smokeless Tobacco, Cigars, and/or Pipes): Yes  Has patient been referred to the Quitline?: Patient refused referral  Patient has been referred for addiction treatment: Yes  Darreld Mclean, LCSWA 10/04/2019, 11:18 AM

## 2019-10-04 NOTE — Progress Notes (Signed)
Pt discharged to lobby. Pt was stable and appreciative at that time. All papers, samples and prescriptions were given and valuables returned. Verbal understanding expressed. Denies SI/HI and A/VH. Pt given opportunity to express concerns and ask questions.  

## 2019-10-04 NOTE — Plan of Care (Signed)
Pt was able to identify some triggers to unhealthy behaviors at completion of recreation therapy group sessions.   Caroll Rancher, LRT/CTRS

## 2019-10-04 NOTE — Progress Notes (Signed)
   10/04/19 0043  Psych Admission Type (Psych Patients Only)  Admission Status Voluntary  Psychosocial Assessment  Patient Complaints Anxiety  Eye Contact Fair  Facial Expression Anxious  Affect Anxious  Speech Logical/coherent  Interaction Assertive  Motor Activity Slow  Appearance/Hygiene Body odor;Disheveled  Behavior Characteristics Cooperative;Calm  Mood Pleasant  Aggressive Behavior  Targets Self;Family  Effect No apparent injury  Thought Process  Coherency WDL  Content WDL  Delusions WDL  Perception WDL  Hallucination None reported or observed;Auditory;Visual  Judgment Impaired  Confusion Mild  Danger to Self  Current suicidal ideation? Denies  Self-Injurious Behavior No self-injurious ideation or behavior indicators observed or expressed   Agreement Not to Harm Self No  Description of Agreement verbal commitment for safety  Danger to Others  Danger to Others Reported or observed  Danger to Others Abnormal  Harmful Behavior to others No threats or harm toward other people  Destructive Behavior No threats or harm toward property

## 2019-10-04 NOTE — Tx Team (Signed)
Interdisciplinary Treatment and Diagnostic Plan Update  10/04/2019 Time of Session: 10:15am Derrick Cooper MRN: [id number]  Principal Diagnosis: <principal problem not specified>  Secondary Diagnoses: Active Problems:   Schizoaffective disorder, bipolar type ([place])   Current Medications:  Current Facility-Administered Medications  Medication Dose Route Frequency Provider Last Rate Last Admin  . QUEtiapine (SEROQUEL) tablet 100 mg  100 mg Oral QHS Johnn Hai, MD   100 mg at 10/03/19 2154  . QUEtiapine (SEROQUEL) tablet 25 mg  25 mg Oral BID Johnn Hai, MD   25 mg at 10/04/19 0815  . traZODone (DESYREL) tablet 50 mg  50 mg Oral QHS PRN Mordecai Maes, NP   50 mg at 10/02/19 2326   PTA Medications: Medications Prior to Admission  Medication Sig Dispense Refill Last Dose  . ARIPiprazole ER (ABILIFY MAINTENA) 400 MG PRSY prefilled syringe Inject into the muscle.     Marland Kitchen atorvastatin (LIPITOR) 40 MG tablet Take by mouth.     . benztropine (COGENTIN) 0.5 MG tablet Take 1 tablet (0.5 mg total) by mouth 2 (two) times daily. 60 tablet 0   . BIKTARVY 50-200-25 MG TABS tablet TAKE 1 TABLET BY MOUTH DAILY. 30 tablet 4   . lisinopril (ZESTRIL) 5 MG tablet Take 5 mg by mouth daily.     . metoprolol succinate (TOPROL-XL) 50 MG 24 hr tablet Take 1 tablet (50 mg total) by mouth daily. Take with or immediately following a meal. 30 tablet 0   . QUEtiapine (SEROQUEL) 400 MG tablet Take 400 mg by mouth at bedtime.       Patient Stressors: Health problems Marital or family conflict Medication change or noncompliance Substance abuse  Patient Strengths: Capable of independent living General fund of knowledge Motivation for treatment/growth  Treatment Modalities: Medication Management, Group therapy, Case management,  1 to 1 session with clinician, Psychoeducation, Recreational therapy.   Physician Treatment Plan for Primary Diagnosis: <principal problem not specified> Long Term Goal(s):  Improvement in symptoms so as ready for discharge Improvement in symptoms so as ready for discharge   Short Term Goals: Ability to identify changes in lifestyle to reduce recurrence of condition will improve Ability to verbalize feelings will improve Ability to identify and develop effective coping behaviors will improve Compliance with prescribed medications will improve Ability to disclose and discuss suicidal ideas Ability to demonstrate self-control will improve  Medication Management: Evaluate patient's response, side effects, and tolerance of medication regimen.  Therapeutic Interventions: 1 to 1 sessions, Unit Group sessions and Medication administration.  Evaluation of Outcomes: Progressing  Physician Treatment Plan for Secondary Diagnosis: Active Problems:   Schizoaffective disorder, bipolar type ([place])  Long Term Goal(s): Improvement in symptoms so as ready for discharge Improvement in symptoms so as ready for discharge   Short Term Goals: Ability to identify changes in lifestyle to reduce recurrence of condition will improve Ability to verbalize feelings will improve Ability to identify and develop effective coping behaviors will improve Compliance with prescribed medications will improve Ability to disclose and discuss suicidal ideas Ability to demonstrate self-control will improve     Medication Management: Evaluate patient's response, side effects, and tolerance of medication regimen.  Therapeutic Interventions: 1 to 1 sessions, Unit Group sessions and Medication administration.  Evaluation of Outcomes: Progressing   RN Treatment Plan for Primary Diagnosis: <principal problem not specified> Long Term Goal(s): Knowledge of disease and therapeutic regimen to maintain health will improve  Short Term Goals: Ability to participate in decision making will improve, Ability to verbalize  feelings will improve, Ability to disclose and discuss suicidal ideas, Ability to  identify and develop effective coping behaviors will improve and Compliance with prescribed medications will improve  Medication Management: RN will administer medications as ordered by provider, will assess and evaluate patient's response and provide education to patient for prescribed medication. RN will report any adverse and/or side effects to prescribing provider.  Therapeutic Interventions: 1 on 1 counseling sessions, Psychoeducation, Medication administration, Evaluate responses to treatment, Monitor vital signs and CBGs as ordered, Perform/monitor CIWA, COWS, AIMS and Fall Risk screenings as ordered, Perform wound care treatments as ordered.  Evaluation of Outcomes: Progressing   LCSW Treatment Plan for Primary Diagnosis: <principal problem not specified> Long Term Goal(s): Safe transition to appropriate next level of care at discharge, Engage patient in therapeutic group addressing interpersonal concerns.  Short Term Goals: Engage patient in aftercare planning with referrals and resources and Increase skills for wellness and recovery  Therapeutic Interventions: Assess for all discharge needs, 1 to 1 time with Social worker, Explore available resources and support systems, Assess for adequacy in community support network, Educate family and significant other(s) on suicide prevention, Complete Psychosocial Assessment, Interpersonal group therapy.  Evaluation of Outcomes: Progressing   Progress in Treatment: Attending groups: Yes. Participating in groups: Yes. Taking medication as prescribed: Yes. Toleration medication: Yes. Family/Significant other contact made: Yes, individual(s) contacted:  with pt Patient understands diagnosis: Yes. Discussing patient identified problems/goals with staff: Yes. Medical problems stabilized or resolved: Yes. Denies suicidal/homicidal ideation: Yes. Issues/concerns per patient self-inventory: No. Other:   New problem(s) identified: No, Describe:   none  New Short Term/Long Term Goal(s): Medication stabilization, elimination of SI thoughts, and development of a comprehensive mental wellness plan.   Patient Goals:  "Get back on my meds"  Discharge Plan or Barriers: CSW will continue to follow up for appropriate referrals and possible discharge planning  Reason for Continuation of Hospitalization: Delusions  Hallucinations Mania Medication stabilization  Estimated Length of Stay: 3-5 days  Attendees: Patient: Derrick Cooper 10/04/2019   Physician: Dr. Jeannine Kitten, MD 10/04/2019   Nursing:  10/04/2019   RN Care Manager: 10/04/2019   Social Worker: Enid Cutter, [place] 10/04/2019   Recreational Therapist:  10/04/2019   Other: Earlyne Iba, MSW intern  10/04/2019   Other: Rodell Perna, RN 10/04/2019   Other: 10/04/2019    Scribe for Treatment Team: Reynold Bowen, Student-Social Work 10/04/2019 10:54 AM

## 2019-10-04 NOTE — BHH Counselor (Addendum)
Update 11:00am- Patient able to speak with his Case Worker and reinstate his SSI benefits. Patient voiced appreciation.  Patient requested assistance with re-certifying his Social Security Income. CSW was able to identify patient's SSI Case Worker (Ms.Dennis). CSW left a detailed voicemail to assist patient in completing his re-certification screening. Patient was provided printed contact information and updated.  Enid Cutter, MSW, LCSW-A Clinical Social Worker [facility]  [phone]

## 2019-10-04 NOTE — Progress Notes (Signed)
Recreation Therapy Notes  INPATIENT RECREATION TR PLAN  Patient Details Name: Derrick Cooper MRN: [id number] DOB: Apr 06, 1961 Today's Date: 10/04/2019  Rec Therapy Plan Is patient appropriate for Therapeutic Recreation?: Yes Treatment times per week: about 3 days Estimated Length of Stay: 5-7 days TR Treatment/Interventions: Group participation (Comment)  Discharge Criteria Pt will be discharged from therapy if:: Discharged Treatment plan/goals/alternatives discussed and agreed upon by:: Patient/family  Discharge Summary Short term goals set: See patient care plan Short term goals met: Adequate for discharge Progress toward goals comments: Groups attended Which groups?: Self-esteem, Other (Comment)(Team building) Reason goals not met: None Therapeutic equipment acquired: N/A Reason patient discharged from therapy: Discharge from hospital Pt/family agrees with progress & goals achieved: Yes Date patient discharged from therapy: 10/04/19    Victorino Sparrow, LRT/CTRS  Ria Comment, Tate Jerkins A 10/04/2019, 11:58 AM
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
## 2019-10-11 ENCOUNTER — Other Ambulatory Visit: Payer: Self-pay | Admitting: *Deleted

## 2019-10-11 DIAGNOSIS — Z20822 Contact with and (suspected) exposure to covid-19: Secondary | ICD-10-CM

## 2019-10-12 LAB — NOVEL CORONAVIRUS, NAA: SARS-CoV-2, NAA: NOT DETECTED

## 2019-10-22 ENCOUNTER — Ambulatory Visit: Payer: Medicaid Other | Admitting: Family

## 2019-10-23 ENCOUNTER — Ambulatory Visit (HOSPITAL_COMMUNITY)
Admission: AD | Admit: 2019-10-23 | Discharge: 2019-10-23 | Disposition: A | Discharge status: Home / Self Care | Payer: Medicaid Other | Attending: Psychiatry | Admitting: Psychiatry

## 2019-10-23 ENCOUNTER — Encounter (HOSPITAL_COMMUNITY): Payer: Self-pay

## 2019-10-23 ENCOUNTER — Emergency Department (HOSPITAL_COMMUNITY)
Admission: EM | Admit: 2019-10-23 | Discharge: 2019-10-24 | Disposition: A | Discharge status: Other Acute Inpatient Hospital | Payer: Medicaid Other | Attending: Emergency Medicine | Admitting: Emergency Medicine

## 2019-10-23 DIAGNOSIS — F10129 Alcohol abuse with intoxication, unspecified: Secondary | ICD-10-CM | POA: Insufficient documentation

## 2019-10-23 DIAGNOSIS — B20 Human immunodeficiency virus [HIV] disease: Secondary | ICD-10-CM | POA: Insufficient documentation

## 2019-10-23 DIAGNOSIS — Y908 Blood alcohol level of 240 mg/100 ml or more: Secondary | ICD-10-CM | POA: Insufficient documentation

## 2019-10-23 DIAGNOSIS — Z20822 Contact with and (suspected) exposure to covid-19: Secondary | ICD-10-CM | POA: Insufficient documentation

## 2019-10-23 DIAGNOSIS — F209 Schizophrenia, unspecified: Secondary | ICD-10-CM | POA: Diagnosis present

## 2019-10-23 DIAGNOSIS — Z79899 Other long term (current) drug therapy: Secondary | ICD-10-CM | POA: Insufficient documentation

## 2019-10-23 DIAGNOSIS — F29 Unspecified psychosis not due to a substance or known physiological condition: Secondary | ICD-10-CM | POA: Insufficient documentation

## 2019-10-23 DIAGNOSIS — Z7984 Long term (current) use of oral hypoglycemic drugs: Secondary | ICD-10-CM | POA: Insufficient documentation

## 2019-10-23 DIAGNOSIS — E119 Type 2 diabetes mellitus without complications: Secondary | ICD-10-CM | POA: Insufficient documentation

## 2019-10-23 DIAGNOSIS — F200 Paranoid schizophrenia: Secondary | ICD-10-CM

## 2019-10-23 LAB — URINALYSIS, COMPLETE (UACMP) WITH MICROSCOPIC
Bilirubin Urine: NEGATIVE
Glucose, UA: NEGATIVE mg/dL
Ketones, ur: NEGATIVE mg/dL
Nitrite: POSITIVE — AB
Protein, ur: 100 mg/dL — AB
Specific Gravity, Urine: 1.013 (ref 1.005–1.030)
pH: 5 (ref 5.0–8.0)

## 2019-10-23 LAB — CBC WITH DIFFERENTIAL/PLATELET
Abs Immature Granulocytes: 0.02 10*3/uL (ref 0.00–0.07)
Basophils Absolute: 0 10*3/uL (ref 0.0–0.1)
Basophils Relative: 0 %
Eosinophils Absolute: 0 10*3/uL (ref 0.0–0.5)
Eosinophils Relative: 0 %
HCT: 43.8 % (ref 39.0–52.0)
Hemoglobin: 14.6 g/dL (ref 13.0–17.0)
Immature Granulocytes: 0 %
Lymphocytes Relative: 31 %
Lymphs Abs: 2.2 10*3/uL (ref 0.7–4.0)
MCH: 30.6 pg (ref 26.0–34.0)
MCHC: 33.3 g/dL (ref 30.0–36.0)
MCV: 91.8 fL (ref 80.0–100.0)
Monocytes Absolute: 0.6 10*3/uL (ref 0.1–1.0)
Monocytes Relative: 8 %
Neutro Abs: 4.2 10*3/uL (ref 1.7–7.7)
Neutrophils Relative %: 61 %
Platelets: 122 10*3/uL — ABNORMAL LOW (ref 150–400)
RBC: 4.77 MIL/uL (ref 4.22–5.81)
RDW: 14.7 % (ref 11.5–15.5)
WBC: 7.1 10*3/uL (ref 4.0–10.5)
nRBC: 0 % (ref 0.0–0.2)

## 2019-10-23 LAB — COMPREHENSIVE METABOLIC PANEL
ALT: 96 U/L — ABNORMAL HIGH (ref 0–44)
AST: 107 U/L — ABNORMAL HIGH (ref 15–41)
Albumin: 4.1 g/dL (ref 3.5–5.0)
Alkaline Phosphatase: 65 U/L (ref 38–126)
Anion gap: 13 (ref 5–15)
BUN: 10 mg/dL (ref 6–20)
CO2: 25 mmol/L (ref 22–32)
Calcium: 8.5 mg/dL — ABNORMAL LOW (ref 8.9–10.3)
Chloride: 105 mmol/L (ref 98–111)
Creatinine, Ser: 0.73 mg/dL (ref 0.61–1.24)
GFR calc Af Amer: >60 mL/min (ref >60)
GFR calc non Af Amer: >60 mL/min (ref >60)
Glucose, Bld: 71 mg/dL (ref 70–99)
Potassium: 3.7 mmol/L (ref 3.5–5.1)
Sodium: 143 mmol/L (ref 135–145)
Total Bilirubin: 0.5 mg/dL (ref 0.3–1.2)
Total Protein: 8.4 g/dL — ABNORMAL HIGH (ref 6.5–8.1)

## 2019-10-23 LAB — LIPID PANEL
Cholesterol: 197 mg/dL (ref 0–200)
HDL: 89 mg/dL (ref >40)
LDL Cholesterol: 100 mg/dL — ABNORMAL HIGH (ref 0–99)
Total CHOL/HDL Ratio: 2.2 RATIO
Triglycerides: 41 mg/dL (ref <150)
VLDL: 8 mg/dL (ref 0–40)

## 2019-10-23 LAB — HEMOGLOBIN A1C
Hgb A1c MFr Bld: 7.1 % — ABNORMAL HIGH (ref 4.8–5.6)
Mean Plasma Glucose: 157.07 mg/dL

## 2019-10-23 LAB — RESPIRATORY PANEL BY RT PCR (FLU A&B, COVID)
Influenza A by PCR: NEGATIVE
Influenza B by PCR: NEGATIVE
SARS Coronavirus 2 by RT PCR: NEGATIVE

## 2019-10-23 LAB — RAPID URINE DRUG SCREEN, HOSP PERFORMED
Amphetamines: NOT DETECTED
Barbiturates: NOT DETECTED
Benzodiazepines: POSITIVE — AB
Cocaine: NOT DETECTED
Opiates: NOT DETECTED
Tetrahydrocannabinol: POSITIVE — AB

## 2019-10-23 LAB — ETHANOL: Alcohol, Ethyl (B): 276 mg/dL — ABNORMAL HIGH (ref <10)

## 2019-10-23 MED ORDER — HALOPERIDOL LACTATE 5 MG/ML IJ SOLN
10.0000 mg | Freq: Once | INTRAMUSCULAR | Status: AC
  Administered 2019-10-23: 10 mg via INTRAMUSCULAR
  Filled 2019-10-23: qty 2

## 2019-10-23 MED ORDER — LORAZEPAM 2 MG/ML IJ SOLN
1.0000 mg | Freq: Once | INTRAMUSCULAR | Status: AC
  Administered 2019-10-23: 19:00:00 1 mg via INTRAMUSCULAR
  Filled 2019-10-23: qty 1

## 2019-10-23 NOTE — ED Notes (Signed)
Pt is sleeping

## 2019-10-23 NOTE — ED Notes (Signed)
Pt ambulatory to and from bathroom with standby assistance. Pt able to provide urine sample.  Pt sitting on edge of bed eating meal tray. Pt has no complaints at this time.

## 2019-10-23 NOTE — ED Notes (Signed)
ED TO INPATIENT HANDOFF REPORT  Name/Age/Gender Derrick Cooper 59 y.o. male  Code Status    Code Status Orders  (From admission, onward)         Start     Ordered   10/23/19 1831  Full code  Continuous     10/23/19 1831        Code Status History    Date Active Date Inactive Code Status Order ID Comments User Context   10/02/2019 2256 10/04/2019 1747 Full Code [id number]  Denzil Magnuson, NP Inpatient   08/09/2018 0455 08/11/2018 1427 Full Code [id number]  Clifton Custard Inpatient   08/09/2018 0148 08/09/2018 0205 Full Code [id number]  Kerry Hough, PA-C ED   07/11/2018 1649 07/17/2018 1851 Full Code [id number]  Maryfrances Bunnell, FNP Inpatient   07/09/2018 1400 07/11/2018 1610 Full Code [id number]  Samuel Jester, DO ED   Advance Care Planning Activity      Home/SNF/Other Home  Chief Complaint SI/IVC  Level of Care/Admitting Diagnosis ED Disposition    None      Medical History Past Medical History:  Diagnosis Date  . Diabetes mellitus without complication (HCC)   . Hepatitis C   . HIV infection (HCC)   . Schizophrenia (HCC)   . Substance abuse (HCC)     Allergies No Known Allergies  IV Location/Drains/Wounds Patient Lines/Drains/Airways Status   Active Line/Drains/Airways    None          Labs/Imaging Results for orders placed or performed during the hospital encounter of 10/23/19 (from the past 48 hour(s))  Respiratory Panel by RT PCR (Flu A&B, Covid) - Nasopharyngeal Swab     Status: None   Collection Time: 10/23/19  6:59 PM   Specimen: Nasopharyngeal Swab  Result Value Ref Range   SARS Coronavirus 2 by RT PCR NEGATIVE NEGATIVE    Comment: (NOTE) SARS-CoV-2 target nucleic acids are NOT DETECTED. The SARS-CoV-2 RNA is generally detectable in upper respiratoy specimens during the acute phase of infection. The lowest concentration of SARS-CoV-2 viral copies this assay can detect is 131 copies/mL. A negative result does not preclude  SARS-Cov-2 infection and should not be used as the sole basis for treatment or other patient management decisions. A negative result may occur with  improper specimen collection/handling, submission of specimen other than nasopharyngeal swab, presence of viral mutation(s) within the areas targeted by this assay, and inadequate number of viral copies (<131 copies/mL). A negative result must be combined with clinical observations, patient history, and epidemiological information. The expected result is Negative. Fact Sheet for Patients:  https://www.moore.com/ Fact Sheet for Healthcare Providers:  https://www.young.biz/ This test is not yet ap proved or cleared by the [place] FDA and  has been authorized for detection and/or diagnosis of SARS-CoV-2 by FDA under an Emergency Use Authorization (EUA). This EUA will remain  in effect (meaning this test can be used) for the duration of the COVID-19 declaration under Section 564(b)(1) of the Act, 21 U.S.C. section 360bbb-3(b)(1), unless the authorization is terminated or revoked sooner.    Influenza A by PCR NEGATIVE NEGATIVE   Influenza B by PCR NEGATIVE NEGATIVE    Comment: (NOTE) The Xpert Xpress SARS-CoV-2/FLU/RSV assay is intended as an aid in  the diagnosis of influenza from Nasopharyngeal swab specimens and  should not be used as a sole basis for treatment. Nasal washings and  aspirates are unacceptable for Xpert Xpress SARS-CoV-2/FLU/RSV  testing. Fact Sheet for Patients: https://www.moore.com/ Fact Sheet for Healthcare Providers:  https://www.young.biz/ This test is not yet approved or cleared by the [place] and  has been authorized for detection and/or diagnosis of SARS-CoV-2 by  FDA under an Emergency Use Authorization (EUA). This EUA will remain  in effect (meaning this test can be used) for the duration of the  Covid-19 declaration  under Section 564(b)(1) of the Act, 21  U.S.C. section 360bbb-3(b)(1), unless the authorization is  terminated or revoked. Performed at [facility], [address], [place], [place] [address]   Comprehensive metabolic panel     Status: Abnormal   Collection Time: 10/23/19  7:10 PM  Result Value Ref Range   Sodium 143 135 - 145 mmol/L   Potassium 3.7 3.5 - 5.1 mmol/L   Chloride 105 98 - 111 mmol/L   CO2 25 22 - 32 mmol/L   Glucose, Bld 71 70 - 99 mg/dL    Comment: Glucose reference range applies only to samples taken after fasting for at least 8 hours.   BUN 10 6 - 20 mg/dL   Creatinine, Ser 2.68 0.61 - 1.24 mg/dL   Calcium 8.5 (L) 8.9 - 10.3 mg/dL   Total Protein 8.4 (H) 6.5 - 8.1 g/dL   Albumin 4.1 3.5 - 5.0 g/dL   AST 341 (H) 15 - 41 U/L   ALT 96 (H) 0 - 44 U/L   Alkaline Phosphatase 65 38 - 126 U/L   Total Bilirubin 0.5 0.3 - 1.2 mg/dL   GFR calc non Af Amer >60 >60 mL/min   GFR calc Af Amer >60 >60 mL/min   Anion gap 13 5 - 15    Comment: Performed at [facility], [address], [place], [place] [address]  Ethanol     Status: Abnormal   Collection Time: 10/23/19  7:10 PM  Result Value Ref Range   Alcohol, Ethyl (B) 276 (H) <10 mg/dL    Comment: (NOTE) Lowest detectable limit for serum alcohol is 10 mg/dL. For medical purposes only. Performed at [facility], [address] [place], [place] [address]   CBC with Diff     Status: Abnormal   Collection Time: 10/23/19  7:10 PM  Result Value Ref Range   WBC 7.1 4.0 - 10.5 K/uL   RBC 4.77 4.22 - 5.81 MIL/uL   Hemoglobin 14.6 13.0 - 17.0 g/dL   HCT 21.1 94.1 - 74.0 %   MCV 91.8 80.0 - 100.0 fL   MCH 30.6 26.0 - 34.0 pg   MCHC 33.3 30.0 - 36.0 g/dL   RDW 81.4 48.1 - 85.6 %   Platelets 122 (L) 150 - 400 K/uL   nRBC 0.0 0.0 - 0.2 %   Neutrophils Relative % 61 %   Neutro Abs 4.2 1.7 - 7.7 K/uL   Lymphocytes Relative 31 %   Lymphs Abs 2.2 0.7 - 4.0 K/uL    Monocytes Relative 8 %   Monocytes Absolute 0.6 0.1 - 1.0 K/uL   Eosinophils Relative 0 %   Eosinophils Absolute 0.0 0.0 - 0.5 K/uL   Basophils Relative 0 %   Basophils Absolute 0.0 0.0 - 0.1 K/uL   Immature Granulocytes 0 %   Abs Immature Granulocytes 0.02 0.00 - 0.07 K/uL    Comment: Performed at [facility], [address] [place], [place] [address]  Lipid panel     Status: Abnormal   Collection Time: 10/23/19  7:10 PM  Result Value Ref Range   Cholesterol 197 0 -  200 mg/dL   Triglycerides 41 <150 mg/dL   HDL 89 >40 mg/dL   Total CHOL/HDL Ratio 2.2 RATIO   VLDL 8 0 - 40 mg/dL   LDL Cholesterol 100 (H) 0 - 99 mg/dL    Comment:        Total Cholesterol/HDL:CHD Risk Coronary Heart Disease Risk Table                     Men   Women  1/2 Average Risk   3.4   3.3  Average Risk       5.0   4.4  2 X Average Risk   9.6   7.1  3 X Average Risk  23.4   11.0        Use the calculated Patient Ratio above and the CHD Risk Table to determine the patient's CHD Risk.        ATP III CLASSIFICATION (LDL):  <100     mg/dL   Optimal  100-129  mg/dL   Near or Above                    Optimal  130-159  mg/dL   Borderline  160-189  mg/dL   High  >190     mg/dL   Very High Performed at [facility] [address] [place], [place] [address]   Hemoglobin A1c     Status: Abnormal   Collection Time: 10/23/19  7:10 PM  Result Value Ref Range   Hgb A1c MFr Bld 7.1 (H) 4.8 - 5.6 %    Comment: (NOTE) Pre diabetes:          5.7%-6.4% Diabetes:              >6.4% Glycemic control for   <7.0% adults with diabetes    Mean Plasma Glucose 157.07 mg/dL    Comment: Performed at [facility] [address] [place], [place] [address]  Urinalysis, Complete w Microscopic     Status: Abnormal   Collection Time: 10/23/19 11:16 PM  Result Value Ref Range   Color, Urine YELLOW YELLOW   APPearance HAZY (A) CLEAR   Specific Gravity, Urine 1.013 1.005 - 1.030    pH 5.0 5.0 - 8.0   Glucose, UA NEGATIVE NEGATIVE mg/dL   Hgb urine dipstick MODERATE (A) NEGATIVE   Bilirubin Urine NEGATIVE NEGATIVE   Ketones, ur NEGATIVE NEGATIVE mg/dL   Protein, ur 100 (A) NEGATIVE mg/dL   Nitrite POSITIVE (A) NEGATIVE   Leukocytes,Ua MODERATE (A) NEGATIVE   RBC / HPF 0-5 0 - 5 RBC/hpf   WBC, UA 21-50 0 - 5 WBC/hpf   Bacteria, UA MANY (A) NONE SEEN   Mucus PRESENT    Hyaline Casts, UA PRESENT     Comment: Performed at [facility], [place] [address], [place], [place] [address]   No results found.  Pending Labs Unresulted Labs (From admission, onward)    Start     Ordered   10/23/19 1831  Urine rapid drug screen (hosp performed)  ONCE - STAT,   STAT     10/23/19 1830          Vitals/Pain Today's Vitals   10/23/19 1931 10/23/19 2114 10/23/19 2155 10/23/19 2235  BP:  (!) 151/95 (!) 142/99 130/84  Pulse:  98 98 95  Resp:  20 20 20   Temp:      TempSrc:      SpO2:  95% 94%  95%  PainSc: 0-No pain Asleep Asleep Asleep    Isolation Precautions No active isolations  Medications Medications  haloperidol lactate (HALDOL) injection 10 mg (10 mg Intramuscular Given 10/23/19 1839)  LORazepam (ATIVAN) injection 1 mg (1 mg Intramuscular Given 10/23/19 1839)    Mobility walks

## 2019-10-23 NOTE — ED Provider Notes (Signed)
[facility]-EMERGENCY DEPT Provider Note   CSN: [id number] Arrival date & time: 10/23/19  1821     History No chief complaint on file.   Derrick Cooper is a 59 y.o. male brought into the emergency department under involuntary commitment from the behavioral health center for acute psychosis and hallucinations.  He has a history of schizophrenia, alcohol abuse, HIV, hepatitis C, diabetes and polysubstance abuse.  His acute care clinician part of the [employer] response team states that he knows him well.  The patient has been drinking about 440 ounces daily for many years.  He last used crack cocaine about 4 days ago.  Patient has been taking Seroquel which she was given after he was hospitalized last time however when his Seroquel run out he began becoming terrified that vampires were going to bite him and he is seeing the vampires and bugs everywhere.  Patient arrives with [employer] screaming and terrified and begging people not to leave him alone or let the vampires bite him.  HPI     Past Medical History:  Diagnosis Date  . Diabetes mellitus without complication (HCC)   . Hepatitis C   . HIV infection (HCC)   . Schizophrenia (HCC)   . Substance abuse [facility])     Patient Active Problem List   Diagnosis Date Noted  . MDD (major depressive disorder), recurrent episode, severe (HCC) 08/09/2018  . Schizoaffective disorder, bipolar type (HCC)   . Health care maintenance 06/26/2018  . Tobacco use 06/26/2018  . Furuncle of right axilla 11/13/2017  . Hyperlipidemia associated with type 2 diabetes mellitus (HCC) 10/25/2017  . 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 HIV disease (HCC) 09/27/2017  . Chronic hepatitis C without hepatic coma (HCC) 09/27/2017  . Acute non-recurrent maxillary sinusitis 09/27/2017  . Schizophrenia (HCC) 09/07/2017  . Uncontrolled type 2 diabetes mellitus with  microalbuminuria, with long-term current use of insulin (HCC) 09/07/2017  . Urinary incontinence 09/07/2017    No past surgical history on file.     Family History  Problem Relation Age of Onset  . Breast cancer Mother   . Lung cancer Father   . Heart attack Father     Social History   Tobacco Use  . Smoking status: Current Every Day Smoker    Packs/day: 0.50    Years: 40.00    Pack years: 20.00    Types: Cigarettes  . Smokeless tobacco: Never Used  Substance Use Topics  . Alcohol use: Yes  . Drug use: Yes    Types: Marijuana, Methamphetamines    Comment: has not used meth since 03/2017, marijuana once or twice a month    Home Medications Prior to Admission medications   Medication Sig Start Date End Date Taking? Authorizing Provider  ARIPiprazole ER (ABILIFY MAINTENA) 400 MG PRSY prefilled syringe Inject into the muscle.    [provider]  atorvastatin (LIPITOR) 40 MG tablet Take by mouth. 06/04/19   [provider]  BIKTARVY 50-200-25 MG TABS tablet TAKE 1 TABLET BY MOUTH DAILY. 07/11/19   Veryl Speak, FNP  QUEtiapine (SEROQUEL) 400 MG tablet Take 1 tablet (400 mg total) by mouth at bedtime. 10/04/19   Malvin Johns, MD    Allergies    Patient has no known allergies.  Review of Systems   Review of Systems Ten systems reviewed and are negative for acute change, except as noted in the HPI.  Physical Exam Updated Vital Signs There were no vitals taken for this visit.  Physical Exam Vitals and nursing note reviewed.  Constitutional:      Comments: Wearing an eye patch on the left Thin, disheveled, unkempt, appears chronically ill  HENT:     Head: Normocephalic.  Eyes:     Conjunctiva/sclera: Conjunctivae normal.  Cardiovascular:     Rate and Rhythm: Tachycardia present.  Pulmonary:     Effort: Pulmonary effort is normal.  Abdominal:     General: Abdomen is flat.  Skin:    General: Skin is warm.  Neurological:     Mental Status:  He is alert.  Psychiatric:        Attention and Perception: He perceives auditory and visual hallucinations.        Mood and Affect: Mood is anxious. Affect is inappropriate.        Speech: Speech is tangential.        Behavior: Behavior is agitated.        Thought Content: Thought content is paranoid and delusional.        Cognition and Memory: Cognition is impaired.        Judgment: Judgment is impulsive.     ED Results / Procedures / Treatments   Labs (all labs ordered are listed, but only abnormal results are displayed) Labs Reviewed - No data to display  EKG EKG Interpretation  Date/Time:  Wednesday October 23 2019 18:53:28 EDT Ventricular Rate:  107 PR Interval:    QRS Duration: 90 QT Interval:  328 QTC Calculation: 438 R Axis:   61 Text Interpretation: Sinus tachycardia Probable left atrial enlargement Anteroseptal infarct, old No significant change since last tracing Confirmed by Fredia Sorrow [id number]) on 10/23/2019 7:17:14 PM   Radiology No results found.  Procedures Procedures (including critical care time)  Medications Ordered in ED Medications - No data to display  ED Course  I have reviewed the triage vital signs and the nursing notes.  Pertinent labs & imaging results that were available during my care of the patient were reviewed by me and considered in my medical decision making (see chart for details).    MDM Rules/Calculators/A&P                     59 year old male with schizophrenia, HIV and diabetes under involuntary commitment. He is obviously psychotic and will need psychiatric admission. Patient's ethanol level is at 276. Covid test negative. Negative for influenza. Mild thrombocytopenia. Elevated AST and ALT in the setting of chronic alcohol abuse. Patient does not have a leukocytosis. EKG shows tachycardia at a rate of 107 without QT prolongation. Due to patient's alcohol level and sedation after calming medications he will need to improve some  before evaluation. I discussed the case with PA Rona Ravens who will assume care. Final Clinical Impression(s) / ED Diagnoses Final diagnoses:  None    Rx / DC Orders ED Discharge Orders    None       Margarita Mail, PA-C 10/23/19 2106    Fredia Sorrow, MD 10/24/19 0020

## 2019-10-23 NOTE — ED Notes (Signed)
[employer] called for police transport to [facility].

## 2019-10-23 NOTE — ED Notes (Signed)
Pt running out of room yelling, "they are after me, the vampires are coming! Help me they are coming to get me." Pt was redirected by [facility] Behavioral Health Clinician and [employer].

## 2019-10-23 NOTE — ED Triage Notes (Signed)
Pt BIB [employer]/Behavioral Health Clinician. Pt was sent to [facility], however, due to possibly going through Detox, pt was brought to ED. Pt has hx of schizophrenia, ETOH and drug abuse. Per PD, pt told them he drinks 3-4 40oz beers daily, Used cocaine x2 days ago. Additionally, pt has not been taking medications for 2 weeks. Pt is having visual/auditory hallucinations including vampires and bugs. Pt has been IVC'ed by [employer] due to having suicidal thoughts - plan to shot himself or have an officer to shoot him.

## 2019-10-23 NOTE — Progress Notes (Signed)
Patient ID: Derrick Cooper, male   DOB: 05/17/1961, 59 y.o.   MRN: [id number]   Patient accepted to [facility] 500/1 once bed medically cleared.

## 2019-10-23 NOTE — Progress Notes (Signed)
Pt accepted to [facility]; 500-01.   Shuvon Rankin, NP is the accepting provider.    Dr. Jeannine Kitten is the attending provider.    Call report to [phone].     Kaitlyn @ [facility] ED notified.     Pt is IVC.    Pt may be transported by [employer].   Pt scheduled to arrive pending negative COVID screen. Please call the unit to coordinate admission time.   Drucilla Schmidt, MSW, LCSW-A Clinical Disposition Social Worker [facility]/TTS [phone]

## 2019-10-23 NOTE — ED Notes (Signed)
Pt can go to [facility] 500-01 once cleared to go.

## 2019-10-23 NOTE — ED Notes (Addendum)
Belongings and suitcase taken by security and placed in SAPU day room. Pt changed into purple scrubs.
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
## 2019-10-24 ENCOUNTER — Encounter (HOSPITAL_COMMUNITY): Payer: Self-pay | Admitting: Psychiatry

## 2019-10-24 ENCOUNTER — Other Ambulatory Visit: Payer: Self-pay

## 2019-10-24 ENCOUNTER — Inpatient Hospital Stay (HOSPITAL_COMMUNITY)
Admission: AD | Admit: 2019-10-24 | Discharge: 2019-10-28 | DRG: 885 | Disposition: A | Discharge status: Home / Self Care | Payer: Medicaid Other | Source: Intra-hospital | Attending: Emergency Medicine | Admitting: Emergency Medicine

## 2019-10-24 DIAGNOSIS — I10 Essential (primary) hypertension: Secondary | ICD-10-CM | POA: Diagnosis present

## 2019-10-24 DIAGNOSIS — E11610 Type 2 diabetes mellitus with diabetic neuropathic arthropathy: Secondary | ICD-10-CM | POA: Diagnosis present

## 2019-10-24 DIAGNOSIS — Z21 Asymptomatic human immunodeficiency virus [HIV] infection status: Secondary | ICD-10-CM | POA: Diagnosis present

## 2019-10-24 DIAGNOSIS — Z7984 Long term (current) use of oral hypoglycemic drugs: Secondary | ICD-10-CM | POA: Diagnosis not present

## 2019-10-24 DIAGNOSIS — D696 Thrombocytopenia, unspecified: Secondary | ICD-10-CM | POA: Diagnosis present

## 2019-10-24 DIAGNOSIS — F17210 Nicotine dependence, cigarettes, uncomplicated: Secondary | ICD-10-CM | POA: Diagnosis present

## 2019-10-24 DIAGNOSIS — N390 Urinary tract infection, site not specified: Secondary | ICD-10-CM | POA: Diagnosis present

## 2019-10-24 DIAGNOSIS — Z590 Homelessness: Secondary | ICD-10-CM | POA: Diagnosis not present

## 2019-10-24 DIAGNOSIS — Z9119 Patient's noncompliance with other medical treatment and regimen: Secondary | ICD-10-CM

## 2019-10-24 DIAGNOSIS — Z20822 Contact with and (suspected) exposure to covid-19: Secondary | ICD-10-CM | POA: Diagnosis present

## 2019-10-24 DIAGNOSIS — F1210 Cannabis abuse, uncomplicated: Secondary | ICD-10-CM | POA: Diagnosis present

## 2019-10-24 DIAGNOSIS — F10239 Alcohol dependence with withdrawal, unspecified: Secondary | ICD-10-CM | POA: Diagnosis present

## 2019-10-24 DIAGNOSIS — Z79899 Other long term (current) drug therapy: Secondary | ICD-10-CM | POA: Diagnosis not present

## 2019-10-24 DIAGNOSIS — I959 Hypotension, unspecified: Secondary | ICD-10-CM | POA: Diagnosis present

## 2019-10-24 DIAGNOSIS — Z9114 Patient's other noncompliance with medication regimen: Secondary | ICD-10-CM | POA: Diagnosis not present

## 2019-10-24 DIAGNOSIS — I252 Old myocardial infarction: Secondary | ICD-10-CM | POA: Diagnosis not present

## 2019-10-24 DIAGNOSIS — F250 Schizoaffective disorder, bipolar type: Principal | ICD-10-CM | POA: Diagnosis present

## 2019-10-24 DIAGNOSIS — E785 Hyperlipidemia, unspecified: Secondary | ICD-10-CM | POA: Diagnosis present

## 2019-10-24 DIAGNOSIS — E860 Dehydration: Secondary | ICD-10-CM | POA: Diagnosis present

## 2019-10-24 DIAGNOSIS — Z765 Malingerer [conscious simulation]: Secondary | ICD-10-CM

## 2019-10-24 MED ORDER — BICTEGRAVIR-EMTRICITAB-TENOFOV 50-200-25 MG PO TABS
1.0000 | ORAL_TABLET | Freq: Every day | ORAL | Status: DC
  Administered 2019-10-24 – 2019-10-28 (×5): 1 via ORAL
  Filled 2019-10-24 (×5): qty 1
  Filled 2019-10-24: qty 7
  Filled 2019-10-24 (×2): qty 1

## 2019-10-24 MED ORDER — ACETAMINOPHEN 325 MG PO TABS: 650.0000 mg | ORAL_TABLET | Freq: Four times a day (QID) | ORAL | Status: DC | PRN

## 2019-10-24 MED ORDER — QUETIAPINE FUMARATE 400 MG PO TABS
400.0000 mg | ORAL_TABLET | Freq: Every day | ORAL | Status: DC
  Administered 2019-10-24: 400 mg via ORAL
  Filled 2019-10-24 (×2): qty 1

## 2019-10-24 MED ORDER — ATORVASTATIN CALCIUM 40 MG PO TABS
40.0000 mg | ORAL_TABLET | Freq: Every day | ORAL | Status: DC
  Administered 2019-10-24 – 2019-10-27 (×3): 40 mg via ORAL
  Filled 2019-10-24 (×6): qty 1

## 2019-10-24 MED ORDER — MAGNESIUM HYDROXIDE 400 MG/5ML PO SUSP: 30.0000 mL | Freq: Every day | ORAL | Status: DC | PRN

## 2019-10-24 MED ORDER — ALUM & MAG HYDROXIDE-SIMETH 200-200-20 MG/5ML PO SUSP: 30.0000 mL | ORAL | Status: DC | PRN

## 2019-10-24 MED ORDER — TRAZODONE HCL 50 MG PO TABS: 50.0000 mg | ORAL_TABLET | Freq: Every evening | ORAL | Status: DC | PRN

## 2019-10-24 MED ORDER — HYDROXYZINE HCL 25 MG PO TABS: 25.0000 mg | ORAL_TABLET | Freq: Three times a day (TID) | ORAL | Status: DC | PRN

## 2019-10-24 NOTE — Tx Team (Signed)
Initial Treatment Plan 10/24/2019 3:27 AM HUGH KAMARA [id number]    PATIENT STRESSORS: Financial difficulties Health problems Medication change or noncompliance Substance abuse   PATIENT STRENGTHS: General fund of knowledge Motivation for treatment/growth   PATIENT IDENTIFIED PROBLEMS: psychosis  Risk for SI  SA  "vampires"  Medication non-compliance                       DISCHARGE CRITERIA:  Improved stabilization in mood, thinking, and/or behavior Verbal commitment to aftercare and medication compliance  PRELIMINARY DISCHARGE PLAN: Attend aftercare/continuing care group Attend PHP/IOP Attend 12-step recovery group Outpatient therapy  PATIENT/FAMILY INVOLVEMENT: This treatment plan has been presented to and reviewed with the patient, MD SMOLA.  The patient and family have been given the opportunity to ask questions and make suggestions.  Delos Haring, RN 10/24/2019, 3:27 AM
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
## 2019-10-24 NOTE — BHH Suicide Risk Assessment (Signed)
[facility] Admission Suicide Risk Assessment   Nursing information obtained from:  Patient Demographic factors:  Male, Living alone Current Mental Status:  NA Loss Factors:  Decline in physical health, Financial problems / change in socioeconomic status Historical Factors:  Family history of mental illness or substance abuse Risk Reduction Factors:  NA  Total Time spent with patient: 45 minutes Principal Problem: <principal problem not specified> Diagnosis:  Active Problems:   Schizoaffective disorder, bipolar type (HCC)  Subjective Data: see eval  Continued Clinical Symptoms:  Alcohol Use Disorder Identification Test Final Score (AUDIT): 11 The "Alcohol Use Disorders Identification Test", Guidelines for Use in Primary Care, Second Edition.  World Science writer [facility]). Score between 0-7:  no or low risk or alcohol related problems. Score between 8-15:  moderate risk of alcohol related problems. Score between 16-19:  high risk of alcohol related problems. Score 20 or above:  warrants further diagnostic evaluation for alcohol dependence and treatment.   CLINICAL FACTORS:   Alcohol/Substance Abuse/Dependencies   Musculoskeletal: Strength & Muscle Tone: within normal limits Gait & Station: normal Patient leans: N/A  Psychiatric Specialty Exam: Physical Exam  Nursing note and vitals reviewed. Constitutional: He appears well-developed and well-nourished.    Review of Systems  Eyes: Negative.   Respiratory: Negative.   Cardiovascular: Negative.   Endocrine: Negative.   Genitourinary: Negative.   Musculoskeletal: Negative.   Neurological: Negative.     Blood pressure 113/82, pulse (!) 130, temperature 98.4 F (36.9 C), temperature source Oral, resp. rate 16, height 6\' 1"  (1.854 m), weight 64 kg.Body mass index is 18.6 kg/m.  General Appearance: Disheveled/eye patch  Eye Contact:  None  Speech:  Slow  Volume:  Decreased  Mood:  Dysphoric  Affect:  Congruent  Thought  Process:  Goal Directed  Orientation:  Full (Time, Place, and Person)  Thought Content:  Coherent and goal-directed  Suicidal Thoughts:  No  Homicidal Thoughts:  No  Memory:  Immediate;   Poor Recent;   Poor Remote;   Fair  Judgement:  Fair  Insight:  Fair  Psychomotor Activity:  Normal  Concentration:  Concentration: Poor and Attention Span: Poor  Recall:  of Knowledge:  Fair  Language:  Good  Akathisia:  NA  Handed:  Right  AIMS (if indicated):     Assets:  Communication Skills Desire for Improvement  ADL's:  Intact  Cognition:  WNL  Sleep:  Number of Hours: 3.75   COGNITIVE FEATURES THAT CONTRIBUTE TO RISK:  Loss of executive function    SUICIDE RISK:   Minimal: No identifiable suicidal ideation.  Patients presenting with no risk factors but with morbid ruminations; may be classified as minimal risk based on the severity of the depressive symptoms  PLAN OF CARE: see eval  I certify that inpatient services furnished can reasonably be expected to improve the patient's condition.   [employer], MD 10/24/2019, 9:11 AM

## 2019-10-24 NOTE — Progress Notes (Signed)
Recreation Therapy Notes  Date: 3.25.21 Time: 0950 Location: 500 Hall Dayroom  Group Topic: Communication, Team Building, Problem Solving  Goal Area(s) Addresses:  Patient will effectively work with peer towards shared goal.  Patient will identify skill used to make activity successful.  Patient will identify how skills used during activity can be used to reach post d/c goals.   Intervention: STEM Activity   Activity: [employer]. Patients were provided the following materials: 5 drinking straws, 5 rubber bands, 5 paper clips, 2 index cards and 2 drinking cups. Using the provided materials patients were asked to build a launching mechanisms to launch a ping pong ball approximately 12 feet. Patients were divided into teams of 3-5.   Education: Pharmacist, community, Building control surveyor.   Education Outcome: Acknowledges education/In group clarification offered/Needs additional education.   Clinical Observations/Feedback: Pt did not attend group.     Caroll Rancher, LRT/CTRS         Lillia Abed, Alfonsa Vaile A 10/24/2019 11:04 AM

## 2019-10-24 NOTE — Progress Notes (Signed)
Psychoeducational Group Note  Date:  10/24/2019 Time:  2027  Group Topic/Focus:  Wrap-Up Group:   The focus of this group is to help patients review their daily goal of treatment and discuss progress on daily workbooks.  Participation Level: Did Not Attend  Participation Quality:  Not Applicable  Affect:  Not Applicable  Cognitive:  Not Applicable  Insight:  Not Applicable  Engagement in Group: Not Applicable  Additional Comments:  The patient did not attend group this evening.   Hazle Coca S 10/24/2019, 8:27 PM

## 2019-10-24 NOTE — B[facility] Group Notes (Signed)
[facility] [employer] Group Therapy 10/24/2019 2:36 PM  Type of Therapy: Mental Health Association Presentation  Participation Level: Did not attend.   Modes of Intervention: Discussion, Education and Socialization   Summary of Progress/Problems: Mental Health Association (MHA) Speaker came to talk about his personal journey with mental health. The pt processed ways by which to relate to the speaker. MHA speaker provided handouts and educational information pertaining to groups and services offered by the MHA. Pt was engaged in speaker's presentation and was receptive to resources provided.   Ryana Montecalvo, MSW, LCSWA 10/24/2019 2:36 PM 

## 2019-10-24 NOTE — Plan of Care (Signed)
Progress note  D: pt found in bed; compliant with morning assessment. Pt states they slept poorly. Pt rates their depression/hopelessness/anxiety 03/10/09 out of 10 respectively. Pt has complaints of tremors, chills, and being lightheaded. Pt denies any physical pain. Pt has been reclusive to their room most of the day. Pt had complaints of avh but denies si/hi/ah/vh today. Pt also agrees to approach staff if these become apparent or before harming themself/others while at bhh.  A: Pt provided support and encouragement. Pt given medication per protocol and standing orders. Q25m safety checks implemented and continued.  R: Pt safe on the unit. Will continue to monitor.  Pt progressing in the following metrics  Problem: Education: Goal: Knowledge of [facility] Education information/materials will improve Outcome: Progressing Goal: Emotional status will improve Outcome: Progressing Goal: Mental status will improve Outcome: Progressing Goal: Verbalization of understanding the information provided will improve Outcome: Progressing

## 2019-10-24 NOTE — B[facility] Suicide Risk Assessment (Signed)
[facility] INPATIENT:  Family/Significant Other Suicide Prevention Education  Suicide Prevention Education:   SPE completed with patient, as patient refused to consent to family contact. SPI pamphlet provided to pt and pt was encouraged to share information with support network, ask questions, and talk about any concerns relating to SPE. Patient denies access to guns/firearms and verbalized understanding of information provided. Mobile Crisis information also provided to patient.  Baldo Daub, MSW, LCSW Clinical Social Worker [facility]  Phone: [phone]

## 2019-10-24 NOTE — H&P (Signed)
Psychiatric Admission Assessment Adult  Patient Identification: Derrick Cooper MRN:  [id number] Date of Evaluation:  10/24/2019 Chief Complaint:  Schizoaffective disorder, bipolar type (HCC) [F25.0] Principal Diagnosis:  Diagnosis:  Active Problems:   Schizoaffective disorder, bipolar type (HCC)  History of Present Illness:  According to the HPI of 3/27 Derrick Cooper is an 59 y.o. male patient presents as walk in via law enforcement under IVC.  Patient found by police outside yelling about vampires then asking police to shoot him.  IVC was taking out by law enforcement and patient brought to the hospital.  Unable to complete MSE related to acuity of patients condition.  During assessment patient is disorganized, having auditory and visual hallucinations with delusional thoughts that vampires are following standing outside watching him through the window, and seeing bugs covering the floor.     I interviewed the patient he simply makes poor eye contact states he "sees vampires" when asked to elaborate he states "they are things" so once again I believe he is malingering due to homelessness but he has been admitted and we will evaluate him overnight. No Evidence of responding to stimuli no thoughts of harming self or others on this exam - no involuntary movements  Screen reflects abuse of cannabis and benzodiazepines  Associated Signs/Symptoms: Depression Symptoms:  psychomotor retardation, (Hypo) Manic Symptoms:  n/a Anxiety Symptoms:  n/a Psychotic Symptoms:  probabale malingering  PTSD Symptoms: NA Total Time spent with patient: 45 minutes  Past Psychiatric History: Has past similar presentations  Is the patient at risk to self? No.  Has the patient been a risk to self in the past 6 months? No.  Has the patient been a risk to self within the distant past? No.  Is the patient a risk to others? No.  Has the patient been a risk to others in the past 6 months? No.  Has the patient been  a risk to others within the distant past? No.   Prior Inpatient Therapy:  see chart Prior Outpatient Therapy:  chronic noncompliance   Alcohol Screening: 1. How often do you have a drink containing alcohol?: 2 to 3 times a week 2. How many drinks containing alcohol do you have on a typical day when you are drinking?: 3 or 4 3. How often do you have six or more drinks on one occasion?: Monthly AUDIT-C Score: 6 4. How often during the last year have you found that you were not able to stop drinking once you had started?: Less than monthly 5. How often during the last year have you failed to do what was normally expected from you becasue of drinking?: Less than monthly 6. How often during the last year have you needed a first drink in the morning to get yourself going after a heavy drinking session?: Never 7. How often during the last year have you had a feeling of guilt of remorse after drinking?: Never 8. How often during the last year have you been unable to remember what happened the night before because you had been drinking?: Less than monthly 9. Have you or someone else been injured as a result of your drinking?: No 10. Has a relative or friend or a doctor or another health worker been concerned about your drinking or suggested you cut down?: Yes, but not in the last year Alcohol Use Disorder Identification Test Final Score (AUDIT): 11 Alcohol Brief Interventions/Follow-up: Patient Refused Substance Abuse History in the last 12 months:  Yes.   Consequences  of Substance Abuse: NA Previous Psychotropic Medications: Yes  Psychological Evaluations: No  Past Medical History:  Past Medical History:  Diagnosis Date  . Diabetes mellitus without complication ([place])   . Hepatitis C   . HIV infection ([place] [place])   . Schizophrenia ([place])   . Substance abuse ([place])    History reviewed. No pertinent surgical history. Family History:  Family History  Problem Relation Age of Onset  . Breast cancer  Mother   . Lung cancer Father   . Heart attack Father    Family Psychiatric  History: neg Tobacco Screening: Have you used any form of tobacco in the last 30 days? (Cigarettes, Smokeless Tobacco, Cigars, and/or Pipes): Yes Tobacco use, Select all that apply: 4 or less cigarettes per day Are you interested in Tobacco Cessation Medications?: Yes, will notify MD for an order Counseled patient on smoking cessation including recognizing danger situations, developing coping skills and basic information about quitting provided: Refused/Declined practical counseling Social History:  Social History   Substance and Sexual Activity  Alcohol Use Yes   Comment: 3-4 40oz beers a day     Social History   Substance and Sexual Activity  Drug Use Yes  . Types: Marijuana, Methamphetamines   Comment: has not used meth since 03/2017, marijuana once or twice a month    Additional Social History:                           Allergies:  No Known Allergies Lab Results:  Results for orders placed or performed during the hospital encounter of 10/23/19 (from the past 48 hour(s))  Respiratory Panel by RT PCR (Flu A&B, Covid) - Nasopharyngeal Swab     Status: None   Collection Time: 10/23/19  6:59 PM   Specimen: Nasopharyngeal Swab  Result Value Ref Range   SARS Coronavirus 2 by RT PCR NEGATIVE NEGATIVE    Comment: (NOTE) SARS-CoV-2 target nucleic acids are NOT DETECTED. The SARS-CoV-2 RNA is generally detectable in upper respiratoy specimens during the acute phase of infection. The lowest concentration of SARS-CoV-2 viral copies this assay can detect is 131 copies/mL. A negative result does not preclude SARS-Cov-2 infection and should not be used as the sole basis for treatment or other patient management decisions. A negative result may occur with  improper specimen collection/handling, submission of specimen other than nasopharyngeal swab, presence of viral mutation(s) within the areas  targeted by this assay, and inadequate number of viral copies (<131 copies/mL). A negative result must be combined with clinical observations, patient history, and epidemiological information. The expected result is Negative. Fact Sheet for Patients:  PinkCheek.be Fact Sheet for Healthcare Providers:  GravelBags.it This test is not yet ap proved or cleared by the [place] FDA and  has been authorized for detection and/or diagnosis of SARS-CoV-2 by FDA under an Emergency Use Authorization (EUA). This EUA will remain  in effect (meaning this test can be used) for the duration of the COVID-19 declaration under Section 564(b)(1) of the Act, 21 U.S.C. section 360bbb-3(b)(1), unless the authorization is terminated or revoked sooner.    Influenza A by PCR NEGATIVE NEGATIVE   Influenza B by PCR NEGATIVE NEGATIVE    Comment: (NOTE) The Xpert Xpress SARS-CoV-2/FLU/RSV assay is intended as an aid in  the diagnosis of influenza from Nasopharyngeal swab specimens and  should not be used as a sole basis for treatment. Nasal washings and  aspirates are unacceptable for Xpert Xpress SARS-CoV-2/FLU/RSV  testing. Fact Sheet for Patients: https://www.moore.com/https://www.fda.gov/media/142436/download Fact Sheet for Healthcare Providers: https://www.young.biz/https://www.fda.gov/media/142435/download This test is not yet approved or cleared by the [place] FDA and  has been authorized for detection and/or diagnosis of SARS-CoV-2 by  FDA under an Emergency Use Authorization (EUA). This EUA will remain  in effect (meaning this test can be used) for the duration of the  Covid-19 declaration under Section 564(b)(1) of the Act, 21  U.S.C. section 360bbb-3(b)(1), unless the authorization is  terminated or revoked. Performed at [facility], [address] [place], [place] [address]   Comprehensive metabolic panel     Status: Abnormal   Collection Time:  10/23/19  7:10 PM  Result Value Ref Range   Sodium 143 135 - 145 mmol/L   Potassium 3.7 3.5 - 5.1 mmol/L   Chloride 105 98 - 111 mmol/L   CO2 25 22 - 32 mmol/L   Glucose, Bld 71 70 - 99 mg/dL    Comment: Glucose reference range applies only to samples taken after fasting for at least 8 hours.   BUN 10 6 - 20 mg/dL   Creatinine, Ser 1.020.73 0.61 - 1.24 mg/dL   Calcium 8.5 (L) 8.9 - 10.3 mg/dL   Total Protein 8.4 (H) 6.5 - 8.1 g/dL   Albumin 4.1 3.5 - 5.0 g/dL   AST 725107 (H) 15 - 41 U/L   ALT 96 (H) 0 - 44 U/L   Alkaline Phosphatase 65 38 - 126 U/L   Total Bilirubin 0.5 0.3 - 1.2 mg/dL   GFR calc non Af Amer >60 >60 mL/min   GFR calc Af Amer >60 >60 mL/min   Anion gap 13 5 - 15    Comment: Performed at [facility], [address] [place], [place] [address]  Ethanol     Status: Abnormal   Collection Time: 10/23/19  7:10 PM  Result Value Ref Range   Alcohol, Ethyl (B) 276 (H) <10 mg/dL    Comment: (NOTE) Lowest detectable limit for serum alcohol is 10 mg/dL. For medical purposes only. Performed at [facility], [address] [place], [place] [address]   CBC with Diff     Status: Abnormal   Collection Time: 10/23/19  7:10 PM  Result Value Ref Range   WBC 7.1 4.0 - 10.5 K/uL   RBC 4.77 4.22 - 5.81 MIL/uL   Hemoglobin 14.6 13.0 - 17.0 g/dL   HCT 25.943.8 56.339.0 - 87.552.0 %   MCV 91.8 80.0 - 100.0 fL   MCH 30.6 26.0 - 34.0 pg   MCHC 33.3 30.0 - 36.0 g/dL   RDW 64.314.7 32.911.5 - 51.815.5 %   Platelets 122 (L) 150 - 400 K/uL   nRBC 0.0 0.0 - 0.2 %   Neutrophils Relative % 61 %   Neutro Abs 4.2 1.7 - 7.7 K/uL   Lymphocytes Relative 31 %   Lymphs Abs 2.2 0.7 - 4.0 K/uL   Monocytes Relative 8 %   Monocytes Absolute 0.6 0.1 - 1.0 K/uL   Eosinophils Relative 0 %   Eosinophils Absolute 0.0 0.0 - 0.5 K/uL   Basophils Relative 0 %   Basophils Absolute 0.0 0.0 - 0.1 K/uL   Immature Granulocytes 0 %   Abs Immature Granulocytes 0.02 0.00 - 0.07 K/uL    Comment:  Performed at [facility], [address] [place], [place] [address]  Lipid panel     Status: Abnormal   Collection Time: 10/23/19  7:10 PM  Result Value Ref Range   Cholesterol 197 0 - 200 mg/dL   Triglycerides 41 <263 mg/dL   HDL 89 >78 mg/dL   Total CHOL/HDL Ratio 2.2 RATIO   VLDL 8 0 - 40 mg/dL   LDL Cholesterol 588 (H) 0 - 99 mg/dL    Comment:        Total Cholesterol/HDL:CHD Risk Coronary Heart Disease Risk Table                     Men   Women  1/2 Average Risk   3.4   3.3  Average Risk       5.0   4.4  2 X Average Risk   9.6   7.1  3 X Average Risk  23.4   11.0        Use the calculated Patient Ratio above and the CHD Risk Table to determine the patient's CHD Risk.        ATP III CLASSIFICATION (LDL):  <100     mg/dL   Optimal  502-774  mg/dL   Near or Above                    Optimal  130-159  mg/dL   Borderline  128-786  mg/dL   High  >767     mg/dL   Very High Performed at [facility], [address], [place], [place] [address]   Hemoglobin A1c     Status: Abnormal   Collection Time: 10/23/19  7:10 PM  Result Value Ref Range   Hgb A1c MFr Bld 7.1 (H) 4.8 - 5.6 %    Comment: (NOTE) Pre diabetes:          5.7%-6.4% Diabetes:              >6.4% Glycemic control for   <7.0% adults with diabetes    Mean Plasma Glucose 157.07 mg/dL    Comment: Performed at [facility], [address], [place], [place] [address]  Urine rapid drug screen (hosp performed)     Status: Abnormal   Collection Time: 10/23/19 11:16 PM  Result Value Ref Range   Opiates NONE DETECTED NONE DETECTED   Cocaine NONE DETECTED NONE DETECTED   Benzodiazepines POSITIVE (A) NONE DETECTED   Amphetamines NONE DETECTED NONE DETECTED   Tetrahydrocannabinol POSITIVE (A) NONE DETECTED   Barbiturates NONE DETECTED NONE DETECTED    Comment: (NOTE) DRUG SCREEN FOR MEDICAL PURPOSES ONLY.  IF CONFIRMATION IS NEEDED FOR ANY PURPOSE, NOTIFY LAB WITHIN 5  DAYS. LOWEST DETECTABLE LIMITS FOR URINE DRUG SCREEN Drug Class                     Cutoff (ng/mL) Amphetamine and metabolites    1000 Barbiturate and metabolites    200 Benzodiazepine                 200 Tricyclics and metabolites     300 Opiates and metabolites        300 Cocaine and metabolites        300 THC                            50 Performed at [facility], [address] [place], [place] [address]   Urinalysis, Complete w Microscopic     Status: Abnormal   Collection Time: 10/23/19 11:16 PM  Result Value Ref Range  Color, Urine YELLOW YELLOW   APPearance HAZY (A) CLEAR   Specific Gravity, Urine 1.013 1.005 - 1.030   pH 5.0 5.0 - 8.0   Glucose, UA NEGATIVE NEGATIVE mg/dL   Hgb urine dipstick MODERATE (A) NEGATIVE   Bilirubin Urine NEGATIVE NEGATIVE   Ketones, ur NEGATIVE NEGATIVE mg/dL   Protein, ur 341 (A) NEGATIVE mg/dL   Nitrite POSITIVE (A) NEGATIVE   Leukocytes,Ua MODERATE (A) NEGATIVE   RBC / HPF 0-5 0 - 5 RBC/hpf   WBC, UA 21-50 0 - 5 WBC/hpf   Bacteria, UA MANY (A) NONE SEEN   Mucus PRESENT    Hyaline Casts, UA PRESENT     Comment: Performed at [facility], [address] [place], [place] [address]    Blood Alcohol level:  Lab Results  Component Value Date   ETH 276 (H) 10/23/2019   ETH <10 10/03/2019    Metabolic Disorder Labs:  Lab Results  Component Value Date   HGBA1C 7.1 (H) 10/23/2019   MPG 157.07 10/23/2019   MPG 142.72 10/03/2019   Lab Results  Component Value Date   PROLACTIN 12.2 10/03/2019   Lab Results  Component Value Date   CHOL 197 10/23/2019   TRIG 41 10/23/2019   HDL 89 10/23/2019   CHOLHDL 2.2 10/23/2019   VLDL 8 10/23/2019   LDLCALC 100 (H) 10/23/2019   LDLCALC 111 (H) 10/03/2019    Current Medications: Current Facility-Administered Medications  Medication Dose Route Frequency Provider Last Rate Last Admin  . acetaminophen (TYLENOL) tablet 650 mg  650 mg Oral Q6H PRN  Anike, Adaku C, NP      . alum & mag hydroxide-simeth (MAALOX/MYLANTA) 200-200-20 MG/5ML suspension 30 mL  30 mL Oral Q4H PRN Anike, Adaku C, NP      . hydrOXYzine (ATARAX/VISTARIL) tablet 25 mg  25 mg Oral TID PRN Anike, Adaku C, NP      . magnesium hydroxide (MILK OF MAGNESIA) suspension 30 mL  30 mL Oral Daily PRN Anike, Adaku C, NP      . QUEtiapine (SEROQUEL) tablet 400 mg  400 mg Oral QHS Malvin Johns, MD      . traZODone (DESYREL) tablet 50 mg  50 mg Oral QHS PRN Anike, Adaku C, NP       PTA Medications: Medications Prior to Admission  Medication Sig Dispense Refill Last Dose  . ARIPiprazole ER (ABILIFY MAINTENA) 400 MG PRSY prefilled syringe Inject into the muscle.     Marland Kitchen atorvastatin (LIPITOR) 40 MG tablet Take 40 mg by mouth daily.      Marland Kitchen BIKTARVY 50-200-25 MG TABS tablet TAKE 1 TABLET BY MOUTH DAILY. 30 tablet 4   . QUEtiapine (SEROQUEL) 400 MG tablet Take 1 tablet (400 mg total) by mouth at bedtime. 30 tablet 1     Musculoskeletal: Strength & Muscle Tone: within normal limits Gait & Station: normal Patient leans: N/A  Psychiatric Specialty Exam: Physical Exam  Nursing note and vitals reviewed. Constitutional: He appears well-developed and well-nourished.    Review of Systems  Eyes: Negative.   Respiratory: Negative.   Cardiovascular: Negative.   Endocrine: Negative.   Genitourinary: Negative.   Musculoskeletal: Negative.   Neurological: Negative.     Blood pressure 113/82, pulse (!) 130, temperature 98.4 F (36.9 C), temperature source Oral, resp. rate 16, height 6\' 1"  (1.854 m), weight 64 kg.Body mass index is 18.6 kg/m.  General Appearance: Disheveled/eye patch  Eye Contact:  None  Speech:  Slow  Volume:  Decreased  Mood:  Dysphoric  Affect:  Congruent  Thought Process:  Goal Directed  Orientation:  Full (Time, Place, and Person)  Thought Content:  Coherent and goal-directed  Suicidal Thoughts:  No  Homicidal Thoughts:  No  Memory:  Immediate;    Poor Recent;   Poor Remote;   Fair  Judgement:  Fair  Insight:  Fair  Psychomotor Activity:  Normal  Concentration:  Concentration: Poor and Attention Span: Poor  Recall:  [employer] of Knowledge:  Fair  Language:  Good  Akathisia:  NA  Handed:  Right  AIMS (if indicated):     Assets:  Communication Skills Desire for Improvement  ADL's:  Intact  Cognition:  WNL  Sleep:  Number of Hours: 3.75    Treatment Plan Summary: Daily contact with patient to assess and evaluate symptoms and progress in treatment and Medication management  Observation Level/Precautions:  15 minute checks  Laboratory:  UDS  Psychotherapy: cog Based  Medications:  seroquel  Consultations:    Discharge Concerns:  housing  Estimated LOS:2 d  Other:     Physician Treatment Plan for Primary Diagnosis:   Axis I-schizoaffective disorder versus malingering History of substance abuse Cannabis abuse Axis II deferred Axis III no acute medical findings  Long Term Goal(s): Improvement in symptoms so as ready for discharge  Short Term Goals: Ability to identify changes in lifestyle to reduce recurrence of condition will improve  Physician Treatment Plan for Secondary Diagnosis: Active Problems:   Schizoaffective disorder, bipolar type (HCC)  Long Term Goal(s): Improvement in symptoms so as ready for discharge  Short Term Goals: Ability to identify changes in lifestyle to reduce recurrence of condition will improve  I certify that inpatient services furnished can reasonably be expected to improve the patient's condition.    Malvin Johns, MD 3/25/20219:04 AM

## 2019-10-24 NOTE — Progress Notes (Signed)
Recreation Therapy Notes  Patient admitted to unit 3.25.21. Due to admission within last year, no new assessment conducted at this time. Last assessment conducted 3.4.21. Patient reports no changes in stressors from previous admission.  Patient reports reasons for current admission are hallucinations and drinking.  Patient stated he doesn't have any strengths but identified areas of improvement as "stop drinking and start taking medicine".  Patient denies SI and HI at this time. Patient states he sees a face on the bag and black bugs crawling on the walls.Patient reports goal of "try to get my life back right".  Information found below from assessment conducted 3.4.21    Coping Skills: Isolation, TV, Sports, Exercise, Substance Abuse, Avoidance  Leisure Interests: Walking, Reading      [place], LRT/CTRS    Caroll Rancher A 10/24/2019 11:17 AM
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
## 2019-10-24 NOTE — ED Notes (Signed)
Pt tx to [facility] by [employer]. [employer] provided with pt belongings to take to [facility].

## 2019-10-25 ENCOUNTER — Encounter (HOSPITAL_BASED_OUTPATIENT_CLINIC_OR_DEPARTMENT_OTHER): Payer: Self-pay | Admitting: Clinical Social Worker

## 2019-10-25 ENCOUNTER — Other Ambulatory Visit: Payer: Self-pay

## 2019-10-25 ENCOUNTER — Encounter (HOSPITAL_COMMUNITY): Payer: Self-pay | Admitting: Registered Nurse

## 2019-10-25 LAB — CBC WITH DIFFERENTIAL/PLATELET
Abs Immature Granulocytes: 0.01 10*3/uL (ref 0.00–0.07)
Basophils Absolute: 0 10*3/uL (ref 0.0–0.1)
Basophils Relative: 1 %
Eosinophils Absolute: 0 10*3/uL (ref 0.0–0.5)
Eosinophils Relative: 1 %
HCT: 43.5 % (ref 39.0–52.0)
Hemoglobin: 14.8 g/dL (ref 13.0–17.0)
Immature Granulocytes: 0 %
Lymphocytes Relative: 31 %
Lymphs Abs: 1.5 10*3/uL (ref 0.7–4.0)
MCH: 30.8 pg (ref 26.0–34.0)
MCHC: 34 g/dL (ref 30.0–36.0)
MCV: 90.4 fL (ref 80.0–100.0)
Monocytes Absolute: 0.4 10*3/uL (ref 0.1–1.0)
Monocytes Relative: 9 %
Neutro Abs: 2.8 10*3/uL (ref 1.7–7.7)
Neutrophils Relative %: 58 %
Platelets: 103 10*3/uL — ABNORMAL LOW (ref 150–400)
RBC: 4.81 MIL/uL (ref 4.22–5.81)
RDW: 14.5 % (ref 11.5–15.5)
WBC: 4.9 10*3/uL (ref 4.0–10.5)
nRBC: 0 % (ref 0.0–0.2)

## 2019-10-25 LAB — CBG MONITORING, ED: Glucose-Capillary: 131 mg/dL — ABNORMAL HIGH (ref 70–99)

## 2019-10-25 LAB — COMPREHENSIVE METABOLIC PANEL
ALT: 82 U/L — ABNORMAL HIGH (ref 0–44)
AST: 100 U/L — ABNORMAL HIGH (ref 15–41)
Albumin: 3.7 g/dL (ref 3.5–5.0)
Alkaline Phosphatase: 70 U/L (ref 38–126)
Anion gap: 12 (ref 5–15)
BUN: 14 mg/dL (ref 6–20)
CO2: 23 mmol/L (ref 22–32)
Calcium: 9.1 mg/dL (ref 8.9–10.3)
Chloride: 98 mmol/L (ref 98–111)
Creatinine, Ser: 0.94 mg/dL (ref 0.61–1.24)
GFR calc Af Amer: >60 mL/min (ref >60)
GFR calc non Af Amer: >60 mL/min (ref >60)
Glucose, Bld: 120 mg/dL — ABNORMAL HIGH (ref 70–99)
Potassium: 3.7 mmol/L (ref 3.5–5.1)
Sodium: 133 mmol/L — ABNORMAL LOW (ref 135–145)
Total Bilirubin: 1.2 mg/dL (ref 0.3–1.2)
Total Protein: 8.5 g/dL — ABNORMAL HIGH (ref 6.5–8.1)

## 2019-10-25 LAB — GLUCOSE, CAPILLARY: Glucose-Capillary: 149 mg/dL — ABNORMAL HIGH (ref 70–99)

## 2019-10-25 MED ORDER — SODIUM CHLORIDE 0.9 % IV BOLUS
1000.0000 mL | Freq: Once | INTRAVENOUS | Status: AC
  Administered 2019-10-25: 1000 mL via INTRAVENOUS

## 2019-10-25 MED ORDER — LOPERAMIDE HCL 2 MG PO CAPS: 2.0000 mg | ORAL_CAPSULE | ORAL | Status: AC | PRN

## 2019-10-25 MED ORDER — QUETIAPINE FUMARATE 200 MG PO TABS
200.0000 mg | ORAL_TABLET | Freq: Every day | ORAL | Status: DC
  Administered 2019-10-25 – 2019-10-27 (×3): 200 mg via ORAL
  Filled 2019-10-25: qty 7
  Filled 2019-10-25 (×5): qty 1

## 2019-10-25 MED ORDER — HYDROXYZINE HCL 25 MG PO TABS: 25.0000 mg | ORAL_TABLET | Freq: Four times a day (QID) | ORAL | Status: AC | PRN

## 2019-10-25 MED ORDER — THIAMINE HCL 100 MG PO TABS
100.0000 mg | ORAL_TABLET | Freq: Every day | ORAL | Status: DC
  Administered 2019-10-26 – 2019-10-28 (×3): 100 mg via ORAL
  Filled 2019-10-25 (×5): qty 1

## 2019-10-25 MED ORDER — ONDANSETRON 4 MG PO TBDP: 4.0000 mg | ORAL_TABLET | Freq: Four times a day (QID) | ORAL | Status: DC | PRN

## 2019-10-25 MED ORDER — THIAMINE HCL 100 MG/ML IJ SOLN: 100.0000 mg | Freq: Once | INTRAMUSCULAR | Status: DC

## 2019-10-25 MED ORDER — ADULT MULTIVITAMIN W/MINERALS CH
1.0000 | ORAL_TABLET | Freq: Every day | ORAL | Status: DC
  Administered 2019-10-25 – 2019-10-28 (×4): 1 via ORAL
  Filled 2019-10-25 (×7): qty 1

## 2019-10-25 MED ORDER — LORAZEPAM 1 MG PO TABS: 1.0000 mg | ORAL_TABLET | Freq: Four times a day (QID) | ORAL | Status: AC | PRN

## 2019-10-25 MED ORDER — THIAMINE HCL 100 MG PO TABS
100.0000 mg | ORAL_TABLET | Freq: Once | ORAL | Status: AC
  Administered 2019-10-25: 100 mg via ORAL
  Filled 2019-10-25: qty 1

## 2019-10-25 NOTE — ED Notes (Signed)
Spoke to Patients [facility] RN @ [phone].   RN unsure if patient IVC and will find out and call this RN back.

## 2019-10-25 NOTE — ED Notes (Signed)
Police transport at bedside.  

## 2019-10-25 NOTE — ED Provider Notes (Signed)
[facility]-EMERGENCY DEPT Provider Note   CSN: [id number] Arrival date & time: 10/25/19  1049     History Chief Complaint  Patient presents with  . Hypotension    Derrick Cooper is a 59 y.o. male.  59 y.o male with a PMH of HIV, Schizophrenia, presents to the ED sent in by [facility] for hypotension. Patient vitals around 0900 recorded with a pressure in the ~80 systolic. He reports feeling somewhat dizzy and lightheaded.  States "I feel somewhat tired ".  Patient also reports being on lisinopril medication for blood pressure control, has not received this medication while in the hospital.  Denies any complaints such as chest pain, shortness of breath, headaches.  The history is provided by the patient.       Past Medical History:  Diagnosis Date  . Diabetes mellitus without complication (HCC)   . Hepatitis C   . HIV infection (HCC)   . Schizophrenia (HCC)   . Substance abuse [facility])     Patient Active Problem List   Diagnosis Date Noted  . MDD (major depressive disorder), recurrent episode, severe (HCC) 08/09/2018  . Schizoaffective disorder, bipolar type (HCC)   . Health care maintenance 06/26/2018  . Tobacco use 06/26/2018  . Furuncle of right axilla 11/13/2017  . Hyperlipidemia associated with type 2 diabetes mellitus (HCC) 10/25/2017  . 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 HIV disease (HCC) 09/27/2017  . Chronic hepatitis C without hepatic coma (HCC) 09/27/2017  . Acute non-recurrent maxillary sinusitis 09/27/2017  . Schizophrenia (HCC) 09/07/2017  . Uncontrolled type 2 diabetes mellitus with microalbuminuria, with long-term current use of insulin (HCC) 09/07/2017  . Urinary incontinence 09/07/2017    History reviewed. No pertinent surgical history.     Family History  Problem Relation Age of Onset  . Breast cancer Mother   . Lung cancer Father   .  Heart attack Father     Social History   Tobacco Use  . Smoking status: Current Every Day Smoker    Packs/day: 0.50    Years: 40.00    Pack years: 20.00    Types: Cigarettes  . Smokeless tobacco: Never Used  Substance Use Topics  . Alcohol use: Yes    Comment: 3-4 40oz beers a day  . Drug use: Yes    Types: Marijuana, Methamphetamines    Comment: has not used meth since 03/2017, marijuana once or twice a month    Home Medications Prior to Admission medications   Medication Sig Start Date End Date Taking? Authorizing Provider  ARIPiprazole ER (ABILIFY MAINTENA) 400 MG PRSY prefilled syringe Inject into the muscle.   Yes [provider]  atorvastatin (LIPITOR) 40 MG tablet Take 40 mg by mouth daily.  06/04/19  Yes [provider]  BIKTARVY 50-200-25 MG TABS tablet TAKE 1 TABLET BY MOUTH DAILY. 07/11/19  Yes Veryl Speak, FNP  QUEtiapine (SEROQUEL) 400 MG tablet Take 1 tablet (400 mg total) by mouth at bedtime. 10/04/19  Yes Malvin Johns, MD    Allergies    Patient has no known allergies.  Review of Systems   Review of Systems  Constitutional: Negative for fever.  HENT: Negative for sore throat.   Respiratory: Negative for shortness of breath.   Cardiovascular: Negative for chest pain.  Gastrointestinal: Negative for abdominal pain and vomiting.  Genitourinary: Negative for flank pain.  Musculoskeletal: Negative for back pain.  Skin: Negative  for pallor and wound.  Neurological: Positive for light-headedness.  All other systems reviewed and are negative.   Physical Exam Updated Vital Signs BP (!) 143/100   Pulse 88   Temp 98.4 F (36.9 C) (Oral)   Resp 17   Ht 6\' 1"  (1.854 m)   Wt 64 kg   SpO2 99%   BMI 18.60 kg/m   Physical Exam Vitals and nursing note reviewed.  Constitutional:      Appearance: Normal appearance.  Neurological:     Mental Status: He is alert.     ED Results / Procedures / Treatments   Labs (all labs ordered are  listed, but only abnormal results are displayed) Labs Reviewed  GLUCOSE, CAPILLARY - Abnormal; Notable for the following components:      Result Value   Glucose-Capillary 149 (*)    All other components within normal limits  CBC WITH DIFFERENTIAL/PLATELET - Abnormal; Notable for the following components:   Platelets 103 (*)    All other components within normal limits  COMPREHENSIVE METABOLIC PANEL - Abnormal; Notable for the following components:   Sodium 133 (*)    Glucose, Bld 120 (*)    Total Protein 8.5 (*)    AST 100 (*)    ALT 82 (*)    All other components within normal limits  CBG MONITORING, ED - Abnormal; Notable for the following components:   Glucose-Capillary 131 (*)    All other components within normal limits    EKG None  Radiology No results found.  Procedures Procedures (including critical care time)  Medications Ordered in ED Medications  atorvastatin (LIPITOR) tablet 40 mg (40 mg Oral Given 10/24/19 1803)  bictegravir-emtricitabine-tenofovir AF (BIKTARVY) 50-200-25 MG per tablet 1 tablet (1 tablet Oral Given 10/25/19 0808)  QUEtiapine (SEROQUEL) tablet 200 mg (has no administration in time range)  thiamine tablet 100 mg (has no administration in time range)  multivitamin with minerals tablet 1 tablet (1 tablet Oral Given 10/25/19 1156)  LORazepam (ATIVAN) tablet 1 mg (has no administration in time range)  hydrOXYzine (ATARAX/VISTARIL) tablet 25 mg (has no administration in time range)  loperamide (IMODIUM) capsule 2-4 mg (has no administration in time range)  sodium chloride 0.9 % bolus 1,000 mL (1,000 mLs Intravenous New Bag/Given 10/25/19 1149)  thiamine tablet 100 mg (100 mg Oral Given 10/25/19 1156)    ED Course  I have reviewed the triage vital signs and the nursing notes.  Pertinent labs & imaging results that were available during my care of the patient were reviewed by me and considered in my medical decision making (see chart for details).     MDM Rules/Calculators/A&P  Patient sent in by [facility] for hypotension and lethargy.  He arrived in the ED alert and oriented x4, blood pressure taken by me in triage was 104/84.  He reports somewhat feeling lightheaded.  According to his chart review, patient is here for schizophrenia, suicidal ideations, was IVC by [employer].  During my evaluation patient is likely ill-appearing, orthostatic vital signs were obtained without much changes in his heart rate or blood pressure.  He was given his morning meds such as thiamine along with his multivitamin.  Also given a liter of fluids.  Interpretation of his labs with a CMP for some mild hyponatremia, slight elevation on LFTs, does have a prior history of chronic alcohol abuse.  Current level is within normal limits.  CBC without any leukocytosis, no signs of anemia.  Blood glucose remains at his baseline  in the 130s.  No anion gap, CO2 is normal.  He received a liter of fluids, heart rate has improved to 88, blood pressure remains within his baseline.  He does report taking lisinopril for blood pressure control is nowhere to be found on his records.  He is not having any chest pain, shortness of breath, headaches. Patient is stable to be continue care at [facility].    Portions of this note were generated with Scientist, clinical (histocompatibility and immunogenetics). Dictation errors may occur despite best attempts at proofreading.  Final Clinical Impression(s) / ED Diagnoses Final diagnoses:  Dehydration    Rx / DC Orders ED Discharge Orders    None       Claude Manges, PA-C 10/25/19 1346    Milagros Loll, MD 10/25/19 1806

## 2019-10-25 NOTE — ED Notes (Addendum)
Per chart patient was IVCd yesterday. Patient presents from [facility] with no IVC paper work and with a Clinical biochemist. Staff member unsure if patient is IVCd.   Attempted to call [facility] charge nurse on [address] at [phone]. No answer at this time.   Patient calm and cooperative.  A/OX4

## 2019-10-25 NOTE — Progress Notes (Addendum)
[facility] MD Progress Note  10/25/2019 9:05 AM Derrick Cooper  MRN:  [id number] Subjective:  Patient reports he feels dizzy. Endorses having auditory ( " whispers last night") and visual ( " I thought I saw someone outside the window") hallucinations. Currently denies suicidal ideations.  Denies medication side effects.  Objective : I have discussed case with treatment team and have met with patient. 60 y old male, presented via [facility] under IVC. He has a history of chronic mental illness and has been diagnosed with Schizoaffective Disorder in the past, with a history of prior psychiatric admissions. Most recently had been observed overnight earlier in March.  Reportedly patient was exhibiting disorganized behaviors, yelling about vampires, asking police to shoot him.  Patient reports he has been drinking 3 to 4 40 ounce beers daily, using cannabis.  Also reported recent use of cocaine but denies pattern of cocaine abuse.  He has been off his psychiatric medications for about two weeks prior to admission. Patient had been recently assessed earlier this month. At the time was discharged on Abilify Maintena 400 mgrs mg IM monthly , Seroquel 400 mgrs QHS, Biktarvy ( for HIV) .  Today patient presents alert, attentive, vaguely anxious.  Reports mood as depressed and anxious.  Denies suicidal ideations.  Endorses, as above, some persistent hallucinations including hearing "whispers" last night and seeing somebody out of his window recently.  Currently he does not appear internally preoccupied. Tolerating medications well, denies side effects.  He was restarted on Seroquel 400 mg nightly which she states has been well-tolerated in the past.  He is also back on [place], for HIV. Patient reports dizziness and vitals earlier today are remarkable for hypotension. BP 91/67, pulse 117 sitting. BP standing decreased to 74/47. Currently in bed, does not appear to be in any acute distress . He presents mildly tremulous. No  diaphoresis noted . CBG 149  Staff reports he has been spending most time in bed, with limited milieu participation at this time.  Currently denies suicidal ideations and contracts for safety on unit Labs reviewed - AST 107, ALT 96 on 3/24.  HgbA1 C 7.1. Recent TSH ( 10/03/2019) 0.504 3/25 EKG sinus tachycardia. Pulse 107, QTc 438.   Principal Problem:  Schizoaffective Disorder by history  Diagnosis: Active Problems:   Schizoaffective disorder, bipolar type ([place])  Total Time spent with patient: 20 minutes  Past Psychiatric History:   Past Medical History:  Past Medical History:  Diagnosis Date  . Diabetes mellitus without complication ([place])   . Hepatitis C   . HIV infection ([place])   . Schizophrenia ([place])   . Substance abuse ([place])    History reviewed. No pertinent surgical history. Family History:  Family History  Problem Relation Age of Onset  . Breast cancer Mother   . Lung cancer Father   . Heart attack Father    Family Psychiatric  History:  Social History:  Social History   Substance and Sexual Activity  Alcohol Use Yes   Comment: 3-4 40oz beers a day     Social History   Substance and Sexual Activity  Drug Use Yes  . Types: Marijuana, Methamphetamines   Comment: has not used meth since 03/2017, marijuana once or twice a month    Social History   Socioeconomic History  . Marital status: Single    Spouse name: Not on file  . Number of children: 0  . Years of education: 9  . Highest education level: Not on file  Occupational History  . Not on file  Tobacco Use  . Smoking status: Current Every Day Smoker    Packs/day: 0.50    Years: 40.00    Pack years: 20.00    Types: Cigarettes  . Smokeless tobacco: Never Used  Substance and Sexual Activity  . Alcohol use: Yes    Comment: 3-4 40oz beers a day  . Drug use: Yes    Types: Marijuana, Methamphetamines    Comment: has not used meth since 03/2017, marijuana once or twice a month  . Sexual activity: Not  Currently    Partners: Female    Comment: declined condoms 06/2019  Other Topics Concern  . Not on file  Social History Narrative  . Not on file   Social Determinants of Health   Financial Resource Strain:   . Difficulty of Paying Living Expenses:   Food Insecurity:   . Worried About Charity fundraiser in the Last Year:   . Arboriculturist in the Last Year:   Transportation Needs:   . Film/video editor (Medical):   Marland Kitchen Lack of Transportation (Non-Medical):   Physical Activity:   . Days of Exercise per Week:   . Minutes of Exercise per Session:   Stress:   . Feeling of Stress :   Social Connections:   . Frequency of Communication with Friends and Family:   . Frequency of Social Gatherings with Friends and Family:   . Attends Religious Services:   . Active Member of Clubs or Organizations:   . Attends Archivist Meetings:   Marland Kitchen Marital Status:    Additional Social History:   Sleep: Fair  Appetite:  Fair  Current Medications: Current Facility-Administered Medications  Medication Dose Route Frequency Provider Last Rate Last Admin  . acetaminophen (TYLENOL) tablet 650 mg  650 mg Oral Q6H PRN Anike, Adaku C, NP      . alum & mag hydroxide-simeth (MAALOX/MYLANTA) 200-200-20 MG/5ML suspension 30 mL  30 mL Oral Q4H PRN Anike, Adaku C, NP      . atorvastatin (LIPITOR) tablet 40 mg  40 mg Oral q1800 Johnn Hai, MD   40 mg at 10/24/19 1803  . bictegravir-emtricitabine-tenofovir AF (BIKTARVY) 50-200-25 MG per tablet 1 tablet  1 tablet Oral Daily Johnn Hai, MD   1 tablet at 10/25/19 [phone]  . hydrOXYzine (ATARAX/VISTARIL) tablet 25 mg  25 mg Oral TID PRN Anike, Adaku C, NP      . magnesium hydroxide (MILK OF MAGNESIA) suspension 30 mL  30 mL Oral Daily PRN Anike, Adaku C, NP      . QUEtiapine (SEROQUEL) tablet 400 mg  400 mg Oral QHS Johnn Hai, MD   400 mg at 10/24/19 2038  . traZODone (DESYREL) tablet 50 mg  50 mg Oral QHS PRN Anike, Adaku C, NP        Lab  Results:  Results for orders placed or performed during the hospital encounter of 10/24/19 (from the past 48 hour(s))  Glucose, capillary     Status: Abnormal   Collection Time: 10/25/19  8:56 AM  Result Value Ref Range   Glucose-Capillary 149 (H) 70 - 99 mg/dL    Comment: Glucose reference range applies only to samples taken after fasting for at least 8 hours.    Blood Alcohol level:  Lab Results  Component Value Date   ETH 276 (H) 10/23/2019   ETH <10 17/00/1749    Metabolic Disorder Labs: Lab Results  Component Value Date  HGBA1C 7.1 (H) 10/23/2019   MPG 157.07 10/23/2019   MPG 142.72 10/03/2019   Lab Results  Component Value Date   PROLACTIN 12.2 10/03/2019   Lab Results  Component Value Date   CHOL 197 10/23/2019   TRIG 41 10/23/2019   HDL 89 10/23/2019   CHOLHDL 2.2 10/23/2019   VLDL 8 10/23/2019   LDLCALC 100 (H) 10/23/2019   LDLCALC 111 (H) 10/03/2019    Physical Findings: AIMS: Facial and Oral Movements Muscles of Facial Expression: None, normal Lips and Perioral Area: None, normal Jaw: None, normal Tongue: None, normal,Extremity Movements Upper (arms, wrists, hands, fingers): None, normal Lower (legs, knees, ankles, toes): None, normal, Trunk Movements Neck, shoulders, hips: None, normal, Overall Severity Severity of abnormal movements (highest score from questions above): None, normal Incapacitation due to abnormal movements: None, normal Patient's awareness of abnormal movements (rate only patient's report): No Awareness, Dental Status Current problems with teeth and/or dentures?: No Does patient usually wear dentures?: No  CIWA:  CIWA-Ar Total: 3 COWS:  COWS Total Score: 5  Musculoskeletal: Strength & Muscle Tone: within normal limits  Slightly/ Mildly tremulous  Gait & Station: normal Patient leans: N/A  Psychiatric Specialty Exam: Physical Exam  Review of Systems reports feeling dizzy, no shortness of breath at room air, no cough, no chest  pain, no vomiting   Blood pressure 113/82, pulse (!) 130, temperature 98.4 F (36.9 C), temperature source Oral, resp. rate 16, height '6\' 1"'  (1.854 m), weight 64 kg.Body mass index is 18.6 kg/m.  General Appearance: Fairly Groomed  Eye Contact:  Good  Speech:  Normal Rate  Volume:  Decreased  Mood:  depressed, anxious  Affect:  Congruent  Thought Process: generally linear, circumstantial  Orientation:  Other:  oriented to [facility], to March, 2021  Thought Content:  reports auditory , visual hallucinations, does not appear internally preoccupied at this time  Suicidal Thoughts:  No currently denies suicidal or self injurious ideations, denies violent or homicidal ideations  Homicidal Thoughts:  No  Memory:  recent and remote fair   Judgement:  Fair  Insight:  Fair  Psychomotor Activity:  in bed, no psychomotor restlessness, mildly tremulous   Concentration:  Concentration: Fair and Attention Span: Fair  Recall:  [employer] of Knowledge:  Fair  Language:  Fair  Akathisia:  Negative  Handed:  Right  AIMS (if indicated):     Assets:  Desire for Improvement Resilience  ADL's:  Intact  Cognition:  WNL  Sleep:  Number of Hours: 5.5    Assessment -  88 y old male, presented via [employer] under IVC. He has a history of chronic mental illness and has been diagnosed with Schizoaffective Disorder in the past, with a history of prior psychiatric admissions. Most recently had been observed overnight earlier in March.  Reportedly patient was exhibiting disorganized behaviors, yelling about vampires, asking police to shoot him.  Patient reports he has been drinking 3 to 4 40 ounce beers daily, using cannabis.  Also reported recent use of cocaine but denies pattern of cocaine abuse.  He has been off his psychiatric medications for about two weeks prior to admission. Patient had been recently assessed earlier this month. At the time was discharged on Abilify Maintena 400 mgrs mg IM monthly , Seroquel 400 mgrs  QHS, Biktarvy ( for HIV) .  Patient reports that Seroquel has historically been well-tolerated and effective.  At present patient reports some ongoing psychotic symptoms and states he saw somebody outside his  window and heard some whispers recently.  He currently does not appear internally preoccupied.  He denies suicidal ideations at this time does endorse some ongoing depression.  He is presenting with mild tremors and reports a history of daily alcohol consumption prior to admission, up to 160  ounces of beer per day. (  Admission BAL on 3/24 276. UDS positive for cannabis) Today BP is trending low Treatment Plan Summary: Daily contact with patient to assess and evaluate symptoms and progress in treatment, Medication management, Plan inpatient treatment  and medications as below Encourage group and milieu participation Encourage efforts to work on sobriety and relapse prevention Will decrease Seroquel to 200 mgrs QHS , could be contributing to hypotension/orthostasis - titrate gradually as tolerated from there D/C trazodone Continue Biktarvy 1 tablet daily for HIV Start Ativan as per alcohol detox protocol-thiamine and folate supplementation Consult DM Care Coordinator for assistance with DM mangement * Based on  Hypotension/orthostasis associated with symptoms ( dizziness, lightheadedness) patient may benefit from IV fluids . Have reviewed with [facility] ED Physician who agrees. Will send patient to ED for assessment , workup and likely IV fluid management.  F Ovella Manygoats,MD  Jenne Campus, MD 10/25/2019, 9:05 AM

## 2019-10-25 NOTE — Progress Notes (Signed)
   10/24/19 2211  Psych Admission Type (Psych Patients Only)  Admission Status Involuntary  Psychosocial Assessment  Patient Complaints Anxiety  Eye Contact Fair  Facial Expression Anxious;Sullen;Sad;Worried  Affect Anxious;Appropriate to circumstance;Preoccupied;Sad;Sullen  Speech Logical/coherent  Interaction Assertive  Motor Activity Slow;Unsteady  Appearance/Hygiene Unremarkable  Behavior Characteristics Cooperative  Mood Depressed;Preoccupied  Thought Process  Coherency Circumstantial  Content Preoccupation  Delusions None reported or observed  Perception Hallucinations  Hallucination Auditory;Visual ("not today")  Judgment Poor  Confusion None  Danger to Self  Current suicidal ideation? Denies  Self-Injurious Behavior No self-injurious ideation or behavior indicators observed or expressed   Agreement Not to Harm Self Yes  Description of Agreement verbal commitment for safety  Danger to Others  Danger to Others None reported or observed  Danger to Others Abnormal  Harmful Behavior to others No threats or harm toward other people  Destructive Behavior No threats or harm toward property  D: Patient in his room all evening only coming out for medications.  A: Medications administered as prescribed. Support and encouragement provided as needed.  R: Patient remains safe on the unit. Will continue to monitor for safety and stability.

## 2019-10-25 NOTE — ED Notes (Signed)
Spoke to [employer]. Per RN patient is IVC's but did not send his IVC paper work over with him.

## 2019-10-25 NOTE — Progress Notes (Signed)
Adult Psychoeducational Group Note  Date:  10/25/2019 Time:  11:02 PM   Group Topic/Focus:  Wrap-Up Group:   The focus of this group is to help patients review their daily goal of treatment and discuss progress on daily workbooks.  Participation Level:  Did Not Attend  Participation Quality:  Did Not Attend  Affect:  Did Not Attend  Cognitive:  Did Not Attend  Insight: None  Engagement in Group:  Did Not Attend  Modes of Intervention:  Did Not Attend  Additional Comments:  Pt did not attend evening wrap up group tonight.  Felipa Furnace 10/25/2019, 11:02 PM

## 2019-10-25 NOTE — Progress Notes (Signed)
   10/25/19 2200  Psych Admission Type (Psych Patients Only)  Admission Status Involuntary  Psychosocial Assessment  Patient Complaints Anxiety  Eye Contact Fair  Facial Expression Anxious;Sullen;Worried  Affect Appropriate to circumstance  Furniture conservator/restorer  Appearance/Hygiene Unremarkable  Behavior Characteristics Cooperative  Mood Anxious  Aggressive Behavior  Effect No apparent injury  Thought Process  Coherency Circumstantial  Content Preoccupation  Delusions None reported or observed  Perception Hallucinations  Hallucination Auditory;Visual  Judgment Poor  Confusion None  Danger to Self  Current suicidal ideation? Denies  Self-Injurious Behavior No self-injurious ideation or behavior indicators observed or expressed   Agreement Not to Harm Self Yes  Description of Agreement verbal commitment for safety  Danger to Others  Danger to Others None reported or observed  Danger to Others Abnormal  Harmful Behavior to others No threats or harm toward other people  Destructive Behavior No threats or harm toward property

## 2019-10-25 NOTE — Progress Notes (Signed)
Inpatient Diabetes Program Recommendations  AACE/ADA: New Consensus Statement on Inpatient Glycemic Control (2015)  Target Ranges:  Prepandial:   less than 140 mg/dL      Peak postprandial:   less than 180 mg/dL (1-2 hours)      Critically ill patients:  140 - 180 mg/dL   Lab Results  Component Value Date   GLUCAP 131 (H) 10/25/2019   HGBA1C 7.1 (H) 10/23/2019    Review of Glycemic Control  Diabetes history: DM2 Outpatient Diabetes medications: Lantus 60 units QD, metformin 500 mg bid (from MD notes 06/04/19) Current orders for Inpatient glycemic control: None  HgbA1C - 7.1%  Inpatient Diabetes Program Recommendations:     Novolog 0-9 units tidwc and hs Metformin 500 mg bid  If FBS > 180 mg/dL, add Lantus 10 units QD  Will continue to follow.  Thank you. Ailene Ards, RD, LDN, CDE Inpatient Diabetes Coordinator [phone]

## 2019-10-25 NOTE — Progress Notes (Signed)
   10/25/19 1600  Psych Admission Type (Psych Patients Only)  Admission Status Involuntary  Psychosocial Assessment  Patient Complaints Anxiety  Eye Contact Fair  Facial Expression Anxious;Sullen;Worried  Affect Appropriate to circumstance  Furniture conservator/restorer  Appearance/Hygiene Unremarkable  Behavior Characteristics Cooperative  Mood Anxious;Preoccupied  Aggressive Behavior  Effect No apparent injury  Thought Process  Coherency Circumstantial  Content Preoccupation  Delusions None reported or observed  Perception Hallucinations  Hallucination Auditory;Visual  Judgment Poor  Confusion None  Danger to Self  Current suicidal ideation? Denies  Self-Injurious Behavior No self-injurious ideation or behavior indicators observed or expressed   Agreement Not to Harm Self Yes  Description of Agreement verbal commitment for safety  Danger to Others  Danger to Others None reported or observed  Danger to Others Abnormal  Harmful Behavior to others No threats or harm toward other people  Destructive Behavior No threats or harm toward property

## 2019-10-25 NOTE — Tx Team (Signed)
Interdisciplinary Treatment and Diagnostic Plan Update  10/25/2019 Time of Session: 900am Derrick Cooper MRN: [id number]  Principal Diagnosis: <principal problem not specified>  Secondary Diagnoses: Active Problems:   Schizoaffective disorder, bipolar type ([place])   Current Medications:  Current Facility-Administered Medications  Medication Dose Route Frequency Provider Last Rate Last Admin  . atorvastatin (LIPITOR) tablet 40 mg  40 mg Oral q1800 Johnn Hai, MD   40 mg at 10/24/19 1803  . bictegravir-emtricitabine-tenofovir AF (BIKTARVY) 50-200-25 MG per tablet 1 tablet  1 tablet Oral Daily Johnn Hai, MD   1 tablet at 10/25/19 [phone]  . hydrOXYzine (ATARAX/VISTARIL) tablet 25 mg  25 mg Oral Q6H PRN Cobos, Myer Peer, MD      . loperamide (IMODIUM) capsule 2-4 mg  2-4 mg Oral PRN Cobos, Myer Peer, MD      . LORazepam (ATIVAN) tablet 1 mg  1 mg Oral Q6H PRN Cobos, Myer Peer, MD      . multivitamin with minerals tablet 1 tablet  1 tablet Oral Daily Cobos, Myer Peer, MD      . QUEtiapine (SEROQUEL) tablet 200 mg  200 mg Oral QHS Cobos, Fernando A, MD      . sodium chloride 0.9 % bolus 1,000 mL  1,000 mL Intravenous Once Soto, Johana, PA-C      . thiamine (B-1) injection 100 mg  100 mg Intramuscular Once Cobos, Myer Peer, MD      . Derrill Memo ON 10/26/2019] thiamine tablet 100 mg  100 mg Oral Daily Cobos, Myer Peer, MD       Current Outpatient Medications  Medication Sig Dispense Refill  . ARIPiprazole ER (ABILIFY MAINTENA) 400 MG PRSY prefilled syringe Inject into the muscle.    Marland Kitchen atorvastatin (LIPITOR) 40 MG tablet Take 40 mg by mouth daily.     Marland Kitchen BIKTARVY 50-200-25 MG TABS tablet TAKE 1 TABLET BY MOUTH DAILY. 30 tablet 4  . QUEtiapine (SEROQUEL) 400 MG tablet Take 1 tablet (400 mg total) by mouth at bedtime. 30 tablet 1   PTA Medications: (Not in a hospital admission)   Patient Stressors: Financial difficulties Health problems Medication change or noncompliance Substance  abuse  Patient Strengths: Technical sales engineer for treatment/growth  Treatment Modalities: Medication Management, Group therapy, Case management,  1 to 1 session with clinician, Psychoeducation, Recreational therapy.   Physician Treatment Plan for Primary Diagnosis: <principal problem not specified> Long Term Goal(s): Improvement in symptoms so as ready for discharge Improvement in symptoms so as ready for discharge   Short Term Goals: Ability to identify changes in lifestyle to reduce recurrence of condition will improve Ability to identify changes in lifestyle to reduce recurrence of condition will improve  Medication Management: Evaluate patient's response, side effects, and tolerance of medication regimen.  Therapeutic Interventions: 1 to 1 sessions, Unit Group sessions and Medication administration.  Evaluation of Outcomes: Progressing  Physician Treatment Plan for Secondary Diagnosis: Active Problems:   Schizoaffective disorder, bipolar type ([place])  Long Term Goal(s): Improvement in symptoms so as ready for discharge Improvement in symptoms so as ready for discharge   Short Term Goals: Ability to identify changes in lifestyle to reduce recurrence of condition will improve Ability to identify changes in lifestyle to reduce recurrence of condition will improve     Medication Management: Evaluate patient's response, side effects, and tolerance of medication regimen.  Therapeutic Interventions: 1 to 1 sessions, Unit Group sessions and Medication administration.  Evaluation of Outcomes: Progressing   RN Treatment Plan for  Primary Diagnosis: <principal problem not specified> Long Term Goal(s): Knowledge of disease and therapeutic regimen to maintain health will improve  Short Term Goals: Ability to participate in decision making will improve, Ability to identify and develop effective coping behaviors will improve and Compliance with prescribed medications will  improve  Medication Management: RN will administer medications as ordered by provider, will assess and evaluate patient's response and provide education to patient for prescribed medication. RN will report any adverse and/or side effects to prescribing provider.  Therapeutic Interventions: 1 on 1 counseling sessions, Psychoeducation, Medication administration, Evaluate responses to treatment, Monitor vital signs and CBGs as ordered, Perform/monitor CIWA, COWS, AIMS and Fall Risk screenings as ordered, Perform wound care treatments as ordered.  Evaluation of Outcomes: Progressing   LCSW Treatment Plan for Primary Diagnosis: <principal problem not specified> Long Term Goal(s): Safe transition to appropriate next level of care at discharge, Engage patient in therapeutic group addressing interpersonal concerns.  Short Term Goals: Engage patient in aftercare planning with referrals and resources and Increase skills for wellness and recovery  Therapeutic Interventions: Assess for all discharge needs, 1 to 1 time with Social worker, Explore available resources and support systems, Assess for adequacy in community support network, Educate family and significant other(s) on suicide prevention, Complete Psychosocial Assessment, Interpersonal group therapy.  Evaluation of Outcomes: Progressing   Progress in Treatment: Attending groups: No. Participating in groups: No. Taking medication as prescribed: Yes. Toleration medication: Yes. Family/Significant other contact made: No, will contact:  when consent given Patient understands diagnosis: Yes. Discussing patient identified problems/goals with staff: Yes. Medical problems stabilized or resolved: No. Denies suicidal/homicidal ideation: Yes. Issues/concerns per patient self-inventory: No. Other: N/A  New problem(s) identified: No, Describe:  none  New Short Term/Long Term Goal(s): Detox, elimination of AVH/symptoms of psychosis, medication  management for mood stabilization; elimination of SI thoughts; development of comprehensive mental wellness/sobriety plan.   Patient Goals:  "To get back on my medications"  Discharge Plan or Barriers: SPE pamphlet, Mobile Crisis information, and AA/NA information provided to patient for additional community support and resources. Pt has an appointment with [facility] scheduled for 11/13/19.  Reason for Continuation of Hospitalization: Hallucinations Medication stabilization  Estimated Length of Stay: 3-5 days  Attendees: Patient: Derrick Cooper 10/25/2019 11:46 AM  Physician: Dr Jama Flavors MD 10/25/2019 11:46 AM  Nursing:  10/25/2019 11:46 AM  RN Care Manager: 10/25/2019 11:46 AM  Social Worker: Zollie Scale Kentavius Dettore LCSW 10/25/2019 11:46 AM  Recreational Therapist:  10/25/2019 11:46 AM  Other:  10/25/2019 11:46 AM  Other:  10/25/2019 11:46 AM  Other: 10/25/2019 11:46 AM    Scribe for Treatment Team: Charlann Lange Kasper Mudrick, LCSW 10/25/2019 11:46 AM

## 2019-10-25 NOTE — ED Triage Notes (Signed)
Patient was sent from [facility] with hypotension and lethargic. Patient accompanied by [facility] staff member.

## 2019-10-25 NOTE — ED Notes (Signed)
I gave patient a sandwich and cheese with a diet coke

## 2019-10-25 NOTE — ED Notes (Signed)
Police transport called for IVC patient transport.

## 2019-10-25 NOTE — Progress Notes (Signed)
DOCUMENT TYPE: Social Work - Follow-up Note    PATIENT: Patrick Davis, Patrick Davis   MRN: [id number]  DOB: 11/07/1960    ENCOUNTER DATE:  10/25/2019    ASSOCIATED PROGRAM-CLINIC:  [place]    CONTACT TYPE:  Collateral    BRIEF DESCRIPTION:  CASE CLOSURE: Discharged    PATIENT DESCRIPTION, PRESENTING PROBLEM, PATIENT / FAMILY GOALS:  59 y/o, AA male Stage III. Pt is homeless and currently staying at [facility] shelter. PCP is MD Marygrace Drought and SW is [employer].    Last contact made with Pt was in 2017 and no subsequent contacts since. Pt to close Pt's file.    INTERVENTION / OUTCOME / PLAN:  Plan: SW [place] to close [employer] file at [facility]. Pt is welcome to return to clinic, should he want to re-engage in care.    DOCUMENT COMPOSED BY:   Linnell Fulling, LICSW [phone] on 10/25/2019

## 2019-10-26 LAB — GLUCOSE, CAPILLARY
Glucose-Capillary: 147 mg/dL — ABNORMAL HIGH (ref 70–99)
Glucose-Capillary: 98 mg/dL (ref 70–99)
Glucose-Capillary: 129 mg/dL — ABNORMAL HIGH (ref 70–99)
Glucose-Capillary: 87 mg/dL (ref 70–99)

## 2019-10-26 LAB — AMMONIA: Ammonia: 36 umol/L — ABNORMAL HIGH (ref 9–35)

## 2019-10-26 MED ORDER — METFORMIN HCL 500 MG PO TABS
500.0000 mg | ORAL_TABLET | Freq: Two times a day (BID) | ORAL | Status: DC
  Administered 2019-10-26 – 2019-10-28 (×4): 500 mg via ORAL
  Filled 2019-10-26 (×7): qty 1
  Filled 2019-10-26 (×2): qty 14

## 2019-10-26 MED ORDER — GLUCERNA SHAKE PO LIQD
237.0000 mL | Freq: Three times a day (TID) | ORAL | Status: DC
  Administered 2019-10-26 – 2019-10-27 (×5): 237 mL via ORAL

## 2019-10-26 MED ORDER — INSULIN ASPART 100 UNIT/ML ~~LOC~~ SOLN
0.0000 [IU] | Freq: Three times a day (TID) | SUBCUTANEOUS | Status: DC
  Administered 2019-10-26 – 2019-10-27 (×2): 1 [IU] via SUBCUTANEOUS

## 2019-10-26 MED ORDER — LEVOFLOXACIN 500 MG PO TABS
500.0000 mg | ORAL_TABLET | Freq: Every day | ORAL | Status: DC
  Administered 2019-10-26 – 2019-10-28 (×3): 500 mg via ORAL
  Filled 2019-10-26: qty 2
  Filled 2019-10-26 (×5): qty 1

## 2019-10-26 NOTE — Progress Notes (Signed)
[facility] MD Progress Note  10/26/2019 10:12 AM LARS JEZIORSKI  MRN:  [id number] Subjective: Patient is a 59 year old male with a past psychiatric history significant for schizoaffective disorder who was admitted on 10/24/2019 under involuntary commitment.  The patient was found by police outside of the building screaming about vampires and asking the police to shoot him.  Objective: Patient is seen and examined.  Patient is a 59 year old male with the above-stated past psychiatric history as well as a past medical history significant for diabetes mellitus and HIV who is seen in follow-up.  Unfortunately the patient had to be transferred to the emergency department yesterday secondary to dizziness.  Unfortunately he has a history of diabetes, but was noncompliant with his medications.  There is also some concern for hypotension and dehydration.  His laboratories yesterday revealed a mildly low sodium at 133, his blood sugar was only 120.  His AST was elevated at 100 and his ALT at 82.  These were essentially unchanged from 3 days prior to this evaluation.  His creatinine was also found to be normal.  His CBC was essentially normal except for low platelets at 103,000.  They were 122,003 days ago, and 3 weeks ago were at 160,000.  His TSH was low normal at 0.504.  His hemoglobin A1c was 7.1.  Urinalysis was moderate leukocyte esterase positive and nitrates positive.  There were many bacteria and hyaline casts.  There was 21-50 white blood cells.  Urinalysis was from 3/24, and unfortunately has not been treated, and also urine culture has not been sent as well.  His blood alcohol on admission was 276.  His drug screen was positive for marijuana and benzodiazepines.  His EKG showed an old infarct, but was sinus rhythm with normal QTc interval.  This morning he stated "I feel better", but does not appear that way.  He appears quite weak.  He denied auditory or visual hallucinations.  He denied suicidal or homicidal  ideation.  Recommendations from diabetic coordinator was to restart his Metformin and start him on a sliding scale insulin.  His previous note suggested that he had been on Lantus his high doses 60 units subcu nightly.  His blood sugar this morning was 147.  I have restarted his Metformin, and put him on a thin sensitive sliding scale until we can see a better intake by mouth.  Principal Problem: <principal problem not specified> Diagnosis: Active Problems:   Schizoaffective disorder, bipolar type ([place])  Total Time spent with patient: 30 minutes  Past Psychiatric History: See admission H&P  Past Medical History:  Past Medical History:  Diagnosis Date  . Diabetes mellitus without complication ([place])   . Hepatitis C   . HIV infection ([place])   . Schizophrenia ([place])   . Substance abuse ([place])    History reviewed. No pertinent surgical history. Family History:  Family History  Problem Relation Age of Onset  . Breast cancer Mother   . Lung cancer Father   . Heart attack Father    Family Psychiatric  History: See admission H&P Social History:  Social History   Substance and Sexual Activity  Alcohol Use Yes   Comment: 3-4 40oz beers a day     Social History   Substance and Sexual Activity  Drug Use Yes  . Types: Marijuana, Methamphetamines   Comment: has not used meth since 03/2017, marijuana once or twice a month    Social History   Socioeconomic History  . Marital status: Single  Spouse name: Not on file  . Number of children: 0  . Years of education: 9  . Highest education level: Not on file  Occupational History  . Not on file  Tobacco Use  . Smoking status: Current Every Day Smoker    Packs/day: 0.50    Years: 40.00    Pack years: 20.00    Types: Cigarettes  . Smokeless tobacco: Never Used  Substance and Sexual Activity  . Alcohol use: Yes    Comment: 3-4 40oz beers a day  . Drug use: Yes    Types: Marijuana, Methamphetamines    Comment: has not used meth  since 03/2017, marijuana once or twice a month  . Sexual activity: Not Currently    Partners: Female    Comment: declined condoms 06/2019  Other Topics Concern  . Not on file  Social History Narrative  . Not on file   Social Determinants of Health   Financial Resource Strain:   . Difficulty of Paying Living Expenses:   Food Insecurity:   . Worried About Programme researcher, broadcasting/film/video in the Last Year:   . Barista in the Last Year:   Transportation Needs:   . Freight forwarder (Medical):   Marland Kitchen Lack of Transportation (Non-Medical):   Physical Activity:   . Days of Exercise per Week:   . Minutes of Exercise per Session:   Stress:   . Feeling of Stress :   Social Connections:   . Frequency of Communication with Friends and Family:   . Frequency of Social Gatherings with Friends and Family:   . Attends Religious Services:   . Active Member of Clubs or Organizations:   . Attends Banker Meetings:   Marland Kitchen Marital Status:    Additional Social History:                         Sleep: Fair  Appetite:  Poor  Current Medications: Current Facility-Administered Medications  Medication Dose Route Frequency Provider Last Rate Last Admin  . atorvastatin (LIPITOR) tablet 40 mg  40 mg Oral q1800 Malvin Johns, MD   40 mg at 10/24/19 1803  . bictegravir-emtricitabine-tenofovir AF (BIKTARVY) 50-200-25 MG per tablet 1 tablet  1 tablet Oral Daily Malvin Johns, MD   1 tablet at 10/26/19 8299  . hydrOXYzine (ATARAX/VISTARIL) tablet 25 mg  25 mg Oral Q6H PRN Cobos, Rockey Situ, MD      . insulin aspart (novoLOG) injection 0-9 Units  0-9 Units Subcutaneous TID WC Antonieta Pert, MD      . loperamide (IMODIUM) capsule 2-4 mg  2-4 mg Oral PRN Cobos, Rockey Situ, MD      . LORazepam (ATIVAN) tablet 1 mg  1 mg Oral Q6H PRN Cobos, Rockey Situ, MD      . metFORMIN (GLUCOPHAGE) tablet 500 mg  500 mg Oral BID WC Antonieta Pert, MD      . multivitamin with minerals tablet 1 tablet   1 tablet Oral Daily Cobos, Rockey Situ, MD   1 tablet at 10/26/19 [phone]  . QUEtiapine (SEROQUEL) tablet 200 mg  200 mg Oral QHS Cobos, Rockey Situ, MD   200 mg at 10/25/19 2205  . thiamine tablet 100 mg  100 mg Oral Daily Cobos, Rockey Situ, MD   100 mg at 10/26/19 9678    Lab Results:  Results for orders placed or performed during the hospital encounter of 10/24/19 (from the past 48 hour(s))  Glucose, capillary     Status: Abnormal   Collection Time: 10/25/19  8:56 AM  Result Value Ref Range   Glucose-Capillary 149 (H) 70 - 99 mg/dL    Comment: Glucose reference range applies only to samples taken after fasting for at least 8 hours.  CBG monitoring, ED     Status: Abnormal   Collection Time: 10/25/19 11:22 AM  Result Value Ref Range   Glucose-Capillary 131 (H) 70 - 99 mg/dL    Comment: Glucose reference range applies only to samples taken after fasting for at least 8 hours.  CBC with Differential     Status: Abnormal   Collection Time: 10/25/19 11:30 AM  Result Value Ref Range   WBC 4.9 4.0 - 10.5 K/uL   RBC 4.81 4.22 - 5.81 MIL/uL   Hemoglobin 14.8 13.0 - 17.0 g/dL   HCT 08.643.5 57.839.0 - 46.952.0 %   MCV 90.4 80.0 - 100.0 fL   MCH 30.8 26.0 - 34.0 pg   MCHC 34.0 30.0 - 36.0 g/dL   RDW 62.914.5 52.811.5 - 41.315.5 %   Platelets 103 (L) 150 - 400 K/uL    Comment: REPEATED TO VERIFY PLATELET COUNT CONFIRMED BY SMEAR SPECIMEN CHECKED FOR CLOTS Immature Platelet Fraction may be clinically indicated, consider ordering this additional test [id number]    nRBC 0.0 0.0 - 0.2 %   Neutrophils Relative % 58 %   Neutro Abs 2.8 1.7 - 7.7 K/uL   Lymphocytes Relative 31 %   Lymphs Abs 1.5 0.7 - 4.0 K/uL   Monocytes Relative 9 %   Monocytes Absolute 0.4 0.1 - 1.0 K/uL   Eosinophils Relative 1 %   Eosinophils Absolute 0.0 0.0 - 0.5 K/uL   Basophils Relative 1 %   Basophils Absolute 0.0 0.0 - 0.1 K/uL   Immature Granulocytes 0 %   Abs Immature Granulocytes 0.01 0.00 - 0.07 K/uL    Comment: Performed at [facility], [address] [place], [place] [address]  Comprehensive metabolic panel     Status: Abnormal   Collection Time: 10/25/19 11:30 AM  Result Value Ref Range   Sodium 133 (L) 135 - 145 mmol/L    Comment: DELTA CHECK NOTED   Potassium 3.7 3.5 - 5.1 mmol/L   Chloride 98 98 - 111 mmol/L   CO2 23 22 - 32 mmol/L   Glucose, Bld 120 (H) 70 - 99 mg/dL    Comment: Glucose reference range applies only to samples taken after fasting for at least 8 hours.   BUN 14 6 - 20 mg/dL   Creatinine, Ser 4.400.94 0.61 - 1.24 mg/dL   Calcium 9.1 8.9 - 34.710.3 mg/dL   Total Protein 8.5 (H) 6.5 - 8.1 g/dL   Albumin 3.7 3.5 - 5.0 g/dL   AST 425100 (H) 15 - 41 U/L   ALT 82 (H) 0 - 44 U/L   Alkaline Phosphatase 70 38 - 126 U/L   Total Bilirubin 1.2 0.3 - 1.2 mg/dL   GFR calc non Af Amer >60 >60 mL/min   GFR calc Af Amer >60 >60 mL/min   Anion gap 12 5 - 15    Comment: Performed at [facility], [address] [place], [place] [address]  Glucose, capillary     Status: Abnormal   Collection Time: 10/26/19  9:40 AM  Result Value Ref Range   Glucose-Capillary 147 (H) 70 - 99 mg/dL    Comment: Glucose reference range applies only to samples taken after  fasting for at least 8 hours.    Blood Alcohol level:  Lab Results  Component Value Date   ETH 276 (H) 10/23/2019   ETH <10 10/03/2019    Metabolic Disorder Labs: Lab Results  Component Value Date   HGBA1C 7.1 (H) 10/23/2019   MPG 157.07 10/23/2019   MPG 142.72 10/03/2019   Lab Results  Component Value Date   PROLACTIN 12.2 10/03/2019   Lab Results  Component Value Date   CHOL 197 10/23/2019   TRIG 41 10/23/2019   HDL 89 10/23/2019   CHOLHDL 2.2 10/23/2019   VLDL 8 10/23/2019   LDLCALC 100 (H) 10/23/2019   LDLCALC 111 (H) 10/03/2019    Physical Findings: AIMS: Facial and Oral Movements Muscles of Facial Expression: None, normal Lips and Perioral Area: None, normal Jaw: None, normal Tongue: None,  normal,Extremity Movements Upper (arms, wrists, hands, fingers): None, normal Lower (legs, knees, ankles, toes): None, normal, Trunk Movements Neck, shoulders, hips: None, normal, Overall Severity Severity of abnormal movements (highest score from questions above): None, normal Incapacitation due to abnormal movements: None, normal Patient's awareness of abnormal movements (rate only patient's report): No Awareness, Dental Status Current problems with teeth and/or dentures?: No Does patient usually wear dentures?: No  CIWA:  CIWA-Ar Total: 3 COWS:  COWS Total Score: 5  Musculoskeletal: Strength & Muscle Tone: decreased Gait & Station: unsteady Patient leans: N/A  Psychiatric Specialty Exam: Physical Exam  Nursing note and vitals reviewed. HENT:  Head: Normocephalic.  Respiratory: Effort normal.  Neurological: He is alert.    Review of Systems  Blood pressure 115/89, pulse (!) 111, temperature 98 F (36.7 C), temperature source Oral, resp. rate 18, height 6\' 1"  (1.854 m), weight 64 kg, SpO2 100 %.Body mass index is 18.6 kg/m.  General Appearance: Disheveled  Eye Contact:  Fair  Speech:  Normal Rate  Volume:  Decreased  Mood:  Dysphoric  Affect:  Congruent  Thought Process:  Goal Directed and Descriptions of Associations: Circumstantial  Orientation:  Full (Time, Place, and Person)  Thought Content:  Delusions and Hallucinations: Auditory  Suicidal Thoughts:  No  Homicidal Thoughts:  No  Memory:  Immediate;   Poor Recent;   Poor Remote;   Poor  Judgement:  Impaired  Insight:  Lacking  Psychomotor Activity:  Decreased  Concentration:  Concentration: Poor and Attention Span: Poor  Recall:  Poor  Fund of Knowledge:  Poor  Language:  Fair  Akathisia:  Negative  Handed:  Right  AIMS (if indicated):     Assets:  Desire for Improvement Resilience  ADL's:  Impaired  Cognition:  Impaired,  Moderate  Sleep:  Number of Hours: 7.5     Treatment Plan Summary: Daily  contact with patient to assess and evaluate symptoms and progress in treatment, Medication management and Plan : Patient is seen and examined.  Patient is a 59 year old male with the above-stated past medical and psychiatric history who is seen in follow-up.   Diagnosis: #1 schizoaffective disorder, #2 alcohol dependence, #3 alcohol withdrawal (?),  #4 history of hepatitis C, #5 HIV, #6 urinary tract infection, #7 diabetes mellitus type 2, #8 thrombocytopenia, #9 urinary tract infection, #10 history of hypertension, #11 hyperlipidemia, #12 history of myocardial infarction (?)  Patient is seen in follow-up.  He does not look well medically or psychiatrically.  He was seen in the emergency room, and it does appear that he is getting urinary tract infection.  Given his diabetes and HIV I think it needs to  be treated.  He has no known drug allergies, and we will start Levaquin 5 mg p.o. daily.  We will do first dose now.  I will also check his pulse oximetry, get a chest x-ray if necessary.  We will restart his Metformin, but because his p.o. intake is not great we will just put him on a sliding scale for now.  I am also going to check an ammonia given the fact of his hepatitis C and the alcohol.  1 to make sure that he is not encephalopathic to any degree at least at this point.  He will continue the Ativan as needed for a CIWA greater than 10.  I will also add folic acid just in case.  He will continue his Biktarvy for his HIV disease.  He will remain on the Seroquel 200 mg p.o. nightly and has been on Abilify long-acting previously.  I will try to get the last time he got that injection.  1.  Continue Lipitor 40 mg p.o. every afternoon for hyperlipidemia. 2.  Continue Biktarvy 1 tablet p.o. daily for HIV disease. 3.  Add sliding scale insulin for diabetes mellitus, #4 continue loperamide 2 to 4 mg p.o. as needed diarrhea. 4.  Continue lorazepam 1 mg p.o. every 6 hours as needed a CIWA greater than 10. 5.   Rehab Metformin 500 mg p.o. twice daily with food for diabetes mellitus type 2. 6.  Continue multivitamin with minerals 1 tablet p.o. daily for nutritional supplementation. 3.  Continue Seroquel 200 mg p.o. nightly for psychosis. 8.  Continue thiamine 100 mg p.o. daily for nutritional supplementation. 9.  Continue folic acid 1 mg p.o. daily for nutritional supplementation. 10.  Add Levaquin 500 mg p.o. daily for urinary tract infection. 11.  Pulse oximetry with vital signs. 12.  We will order ammonia. 13.  Out of bed 3 times daily. 14.  Review medications for possible thrombocytopenia given the drop in his platelets. 15.  Disposition planning-in progress.  Antonieta Pert, MD 10/26/2019, 10:12 AM

## 2019-10-26 NOTE — Progress Notes (Signed)
   10/26/19 0900  Psych Admission Type (Psych Patients Only)  Admission Status Involuntary  Psychosocial Assessment  Patient Complaints None  Eye Contact Brief  Facial Expression Flat  Affect Appropriate to circumstance  Speech Logical/coherent  Interaction Minimal  Motor Activity Slow  Appearance/Hygiene Unremarkable  Behavior Characteristics Appropriate to situation  Mood Depressed  Aggressive Behavior  Effect No apparent injury  Thought Process  Coherency Concrete thinking  Content WDL  Delusions None reported or observed  Perception WDL  Hallucination None reported or observed  Judgment Poor  Confusion None  Danger to Self  Current suicidal ideation? Denies  Self-Injurious Behavior No self-injurious ideation or behavior indicators observed or expressed   Danger to Others  Danger to Others None reported or observed  Danger to Others Abnormal  Harmful Behavior to others No threats or harm toward other people  Destructive Behavior No threats or harm toward property

## 2019-10-26 NOTE — [facility] Group Notes (Signed)
  [facility] LCSW Group Therapy Note  Date/Time:  10/26/2019 11:15AM-12:00PM  Type of Therapy and Topic:  Group Therapy:  Feelings About Hospitalization  Participation Level:  Active   Description of Group This process group involved patients discussing their feelings related to being hospitalized, as well as the benefits they see to being in the hospital.  These feelings and benefits were itemized.  The group then brainstormed specific ways in which they could seek those same benefits when they discharge and return home.  Therapeutic Goals Patient will identify and describe positive and negative feelings related to hospitalization Patient will verbalize benefits of hospitalization to themselves personally Patients will brainstorm together ways they can obtain similar benefits in the outpatient setting, identify barriers to wellness and possible solutions  Summary of Patient Progress:  The patient expressed his primary feelings about being hospitalized are "optimistic."  He talked about the toxic environment he was in and this resulted in his health declining because he was drinking every day.  He knows that he faces a lot of challenges when he returns to his life, because that toxic environment will still be there.  He stated that one thing he needs to do to avoid the toxic environment taking him back to drinking is to attend AA and/or NA meetings.  We talked about this and taking his medicines to stay healthy in mind and body, and he was able to verbalize how this would make his life so much better.  Therapeutic Modalities Cognitive Behavioral Therapy Motivational Interviewing    Ambrose Mantle, LCSW 10/26/2019, 9:26 AM

## 2019-10-27 LAB — GLUCOSE, CAPILLARY
Glucose-Capillary: 114 mg/dL — ABNORMAL HIGH (ref 70–99)
Glucose-Capillary: 90 mg/dL (ref 70–99)
Glucose-Capillary: 132 mg/dL — ABNORMAL HIGH (ref 70–99)
Glucose-Capillary: 111 mg/dL — ABNORMAL HIGH (ref 70–99)

## 2019-10-27 MED ORDER — QUETIAPINE FUMARATE 200 MG PO TABS
200.0000 mg | ORAL_TABLET | Freq: Once | ORAL | Status: AC
  Administered 2019-10-27: 200 mg via ORAL

## 2019-10-27 NOTE — Progress Notes (Signed)
[facility] MD Progress Note  10/27/2019 10:06 AM Derrick Cooper  MRN:  [id number] Subjective:  Patient is a 59 year old male with a past psychiatric history significant for schizoaffective disorder who was admitted on 10/24/2019 under involuntary commitment.  The patient was found by police outside of the building screaming about vampires and asking the police to shoot him.  Objective: Patient is seen and examined.  Patient is a 59 year old male with the above-stated past psychiatric history as well as a past medical history significant for diabetes as well as HIV who is seen in follow-up.  He is feeling much better today.  He is up and around.  He is not complaining of any dizziness.  His blood sugar this morning is better at 114.  He is ammonia came back this morning at 36.  He was started on Levaquin yesterday for urinary tract infection.  His urine culture is pending.  He denied any auditory or visual hallucinations.  He denied any suicidal or homicidal ideation.  He is still tachycardic with a rate of 125 this morning.  His blood pressure is low normal at 90/70.  Blood sugar as per above.  His CIWA this a.m. was only 2.  He slept well last night.  He denied any side effects to his current medications.  Principal Problem: <principal problem not specified> Diagnosis: Active Problems:   Schizoaffective disorder, bipolar type (HCC)  Total Time spent with patient: 20 minutes  Past Psychiatric History: Admission H&P  Past Medical History:  Past Medical History:  Diagnosis Date  . Diabetes mellitus without complication (HCC)   . Hepatitis C   . HIV infection (HCC)   . Schizophrenia (HCC)   . Substance abuse (HCC)    History reviewed. No pertinent surgical history. Family History:  Family History  Problem Relation Age of Onset  . Breast cancer Mother   . Lung cancer Father   . Heart attack Father    Family Psychiatric  History: See admission H&P Social History:  Social History   Substance and  Sexual Activity  Alcohol Use Yes   Comment: 3-4 40oz beers a day     Social History   Substance and Sexual Activity  Drug Use Yes  . Types: Marijuana, Methamphetamines   Comment: has not used meth since 03/2017, marijuana once or twice a month    Social History   Socioeconomic History  . Marital status: Single    Spouse name: Not on file  . Number of children: 0  . Years of education: 9  . Highest education level: Not on file  Occupational History  . Not on file  Tobacco Use  . Smoking status: Current Every Day Smoker    Packs/day: 0.50    Years: 40.00    Pack years: 20.00    Types: Cigarettes  . Smokeless tobacco: Never Used  Substance and Sexual Activity  . Alcohol use: Yes    Comment: 3-4 40oz beers a day  . Drug use: Yes    Types: Marijuana, Methamphetamines    Comment: has not used meth since 03/2017, marijuana once or twice a month  . Sexual activity: Not Currently    Partners: Female    Comment: declined condoms 06/2019  Other Topics Concern  . Not on file  Social History Narrative  . Not on file   Social Determinants of Health   Financial Resource Strain:   . Difficulty of Paying Living Expenses:   Food Insecurity:   . Worried About Running  Out of Food in the Last Year:   . Ran Out of Food in the Last Year:   Transportation Needs:   . Lack of Transportation (Medical):   Marland Kitchen Lack of Transportation (Non-Medical):   Physical Activity:   . Days of Exercise per Week:   . Minutes of Exercise per Session:   Stress:   . Feeling of Stress :   Social Connections:   . Frequency of Communication with Friends and Family:   . Frequency of Social Gatherings with Friends and Family:   . Attends Religious Services:   . Active Member of Clubs or Organizations:   . Attends Banker Meetings:   Marland Kitchen Marital Status:    Additional Social History:                         Sleep: Good  Appetite:  Fair  Current Medications: Current  Facility-Administered Medications  Medication Dose Route Frequency Provider Last Rate Last Admin  . atorvastatin (LIPITOR) tablet 40 mg  40 mg Oral q1800 Malvin Johns, MD   40 mg at 10/26/19 1719  . bictegravir-emtricitabine-tenofovir AF (BIKTARVY) 50-200-25 MG per tablet 1 tablet  1 tablet Oral Daily Malvin Johns, MD   1 tablet at 10/27/19 [phone]  . feeding supplement (GLUCERNA SHAKE) (GLUCERNA SHAKE) liquid 237 mL  237 mL Oral TID BM Antonieta Pert, MD   237 mL at 10/27/19 0924  . hydrOXYzine (ATARAX/VISTARIL) tablet 25 mg  25 mg Oral Q6H PRN Cobos, Fernando A, MD      . insulin aspart (novoLOG) injection 0-9 Units  0-9 Units Subcutaneous TID WC Antonieta Pert, MD   1 Units at 10/26/19 1718  . levofloxacin (LEVAQUIN) tablet 500 mg  500 mg Oral Daily Antonieta Pert, MD   500 mg at 10/27/19 8676  . loperamide (IMODIUM) capsule 2-4 mg  2-4 mg Oral PRN Cobos, Rockey Situ, MD      . LORazepam (ATIVAN) tablet 1 mg  1 mg Oral Q6H PRN Cobos, Fernando A, MD      . metFORMIN (GLUCOPHAGE) tablet 500 mg  500 mg Oral BID WC Antonieta Pert, MD   500 mg at 10/27/19 0827  . multivitamin with minerals tablet 1 tablet  1 tablet Oral Daily Cobos, Rockey Situ, MD   1 tablet at 10/27/19 0827  . QUEtiapine (SEROQUEL) tablet 200 mg  200 mg Oral QHS Cobos, Rockey Situ, MD   200 mg at 10/26/19 2111  . thiamine tablet 100 mg  100 mg Oral Daily Cobos, Rockey Situ, MD   100 mg at 10/27/19 7209    Lab Results:  Results for orders placed or performed during the hospital encounter of 10/24/19 (from the past 48 hour(s))  CBG monitoring, ED     Status: Abnormal   Collection Time: 10/25/19 11:22 AM  Result Value Ref Range   Glucose-Capillary 131 (H) 70 - 99 mg/dL    Comment: Glucose reference range applies only to samples taken after fasting for at least 8 hours.  CBC with Differential     Status: Abnormal   Collection Time: 10/25/19 11:30 AM  Result Value Ref Range   WBC 4.9 4.0 - 10.5 K/uL   RBC 4.81 4.22 -  5.81 MIL/uL   Hemoglobin 14.8 13.0 - 17.0 g/dL   HCT 47.0 96.2 - 83.6 %   MCV 90.4 80.0 - 100.0 fL   MCH 30.8 26.0 - 34.0 pg   MCHC 34.0 30.0 -  36.0 g/dL   RDW 16.114.5 09.611.5 - 04.515.5 %   Platelets 103 (L) 150 - 400 K/uL    Comment: REPEATED TO VERIFY PLATELET COUNT CONFIRMED BY SMEAR SPECIMEN CHECKED FOR CLOTS Immature Platelet Fraction may be clinically indicated, consider ordering this additional test [id number]    nRBC 0.0 0.0 - 0.2 %   Neutrophils Relative % 58 %   Neutro Abs 2.8 1.7 - 7.7 K/uL   Lymphocytes Relative 31 %   Lymphs Abs 1.5 0.7 - 4.0 K/uL   Monocytes Relative 9 %   Monocytes Absolute 0.4 0.1 - 1.0 K/uL   Eosinophils Relative 1 %   Eosinophils Absolute 0.0 0.0 - 0.5 K/uL   Basophils Relative 1 %   Basophils Absolute 0.0 0.0 - 0.1 K/uL   Immature Granulocytes 0 %   Abs Immature Granulocytes 0.01 0.00 - 0.07 K/uL    Comment: Performed at [facility], [address] [place], [place] [address]  Comprehensive metabolic panel     Status: Abnormal   Collection Time: 10/25/19 11:30 AM  Result Value Ref Range   Sodium 133 (L) 135 - 145 mmol/L    Comment: DELTA CHECK NOTED   Potassium 3.7 3.5 - 5.1 mmol/L   Chloride 98 98 - 111 mmol/L   CO2 23 22 - 32 mmol/L   Glucose, Bld 120 (H) 70 - 99 mg/dL    Comment: Glucose reference range applies only to samples taken after fasting for at least 8 hours.   BUN 14 6 - 20 mg/dL   Creatinine, Ser 5.620.94 0.61 - 1.24 mg/dL   Calcium 9.1 8.9 - 13.010.3 mg/dL   Total Protein 8.5 (H) 6.5 - 8.1 g/dL   Albumin 3.7 3.5 - 5.0 g/dL   AST 865100 (H) 15 - 41 U/L   ALT 82 (H) 0 - 44 U/L   Alkaline Phosphatase 70 38 - 126 U/L   Total Bilirubin 1.2 0.3 - 1.2 mg/dL   GFR calc non Af Amer >60 >60 mL/min   GFR calc Af Amer >60 >60 mL/min   Anion gap 12 5 - 15    Comment: Performed at [facility], [address] [place], [place] [address]  Glucose, capillary     Status: Abnormal   Collection Time: 10/26/19  9:40  AM  Result Value Ref Range   Glucose-Capillary 147 (H) 70 - 99 mg/dL    Comment: Glucose reference range applies only to samples taken after fasting for at least 8 hours.  Glucose, capillary     Status: None   Collection Time: 10/26/19 12:05 PM  Result Value Ref Range   Glucose-Capillary 98 70 - 99 mg/dL    Comment: Glucose reference range applies only to samples taken after fasting for at least 8 hours.  Glucose, capillary     Status: Abnormal   Collection Time: 10/26/19  4:52 PM  Result Value Ref Range   Glucose-Capillary 129 (H) 70 - 99 mg/dL    Comment: Glucose reference range applies only to samples taken after fasting for at least 8 hours.  Ammonia     Status: Abnormal   Collection Time: 10/26/19  6:30 PM  Result Value Ref Range   Ammonia 36 (H) 9 - 35 umol/L    Comment: Performed at [facility], [address] [place], [place] [address]  Glucose, capillary     Status: None   Collection Time: 10/26/19  8:41 PM  Result Value Ref Range  Glucose-Capillary 87 70 - 99 mg/dL    Comment: Glucose reference range applies only to samples taken after fasting for at least 8 hours.  Glucose, capillary     Status: Abnormal   Collection Time: 10/27/19  6:17 AM  Result Value Ref Range   Glucose-Capillary 114 (H) 70 - 99 mg/dL    Comment: Glucose reference range applies only to samples taken after fasting for at least 8 hours.    Blood Alcohol level:  Lab Results  Component Value Date   ETH 276 (H) 10/23/2019   ETH <10 45/80/9983    Metabolic Disorder Labs: Lab Results  Component Value Date   HGBA1C 7.1 (H) 10/23/2019   MPG 157.07 10/23/2019   MPG 142.72 10/03/2019   Lab Results  Component Value Date   PROLACTIN 12.2 10/03/2019   Lab Results  Component Value Date   CHOL 197 10/23/2019   TRIG 41 10/23/2019   HDL 89 10/23/2019   CHOLHDL 2.2 10/23/2019   VLDL 8 10/23/2019   LDLCALC 100 (H) 10/23/2019   LDLCALC 111 (H) 10/03/2019    Physical  Findings: AIMS: Facial and Oral Movements Muscles of Facial Expression: None, normal Lips and Perioral Area: None, normal Jaw: None, normal Tongue: None, normal,Extremity Movements Upper (arms, wrists, hands, fingers): None, normal Lower (legs, knees, ankles, toes): None, normal, Trunk Movements Neck, shoulders, hips: None, normal, Overall Severity Severity of abnormal movements (highest score from questions above): None, normal Incapacitation due to abnormal movements: None, normal Patient's awareness of abnormal movements (rate only patient's report): No Awareness, Dental Status Current problems with teeth and/or dentures?: No Does patient usually wear dentures?: No  CIWA:  CIWA-Ar Total: 2 COWS:  COWS Total Score: 5  Musculoskeletal: Strength & Muscle Tone: within normal limits Gait & Station: normal Patient leans: N/A  Psychiatric Specialty Exam: Physical Exam  Nursing note and vitals reviewed. Constitutional: He is oriented to person, place, and time. He appears well-developed and well-nourished.  HENT:  Head: Normocephalic and atraumatic.  Respiratory: Effort normal.  Neurological: He is alert and oriented to person, place, and time.    Review of Systems  Blood pressure 90/70, pulse (!) 125, temperature 97.7 F (36.5 C), temperature source Oral, resp. rate 18, height 6\' 1"  (1.854 m), weight 64 kg, SpO2 99 %.Body mass index is 18.6 kg/m.  General Appearance: Disheveled  Eye Contact:  Good  Speech:  Normal Rate  Volume:  Decreased  Mood:  Euthymic  Affect:  Congruent  Thought Process:  Coherent and Descriptions of Associations: Intact  Orientation:  Full (Time, Place, and Person)  Thought Content:  Logical  Suicidal Thoughts:  No  Homicidal Thoughts:  No  Memory:  Immediate;   Fair Recent;   Fair Remote;   Fair  Judgement:  Intact  Insight:  Fair  Psychomotor Activity:  Decreased  Concentration:  Concentration: Good and Attention Span: Good  Recall:  Good   Fund of Knowledge:  Good  Language:  Good  Akathisia:  Negative  Handed:  Right  AIMS (if indicated):     Assets:  Desire for Improvement Resilience  ADL's:  Intact  Cognition:  WNL  Sleep:  Number of Hours: 7.5     Treatment Plan Summary: Daily contact with patient to assess and evaluate symptoms and progress in treatment, Medication management and Plan : Patient is seen and examined.  Patient is a 59 year old male with the above-stated past medical and psychiatric history who is seen in follow-up.   Diagnosis: #1  schizoaffective disorder, #2 alcohol dependence, #3 alcohol withdrawal (?),  #4 history of hepatitis C, #5 HIV, #6 urinary tract infection, #7 diabetes mellitus type 2, #8 thrombocytopenia, #9 urinary tract infection, #10 history of hypertension, #11 hyperlipidemia, #12 history of myocardial infarction (?)  Patient is seen in follow-up.  He looks much better today.  No change in his current medications.  He is requesting discharge, and he is not psychotic, homicidal or suicidal.  I asked him to wait till tomorrow.  He is going to contact his family, but he did say that even if he had no place to go he wanted to be discharged.  His blood sugar control is better his vital signs are stable although a bit tachycardic.  No change in his medications today.  1.  Continue Lipitor 40 mg p.o. every afternoon for hyperlipidemia. 2.  Continue Biktarvy 1 tablet p.o. daily for HIV disease. 3.  Continue sliding scale insulin for diabetes mellitus type 2. 4.  Continue loperamide 2 to 4 mg p.o. as needed diarrhea. 5.  Continue lorazepam 1 mg p.o. every 6 hours as needed a CIWA greater than 10. 6.  Continue Metformin 500 mg p.o. twice daily for diabetes mellitus type 2. 7.  Continue multivitamin 1 tablet p.o. daily for nutritional supplementation. 8.  Continue Seroquel 200 mg p.o. nightly for psychosis. 9.  Continue thiamine 100 mg p.o. daily for nutritional supplementation. 10.  Continue  folic acid 1 mg p.o. daily for nutritional supplementation. 11.  Continue Levaquin 500 mg p.o. daily for urinary tract infection for 5 more days. 12.  Patient will need referral to an outpatient clinic to assess thrombocytopenia after discharge. 13.  Disposition planning-in progress.  Antonieta Pert, MD 10/27/2019, 10:06 AM

## 2019-10-27 NOTE — Progress Notes (Signed)
Pt to nurse's station c/o inability to sleep. 1x order for increased seroquel obtained and administered. Monitoring continues. Linton Rump, RN, 10/27/2019 10:41 PM

## 2019-10-27 NOTE — Progress Notes (Signed)
DAR NOTE: Patient presents with calm affect and jovial  mood.  Denies pain, auditory and visual hallucinations.  Maintained on routine safety checks.  Medications given as prescribed.  Support and encouragement offered as needed. Pt stated he is feeling better and has put on some weight.  Denied SI/HI, will continue to monitor.

## 2019-10-27 NOTE — BHH Group Notes (Signed)
[facility] LCSW Group Therapy Note  Date/Time:  10/27/2019  11:00AM-12:00PM  Type of Therapy and Topic:  Group Therapy:  Music and Mood  Participation Level:  Active   Description of Group: In this process group, members listened to a variety of genres of music and identified that different types of music evoke different responses.  Patients were encouraged to identify music that was soothing for them and music that was energizing for them.  Patients discussed how this knowledge can help with wellness and recovery in various ways including managing depression and anxiety as well as encouraging healthy sleep habits.    Therapeutic Goals: Patients will explore the impact of different varieties of music on mood Patients will verbalize the thoughts they have when listening to different types of music Patients will identify music that is soothing to them as well as music that is energizing to them Patients will discuss how to use this knowledge to assist in maintaining wellness and recovery Patients will explore the use of music as a coping skill  Summary of Patient Progress:  At the beginning of group, patient expressed that he felt he was on an emotional roller coaster today.  His feelings ranged from confused to optimistic to depressed.  He might discharge tomorrow and does not feel ready for this.  At the end of group, patient expressed that he had learned during group what types of music were helpful and unhelpful to him and he wants to take that lesson with him into his future life. .    Therapeutic Modalities: Solution Focused Brief Therapy Activity   Ambrose Mantle, LCSW

## 2019-10-27 NOTE — Progress Notes (Signed)
Adult Psychoeducational Group Note  Date:  10/27/2019 Time:  12:51 AM  Group Topic/Focus:  Wrap-Up Group:   The focus of this group is to help patients review their daily goal of treatment and discuss progress on daily workbooks.  Participation Level:  Active  Participation Quality:  Appropriate  Affect:  Appropriate  Cognitive:  Appropriate  Insight: Appropriate  Engagement in Group:  Developing/Improving  Modes of Intervention:  Discussion  Additional Comments:  Pt stated his goal for today was to focus on his treatment plan. Pt stated he accomplished his goal today. Pt stated his relationship with his family needs to be improved. Pt stated he felt better about himself today. Pt rated his overall day a 9 out of 10. Pt stated his appetite was pretty good today. Pt stated his sleep last night was fair. Pt stated he was in no physical pain today.  Pt deny auditory or visual hallucinations. Pt denies thoughts of harming himself or others. Pt stated he would alert staff if anything changes.   Felipa Furnace 10/27/2019, 12:51 AM

## 2019-10-27 NOTE — Progress Notes (Signed)
Adult Psychoeducational Group Note  Date:  10/27/2019 Time:  8:45 PM  Group Topic/Focus:  Wrap-Up Group:   The focus of this group is to help patients review their daily goal of treatment and discuss progress on daily workbooks.  Participation Level:  Active  Participation Quality:  Appropriate  Affect:  Appropriate  Cognitive:  Appropriate  Insight: Appropriate  Engagement in Group:  Developing/Improving  Modes of Intervention:  Discussion  Additional Comments:  Pt stated his goal for today was to focus on his treatment plan and talk with his doctor about his medication issue. Pt stated he accomplished his goals today. Pt stated the doctor increasing his sleep medication was his main goal for today. Pt stated his relationship with his family needs to be improved. Pt stated he felt better about himself today. Pt rated his overall day a 10. Pt stated his appetite was pretty good today. Pt stated his sleep last night was poor. Pt stated he was in no physical pain today.  Pt deny auditory or visual hallucinations. Pt denies thoughts of harming himself or others. Pt stated he would alert staff if anything changes.   Felipa Furnace 10/27/2019, 8:45 PM

## 2019-10-27 NOTE — Plan of Care (Signed)
D: Patient in room only coming out for medications. Denies SI/HI/AVH and pain. A: Medications administered as prescribed. Support and encouragement provided as needed.  R: Patient remains safe on the unit. Will continue to monitor for safety and stability.     Problem: Education: Goal: Knowledge of [place] [facility] Education information/materials will improve Outcome: Progressing Goal: Emotional status will improve Outcome: Progressing Goal: Mental status will improve Outcome: Progressing Goal: Verbalization of understanding the information provided will improve Outcome: Progressing   Problem: Activity: Goal: Sleeping patterns will improve Outcome: Progressing   Problem: Coping: Goal: Ability to verbalize frustrations and anger appropriately will improve Outcome: Progressing Goal: Ability to demonstrate self-control will improve Outcome: Progressing   Problem: Health Behavior/Discharge Planning: Goal: Identification of resources available to assist in meeting health care needs will improve Outcome: Progressing Goal: Compliance with treatment plan for underlying cause of condition will improve Outcome: Progressing   Problem: Physical Regulation: Goal: Ability to maintain clinical measurements within normal limits will improve Outcome: Progressing   Problem: Safety: Goal: Periods of time without injury will increase Outcome: Progressing   Problem: Education: Goal: Ability to make informed decisions regarding treatment will improve Outcome: Progressing   Problem: Coping: Goal: Coping ability will improve Outcome: Progressing   Problem: Health Behavior/Discharge Planning: Goal: Identification of resources available to assist in meeting health care needs will improve Outcome: Progressing   Problem: Medication: Goal: Compliance with prescribed medication regimen will improve Outcome: Progressing   Problem: Self-Concept: Goal: Ability to disclose and discuss  suicidal ideas will improve Outcome: Progressing Goal: Will verbalize positive feelings about self Outcome: Progressing   Problem: Activity: Goal: Will verbalize the importance of balancing activity with adequate rest periods Outcome: Progressing   Problem: Education: Goal: Will be free of psychotic symptoms Outcome: Progressing Goal: Knowledge of the prescribed therapeutic regimen will improve Outcome: Progressing   Problem: Coping: Goal: Coping ability will improve Outcome: Progressing Goal: Will verbalize feelings Outcome: Progressing   Problem: Health Behavior/Discharge Planning: Goal: Compliance with prescribed medication regimen will improve Outcome: Progressing   Problem: Nutritional: Goal: Ability to achieve adequate nutritional intake will improve Outcome: Progressing   Problem: Role Relationship: Goal: Ability to communicate needs accurately will improve Outcome: Progressing Goal: Ability to interact with others will improve Outcome: Progressing   Problem: Safety: Goal: Ability to redirect hostility and anger into socially appropriate behaviors will improve Outcome: Progressing Goal: Ability to remain free from injury will improve Outcome: Progressing   Problem: Self-Care: Goal: Ability to participate in self-care as condition permits will improve Outcome: Progressing   Problem: Self-Concept: Goal: Will verbalize positive feelings about self Outcome: Progressing

## 2019-10-28 LAB — GLUCOSE, CAPILLARY: Glucose-Capillary: 119 mg/dL — ABNORMAL HIGH (ref 70–99)

## 2019-10-28 MED ORDER — METFORMIN HCL 500 MG PO TABS: 500.0000 mg | ORAL_TABLET | Freq: Two times a day (BID) | ORAL | 3 refills | Status: DC

## 2019-10-28 MED ORDER — LEVOFLOXACIN 500 MG PO TABS: 500.0000 mg | ORAL_TABLET | Freq: Every day | ORAL | 1 refills | Status: DC

## 2019-10-28 MED ORDER — QUETIAPINE FUMARATE 200 MG PO TABS: 200.0000 mg | ORAL_TABLET | Freq: Every day | ORAL | 2 refills | Status: DC

## 2019-10-28 NOTE — Plan of Care (Signed)
Pt discharged via safe ride transport this morning at 10:45am. Pt given discharge instructions upon discharge, given discharge med education and prescriptions, follow up appointment instructions, all personal belongings returned to pt upon discharge. Pt upon discharge denies suicidal and homicidal ideation, denies hallucinations, denies feelings of depression and anxiety, is calm, and cooperative with discharge process, is alert and oriented to person, place, time and situation. No distress noted or reported no complaints voiced.    Problem: Education: Goal: Knowledge of [place] [facility] Education information/materials will improve Outcome: Adequate for Discharge Goal: Emotional status will improve Outcome: Adequate for Discharge Goal: Mental status will improve Outcome: Adequate for Discharge Goal: Verbalization of understanding the information provided will improve Outcome: Adequate for Discharge   Problem: Activity: Goal: Interest or engagement in activities will improve Outcome: Adequate for Discharge Goal: Sleeping patterns will improve Outcome: Adequate for Discharge   Problem: Coping: Goal: Ability to verbalize frustrations and anger appropriately will improve Outcome: Adequate for Discharge Goal: Ability to demonstrate self-control will improve Outcome: Adequate for Discharge   Problem: Health Behavior/Discharge Planning: Goal: Identification of resources available to assist in meeting health care needs will improve Outcome: Adequate for Discharge Goal: Compliance with treatment plan for underlying cause of condition will improve Outcome: Adequate for Discharge   Problem: Physical Regulation: Goal: Ability to maintain clinical measurements within normal limits will improve Outcome: Adequate for Discharge   Problem: Safety: Goal: Periods of time without injury will increase Outcome: Adequate for Discharge   Problem: Education: Goal: Ability to make informed  decisions regarding treatment will improve Outcome: Adequate for Discharge   Problem: Coping: Goal: Coping ability will improve Outcome: Adequate for Discharge   Problem: Health Behavior/Discharge Planning: Goal: Identification of resources available to assist in meeting health care needs will improve Outcome: Adequate for Discharge   Problem: Medication: Goal: Compliance with prescribed medication regimen will improve Outcome: Adequate for Discharge   Problem: Self-Concept: Goal: Ability to disclose and discuss suicidal ideas will improve Outcome: Adequate for Discharge Goal: Will verbalize positive feelings about self Outcome: Adequate for Discharge   Problem: Activity: Goal: Will verbalize the importance of balancing activity with adequate rest periods Outcome: Adequate for Discharge   Problem: Education: Goal: Will be free of psychotic symptoms Outcome: Adequate for Discharge Goal: Knowledge of the prescribed therapeutic regimen will improve Outcome: Adequate for Discharge   Problem: Coping: Goal: Coping ability will improve Outcome: Adequate for Discharge Goal: Will verbalize feelings Outcome: Adequate for Discharge   Problem: Health Behavior/Discharge Planning: Goal: Compliance with prescribed medication regimen will improve Outcome: Adequate for Discharge   Problem: Nutritional: Goal: Ability to achieve adequate nutritional intake will improve Outcome: Adequate for Discharge   Problem: Role Relationship: Goal: Ability to communicate needs accurately will improve Outcome: Adequate for Discharge Goal: Ability to interact with others will improve Outcome: Adequate for Discharge   Problem: Safety: Goal: Ability to redirect hostility and anger into socially appropriate behaviors will improve Outcome: Adequate for Discharge Goal: Ability to remain free from injury will improve Outcome: Adequate for Discharge   Problem: Self-Care: Goal: Ability to participate  in self-care as condition permits will improve Outcome: Adequate for Discharge   Problem: Self-Concept: Goal: Will verbalize positive feelings about self Outcome: Adequate for Discharge

## 2019-10-28 NOTE — BHH Suicide Risk Assessment (Signed)
[facility] Discharge Suicide Risk Assessment   Principal Problem: Chronic homelessness probable malingering Discharge Diagnoses: Active Problems:   Schizoaffective disorder, bipolar type (HCC)   Total Time spent with patient: 45 minutes  Musculoskeletal: Strength & Muscle Tone: within normal limits Gait & Station: normal Patient leans: N/A  Psychiatric Specialty Exam: Physical Exam  Review of Systems  Blood pressure (!) 66/50, pulse (!) 108, temperature 97.6 F (36.4 C), temperature source Oral, resp. rate 18, height 6\' 1"  (1.854 m), weight 64 kg, SpO2 99 %.Body mass index is 18.6 kg/m.  General Appearance: Casual  Eye Contact:  Fair  Speech:  Normal Rate  Volume:  Normal  Mood:  Euthymic  Affect:  Appropriate and Blunt  Thought Process:  Goal Directed  Orientation:  Full (Time, Place, and Person)  Thought Content:  Logical  Suicidal Thoughts:  neg  Homicidal Thoughts:  No  Memory:  Immediate;   Fair Recent;   Fair Remote;   Fair  Judgement:  Fair  Insight:  Fair  Psychomotor Activity:  Normal  Concentration:  Concentration: Fair and Attention Span: Fair  Recall:  of Knowledge:  Fair  Language:  Fair  Akathisia:  Negative  Handed:  rt  AIMS (if indicated):     Assets:  Comm unication Skills Leisure Time Physical Health  ADL's:  Intact  Cognition:  WNL  Sleep:  Number of Hours: 5.5    Mental Status Per Nursing Assessment::   On Admission:  NA  Demographic Factors:  Male, Low socioeconomic status and Unemployed  Loss Factors: Decrease in vocational status  Historical Factors: NA  Risk Reduction Factors:   Religious beliefs about death  Continued Clinical Symptoms:  Previous Psychiatric Diagnoses and Treatments  Cognitive Features That Contribute To Risk:  Polarized thinking    Suicide Risk:  Minimal: No identifiable suicidal ideation.  Patients presenting with no risk factors but with morbid ruminations; may be classified as minimal risk  based on the severity of the depressive symptoms  Follow-up Information    [facility]. Go on 11/13/2019.   Why: You are scheduled for an appointment with 11/15/2019 on 11/13/19 @ 9:45 am.  This appointment will be held in person.   Contact information: [address] [place] [phone]        [facility] for Infectious Disease. Go on 10/29/2019.   Specialty: Infectious Diseases Why: You are scheduled for an appointment on 10/29/19 at 1:45 pm.  This appointment will be held in person.  Contact information: [address] [place], [address] [place] [phone]          Plan Of Care/Follow-up recommendations:  Activity:  full  Revel Stellmach, MD 10/28/2019, 8:01 AM

## 2019-10-28 NOTE — Discharge Summary (Signed)
Physician Discharge Summary Note  Patient:  Derrick Cooper is an 59 y.o., male MRN:  [id number] DOB:  [birth year] Patient phone:  [phone] (home)  Patient address:   [place] [address],  Total Time spent with patient: 45 minutes  Date of Admission:  10/24/2019 Date of Discharge: 3/29  Reason for Admission:   According to the HPI of 3/27 Derrick Cooper an 59 y.o.malepatient presents as walk in via law enforcement under IVC. Patient found by police outside yelling about vampires then asking police to shoot him. IVC was taking out by law enforcement and patient brought to the hospital. Unable to complete MSE related to acuity of patients condition. During assessment patient is disorganized, having auditory and visual hallucinations with delusional thoughts that vampires are following standing outside watching him through the window, and seeing bugs covering the floor.   I interviewed the patient he simply makes poor eye contact states he "sees vampires" when asked to elaborate he states "they are things" so once again I believe he is malingering due to homelessness but he has been admitted and we will evaluate him overnight. No Evidence of responding to stimuli no thoughts of harming self or others on this exam - no involuntary movements  Screen reflects abuse of cannabis and benzodiazepines  Principal Problem: <principal problem not specified> Discharge Diagnoses: Active Problems:   Schizoaffective disorder, bipolar type [facility])   Past Psychiatric History: Prior similar presentations case complicated by homelessness and chronic malingering  Past Medical History:  Past Medical History:  Diagnosis Date  . Diabetes mellitus without complication ([place])   . Hepatitis C   . HIV infection ([place])   . Schizophrenia ([place])   . Substance abuse ([place])    History reviewed. No pertinent surgical history. Family History:  Family History  Problem Relation Age  of Onset  . Breast cancer Mother   . Lung cancer Father   . Heart attack Father    Family Psychiatric  History: No new data shared Social History:  Social History   Substance and Sexual Activity  Alcohol Use Yes   Comment: 3-4 40oz beers a day     Social History   Substance and Sexual Activity  Drug Use Yes  . Types: Marijuana, Methamphetamines   Comment: has not used meth since 03/2017, marijuana once or twice a month    Social History   Socioeconomic History  . Marital status: Single    Spouse name: Not on file  . Number of children: 0  . Years of education: 9  . Highest education level: Not on file  Occupational History  . Not on file  Tobacco Use  . Smoking status: Current Every Day Smoker    Packs/day: 0.50    Years: 40.00    Pack years: 20.00    Types: Cigarettes  . Smokeless tobacco: Never Used  Substance and Sexual Activity  . Alcohol use: Yes    Comment: 3-4 40oz beers a day  . Drug use: Yes    Types: Marijuana, Methamphetamines    Comment: has not used meth since 03/2017, marijuana once or twice a month  . Sexual activity: Not Currently    Partners: Female    Comment: declined condoms 06/2019  Other Topics Concern  . Not on file  Social History Narrative  . Not on file   Social Determinants of Health   Financial Resource Strain:   . Difficulty of Paying Living Expenses:   Food Insecurity:   .  Worried About Programme researcher, broadcasting/film/video in the Last Year:   . Barista in the Last Year:   Transportation Needs:   . Freight forwarder (Medical):   Marland Kitchen Lack of Transportation (Non-Medical):   Physical Activity:   . Days of Exercise per Week:   . Minutes of Exercise per Session:   Stress:   . Feeling of Stress :   Social Connections:   . Frequency of Communication with Friends and Family:   . Frequency of Social Gatherings with Friends and Family:   . Attends Religious Services:   . Active Member of Clubs or Organizations:   . Attends Tax inspector Meetings:   Marland Kitchen Marital Status:     Hospital Course:   Patient was admitted under routine precautions displayed no dangerous behaviors here.  He continued to calm as far as the first couple days, tell me he saw "vampires" when asked to describe him he stated "they have fangs" and began to laugh a bit to himself so I believe he was simply malingering this His behavior was contained by the date of the 29th it was clear he had no acute psychosis and no new for further inpatient psychiatric care. He reported no thoughts of harming self or others was hopeful that we could find him housing but we did give him resources previously regarding shelters so forth  Physical Findings: AIMS: Facial and Oral Movements Muscles of Facial Expression: None, normal Lips and Perioral Area: None, normal Jaw: None, normal Tongue: None, normal,Extremity Movements Upper (arms, wrists, hands, fingers): None, normal Lower (legs, knees, ankles, toes): None, normal, Trunk Movements Neck, shoulders, hips: None, normal, Overall Severity Severity of abnormal movements (highest score from questions above): None, normal Incapacitation due to abnormal movements: None, normal Patient's awareness of abnormal movements (rate only patient's report): No Awareness, Dental Status Current problems with teeth and/or dentures?: No Does patient usually wear dentures?: No  CIWA:  CIWA-Ar Total: 0 COWS:  COWS Total Score: 5  Musculoskeletal: Strength & Muscle Tone: within normal limits Gait & Station: normal Patient leans: N/A  Psychiatric Specialty Exam: Physical Exam  Review of Systems  Blood pressure (!) 66/50, pulse (!) 108, temperature 97.6 F (36.4 C), temperature source Oral, resp. rate 18, height 6\' 1"  (1.854 m), weight 64 kg, SpO2 99 %.Body mass index is 18.6 kg/m.  General Appearance: Casual  Eye Contact:  Fair  Speech:  Normal Rate  Volume:  Normal  Mood:  Euthymic  Affect:  Appropriate and Blunt   Thought Process:  Goal Directed  Orientation:  Full (Time, Place, and Person)  Thought Content:  Logical  Suicidal Thoughts:  neg  Homicidal Thoughts:  No  Memory:  Immediate;   Fair Recent;   Fair Remote;   Fair  Judgement:  Fair  Insight:  Fair  Psychomotor Activity:  Normal  Concentration:  Concentration: Fair and Attention Span: Fair  Recall:  of Knowledge:  Fair  Language:  Fair  Akathisia:  Negative  Handed:  rt  AIMS (if indicated):     Assets:  Communication Skills Leisure Time Physical Health  ADL's:  Intact  Cognition:  WNL  Sleep:  Number of Hours: 5.5     Have you used any form of tobacco in the last 30 days? (Cigarettes, Smokeless Tobacco, Cigars, and/or Pipes): Yes  Has this patient used any form of tobacco in the last 30 days? (Cigarettes, Smokeless Tobacco, Cigars, and/or Pipes) Yes, No  Blood Alcohol level:  Lab Results  Component Value Date   ETH 276 (H) 10/23/2019   ETH <10 10/03/2019    Metabolic Disorder Labs:  Lab Results  Component Value Date   HGBA1C 7.1 (H) 10/23/2019   MPG 157.07 10/23/2019   MPG 142.72 10/03/2019   Lab Results  Component Value Date   PROLACTIN 12.2 10/03/2019   Lab Results  Component Value Date   CHOL 197 10/23/2019   TRIG 41 10/23/2019   HDL 89 10/23/2019   CHOLHDL 2.2 10/23/2019   VLDL 8 10/23/2019   LDLCALC 100 (H) 10/23/2019   LDLCALC 111 (H) 10/03/2019    See Psychiatric Specialty Exam and Suicide Risk Assessment completed by Attending Physician prior to discharge.  Discharge destination:  Home  Is patient on multiple antipsychotic therapies at discharge:  No   Has Patient had three or more failed trials of antipsychotic monotherapy by history:  No  Recommended Plan for Multiple Antipsychotic Therapies: NA   Allergies as of 10/28/2019   No Known Allergies     Medication List    STOP taking these medications   Abilify Maintena 400 MG Prsy prefilled syringe Generic drug:  ARIPiprazole ER     TAKE these medications     Indication  atorvastatin 40 MG tablet Commonly known as: LIPITOR Take 40 mg by mouth daily.  Indication: High Amount of Fats in the Blood   Biktarvy 50-200-25 MG Tabs tablet Generic drug: bictegravir-emtricitabine-tenofovir AF TAKE 1 TABLET BY MOUTH DAILY.  Indication: HIV Disease   levofloxacin 500 MG tablet Commonly known as: LEVAQUIN Take 1 tablet (500 mg total) by mouth daily.  Indication: Complicated Urinary Tract Infection   metFORMIN 500 MG tablet Commonly known as: GLUCOPHAGE Take 1 tablet (500 mg total) by mouth 2 (two) times daily with a meal.  Indication: Type 2 Diabetes   QUEtiapine 200 MG tablet Commonly known as: SEROQUEL Take 1 tablet (200 mg total) by mouth at bedtime. What changed:   medication strength  how much to take  Indication: Major Depressive Disorder, Manic Phase of Manic-Depression      Follow-up Information    [facility]. Go on 11/13/2019.   Why: You are scheduled for an appointment with Merlyn Albert on 11/13/19 @ 9:45 am.  This appointment will be held in person.   Contact information: [address] [place] [address] [phone]        [facility] for Infectious Disease. Go on 10/29/2019.   Specialty: Infectious Diseases Why: You are scheduled for an appointment on 10/29/19 at 1:45 pm.  This appointment will be held in person.  Contact information: [address] [place], [address] [id number] mc [place] [address] [phone]            SignedMalvin Johns, MD 10/28/2019, 7:57 AM

## 2019-10-28 NOTE — Progress Notes (Signed)
Recreation Therapy Notes  Date: 3.29.21 Time: 1000 Location: 500 Hall Dayroom  Group Topic: Coping Skills  Goal Area(s) Addresses:  Patient will identify positive coping skills. Patient will identify benefit of using coping skills.  Behavioral Response: Engaged  Intervention: Worksheet, pencils, white board, marker  Activity: Mind Map.  LRT and patients filled out the first 8 boxes (anger, sadness, depression, anxiety, arguments, self esteem, lack of coping skills and guilt) of the mind map together.  Patients were then given time to come up with at least 3 coping skills for each area identified.  The group would come back together and LRT would write the coping skills on the board.  Education: Pharmacologist, Building control surveyor.   Education Outcome: Acknowledges understanding/In group clarification offered/Needs additional education.   Clinical Observations/Feedback: Pt was active and expressed coping skills help to deal with situations.  Pt expressed most times people lean on using negative coping skills when things happen as opposed to using positive.  Pt identified some positive coping skills as walking, workout, read the bible, take medication, be willing to compromise, think positive, talk to a therapist and forgive yourself.     Caroll Rancher, LRT/CTRS    Caroll Rancher A 10/28/2019 11:16 AM

## 2019-10-28 NOTE — Progress Notes (Signed)
  [facility] Adult Case Management Discharge Plan :  Will you be returning to the same living situation after discharge:  No. Going to rent a room at a hotel. At discharge, do you have transportation home?: Yes,  Safe Transportation at 10:30am. Do you have the ability to pay for your medications: Yes,  has Medicaid. Release of information consent forms completed and in the chart. Letter on chart.  Patient to Follow up at: Follow-up Information    [facility]. Go on 11/13/2019.   Why: You are scheduled for an appointment with Merlyn Albert on 11/13/19 @ 9:45 am.  This appointment will be held in person.   Contact information: [address] [place] [address] [phone]        [facility] for Infectious Disease. Go on 10/29/2019.   Specialty: Infectious Diseases Why: You are scheduled for an appointment on 10/29/19 at 1:45 pm.  This appointment will be held in person.  Contact information: [address] [place], [address] [place] [id number] mc [place] [address] [phone]          Next level of care provider has access to [facility] Link:yes  Safety Planning and Suicide Prevention discussed: Yes,  with patient. Patient declined consents.  Have you used any form of tobacco in the last 30 days? (Cigarettes, Smokeless Tobacco, Cigars, and/or Pipes): Yes  Has patient been referred to the Quitline?: Patient refused referral  Patient has been referred for addiction treatment: Yes  Darreld Mclean, LCSWA 10/28/2019, 9:33 AM

## 2019-10-28 NOTE — Progress Notes (Signed)
Pt d/c home as ordered. Ambulatory with a steady gait. Appears to be in no physical distress at time of departure.

## 2019-10-29 ENCOUNTER — Ambulatory Visit: Payer: Medicaid Other | Admitting: Family

## 2019-10-29 LAB — URINE CULTURE: Culture: 40000 — AB

## 2019-11-04 ENCOUNTER — Telehealth: Payer: Self-pay | Admitting: *Deleted

## 2019-11-04 ENCOUNTER — Encounter: Payer: Self-pay | Admitting: *Deleted

## 2019-11-04 NOTE — Telephone Encounter (Signed)
Called [facility] Infectious Disease and made an appointment for April 7th 10:45.

## 2019-11-04 NOTE — Congregational Nurse Program (Signed)
  Dept: [phone]   Congregational Nurse Program Note  Date of Encounter: 11/04/2019  Past Medical History: Past Medical History:  Diagnosis Date  . Diabetes mellitus without complication (HCC)   . Hepatitis C   . HIV infection (HCC)   . Schizophrenia (HCC)   . Substance abuse [facility])     Encounter Details: CNP Questionnaire - 11/04/19 1239      Questionnaire   Patient Status  Not Applicable    Race  Black or African American    Location Patient Served At  [employer]    Uninsured  Uninsured (NEW 1x/quarter)    Food  Yes, have food insecurities    Housing/Utilities  No permanent housing    Transportation  Yes, need transportation assistance    Interpersonal Safety  No, do not feel physically and emotionally safe where you currently live    Medication  No medication insecurities    Medical Provider  Yes    Referrals  Behavioral/Mental Health Provider;Other   [place] Infectious Disease   ED Visit Averted  Not Applicable    Life-Saving Intervention Made  Not Applicable      Pt has been sleeping outside of [facility]. He had hospital papers, samples and prescriptions with him. Pt reports he has not been taking his medications because Seroquel makes him sleepy and he is concerned others will take his things. Pt says that he has voices and that he screamed with them last night. Discussed taking medications during the day when staff was nearby. Encouraged pt to followup with [facility]. Pt denies si and hi. Called [facility] Infectious Disease and made an appointment for April 7th at 10:45 as pt did not go to his last appointment in March. Will give bus pass Wednesday morning for appointment. Asked pt about housing and he says that he cannot get housing due to prior arson charges. Referred to [facility] to see if anything else available.

## 2019-11-06 ENCOUNTER — Telehealth: Payer: Self-pay | Admitting: *Deleted

## 2019-11-06 ENCOUNTER — Encounter: Payer: Self-pay | Admitting: *Deleted

## 2019-11-06 ENCOUNTER — Ambulatory Visit: Payer: Medicaid Other | Admitting: Pharmacist

## 2019-11-06 NOTE — Congregational Nurse Program (Signed)
  Dept: [phone]   Congregational Nurse Program Note  Date of Encounter: 11/06/2019  Past Medical History: Past Medical History:  Diagnosis Date  . Diabetes mellitus without complication (HCC)   . Hepatitis C   . HIV infection (HCC)   . Schizophrenia (HCC)   . Substance abuse [facility])     Encounter Details: CNP Questionnaire - 11/06/19 0825      Questionnaire   Patient Status  Not Applicable    Race  Black or African American    Location Patient Served At  [employer]    Uninsured  Not Applicable    Food  Yes, have food insecurities    Housing/Utilities  No permanent housing    Transportation  Yes, need transportation assistance    Interpersonal Safety  No, do not feel physically and emotionally safe where you currently live    Medication  No medication insecurities    Medical Provider  Yes    Referrals  Area Agency   bus passes given for [facility] infectious disease appt   ED Visit Averted  Not Applicable    Life-Saving Intervention Made  Not Applicable      Pt was sleeping outside [facility] this morning. Woke pt and reminded him of his 10:45 appointment with [facility] infectious disease. Pt had referral paper in his pocket that was given to him on Monday. Gave patient two bus passes for appointment. Waynetta Sandy RN CN [phone]

## 2019-11-06 NOTE — Telephone Encounter (Signed)
Called [facility] and they have beds available. They are faxing over intake forms.

## 2019-11-06 NOTE — Telephone Encounter (Signed)
Called 911 as pt was making vague suicide statements. Pt has been yelling and asked writer "Are you a vampire?"  Client refuses to take medication and did not go to appointment this morning after he was given bus passes to go.

## 2019-11-06 NOTE — Congregational Nurse Program (Signed)
  Dept: [phone]   Congregational Nurse Program Note  Date of Encounter: 11/06/2019  Past Medical History: Past Medical History:  Diagnosis Date  . Diabetes mellitus without complication (HCC)   . Hepatitis C   . HIV infection (HCC)   . Schizophrenia (HCC)   . Substance abuse [facility])     Encounter Details: CNP Questionnaire - 11/06/19 1446      Questionnaire   Patient Status  Not Applicable    Race  Black or African American    Location Patient Served At  [employer]    Uninsured  Not Applicable    Food  Yes, have food insecurities    Housing/Utilities  No permanent housing    Transportation  Yes, need transportation assistance    Interpersonal Safety  No, do not feel physically and emotionally safe where you currently live    Medication  No medication insecurities    Medical Provider  Yes    Referrals  Behavioral/Mental Health Provider    ED Visit Averted  Not Applicable    Life-Saving Intervention Made  Not Applicable      Client has been yelling and making vague suicide statements. He asked Clinical research associate "Are you a vampire?" 911 was called for safety and when police arrived client denied si. With encouragement client appeared to accept offer for help with a treatment center. When writer went to discuss RTS, he yelled and said he was not going to any treatment center. He then laid back down in front of [facility] on his blankets.

## 2019-11-06 NOTE — Telephone Encounter (Signed)
Called [facility] to see if any beds available and to see if he met criteria. With much encouragement pt reluctantly says he will go to treatment.

## 2019-11-25 ENCOUNTER — Encounter (HOSPITAL_COMMUNITY): Payer: Self-pay

## 2019-11-25 ENCOUNTER — Emergency Department (HOSPITAL_COMMUNITY)
Admission: EM | Admit: 2019-11-25 | Discharge: 2019-11-25 | Disposition: A | Discharge status: Home / Self Care | Payer: Medicaid Other | Attending: Emergency Medicine | Admitting: Emergency Medicine

## 2019-11-25 ENCOUNTER — Ambulatory Visit (HOSPITAL_COMMUNITY): Admission: AD | Admit: 2019-11-25 | Payer: Medicaid Other | Source: Home / Self Care | Admitting: Psychiatry

## 2019-11-25 ENCOUNTER — Other Ambulatory Visit: Payer: Self-pay

## 2019-11-25 DIAGNOSIS — B20 Human immunodeficiency virus [HIV] disease: Secondary | ICD-10-CM | POA: Diagnosis not present

## 2019-11-25 DIAGNOSIS — Z5321 Procedure and treatment not carried out due to patient leaving prior to being seen by health care provider: Secondary | ICD-10-CM | POA: Diagnosis not present

## 2019-11-25 DIAGNOSIS — E119 Type 2 diabetes mellitus without complications: Secondary | ICD-10-CM | POA: Diagnosis not present

## 2019-11-25 DIAGNOSIS — Z79899 Other long term (current) drug therapy: Secondary | ICD-10-CM | POA: Diagnosis not present

## 2019-11-25 DIAGNOSIS — F1910 Other psychoactive substance abuse, uncomplicated: Secondary | ICD-10-CM | POA: Diagnosis not present

## 2019-11-25 DIAGNOSIS — R440 Auditory hallucinations: Secondary | ICD-10-CM | POA: Insufficient documentation

## 2019-11-25 DIAGNOSIS — F1210 Cannabis abuse, uncomplicated: Secondary | ICD-10-CM | POA: Insufficient documentation

## 2019-11-25 DIAGNOSIS — F17210 Nicotine dependence, cigarettes, uncomplicated: Secondary | ICD-10-CM | POA: Diagnosis not present

## 2019-11-25 DIAGNOSIS — Z794 Long term (current) use of insulin: Secondary | ICD-10-CM | POA: Insufficient documentation

## 2019-11-25 NOTE — ED Triage Notes (Addendum)
Patient came into triage from [facility]. Patient came in with his case Production designer, theatre/television/film. Patient states he has 4 demons in his head and the demons are telling him to jump in front of a train. Patient also states that the demons are telling him to kill people.  Patient yells out cussing at the demons

## 2019-11-25 NOTE — ED Provider Notes (Signed)
[facility] DEPT Provider Note   CSN: [id number] Arrival date & time: 11/25/19  1146     History Chief Complaint  Patient presents with  . Medical Clearance  . hearing voices  . Suicidal    Derrick Cooper is a 59 y.o. male.  Patient is a 59 year old gentleman with past medical history of HIV, diabetes, hepatitis C, schizophrenia, homelessness, substance abuse presenting to the emergency department for auditory hallucinations.  Patient presents with his case manager who states that the patient has been having auditory hallucinations and talking about demons in his head which tell him to kill himself and hurt other people.  Patient endorses this.  He reports that he has not been taking his psych medications for at least 1 month and he has been smoking marijuana and drinking alcohol instead.  He reports that the demons constantly speak to him and tell him to jump in front of trains.        Past Medical History:  Diagnosis Date  . Diabetes mellitus without complication ([place])   . Hepatitis C   . HIV infection ([place])   . Schizophrenia ([place])   . Substance abuse [facility])     Patient Active Problem List   Diagnosis Date Noted  . MDD (major depressive disorder), recurrent episode, severe ([place]) 08/09/2018  . Schizoaffective disorder, bipolar type ([place])   . Health care maintenance 06/26/2018  . Tobacco use 06/26/2018  . Furuncle of right axilla 11/13/2017  . Hyperlipidemia associated with type 2 diabetes mellitus ([place]) 10/25/2017  . 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 HIV disease ([place]) 09/27/2017  . Chronic hepatitis C without hepatic coma ([place]) 09/27/2017  . Acute non-recurrent maxillary sinusitis 09/27/2017  . Schizophrenia ([place]) 09/07/2017  . Uncontrolled type 2 diabetes mellitus with microalbuminuria, with long-term current use of insulin ([place]) 09/07/2017    . Urinary incontinence 09/07/2017    History reviewed. No pertinent surgical history.     Family History  Problem Relation Age of Onset  . Breast cancer Mother   . Lung cancer Father   . Heart attack Father     Social History   Tobacco Use  . Smoking status: Current Every Day Smoker    Packs/day: 0.50    Years: 40.00    Pack years: 20.00    Types: Cigarettes  . Smokeless tobacco: Never Used  Substance Use Topics  . Alcohol use: Yes    Comment: 3-4 40oz beers a day  . Drug use: Yes    Types: Marijuana, Methamphetamines    Comment: has not used meth since 03/2017, marijuana once or twice a month    Home Medications Prior to Admission medications   Medication Sig Start Date End Date Taking? Authorizing Provider  atorvastatin (LIPITOR) 40 MG tablet Take 40 mg by mouth daily.  06/04/19   [provider]  BIKTARVY 50-200-25 MG TABS tablet TAKE 1 TABLET BY MOUTH DAILY. 07/11/19   Golden Circle, FNP  levofloxacin (LEVAQUIN) 500 MG tablet Take 1 tablet (500 mg total) by mouth daily. 10/28/19   Johnn Hai, MD  metFORMIN (GLUCOPHAGE) 500 MG tablet Take 1 tablet (500 mg total) by mouth 2 (two) times daily with a meal. 10/28/19   Johnn Hai, MD  QUEtiapine (SEROQUEL) 200 MG tablet Take 1 tablet (200 mg total) by mouth at bedtime. 10/28/19   Johnn Hai, MD    Allergies    Patient  has no known allergies.  Review of Systems   Review of Systems  Constitutional: Negative for chills and fever.  HENT: Negative for ear pain and sore throat.   Eyes: Negative for pain and visual disturbance.  Respiratory: Negative for cough and shortness of breath.   Cardiovascular: Negative for chest pain and palpitations.  Gastrointestinal: Negative for abdominal pain and vomiting.  Genitourinary: Negative for dysuria and hematuria.  Musculoskeletal: Negative for arthralgias and back pain.  Skin: Negative for color change and rash.  Neurological: Negative for seizures and syncope.   Psychiatric/Behavioral: Positive for behavioral problems, hallucinations and sleep disturbance. Negative for self-injury and suicidal ideas.  All other systems reviewed and are negative.   Physical Exam Updated Vital Signs BP 101/70 (BP Location: Left Arm)   Pulse (!) 109   Temp 98.5 F (36.9 C) (Oral)   Resp 16   Ht 6\' 2"  (1.88 m)   Wt 68 kg   SpO2 95%   BMI 19.26 kg/m   Physical Exam Vitals and nursing note reviewed.  Constitutional:      Appearance: Normal appearance.     Comments: Appears disheveled, mildly agitated  HENT:     Head: Normocephalic.  Eyes:     Conjunctiva/sclera: Conjunctivae normal.  Cardiovascular:     Rate and Rhythm: Normal rate and regular rhythm.  Pulmonary:     Effort: Pulmonary effort is normal.  Skin:    General: Skin is dry.  Neurological:     Mental Status: He is alert.  Psychiatric:        Attention and Perception: He perceives auditory hallucinations.        Mood and Affect: Affect is labile.        Speech: Speech normal.        Behavior: Behavior is hyperactive.     ED Results / Procedures / Treatments   Labs (all labs ordered are listed, but only abnormal results are displayed) Labs Reviewed  COMPREHENSIVE METABOLIC PANEL  ETHANOL  SALICYLATE LEVEL  ACETAMINOPHEN LEVEL  CBC  RAPID URINE DRUG SCREEN, HOSP PERFORMED    EKG None  Radiology No results found.  Procedures Procedures (including critical care time)  Medications Ordered in ED Medications - No data to display  ED Course  I have reviewed the triage vital signs and the nursing notes.  Pertinent labs & imaging results that were available during my care of the patient were reviewed by me and considered in my medical decision making (see chart for details).  Clinical Course as of Nov 24 1433  Mon Nov 25, 2019  1434 When I went to re-evaluate the patient there was  no one in a room. Per nurse note, the patient's case manager took the patient and left the ER  due to waiting too long.    [KM]    Clinical Course User Index [KM] Nov 27, 2019   MDM Rules/Calculators/A&P                       Final Clinical Impression(s) / ED Diagnoses Final diagnoses:  Auditory hallucination  Polysubstance abuse [facility])    Rx / DC Orders ED Discharge Orders    None       [facility] 11/25/19 1513    [place], 11/27/19, MD 11/25/19 [phone]

## 2019-11-25 NOTE — ED Notes (Signed)
Patient left out of triage with the Case Manager. Case manager said "It has been 4 hours and no one has seen him. Patient was in triage Room #4 2 hours and 7 mins.

## 2019-11-27 ENCOUNTER — Telehealth: Payer: Self-pay | Admitting: *Deleted

## 2019-11-27 ENCOUNTER — Encounter: Payer: Self-pay | Admitting: *Deleted

## 2019-11-27 NOTE — Congregational Nurse Program (Signed)
  Dept: [phone]   Congregational Nurse Program Note  Date of Encounter: 11/27/2019  Past Medical History: Past Medical History:  Diagnosis Date  . Diabetes mellitus without complication (HCC)   . Hepatitis C   . HIV infection (HCC)   . Schizophrenia (HCC)   . Substance abuse [facility])     Encounter Details: CNP Questionnaire - 11/27/19 1535      Questionnaire   Patient Status  Not Applicable    Race  Black or African American    Location Patient Served At  [employer]    Uninsured  Not Applicable    Food  Yes, have food insecurities    Housing/Utilities  No permanent housing    Transportation  Yes, need transportation assistance    Interpersonal Safety  No, do not feel physically and emotionally safe where you currently live    Medication  Yes, have medication insecurities   pt has medications but does not take them as prescribed   Medical Provider  Yes    Referrals  Not Applicable    ED Visit Averted  Not Applicable    Life-Saving Intervention Made  Not Applicable      Client stays outside of [facility] and comes inside during the day. He is often responding to internal stimuli and has been encouraged to followup with mental health services and take medications as  Prescribed. Client asked writer to check his blood sugar. CBG 211. He reports he has not taken his insulin in 15 days. He had a trash bag full of insulin pens. Found a paper [facility] parmacy listed. Contacted pharmacy and was told pt had an old prescription for Lantus 60 units daily. He had Lantus pens in his bag that was not expired. Told pt that pharmacy showed he was to be taking 60 units of Lantus daily. Again encouraged pt to take as prescribed.

## 2019-11-27 NOTE — Telephone Encounter (Signed)
Called pharmacy to verify insulin dosage. Per pharmacist Lantus 60 units daily.

## 2019-12-09 ENCOUNTER — Other Ambulatory Visit: Payer: Self-pay

## 2019-12-09 ENCOUNTER — Encounter (HOSPITAL_COMMUNITY): Payer: Self-pay

## 2019-12-09 ENCOUNTER — Emergency Department (HOSPITAL_COMMUNITY)
Admission: EM | Admit: 2019-12-09 | Discharge: 2019-12-10 | Disposition: A | Discharge status: Home / Self Care | Payer: Medicaid Other | Attending: Emergency Medicine | Admitting: Emergency Medicine

## 2019-12-09 ENCOUNTER — Encounter: Payer: Self-pay | Admitting: *Deleted

## 2019-12-09 DIAGNOSIS — F1510 Other stimulant abuse, uncomplicated: Secondary | ICD-10-CM | POA: Diagnosis not present

## 2019-12-09 DIAGNOSIS — B20 Human immunodeficiency virus [HIV] disease: Secondary | ICD-10-CM | POA: Diagnosis not present

## 2019-12-09 DIAGNOSIS — Z7984 Long term (current) use of oral hypoglycemic drugs: Secondary | ICD-10-CM | POA: Diagnosis not present

## 2019-12-09 DIAGNOSIS — N390 Urinary tract infection, site not specified: Secondary | ICD-10-CM | POA: Insufficient documentation

## 2019-12-09 DIAGNOSIS — F17210 Nicotine dependence, cigarettes, uncomplicated: Secondary | ICD-10-CM | POA: Insufficient documentation

## 2019-12-09 DIAGNOSIS — F1994 Other psychoactive substance use, unspecified with psychoactive substance-induced mood disorder: Secondary | ICD-10-CM | POA: Diagnosis present

## 2019-12-09 DIAGNOSIS — F23 Brief psychotic disorder: Secondary | ICD-10-CM | POA: Insufficient documentation

## 2019-12-09 DIAGNOSIS — R45851 Suicidal ideations: Secondary | ICD-10-CM | POA: Insufficient documentation

## 2019-12-09 DIAGNOSIS — Z5900 Homelessness unspecified: Secondary | ICD-10-CM

## 2019-12-09 DIAGNOSIS — Z20822 Contact with and (suspected) exposure to covid-19: Secondary | ICD-10-CM | POA: Insufficient documentation

## 2019-12-09 DIAGNOSIS — F1210 Cannabis abuse, uncomplicated: Secondary | ICD-10-CM | POA: Insufficient documentation

## 2019-12-09 DIAGNOSIS — F332 Major depressive disorder, recurrent severe without psychotic features: Secondary | ICD-10-CM | POA: Diagnosis present

## 2019-12-09 DIAGNOSIS — E1140 Type 2 diabetes mellitus with diabetic neuropathy, unspecified: Secondary | ICD-10-CM | POA: Diagnosis not present

## 2019-12-09 DIAGNOSIS — Z046 Encounter for general psychiatric examination, requested by authority: Secondary | ICD-10-CM | POA: Diagnosis present

## 2019-12-09 DIAGNOSIS — F209 Schizophrenia, unspecified: Secondary | ICD-10-CM | POA: Diagnosis present

## 2019-12-09 DIAGNOSIS — R030 Elevated blood-pressure reading, without diagnosis of hypertension: Secondary | ICD-10-CM | POA: Diagnosis not present

## 2019-12-09 DIAGNOSIS — F1910 Other psychoactive substance abuse, uncomplicated: Secondary | ICD-10-CM | POA: Diagnosis present

## 2019-12-09 DIAGNOSIS — Z79899 Other long term (current) drug therapy: Secondary | ICD-10-CM | POA: Diagnosis not present

## 2019-12-09 DIAGNOSIS — R440 Auditory hallucinations: Secondary | ICD-10-CM | POA: Insufficient documentation

## 2019-12-09 LAB — URINALYSIS, ROUTINE W REFLEX MICROSCOPIC
Bilirubin Urine: NEGATIVE
Glucose, UA: NEGATIVE mg/dL
Ketones, ur: NEGATIVE mg/dL
Nitrite: NEGATIVE
Protein, ur: 30 mg/dL — AB
Specific Gravity, Urine: 1.015 (ref 1.005–1.030)
pH: 6 (ref 5.0–8.0)

## 2019-12-09 LAB — CBC WITH DIFFERENTIAL/PLATELET
Abs Immature Granulocytes: 0.01 10*3/uL (ref 0.00–0.07)
Basophils Absolute: 0 10*3/uL (ref 0.0–0.1)
Basophils Relative: 1 %
Eosinophils Absolute: 0 10*3/uL (ref 0.0–0.5)
Eosinophils Relative: 1 %
HCT: 39.6 % (ref 39.0–52.0)
Hemoglobin: 13.1 g/dL (ref 13.0–17.0)
Immature Granulocytes: 0 %
Lymphocytes Relative: 46 %
Lymphs Abs: 2.1 10*3/uL (ref 0.7–4.0)
MCH: 31.4 pg (ref 26.0–34.0)
MCHC: 33.1 g/dL (ref 30.0–36.0)
MCV: 95 fL (ref 80.0–100.0)
Monocytes Absolute: 0.4 10*3/uL (ref 0.1–1.0)
Monocytes Relative: 9 %
Neutro Abs: 1.9 10*3/uL (ref 1.7–7.7)
Neutrophils Relative %: 43 %
Platelets: 139 10*3/uL — ABNORMAL LOW (ref 150–400)
RBC: 4.17 MIL/uL — ABNORMAL LOW (ref 4.22–5.81)
RDW: 14.8 % (ref 11.5–15.5)
WBC: 4.5 10*3/uL (ref 4.0–10.5)
nRBC: 0 % (ref 0.0–0.2)

## 2019-12-09 LAB — COMPREHENSIVE METABOLIC PANEL
ALT: 163 U/L — ABNORMAL HIGH (ref 0–44)
AST: 177 U/L — ABNORMAL HIGH (ref 15–41)
Albumin: 3.4 g/dL — ABNORMAL LOW (ref 3.5–5.0)
Alkaline Phosphatase: 56 U/L (ref 38–126)
Anion gap: 12 (ref 5–15)
BUN: 10 mg/dL (ref 6–20)
CO2: 25 mmol/L (ref 22–32)
Calcium: 8.6 mg/dL — ABNORMAL LOW (ref 8.9–10.3)
Chloride: 106 mmol/L (ref 98–111)
Creatinine, Ser: 1.09 mg/dL (ref 0.61–1.24)
GFR calc Af Amer: >60 mL/min (ref >60)
GFR calc non Af Amer: >60 mL/min (ref >60)
Glucose, Bld: 124 mg/dL — ABNORMAL HIGH (ref 70–99)
Potassium: 3.9 mmol/L (ref 3.5–5.1)
Sodium: 143 mmol/L (ref 135–145)
Total Bilirubin: 0.6 mg/dL (ref 0.3–1.2)
Total Protein: 8 g/dL (ref 6.5–8.1)

## 2019-12-09 LAB — RAPID URINE DRUG SCREEN, HOSP PERFORMED
Amphetamines: NOT DETECTED
Barbiturates: NOT DETECTED
Benzodiazepines: NOT DETECTED
Cocaine: NOT DETECTED
Opiates: NOT DETECTED
Tetrahydrocannabinol: POSITIVE — AB

## 2019-12-09 LAB — ETHANOL: Alcohol, Ethyl (B): 261 mg/dL — ABNORMAL HIGH (ref <10)

## 2019-12-09 MED ORDER — ZIPRASIDONE MESYLATE 20 MG IM SOLR
20.0000 mg | Freq: Once | INTRAMUSCULAR | Status: AC
  Administered 2019-12-09: 20 mg via INTRAMUSCULAR
  Filled 2019-12-09: qty 20

## 2019-12-09 NOTE — ED Notes (Signed)
Pt has 2 belongings bags. Pt has shoes, pants, shirt, hoodie and a wallet in his bags.

## 2019-12-09 NOTE — Congregational Nurse Program (Signed)
  Dept: [phone]   Congregational Nurse Program Note  Date of Encounter: 12/09/2019  Past Medical History: Past Medical History:  Diagnosis Date  . Diabetes mellitus without complication (HCC)   . Hepatitis C   . HIV infection (HCC)   . Schizophrenia (HCC)   . Substance abuse [facility])     Encounter Details: CNP Questionnaire - 12/09/19 1153      Questionnaire   Patient Status  Not Applicable    Race  Black or African American    Location Patient Served At  [employer]    Uninsured  Not Applicable    Food  Yes, have food insecurities    Housing/Utilities  No permanent housing    Transportation  Yes, need transportation assistance    Interpersonal Safety  No, do not feel physically and emotionally safe where you currently live    Medication  Yes, have medication insecurities   not taking as prescribed   Medical Provider  Yes    Referrals  Behavioral/Mental Health Provider    ED Visit Averted  Not Applicable    Life-Saving Intervention Made  Not Applicable      Pt has been drinking alcohol this morning and saying that he needs rehab. Talked with pt about [facility] and criteria for admission. Encouraged pt to go to [facility] as he did not go to previous appointment. Encouraged pt to take medications as ordered. Will continue to support and encourage client. Offered to assist client with residential program admission when ready.  Nira Conn. RN CN [phone]

## 2019-12-09 NOTE — ED Notes (Signed)
Pt sleeping.   2 belongings bags are in locker 31

## 2019-12-09 NOTE — ED Provider Notes (Signed)
[name] Provider Note   CSN: [id number] Arrival date & time: 12/09/19  1333     History Chief Complaint  Patient presents with  . Suicidal  . hearing voices    Derrick Cooper is a 59 y.o. male.  Patient is a 59 year old male with history of HIV disease, schizophrenia, hepatitis C. He is brought by police officers for evaluation of erratic behavior.  He is been experiencing auditory hallucinations.  He tells me that he has "4 demons in his head, each 1 is telling him to take the cops gun and shoot himself".  He reports being off of his psychiatric medications for the past month.  He is quite agitated and uncooperative.  He does admit to both drug and alcohol use.  The history is provided by the patient.       Past Medical History:  Diagnosis Date  . Diabetes mellitus without complication (HCC)   . Hepatitis C   . HIV infection (HCC)   . Schizophrenia (HCC)   . Substance abuse [facility])     Patient Active Problem List   Diagnosis Date Noted  . MDD (major depressive disorder), recurrent episode, severe (HCC) 08/09/2018  . Schizoaffective disorder, bipolar type (HCC)   . Health care maintenance 06/26/2018  . Tobacco use 06/26/2018  . Furuncle of right axilla 11/13/2017  . Hyperlipidemia associated with type 2 diabetes mellitus (HCC) 10/25/2017  . 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 HIV disease (HCC) 09/27/2017  . Chronic hepatitis C without hepatic coma (HCC) 09/27/2017  . Acute non-recurrent maxillary sinusitis 09/27/2017  . Schizophrenia (HCC) 09/07/2017  . Uncontrolled type 2 diabetes mellitus with microalbuminuria, with long-term current use of insulin (HCC) 09/07/2017  . Urinary incontinence 09/07/2017    History reviewed. No pertinent surgical history.     Family History  Problem Relation Age of Onset  . Breast cancer Mother     . Lung cancer Father   . Heart attack Father     Social History   Tobacco Use  . Smoking status: Current Every Day Smoker    Packs/day: 0.50    Years: 40.00    Pack years: 20.00    Types: Cigarettes  . Smokeless tobacco: Never Used  Substance Use Topics  . Alcohol use: Yes    Comment: 3-4 40oz beers a day  . Drug use: Yes    Types: Marijuana, Methamphetamines    Comment: has not used meth since 03/2017, marijuana once or twice a month    Home Medications Prior to Admission medications   Medication Sig Start Date End Date Taking? Authorizing Provider  atorvastatin (LIPITOR) 40 MG tablet Take 40 mg by mouth daily.  06/04/19   [provider]  BIKTARVY 50-200-25 MG TABS tablet TAKE 1 TABLET BY MOUTH DAILY. 07/11/19   Veryl Speak, FNP  levofloxacin (LEVAQUIN) 500 MG tablet Take 1 tablet (500 mg total) by mouth daily. 10/28/19   Malvin Johns, MD  metFORMIN (GLUCOPHAGE) 500 MG tablet Take 1 tablet (500 mg total) by mouth 2 (two) times daily with a meal. 10/28/19   Malvin Johns, MD  QUEtiapine (SEROQUEL) 200 MG tablet Take 1 tablet (200 mg total) by mouth at bedtime. 10/28/19   Malvin Johns, MD    Allergies    Patient has no known allergies.  Review of Systems   Review of Systems  All other systems reviewed and are negative.  Physical Exam Updated Vital Signs BP 113/90 (BP Location: Left Arm)   Pulse (!) 122   Temp 98.3 F (36.8 C) (Oral)   Resp 16   Ht 6\' 2"  (1.88 m)   Wt 68 kg   SpO2 95%   BMI 19.26 kg/m   Physical Exam Vitals and nursing note reviewed.  Constitutional:      General: He is not in acute distress.    Appearance: He is well-developed. He is not diaphoretic.  HENT:     Head: Normocephalic and atraumatic.  Cardiovascular:     Rate and Rhythm: Normal rate and regular rhythm.     Heart sounds: No murmur. No friction rub.  Pulmonary:     Effort: Pulmonary effort is normal. No respiratory distress.     Breath sounds: Normal breath  sounds. No wheezing or rales.  Abdominal:     General: Bowel sounds are normal. There is no distension.     Palpations: Abdomen is soft.     Tenderness: There is no abdominal tenderness.  Musculoskeletal:        General: Normal range of motion.     Cervical back: Normal range of motion and neck supple.  Skin:    General: Skin is warm and dry.  Neurological:     Mental Status: He is alert and oriented to person, place, and time.     Coordination: Coordination normal.  Psychiatric:        Attention and Perception: He perceives auditory hallucinations.        Mood and Affect: Affect is labile, angry and inappropriate.        Speech: Speech is rapid and pressured.        Behavior: Behavior is agitated and aggressive.        Thought Content: Thought content includes suicidal ideation. Thought content does not include homicidal ideation. Thought content includes suicidal plan. Thought content does not include homicidal plan.     ED Results / Procedures / Treatments   Labs (all labs ordered are listed, but only abnormal results are displayed) Labs Reviewed  CBC WITH DIFFERENTIAL/PLATELET  COMPREHENSIVE METABOLIC PANEL  ETHANOL  URINALYSIS, ROUTINE W REFLEX MICROSCOPIC  RAPID URINE DRUG SCREEN, HOSP PERFORMED    EKG None  Radiology No results found.  Procedures Procedures (including critical care time)  Medications Ordered in ED Medications  ziprasidone (GEODON) injection 20 mg (has no administration in time range)    ED Course  I have reviewed the triage vital signs and the nursing notes.  Pertinent labs & imaging results that were available during my care of the patient were reviewed by me and considered in my medical decision making (see chart for details).    MDM Rules/Calculators/A&P  Patient presents here agitated, combative, and threatening to take the policeman's gun and shoot himself in the head.  He is having auditory hallucinations with voices from demons  telling him to do this.  IVC paperwork was initiated by myself.  Patient to be evaluated by TTS who will assist in determining the final disposition.  I anticipate admission.  Final Clinical Impression(s) / ED Diagnoses Final diagnoses:  None    Rx / DC Orders ED Discharge Orders    None       Veryl Speak, MD 12/09/19 1416

## 2019-12-09 NOTE — ED Triage Notes (Addendum)
Patient brought in by [employer]. patient is hearing voices and hitting himself in the head. Patient has not been taking his medications. Patient repeatedly states that 4 voices are telling him to take the police officer's gun and shoot himself. Patient did come out of his chair and reach for the officer's gun while Clinical research associate in the room. [employer] cuffed both wrists.

## 2019-12-10 ENCOUNTER — Encounter (HOSPITAL_COMMUNITY): Payer: Self-pay | Admitting: Registered Nurse

## 2019-12-10 ENCOUNTER — Emergency Department (HOSPITAL_COMMUNITY): Payer: Medicaid Other

## 2019-12-10 DIAGNOSIS — F1910 Other psychoactive substance abuse, uncomplicated: Secondary | ICD-10-CM | POA: Diagnosis present

## 2019-12-10 DIAGNOSIS — Z5900 Homelessness unspecified: Secondary | ICD-10-CM

## 2019-12-10 DIAGNOSIS — F1994 Other psychoactive substance use, unspecified with psychoactive substance-induced mood disorder: Secondary | ICD-10-CM | POA: Diagnosis present

## 2019-12-10 LAB — SARS CORONAVIRUS 2 BY RT PCR (HOSPITAL ORDER, PERFORMED IN ~~LOC~~ HOSPITAL LAB): SARS Coronavirus 2: NEGATIVE

## 2019-12-10 MED ORDER — BENZONATATE 100 MG PO CAPS
100.0000 mg | ORAL_CAPSULE | Freq: Once | ORAL | Status: AC
  Administered 2019-12-10: 100 mg via ORAL
  Filled 2019-12-10: qty 1

## 2019-12-10 MED ORDER — CEPHALEXIN 500 MG PO CAPS
1000.0000 mg | ORAL_CAPSULE | Freq: Two times a day (BID) | ORAL | Status: DC
  Administered 2019-12-10: 09:00:00 1000 mg via ORAL
  Filled 2019-12-10: qty 2

## 2019-12-10 MED ORDER — CEPHALEXIN 500 MG PO CAPS: 1000.0000 mg | ORAL_CAPSULE | Freq: Two times a day (BID) | ORAL | 0 refills | Status: DC

## 2019-12-10 NOTE — ED Provider Notes (Signed)
Emergency Medicine Observation Re-evaluation Note  Derrick Cooper is a 59 y.o. male, seen on rounds today.  Pt initially presented to the ED for complaints of Suicidal and hearing voices Currently, the patient is awaiting [facility] evaluation.  Physical Exam  BP (!) 170/105 (BP Location: Right Arm)   Pulse 100   Temp 99.4 F (37.4 C) (Oral)   Resp 20   Ht 1.88 m (6\' 2" )   Wt 68 kg   SpO2 92%   BMI 19.26 kg/m  Physical Exam Alert, content, nad.  ED Course / MDM  EKG:    I have reviewed the labs performed to date as well as medications administered while in observation.  Recent changes in the last 24 hours include medical clearance labs. Possible uti on labs, will cx and tx pending cx.  Plan  Current plan is for [facility] evaluation this AM.  Patient is under full IVC at this time   [facility], MD 12/10/19 [phone]

## 2019-12-10 NOTE — TOC Initial Note (Signed)
Transition of Care [facility]) - Initial/Assessment Note    Patient Details  Name: Derrick Cooper MRN: [id number] Date of Birth: 09-10-1960  Transition of Care [facility]) CM/SW Contact:    Erenest Rasher, RN Phone Number: [phone] 12/10/2019, 2:24 PM  Clinical Narrative:                 TOC CM spoke to pt at bedside. States he is staying in front of [facility]. He is able to get meals each day. States he has stayed at [employer], and [employer] in [employer] in the past. He had his own boarding room apt but the landlord would not provide him with a TV so he left. He was paying $600 per month. States he receives a check ~$700 a month but has used funds to supply alcohol. States he does want to have his own place but declined by [employer] due to past criminal history. Pt was provided information from Peer Support. He will be followed by [facility] team to assist with transportation, housing, medical appointments and medications. Pt plans to go back to [facility] were he has his belongings and other meds. TOC CM walked pt to [employer] and gave him directions for bus. Pt was provided a bus pass. Received call back from [facility] and currently no male shelter beds. Provide pt with resources for [employer]. Pt states he currently without a phone. Gave permission to speak to his sister, Derrick Cooper.   Expected Discharge Plan: Homeless Shelter Barriers to Discharge: (S) No Barriers Identified   Patient Goals and CMS Choice        Expected Discharge Plan and Services Expected Discharge Plan: Homeless Shelter In-house Referral: Clinical Social Work Discharge Planning Services: CM Consult     Expected Discharge Date: 12/10/19                                    Prior Living Arrangements/Services   Lives with:: Other (Comment) Patient language and need for interpreter reviewed:: Yes        Need for Family Participation in Patient Care: No  (Comment) Care giver support system in place?: No (comment)   Criminal Activity/Legal Involvement Pertinent to Current Situation/Hospitalization: No - Comment as needed  Activities of Daily Living      Permission Sought/Granted Permission sought to share information with : Case Manager, Family Supports Permission granted to share information with : Yes, Verbal Permission Granted  Share Information with NAME: Derrick Cooper  Permission granted to share info w AGENCY: ACTT, pharmacy, [facility]  Permission granted to share info w Relationship: sister  Permission granted to share info w Contact Information: [phone]  Emotional Assessment Appearance:: Appears older than stated age Attitude/Demeanor/Rapport: Engaged Affect (typically observed): Accepting Orientation: : Oriented to Place, Oriented to  Time, Oriented to Self, Oriented to Situation Alcohol / Substance Use: Alcohol Use, Tobacco Use Psych Involvement: Yes (comment)  Admission diagnosis:  hearing voices Patient Active Problem List   Diagnosis Date Noted  . Polysubstance abuse ([place]) 12/10/2019  . Homelessness 12/10/2019  . Substance induced mood disorder ([place]) 12/10/2019  . MDD (major depressive disorder), recurrent episode, severe ([place]) 08/09/2018  . Schizoaffective disorder, bipolar type ([place])   . Health care maintenance 06/26/2018  . Tobacco use 06/26/2018  . Furuncle of right axilla 11/13/2017  . Hyperlipidemia associated with type 2 diabetes mellitus ([place]) 10/25/2017  .  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 HIV disease (HCC) 09/27/2017  . Chronic hepatitis C without hepatic coma (HCC) 09/27/2017  . Acute non-recurrent maxillary sinusitis 09/27/2017  . Schizophrenia (HCC) 09/07/2017  . Uncontrolled type 2 diabetes mellitus with microalbuminuria, with long-term current use of insulin (HCC) 09/07/2017  . Urinary incontinence  09/07/2017   PCP:  Bryon Lions PA-C Pharmacy:   [facility] - [place], [place] - [address] [place] [address] [place] [address] Phone: [phone] Fax: [phone]  [facility] - [place], [place] - [address] [place] [address] [place] [address] Phone: [phone] Fax: [phone]  [facility] [phone] - [place], [place] [address] AT [facility] [address] [facility] [address] [place] [address] Phone: [phone] Fax: [phone]     Social Determinants of Health (SDOH) Interventions    Readmission Risk Interventions No flowsheet data found.

## 2019-12-10 NOTE — Discharge Instructions (Signed)
For your mental health needs, you are advised to follow up with [facility].  Call them at your earliest opportunity to schedule an intake appointment:       [facility]      [address]      [place], [place] [address]      [phone]      Crisis number: [phone]  For your shelter needs, you are advised to contact [facility] at your earliest opportunity:       [facility]      [phone]  For other supportive services, contact [facility]:       [facility]      [address]      [place], [place] [address]      [phone] 

## 2019-12-10 NOTE — BHH Suicide Risk Assessment (Cosign Needed)
Suicide Risk Assessment  Discharge Assessment   [facility] Discharge Suicide Risk Assessment   Principal Problem: Substance induced mood disorder ([place]) Discharge Diagnoses: Principal Problem:   Substance induced mood disorder ([place]) Active Problems:   Schizophrenia ([place])   MDD (major depressive disorder), recurrent episode, severe (HCC)   Polysubstance abuse ([place])   Homelessness   Total Time spent with patient: 30 minutes  Musculoskeletal: Strength & Muscle Tone: within normal limits Gait & Station: normal Patient leans: N/A  Psychiatric Specialty Exam:   Blood pressure (!) 152/101, pulse 93, temperature 99.2 F (37.3 C), temperature source Oral, resp. rate 17, height 6\' 2"  (1.88 m), weight 68 kg, SpO2 91 %.Body mass index is 19.26 kg/m.  General Appearance: Casual  Eye Contact::  Good  Speech:  Blocked and Normal Rate409  Volume:  Normal  Mood:  "okay"  Affect:  Appropriate and Congruent  Thought Process:  Coherent, Goal Directed and Descriptions of Associations: Intact  Orientation:  Full (Time, Place, and Person)  Thought Content:  WDL  Suicidal Thoughts:  No  Homicidal Thoughts:  No  Memory:  Immediate;   Good Recent;   Good  Judgement:  Intact  Insight:  Present  Psychomotor Activity:  Normal  Concentration:  Good  Recall:  Good  Fund of Knowledge:Fair  Language: Good  Akathisia:  No  Handed:  Right  AIMS (if indicated):     Assets:  Communication Skills Desire for Improvement  Sleep:     Cognition: WNL  ADL's:  Intact   Mental Status Per Nursing Assessment::   On Admission:    Derrick Cooper, 59 y.o., male patient seen via tele psych by this provider, Dr. Dwyane Dee; and chart reviewed on 12/10/19.  On evaluation Derrick Cooper reports he has been off of his medications and would like to have them restarted.  States that he is also homeless and need housing information.  Recently moved here from [place] and was staying with family until an incident with family would not  elaborate.  Patient states he was hearing voices but he does not appear to be responding to internal or external stimuli.   During evaluation Derrick Cooper is alert/oriented x 4; calm/cooperative; and mood is congruent with affect.  He does not appear to be responding to internal/external stimuli or delusional thoughts.  Patient is able to answer questions without difficult, and respond appropriately with no hesitation.  Patient denies suicidal/self-harm/homicidal ideation, and paranoia.  Patient answered question appropriately.  Referral to peer support for substance use resources and SW to assist with housing and community services.   Demographic Factors:  Male, Low socioeconomic status and Unemployed  Loss Factors: NA  Historical Factors: Impulsivity  Risk Reduction Factors:   Religious beliefs about death  Continued Clinical Symptoms:  Alcohol/Substance Abuse/Dependencies Previous Psychiatric Diagnoses and Treatments  Cognitive Features That Contribute To Risk:  None    Suicide Risk:  Minimal: No identifiable suicidal ideation.  Patients presenting with no risk factors but with morbid ruminations; may be classified as minimal risk based on the severity of the depressive symptoms   Plan Of Care/Follow-up recommendations:  Activity:  As tolerated Diet:  Heart healthy   Disposition:  Psychiatrically cleared No evidence of imminent risk to self or others at present.   Patient does not meet criteria for psychiatric inpatient admission. Supportive therapy provided about ongoing stressors. Discussed crisis plan, support from social network, calling 911, coming to the Emergency Department, and calling Suicide Hotline.  Davonne Baby,  NP 12/10/2019, 1:29 PM

## 2019-12-10 NOTE — ED Notes (Signed)
Pt DCd off unit to home per provider. Pt calm, cooperative, no s/s of distress. DC information and resources given to and reviewed with pt, pt acknowledged understanding. Belongings given to pt. pt ambulatory off unit, escorted by SW.  Pt given bus pass and medication .

## 2019-12-10 NOTE — Patient Outreach (Signed)
CPSS met with Pt an was able to process with him about what services CPSS can assist Pt with. CPSS was made aware that Pt is seeking housing an that he wants to get stable on his medications. CPSS addressed the fact that he ACTT services would be a great way for Pt to get himself back stable in the community.  CPSS was able to get a consent for signed as well as having him set up for an assessment with ACTT services. CPSS left contact information for Pt to stay in contact with CPSS in the community.

## 2019-12-10 NOTE — Discharge Instructions (Addendum)
It was our pleasure to provide your ER care today - we hope that you feel better.  Your lab tests show a possible urine infection - take antibiotic (keflex) as prescribed. A urine culture was sent the results of which should be back in 2 days time, have your doctor follow up on that result then.   Drink plenty of fluids.   Use behavioral health resources provided by the behavioral health team.  Follow up with primary care doctor in the coming week - also have your blood pressure rechecked then, as it is high today.   For mental health issues and/or crisis, you may go directly to the [facility].   Return to ER if worse, new symptoms, fevers, new or severe pain, persistent vomiting, trouble breathing, or other concern.

## 2019-12-10 NOTE — BH Assessment (Signed)
BHH Assessment Progress Note  Per Nelly Rout, MD, this pt does not require psychiatric hospitalization at this time.  Pt presents under IVC initiated by pt's case manager which Dr Lucianne Muss has rescinded.  Pt is to be discharged from [facility] with recommendation to follow up with [facility].  This has been included in pt's discharge instructions, along with area supportive services for the homeless.  Pt would also benefit from seeing Peer Support Specialists, and a peer support consult has been ordered for pt.  Pt's nurse, Waynetta Sandy, has been notified.  Doylene Canning, MA Triage Specialist [phone]

## 2019-12-11 ENCOUNTER — Encounter: Payer: Self-pay | Admitting: *Deleted

## 2019-12-11 NOTE — Congregational Nurse Program (Signed)
  Dept: [phone]   Congregational Nurse Program Note  Date of Encounter: 12/11/2019  Past Medical History: Past Medical History:  Diagnosis Date  . Diabetes mellitus without complication (HCC)   . Hepatitis C   . HIV infection (HCC)   . Schizophrenia (HCC)   . Substance abuse [facility])     Encounter Details: CNP Questionnaire - 12/11/19 1409      Questionnaire   Patient Status  Not Applicable    Race  Black or African American    Location Patient Served At  [employer]    Uninsured  Not Applicable    Food  Yes, have food insecurities    Housing/Utilities  No permanent housing    Transportation  Yes, need transportation assistance    Interpersonal Safety  No, do not feel physically and emotionally safe where you currently live    Medication  No medication insecurities    Medical Provider  Yes    Referrals  Not Applicable    ED Visit Averted  Not Applicable    Life-Saving Intervention Made  Not Applicable      Pt came in [facility] and had his blood pressure checked 93/65. Encouraged fluids and educated on fall risk safety. Client reports he took his medication this morning and plans to take his HS medication tonight. He says that he is suppose to meet an ACT team member and talking about starting a long acting injection for his mental health needs. Shaketa Serafin W. RN [phone]

## 2019-12-12 LAB — URINE CULTURE: Culture: >=100000 — AB

## 2019-12-13 ENCOUNTER — Encounter: Payer: Self-pay | Admitting: *Deleted

## 2019-12-13 NOTE — Congregational Nurse Program (Signed)
  Dept: [phone]   Congregational Nurse Program Note  Date of Encounter: 12/13/2019  Past Medical History: Past Medical History:  Diagnosis Date  . Diabetes mellitus without complication (HCC)   . Hepatitis C   . HIV infection (HCC)   . Schizophrenia (HCC)   . Substance abuse [facility])     Encounter Details: CNP Questionnaire - 12/13/19 1248      Questionnaire   Patient Status  Not Applicable    Race  Black or African American    Location Patient Served At  [employer]    Uninsured  Not Applicable    Food  Yes, have food insecurities    Housing/Utilities  No permanent housing    Transportation  Yes, need transportation assistance    Interpersonal Safety  No, do not feel physically and emotionally safe where you currently live    Medication  No medication insecurities    Medical Provider  Yes    Referrals  Not Applicable    ED Visit Averted  Not Applicable    Life-Saving Intervention Made  Not Applicable      Pt seen at [facility] and requested to have his blood pressure and blood sugar checked. Vitals 98/71 pulse 80, cbg 122. Pt reports he is not drinking while taking an antibiotic. He reports he is taking his medications as prescribed. Support and encouragement given.

## 2019-12-23 ENCOUNTER — Encounter: Payer: Self-pay | Admitting: *Deleted

## 2019-12-23 NOTE — Congregational Nurse Program (Signed)
  Dept: [phone]   Congregational Nurse Program Note  Date of Encounter: 12/23/2019  Past Medical History: Past Medical History:  Diagnosis Date  . Diabetes mellitus without complication (HCC)   . Hepatitis C   . HIV infection (HCC)   . Schizophrenia (HCC)   . Substance abuse [facility])     Encounter Details: CNP Questionnaire - 12/23/19 1344      Questionnaire   Patient Status  Not Applicable    Race  Black or African American    Location Patient Served At  [employer]    Uninsured  Not Applicable    Food  Yes, have food insecurities    Housing/Utilities  No permanent housing    Transportation  Yes, need transportation assistance    Interpersonal Safety  No, do not feel physically and emotionally safe where you currently live    Medication  No medication insecurities    Medical Provider  Yes    Referrals  Not Applicable    ED Visit Averted  Not Applicable    Life-Saving Intervention Made  Not Applicable      Client asked to have his blood sugar checked while at [facility]. Encouraged client to take his medications as ordered.

## 2019-12-26 ENCOUNTER — Inpatient Hospital Stay (HOSPITAL_COMMUNITY)
Admission: EM | Admit: 2019-12-26 | Discharge: 2020-01-06 | DRG: 853 | Disposition: A | Discharge status: Home / Self Care | Payer: Medicaid Other | Attending: Internal Medicine | Admitting: Internal Medicine

## 2019-12-26 ENCOUNTER — Encounter (HOSPITAL_COMMUNITY): Payer: Self-pay

## 2019-12-26 ENCOUNTER — Other Ambulatory Visit: Payer: Self-pay

## 2019-12-26 DIAGNOSIS — Z5900 Homelessness unspecified: Secondary | ICD-10-CM

## 2019-12-26 DIAGNOSIS — S02652A Fracture of angle of left mandible, initial encounter for closed fracture: Secondary | ICD-10-CM | POA: Diagnosis present

## 2019-12-26 DIAGNOSIS — D6959 Other secondary thrombocytopenia: Secondary | ICD-10-CM | POA: Diagnosis present

## 2019-12-26 DIAGNOSIS — E1169 Type 2 diabetes mellitus with other specified complication: Secondary | ICD-10-CM | POA: Diagnosis present

## 2019-12-26 DIAGNOSIS — B370 Candidal stomatitis: Secondary | ICD-10-CM | POA: Diagnosis present

## 2019-12-26 DIAGNOSIS — Z20822 Contact with and (suspected) exposure to covid-19: Secondary | ICD-10-CM | POA: Diagnosis present

## 2019-12-26 DIAGNOSIS — F1910 Other psychoactive substance abuse, uncomplicated: Secondary | ICD-10-CM | POA: Diagnosis present

## 2019-12-26 DIAGNOSIS — Z91128 Patient's intentional underdosing of medication regimen for other reason: Secondary | ICD-10-CM

## 2019-12-26 DIAGNOSIS — T50916A Underdosing of multiple unspecified drugs, medicaments and biological substances, initial encounter: Secondary | ICD-10-CM | POA: Diagnosis present

## 2019-12-26 DIAGNOSIS — F200 Paranoid schizophrenia: Secondary | ICD-10-CM | POA: Diagnosis present

## 2019-12-26 DIAGNOSIS — E118 Type 2 diabetes mellitus with unspecified complications: Secondary | ICD-10-CM | POA: Diagnosis present

## 2019-12-26 DIAGNOSIS — J180 Bronchopneumonia, unspecified organism: Secondary | ICD-10-CM | POA: Diagnosis present

## 2019-12-26 DIAGNOSIS — A419 Sepsis, unspecified organism: Principal | ICD-10-CM | POA: Diagnosis present

## 2019-12-26 DIAGNOSIS — E785 Hyperlipidemia, unspecified: Secondary | ICD-10-CM | POA: Diagnosis present

## 2019-12-26 DIAGNOSIS — B20 Human immunodeficiency virus [HIV] disease: Secondary | ICD-10-CM

## 2019-12-26 DIAGNOSIS — F1010 Alcohol abuse, uncomplicated: Secondary | ICD-10-CM

## 2019-12-26 DIAGNOSIS — K709 Alcoholic liver disease, unspecified: Secondary | ICD-10-CM

## 2019-12-26 DIAGNOSIS — F1210 Cannabis abuse, uncomplicated: Secondary | ICD-10-CM | POA: Diagnosis present

## 2019-12-26 DIAGNOSIS — F10229 Alcohol dependence with intoxication, unspecified: Secondary | ICD-10-CM | POA: Diagnosis present

## 2019-12-26 DIAGNOSIS — F17210 Nicotine dependence, cigarettes, uncomplicated: Secondary | ICD-10-CM | POA: Diagnosis present

## 2019-12-26 DIAGNOSIS — K029 Dental caries, unspecified: Secondary | ICD-10-CM | POA: Diagnosis present

## 2019-12-26 DIAGNOSIS — Y908 Blood alcohol level of 240 mg/100 ml or more: Secondary | ICD-10-CM | POA: Diagnosis present

## 2019-12-26 DIAGNOSIS — T1490XA Injury, unspecified, initial encounter: Secondary | ICD-10-CM

## 2019-12-26 DIAGNOSIS — F209 Schizophrenia, unspecified: Secondary | ICD-10-CM | POA: Diagnosis present

## 2019-12-26 DIAGNOSIS — Z720 Tobacco use: Secondary | ICD-10-CM | POA: Diagnosis present

## 2019-12-26 DIAGNOSIS — R0602 Shortness of breath: Secondary | ICD-10-CM

## 2019-12-26 DIAGNOSIS — J9601 Acute respiratory failure with hypoxia: Secondary | ICD-10-CM | POA: Diagnosis present

## 2019-12-26 DIAGNOSIS — Z794 Long term (current) use of insulin: Secondary | ICD-10-CM

## 2019-12-26 DIAGNOSIS — J189 Pneumonia, unspecified organism: Secondary | ICD-10-CM

## 2019-12-26 DIAGNOSIS — J181 Lobar pneumonia, unspecified organism: Secondary | ICD-10-CM | POA: Diagnosis present

## 2019-12-26 DIAGNOSIS — Z21 Asymptomatic human immunodeficiency virus [HIV] infection status: Secondary | ICD-10-CM

## 2019-12-26 DIAGNOSIS — S02609A Fracture of mandible, unspecified, initial encounter for closed fracture: Secondary | ICD-10-CM

## 2019-12-26 DIAGNOSIS — F1410 Cocaine abuse, uncomplicated: Secondary | ICD-10-CM | POA: Diagnosis present

## 2019-12-26 DIAGNOSIS — Z79899 Other long term (current) drug therapy: Secondary | ICD-10-CM

## 2019-12-26 LAB — COMPREHENSIVE METABOLIC PANEL
ALT: 81 U/L — ABNORMAL HIGH (ref 0–44)
AST: 98 U/L — ABNORMAL HIGH (ref 15–41)
Albumin: 3.2 g/dL — ABNORMAL LOW (ref 3.5–5.0)
Alkaline Phosphatase: 74 U/L (ref 38–126)
Anion gap: 11 (ref 5–15)
BUN: 13 mg/dL (ref 6–20)
CO2: 28 mmol/L (ref 22–32)
Calcium: 8.7 mg/dL — ABNORMAL LOW (ref 8.9–10.3)
Chloride: 97 mmol/L — ABNORMAL LOW (ref 98–111)
Creatinine, Ser: 0.9 mg/dL (ref 0.61–1.24)
GFR calc Af Amer: >60 mL/min (ref >60)
GFR calc non Af Amer: >60 mL/min (ref >60)
Glucose, Bld: 146 mg/dL — ABNORMAL HIGH (ref 70–99)
Potassium: 3.8 mmol/L (ref 3.5–5.1)
Sodium: 136 mmol/L (ref 135–145)
Total Bilirubin: 0.5 mg/dL (ref 0.3–1.2)
Total Protein: 8.7 g/dL — ABNORMAL HIGH (ref 6.5–8.1)

## 2019-12-26 LAB — CBC
HCT: 37.8 % — ABNORMAL LOW (ref 39.0–52.0)
Hemoglobin: 12.7 g/dL — ABNORMAL LOW (ref 13.0–17.0)
MCH: 31.5 pg (ref 26.0–34.0)
MCHC: 33.6 g/dL (ref 30.0–36.0)
MCV: 93.8 fL (ref 80.0–100.0)
Platelets: 125 10*3/uL — ABNORMAL LOW (ref 150–400)
RBC: 4.03 MIL/uL — ABNORMAL LOW (ref 4.22–5.81)
RDW: 13.5 % (ref 11.5–15.5)
WBC: 6.6 10*3/uL (ref 4.0–10.5)
nRBC: 0 % (ref 0.0–0.2)

## 2019-12-26 LAB — ETHANOL: Alcohol, Ethyl (B): 404 mg/dL (ref <10)

## 2019-12-26 NOTE — ED Notes (Signed)
ETOH report of 404 taken at sort by Deliah Boston.

## 2019-12-26 NOTE — ED Triage Notes (Signed)
Per [facility] pt [place] today found on side of the road, ETOH on board, auditory and visual hallucinations.   5mg  Haldol IM en route   BP 122/78 HR 100 CBG 126 94% RA

## 2019-12-27 ENCOUNTER — Emergency Department (HOSPITAL_COMMUNITY): Payer: Medicaid Other

## 2019-12-27 ENCOUNTER — Encounter (HOSPITAL_COMMUNITY): Payer: Self-pay | Admitting: Emergency Medicine

## 2019-12-27 DIAGNOSIS — F10229 Alcohol dependence with intoxication, unspecified: Secondary | ICD-10-CM | POA: Diagnosis present

## 2019-12-27 DIAGNOSIS — F1410 Cocaine abuse, uncomplicated: Secondary | ICD-10-CM | POA: Diagnosis present

## 2019-12-27 DIAGNOSIS — Z91128 Patient's intentional underdosing of medication regimen for other reason: Secondary | ICD-10-CM | POA: Diagnosis not present

## 2019-12-27 DIAGNOSIS — F1210 Cannabis abuse, uncomplicated: Secondary | ICD-10-CM | POA: Diagnosis present

## 2019-12-27 DIAGNOSIS — J180 Bronchopneumonia, unspecified organism: Secondary | ICD-10-CM | POA: Diagnosis present

## 2019-12-27 DIAGNOSIS — J189 Pneumonia, unspecified organism: Secondary | ICD-10-CM | POA: Diagnosis not present

## 2019-12-27 DIAGNOSIS — S02600A Fracture of unspecified part of body of mandible, initial encounter for closed fracture: Secondary | ICD-10-CM | POA: Diagnosis not present

## 2019-12-27 DIAGNOSIS — F200 Paranoid schizophrenia: Secondary | ICD-10-CM | POA: Diagnosis present

## 2019-12-27 DIAGNOSIS — Z21 Asymptomatic human immunodeficiency virus [HIV] infection status: Secondary | ICD-10-CM | POA: Diagnosis present

## 2019-12-27 DIAGNOSIS — T50916A Underdosing of multiple unspecified drugs, medicaments and biological substances, initial encounter: Secondary | ICD-10-CM | POA: Diagnosis present

## 2019-12-27 DIAGNOSIS — F10129 Alcohol abuse with intoxication, unspecified: Secondary | ICD-10-CM | POA: Diagnosis not present

## 2019-12-27 DIAGNOSIS — J181 Lobar pneumonia, unspecified organism: Secondary | ICD-10-CM | POA: Diagnosis present

## 2019-12-27 DIAGNOSIS — A419 Sepsis, unspecified organism: Secondary | ICD-10-CM | POA: Diagnosis present

## 2019-12-27 DIAGNOSIS — B370 Candidal stomatitis: Secondary | ICD-10-CM | POA: Diagnosis present

## 2019-12-27 DIAGNOSIS — E1169 Type 2 diabetes mellitus with other specified complication: Secondary | ICD-10-CM | POA: Diagnosis present

## 2019-12-27 DIAGNOSIS — B20 Human immunodeficiency virus [HIV] disease: Secondary | ICD-10-CM | POA: Diagnosis not present

## 2019-12-27 DIAGNOSIS — J9601 Acute respiratory failure with hypoxia: Secondary | ICD-10-CM | POA: Diagnosis present

## 2019-12-27 DIAGNOSIS — F1910 Other psychoactive substance abuse, uncomplicated: Secondary | ICD-10-CM | POA: Diagnosis not present

## 2019-12-27 DIAGNOSIS — D6959 Other secondary thrombocytopenia: Secondary | ICD-10-CM | POA: Diagnosis present

## 2019-12-27 DIAGNOSIS — E118 Type 2 diabetes mellitus with unspecified complications: Secondary | ICD-10-CM | POA: Diagnosis not present

## 2019-12-27 DIAGNOSIS — Z590 Homelessness: Secondary | ICD-10-CM | POA: Diagnosis not present

## 2019-12-27 DIAGNOSIS — Y908 Blood alcohol level of 240 mg/100 ml or more: Secondary | ICD-10-CM | POA: Diagnosis present

## 2019-12-27 DIAGNOSIS — Z20822 Contact with and (suspected) exposure to covid-19: Secondary | ICD-10-CM | POA: Diagnosis present

## 2019-12-27 DIAGNOSIS — K029 Dental caries, unspecified: Secondary | ICD-10-CM | POA: Diagnosis present

## 2019-12-27 DIAGNOSIS — S02652A Fracture of angle of left mandible, initial encounter for closed fracture: Secondary | ICD-10-CM | POA: Diagnosis present

## 2019-12-27 DIAGNOSIS — Z794 Long term (current) use of insulin: Secondary | ICD-10-CM | POA: Diagnosis not present

## 2019-12-27 DIAGNOSIS — E119 Type 2 diabetes mellitus without complications: Secondary | ICD-10-CM | POA: Diagnosis not present

## 2019-12-27 DIAGNOSIS — Z79899 Other long term (current) drug therapy: Secondary | ICD-10-CM | POA: Diagnosis not present

## 2019-12-27 DIAGNOSIS — F17210 Nicotine dependence, cigarettes, uncomplicated: Secondary | ICD-10-CM | POA: Diagnosis present

## 2019-12-27 DIAGNOSIS — E785 Hyperlipidemia, unspecified: Secondary | ICD-10-CM | POA: Diagnosis present

## 2019-12-27 LAB — URINALYSIS, ROUTINE W REFLEX MICROSCOPIC
Bilirubin Urine: NEGATIVE
Glucose, UA: NEGATIVE mg/dL
Ketones, ur: 5 mg/dL — AB
Leukocytes,Ua: NEGATIVE
Nitrite: NEGATIVE
Protein, ur: 100 mg/dL — AB
Specific Gravity, Urine: 1.019 (ref 1.005–1.030)
pH: 5 (ref 5.0–8.0)

## 2019-12-27 LAB — TROPONIN I (HIGH SENSITIVITY)
Troponin I (High Sensitivity): 101 ng/L (ref <18)
Troponin I (High Sensitivity): 91 ng/L — ABNORMAL HIGH (ref <18)

## 2019-12-27 LAB — LACTIC ACID, PLASMA
Lactic Acid, Venous: 1.6 mmol/L (ref 0.5–1.9)
Lactic Acid, Venous: 1.2 mmol/L (ref 0.5–1.9)

## 2019-12-27 LAB — MAGNESIUM: Magnesium: 1.8 mg/dL (ref 1.7–2.4)

## 2019-12-27 LAB — PHOSPHORUS: Phosphorus: 2.5 mg/dL (ref 2.5–4.6)

## 2019-12-27 LAB — RAPID URINE DRUG SCREEN, HOSP PERFORMED
Amphetamines: NOT DETECTED
Barbiturates: NOT DETECTED
Benzodiazepines: NOT DETECTED
Cocaine: POSITIVE — AB
Opiates: POSITIVE — AB
Tetrahydrocannabinol: POSITIVE — AB

## 2019-12-27 LAB — CBG MONITORING, ED: Glucose-Capillary: 120 mg/dL — ABNORMAL HIGH (ref 70–99)

## 2019-12-27 LAB — GLUCOSE, CAPILLARY
Glucose-Capillary: 105 mg/dL — ABNORMAL HIGH (ref 70–99)
Glucose-Capillary: 125 mg/dL — ABNORMAL HIGH (ref 70–99)

## 2019-12-27 LAB — STREP PNEUMONIAE URINARY ANTIGEN: Strep Pneumo Urinary Antigen: NEGATIVE

## 2019-12-27 LAB — SARS CORONAVIRUS 2 BY RT PCR (HOSPITAL ORDER, PERFORMED IN ~~LOC~~ HOSPITAL LAB): SARS Coronavirus 2: NEGATIVE

## 2019-12-27 LAB — PROCALCITONIN: Procalcitonin: <0.1 ng/mL

## 2019-12-27 MED ORDER — ATORVASTATIN CALCIUM 40 MG PO TABS
40.0000 mg | ORAL_TABLET | Freq: Every day | ORAL | Status: DC
  Administered 2019-12-27 – 2020-01-06 (×11): 40 mg via ORAL
  Filled 2019-12-27 (×11): qty 1

## 2019-12-27 MED ORDER — LORAZEPAM 1 MG PO TABS: 1.0000 mg | ORAL_TABLET | ORAL | Status: DC | PRN

## 2019-12-27 MED ORDER — BISACODYL 5 MG PO TBEC
5.0000 mg | DELAYED_RELEASE_TABLET | Freq: Every day | ORAL | Status: DC | PRN
  Administered 2020-01-02: 5 mg via ORAL
  Filled 2019-12-27: qty 1

## 2019-12-27 MED ORDER — INSULIN ASPART 100 UNIT/ML ~~LOC~~ SOLN
0.0000 [IU] | Freq: Three times a day (TID) | SUBCUTANEOUS | Status: DC
  Administered 2019-12-29: 1 [IU] via SUBCUTANEOUS

## 2019-12-27 MED ORDER — SODIUM CHLORIDE 0.9 % IV SOLN
500.0000 mg | Freq: Once | INTRAVENOUS | Status: AC
  Administered 2019-12-27: 500 mg via INTRAVENOUS
  Filled 2019-12-27: qty 500

## 2019-12-27 MED ORDER — ONDANSETRON HCL 4 MG/2ML IJ SOLN: 4.0000 mg | Freq: Four times a day (QID) | INTRAMUSCULAR | Status: DC | PRN

## 2019-12-27 MED ORDER — HYDROCODONE-ACETAMINOPHEN 5-325 MG PO TABS
1.0000 | ORAL_TABLET | ORAL | Status: DC | PRN
  Administered 2019-12-29 – 2019-12-30 (×4): 1 via ORAL
  Administered 2019-12-30 – 2020-01-05 (×17): 2 via ORAL
  Filled 2019-12-27 (×9): qty 2
  Filled 2019-12-27: qty 1
  Filled 2019-12-27: qty 2
  Filled 2019-12-27 (×2): qty 1
  Filled 2019-12-27 (×3): qty 2
  Filled 2019-12-27: qty 1
  Filled 2019-12-27 (×5): qty 2

## 2019-12-27 MED ORDER — ACETAMINOPHEN 650 MG RE SUPP: 650.0000 mg | Freq: Four times a day (QID) | RECTAL | Status: DC | PRN

## 2019-12-27 MED ORDER — ONDANSETRON HCL 4 MG PO TABS: 4.0000 mg | ORAL_TABLET | Freq: Four times a day (QID) | ORAL | Status: DC | PRN

## 2019-12-27 MED ORDER — THIAMINE HCL 100 MG PO TABS
100.0000 mg | ORAL_TABLET | Freq: Every day | ORAL | Status: DC
  Administered 2019-12-27 – 2020-01-06 (×10): 100 mg via ORAL
  Filled 2019-12-27 (×11): qty 1

## 2019-12-27 MED ORDER — ENOXAPARIN SODIUM 40 MG/0.4ML ~~LOC~~ SOLN
40.0000 mg | SUBCUTANEOUS | Status: DC
  Administered 2019-12-27 – 2020-01-05 (×10): 40 mg via SUBCUTANEOUS
  Filled 2019-12-27 (×12): qty 0.4

## 2019-12-27 MED ORDER — LACTATED RINGERS IV BOLUS
600.0000 mL | Freq: Once | INTRAVENOUS | Status: AC
  Administered 2019-12-27: 600 mL via INTRAVENOUS

## 2019-12-27 MED ORDER — ACETAMINOPHEN 325 MG PO TABS
650.0000 mg | ORAL_TABLET | Freq: Four times a day (QID) | ORAL | Status: DC | PRN
  Administered 2019-12-28 – 2019-12-29 (×2): 650 mg via ORAL
  Filled 2019-12-27 (×2): qty 2

## 2019-12-27 MED ORDER — HYDRALAZINE HCL 20 MG/ML IJ SOLN: 5.0000 mg | INTRAMUSCULAR | Status: DC | PRN

## 2019-12-27 MED ORDER — NICOTINE 14 MG/24HR TD PT24
14.0000 mg | MEDICATED_PATCH | Freq: Every day | TRANSDERMAL | Status: DC
  Administered 2019-12-27 – 2020-01-05 (×10): 14 mg via TRANSDERMAL
  Filled 2019-12-27 (×11): qty 1

## 2019-12-27 MED ORDER — LACTATED RINGERS IV SOLN: INTRAVENOUS | Status: DC

## 2019-12-27 MED ORDER — SODIUM CHLORIDE 0.9 % IV SOLN
1.0000 g | INTRAVENOUS | Status: DC
  Administered 2019-12-28 – 2019-12-30 (×3): 1 g via INTRAVENOUS
  Filled 2019-12-27 (×3): qty 10

## 2019-12-27 MED ORDER — SODIUM CHLORIDE 0.9 % IV SOLN
500.0000 mg | INTRAVENOUS | Status: DC
  Administered 2019-12-28 – 2020-01-02 (×5): 500 mg via INTRAVENOUS
  Filled 2019-12-27 (×6): qty 500

## 2019-12-27 MED ORDER — THIAMINE HCL 100 MG/ML IJ SOLN
100.0000 mg | Freq: Every day | INTRAMUSCULAR | Status: DC
  Administered 2020-01-04: 100 mg via INTRAVENOUS
  Filled 2019-12-27 (×2): qty 2

## 2019-12-27 MED ORDER — ADULT MULTIVITAMIN W/MINERALS CH
1.0000 | ORAL_TABLET | Freq: Every day | ORAL | Status: DC
  Administered 2019-12-27 – 2020-01-05 (×10): 1 via ORAL
  Filled 2019-12-27 (×10): qty 1

## 2019-12-27 MED ORDER — BICTEGRAVIR-EMTRICITAB-TENOFOV 50-200-25 MG PO TABS
1.0000 | ORAL_TABLET | Freq: Every day | ORAL | Status: DC
  Administered 2019-12-27 – 2020-01-06 (×11): 1 via ORAL
  Filled 2019-12-27 (×11): qty 1

## 2019-12-27 MED ORDER — LORAZEPAM 2 MG/ML IJ SOLN
1.0000 mg | INTRAMUSCULAR | Status: DC | PRN
  Administered 2019-12-27 (×4): 1 mg via INTRAVENOUS
  Administered 2019-12-27: 2 mg via INTRAVENOUS
  Filled 2019-12-27: qty 2
  Filled 2019-12-27 (×4): qty 1

## 2019-12-27 MED ORDER — DOCUSATE SODIUM 100 MG PO CAPS
100.0000 mg | ORAL_CAPSULE | Freq: Two times a day (BID) | ORAL | Status: DC
  Administered 2019-12-27 – 2020-01-06 (×21): 100 mg via ORAL
  Filled 2019-12-27 (×21): qty 1

## 2019-12-27 MED ORDER — FOLIC ACID 1 MG PO TABS
1.0000 mg | ORAL_TABLET | Freq: Every day | ORAL | Status: DC
  Administered 2019-12-27 – 2020-01-06 (×11): 1 mg via ORAL
  Filled 2019-12-27 (×11): qty 1

## 2019-12-27 MED ORDER — SODIUM CHLORIDE 0.9% FLUSH
3.0000 mL | Freq: Two times a day (BID) | INTRAVENOUS | Status: DC
  Administered 2019-12-27 – 2020-01-05 (×13): 3 mL via INTRAVENOUS

## 2019-12-27 MED ORDER — FLUCONAZOLE 200 MG PO TABS
200.0000 mg | ORAL_TABLET | Freq: Every day | ORAL | Status: DC
  Administered 2019-12-27 – 2020-01-06 (×11): 200 mg via ORAL
  Filled 2019-12-27 (×11): qty 1

## 2019-12-27 MED ORDER — POLYETHYLENE GLYCOL 3350 17 G PO PACK: 17.0000 g | PACK | Freq: Every day | ORAL | Status: DC | PRN

## 2019-12-27 MED ORDER — SODIUM CHLORIDE 0.9 % IV SOLN
1.0000 g | Freq: Once | INTRAVENOUS | Status: AC
  Administered 2019-12-27: 1 g via INTRAVENOUS
  Filled 2019-12-27: qty 10

## 2019-12-27 MED ORDER — SODIUM CHLORIDE 0.9 % IV BOLUS
1000.0000 mL | Freq: Once | INTRAVENOUS | Status: AC
  Administered 2019-12-27: 1000 mL via INTRAVENOUS

## 2019-12-27 MED ORDER — QUETIAPINE FUMARATE 200 MG PO TABS
200.0000 mg | ORAL_TABLET | Freq: Every day | ORAL | Status: DC
  Administered 2019-12-27 – 2019-12-28 (×2): 200 mg via ORAL
  Filled 2019-12-27: qty 1
  Filled 2019-12-27 (×2): qty 2
  Filled 2019-12-27: qty 1
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
## 2019-12-27 NOTE — H&P (Addendum)
History and Physical    LOCHLAN Cooper GLO:[id number] DOB: 23-Mar-1961 DOA: 12/26/2019  PCP: Bryon Lions, PA-C Consultants:  Jeannine Kitten - psychiatry; Carver Fila - ID Patient coming from: Homeless; NOK: Derrick Cooper, [phone]  Chief Complaint: SOB  HPI: Derrick Cooper is a 59 y.o. male with medical history significant of schizophrenia; HIV; DM; and polysubstance abuse presenting with ETOH intoxication. He doesn't know what has been going on.  He doesn't know why he came to the hospital, doesn't remember anything about it.  He has been drinking and not taking care of himself.  He is shaky.  He does not feel anxious.  He has not bene taking his meds - insulin, HIV meds, schizophrenia meds - because he has been drinking every day.  He hasn't had meds in over a month.  +cough, SOB x 1 week.  Nonproductive cough.  No fevers.  Mild chest discomfort.     ED Course:  ETOH dependence, HIV - uncontrolled.  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  Presented with ETOH intoxication.  O2 sats dropped into the 80s and he became hypotensive.  Cultured, given Rocephin/Azithro.  ?COVID, not vaccinated - test is pending.  +cough - ?aspiration, COVID, TB.  Review of Systems: As per HPI; otherwise review of systems reviewed and negative.   Ambulatory Status:  Ambulates without assistance  COVID Vaccine Status:  None  Past Medical History:  Diagnosis Date  . Diabetes mellitus without complication (HCC)   . Hepatitis C   . HIV infection (HCC)   . Schizophrenia (HCC)   . Substance abuse (HCC)     History reviewed. No pertinent surgical history.  Social History   Socioeconomic History  . Marital status: Single    Spouse name: Not on file  . Number of children: 0  . Years of education: 9  . Highest education level: Not on file  Occupational History  . Occupation: unemployed  Tobacco Use  . Smoking status: Current Every Day Smoker    Packs/day: 0.50    Years: 42.00    Pack years: 21.00    Types:  Cigarettes  . Smokeless tobacco: Never Used  Substance and Sexual Activity  . Alcohol use: Yes    Comment: 8-9 42 ounce beers a day  . Drug use: Yes    Types: Marijuana, Methamphetamines, "Crack" cocaine    Comment: has not used meth since 03/2017, marijuana and crack a couple of times a week  . Sexual activity: Not Currently    Partners: Female    Comment: declined condoms 06/2019  Other Topics Concern  . Not on file  Social History Narrative  . Not on file   Social Determinants of Health   Financial Resource Strain:   . Difficulty of Paying Living Expenses:   Food Insecurity:   . Worried About Programme researcher, broadcasting/film/video in the Last Year:   . Barista in the Last Year:   Transportation Needs:   . Freight forwarder (Medical):   Marland Kitchen Lack of Transportation (Non-Medical):   Physical Activity:   . Days of Exercise per Week:   . Minutes of Exercise per Session:   Stress:   . Feeling of Stress :   Social Connections:   . Frequency of Communication with Friends and Family:   . Frequency of Social Gatherings with Friends and Family:   . Attends Religious Services:   . Active Member of Clubs or Organizations:   . Attends Club or  Organization Meetings:   Marland Kitchen Marital Status:   Intimate Partner Violence:   . Fear of Current or Ex-Partner:   . Emotionally Abused:   Marland Kitchen Physically Abused:   . Sexually Abused:     No Known Allergies  Family History  Problem Relation Age of Onset  . Breast cancer Mother   . Lung cancer Father   . Heart attack Father     Prior to Admission medications   Medication Sig Start Date End Date Taking? Authorizing Provider  atorvastatin (LIPITOR) 40 MG tablet Take 40 mg by mouth daily.  06/04/19   [provider]  BIKTARVY 50-200-25 MG TABS tablet TAKE 1 TABLET BY MOUTH DAILY. 07/11/19   Veryl Speak, FNP  cephALEXin (KEFLEX) 500 MG capsule Take 2 capsules (1,000 mg total) by mouth 2 (two) times daily. 12/10/19   Cathren Laine, MD    metFORMIN (GLUCOPHAGE) 500 MG tablet Take 1 tablet (500 mg total) by mouth 2 (two) times daily with a meal. 10/28/19   Malvin Johns, MD  QUEtiapine (SEROQUEL) 200 MG tablet Take 1 tablet (200 mg total) by mouth at bedtime. 10/28/19   Malvin Johns, MD    Physical Exam: Vitals:   12/27/19 0800 12/27/19 0815 12/27/19 0830 12/27/19 0845  BP: (!) 138/92 (!) 133/91 127/84 126/81  Pulse: 87 100 (!) 106 96  Resp: 17 (!) 21 16 15   Temp:      TempSrc:      SpO2: 94% 95% 100% 97%  Weight:      Height:         . General:  Appears calm and comfortable and is NAD; he is frail and cachectic . Eyes:  L eye with remote loss, wearing eye patch; R eye is appropriate . ENT:  grossly normal hearing, lips & tongue, mmm; poor dentition . Neck:  no LAD, masses or thyromegaly . Cardiovascular:  RR with mild tachycardia, no m/r/g. No LE edema.  Respiratory:   RLL rhonchi with poor air movement.  Normal respiratory effort. . Abdomen:  soft, NT, ND, NABS . Back:   normal alignment, no CVAT . Skin:  no rash or induration seen on limited exam . Musculoskeletal:  grossly normal tone BUE/BLE, good ROM, no bony abnormality . Psychiatric:  blunted mood and affect, speech fluent and appropriate, AOx3 . Neurologic:  CN 2-12 grossly intact, moves all extremities in coordinated fashion    Radiological Exams on Admission: DG Chest 2 View  Result Date: 12/27/2019 CLINICAL DATA:  Cough and congestion for 2 days EXAM: CHEST - 2 VIEW COMPARISON:  Radiograph 12/10/2019 FINDINGS: There is some asymmetric volume loss in the right hemithorax with more bandlike opacities in the right lung base and upper lung favoring subsegmental atelectatic change. More patchy opacity with airways thickening in the right infrahilar lung is more pronounced than on comparison study in could reflect developing consolidation. Left lung is clear. No pneumothorax or visible effusion. Cardiomediastinal contours are stable from prior. No acute  osseous or soft tissue abnormality. IMPRESSION: 1. Increasing patchy right infrahilar opacity with airways thickening, suspicious for developing pneumonia. 2. Right basilar and upper lung subsegmental atelectatic change, with additional volume loss in the right hemithorax. Electronically Signed   By: 02/09/2020 M.D.   On: 12/27/2019 06:40    EKG: Independently reviewed.  Sinus tachycardia with rate 110; nonspecific ST changes with no evidence of acute ischemia   Labs on Admission: I have personally reviewed the available labs and imaging studies at  the time of the admission.  Pertinent labs:   Glucose 146 Albumin 3.2 AST 98/ALT 81 WBC 6.6 Hgb 12.7 Platelets 125 ETOH 404 COVID pending 5/10 Urine culture with >100k colonies Klebsiella pneumonia, resistant to Ampicillin, Cipro, Nitrofurantoin   Assessment/Plan Principal Problem:   Acute respiratory failure with hypoxia (HCC) Active Problems:   HIV disease (HCC)   Hyperlipidemia associated with type 2 diabetes mellitus (HCC)   Schizophrenia (HCC)   Controlled diabetes mellitus with complication, without long-term current use of insulin (HCC)   Tobacco use   Polysubstance abuse (HCC)   Homelessness   Lobar pneumonia (HCC)   Sepsis due to pneumonia (HCC)    Acute respiratory failure with hypoxia, possible sepsis, probably due to PNA -Patient was found intoxicated on the side of the road and initial plan was to simply monitor while he recovered from intoxication -However, while in the ER his O2 sats dropped to the 80s persistently and he developed hypotension -SIRS criteria in this patient includes: Tachycardia, tachypnea, hypoxia  -Patient has evidence of acute organ failure with recurrent hypotension (SBP < 90 or MAP < 65 x 2 readings) that is not easily explained by another condition. -While awaiting blood cultures, this appears to be a preseptic condition. -Sepsis protocol initiated -Patient had SBP <90/MAP <65 and so has  received the 30 cc/kg IVF bolus. -Suspected source is PNA given cough, decreased oxygen saturation, and infiltrate in right lower lobe on chest x-ray -Aspiration is a consideration given his marked intoxication on admission -COVID-19 negative. -He does not have severe CAP by criteria but has immunocompromise and so is at risk for severe disease and atypical infections including TB -Gram stain and culture ordered as well as acid fast sputum/culture and quantiferon gold -Legionella testing ordered -Will order lower respiratory tract procalcitonin level.  Antibiotics may not be indicated for PCT <0.1 and probably should not be used for < 0.25.  >0.5 indicates infection and >>0.5 indicates more serious disease.  As the procalcitonin level normalizes, it will be reasonable to consider de-escalation of antibiotic coverage.  The sensitivity of procalcitonin is variable and should not be used alone to guide treatment. -He did have a recent Klebsiella UTI which was resistant to Rocephin -Will start Azithromycin 500 mg daily AND Rocephin due to no risk factors for MDR cause. -Additional complicating factors include: hypoxia/hypoxemia; and immunocompromise. -LR @ 100 cc/hr -Fever control -Repeat CBC in am -Will admit for now to progressive care due to hemodynamic instability while in the ER (as well as the potential for more severe ETOH withdrawal) -Blood and urine cultures pending -Will trend lactate  HIV -Resume Biktarvy  Schizophrenia -resume Seroquel -ID consulted  DM -Recent A1c was 7.1 -hold Glucophage -Hold Lantus for now - he has not been taking this and his glucose is only 146 so he appears to be at risk of severe hypoglycemia if given Lantus 50 units -Cover with sensitive-scale SSI  Polysubstance abuse -Patient with initial ETOH level of 404 -Currently awake and able to converse without difficulty -He is feeling somewhat tremulous -He is at high risk for complications of withdrawal  including seizures, DTs -CIWA protocol -TOC team consult for possible inpatient treatment -Elevated LFTs are likely related to alcoholism  Thrombocytopenia - Due to bone marrow suppression from alcohol abuse - Monitor daily platelet count, currently mild   Homelessness -TOC team consult  HLD -He does not appear to be taking medications for this issue at this time  Tobacco dependence -Encourage cessation.   -  This was discussed with the patient and should be reviewed on an ongoing basis.   -Patch ordered at patient request.    Note: This patient has been tested and is negative for the novel coronavirus COVID-19.   DVT prophylaxis:  Lovenox Code Status:  Full - confirmed with patient Family Communication: None present; he did not request that I contact his sister at the time of admission Disposition Plan:  The patient is from: home  Anticipated d/c is to: homelessness  Anticipated d/c date will depend on clinical response to treatment, likely several days depending on clinical course  Patient is currently: acutely ill Consults called: ID; ST/RT; TOC team  Admission status: Admit - It is my clinical opinion that admission to INPATIENT is reasonable and necessary because of the expectation that this patient will require hospital care that crosses at least 2 midnights to treat this condition based on the medical complexity of the problems presented.  Given the aforementioned information, the predictability of an adverse outcome is felt to be significant.      Karmen Bongo MD [facility]   How to contact the [facility] Attending or Consulting provider [place] or covering provider during after hours [place], for this patient?  1. Check the care team in [facility] and look for a) attending/consulting TRH provider listed and b) the [facility] team listed 2. Log into [email or web address] and use [place]'s universal password to access. If you do not have the password, please contact the hospital  operator. 3. Locate the [facility] provider you are looking for under [facility] and page to a number that you can be directly reached. 4. If you still have difficulty reaching the provider, please page the [facility] (Director on Call) for the Hospitalists listed on amion for assistance.   12/27/2019, 9:37 AM

## 2019-12-27 NOTE — Progress Notes (Signed)
   12/27/19 1934  Assess: MEWS Score  Temp 99.6 F (37.6 C)  BP (!) 163/106  Pulse Rate (!) 124  Resp 20  SpO2 93 %  O2 Device Nasal Cannula  Assess: MEWS Score  MEWS Temp 0  MEWS Systolic 0  MEWS Pulse 2  MEWS RR 0  MEWS LOC 0  MEWS Score 2  MEWS Score Color Yellow  Assess: if the MEWS score is Yellow or Red  Were vital signs taken at a resting state? Yes  Focused Assessment Documented focused assessment  Early Detection of Sepsis Score *See Row Information* Low  MEWS guidelines implemented *See Row Information* No, previously red, continue vital signs every 4 hours  Treat  MEWS Interventions Administered prn meds/treatments;Escalated (See documentation below)  Take Vital Signs  Increase Vital Sign Frequency  Yellow: Q 2hr X 2 then Q 4hr X 2, if remains yellow, continue Q 4hrs  Escalate  MEWS: Escalate Yellow: discuss with charge nurse/RN and consider discussing with provider and RRT  Notify: Charge Nurse/RN  Name of Charge Nurse/RN Notified Alcario Drought RN  Date Charge Nurse/RN Notified 12/27/19  Time Charge Nurse/RN Notified 1940  Document  Patient Outcome Stabilized after interventions  CIWA patient - prn ativan given. Will continue to monitor.

## 2019-12-27 NOTE — ED Notes (Signed)
Pt sleeping at this time.

## 2019-12-27 NOTE — Progress Notes (Signed)
   12/27/19 2200  Assess: MEWS Score  Temp 99.1 F (37.3 C)  BP (!) 175/113  Pulse Rate (!) 120  ECG Heart Rate (!) 120  Resp (!) 30  SpO2 93 %  O2 Device Nasal Cannula  Assess: MEWS Score  MEWS Temp 0  MEWS Systolic 0  MEWS Pulse 2  MEWS RR 2  MEWS LOC 0  MEWS Score 4  MEWS Score Color Red  Assess: if the MEWS score is Yellow or Red  Were vital signs taken at a resting state? Yes  Focused Assessment Documented focused assessment  Early Detection of Sepsis Score *See Row Information* Medium  MEWS guidelines implemented *See Row Information* Yes  Treat  MEWS Interventions Administered prn meds/treatments;Escalated (See documentation below)  Take Vital Signs  Increase Vital Sign Frequency  Red: Q 1hr X 4 then Q 4hr X 4, if remains red, continue Q 4hrs  Escalate  MEWS: Escalate Red: discuss with charge nurse/RN and provider, consider discussing with RRT  Notify: Charge Nurse/RN  Name of Charge Nurse/RN Notified Alcario Drought RN  Date Charge Nurse/RN Notified 12/27/19  Time Charge Nurse/RN Notified 2243  Notify: Provider  Provider Name/Title [facility] APP  Date Provider Notified 12/27/19  Time Provider Notified 2243  Notification Type Page  Notification Reason Change in status  Response Other (Comment)  Date of Provider Response 12/27/19  Time of Provider Response 2050  Document  Patient Outcome Other (Comment)  Verbal order received: give no ativan or hydrocodone-acetaminophen for at least an hour from 2250.

## 2019-12-27 NOTE — ED Notes (Signed)
EDP aware of current patient status , po fluids given,

## 2019-12-27 NOTE — ED Notes (Signed)
Pt resting with eyes closed at this time.

## 2019-12-27 NOTE — ED Notes (Signed)
Report given to [facility] on [facility]

## 2019-12-28 ENCOUNTER — Inpatient Hospital Stay (HOSPITAL_COMMUNITY): Payer: Medicaid Other

## 2019-12-28 LAB — COMPREHENSIVE METABOLIC PANEL
ALT: 54 U/L — ABNORMAL HIGH (ref 0–44)
AST: 57 U/L — ABNORMAL HIGH (ref 15–41)
Albumin: 2.6 g/dL — ABNORMAL LOW (ref 3.5–5.0)
Alkaline Phosphatase: 57 U/L (ref 38–126)
Anion gap: 9 (ref 5–15)
BUN: 8 mg/dL (ref 6–20)
CO2: 28 mmol/L (ref 22–32)
Calcium: 8.5 mg/dL — ABNORMAL LOW (ref 8.9–10.3)
Chloride: 96 mmol/L — ABNORMAL LOW (ref 98–111)
Creatinine, Ser: 0.6 mg/dL — ABNORMAL LOW (ref 0.61–1.24)
GFR calc Af Amer: >60 mL/min (ref >60)
GFR calc non Af Amer: >60 mL/min (ref >60)
Glucose, Bld: 110 mg/dL — ABNORMAL HIGH (ref 70–99)
Potassium: 3.1 mmol/L — ABNORMAL LOW (ref 3.5–5.1)
Sodium: 133 mmol/L — ABNORMAL LOW (ref 135–145)
Total Bilirubin: 1.2 mg/dL (ref 0.3–1.2)
Total Protein: 8 g/dL (ref 6.5–8.1)

## 2019-12-28 LAB — CBC
HCT: 33 % — ABNORMAL LOW (ref 39.0–52.0)
Hemoglobin: 11.6 g/dL — ABNORMAL LOW (ref 13.0–17.0)
MCH: 31.9 pg (ref 26.0–34.0)
MCHC: 35.2 g/dL (ref 30.0–36.0)
MCV: 90.7 fL (ref 80.0–100.0)
Platelets: 108 10*3/uL — ABNORMAL LOW (ref 150–400)
RBC: 3.64 MIL/uL — ABNORMAL LOW (ref 4.22–5.81)
RDW: 12.8 % (ref 11.5–15.5)
WBC: 7 10*3/uL (ref 4.0–10.5)
nRBC: 0 % (ref 0.0–0.2)

## 2019-12-28 LAB — HIV-1 RNA QUANT-NO REFLEX-BLD
HIV 1 RNA Quant: 1650 copies/mL
LOG10 HIV-1 RNA: 3.217 log10copy/mL

## 2019-12-28 LAB — GLUCOSE, CAPILLARY
Glucose-Capillary: 90 mg/dL (ref 70–99)
Glucose-Capillary: 100 mg/dL — ABNORMAL HIGH (ref 70–99)
Glucose-Capillary: 113 mg/dL — ABNORMAL HIGH (ref 70–99)
Glucose-Capillary: 102 mg/dL — ABNORMAL HIGH (ref 70–99)

## 2019-12-28 MED ORDER — METOPROLOL TARTRATE 5 MG/5ML IV SOLN
5.0000 mg | INTRAVENOUS | Status: DC | PRN
  Administered 2019-12-28: 5 mg via INTRAVENOUS
  Filled 2019-12-28: qty 5

## 2019-12-28 MED ORDER — POTASSIUM CHLORIDE CRYS ER 20 MEQ PO TBCR
40.0000 meq | EXTENDED_RELEASE_TABLET | Freq: Two times a day (BID) | ORAL | Status: AC
  Administered 2019-12-28 (×2): 40 meq via ORAL
  Filled 2019-12-28 (×2): qty 2
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
## 2019-12-28 NOTE — Progress Notes (Addendum)
Provider notified for heart rate sustaining at 145 while sleeping.  New orders received. Pushed 5mg  metoprolol IV, obtained 12 lead EKG.

## 2019-12-28 NOTE — Progress Notes (Signed)
   12/28/19 0000  Assess: MEWS Score  Temp 98.5 F (36.9 C)  BP 125/84  Pulse Rate (!) 147  ECG Heart Rate (!) 146  Resp (!) 25  SpO2 90 %  Assess: MEWS Score  MEWS Temp 0  MEWS Systolic 0  MEWS Pulse 3  MEWS RR 1  MEWS LOC 0  MEWS Score 4  MEWS Score Color Red  Assess: if the MEWS score is Yellow or Red  Were vital signs taken at a resting state? Yes  Focused Assessment Documented focused assessment  Early Detection of Sepsis Score *See Row Information* Medium  MEWS guidelines implemented *See Row Information* Yes  Treat  MEWS Interventions Administered scheduled meds/treatments;Escalated (See documentation below)  Take Vital Signs  Increase Vital Sign Frequency  Red: Q 1hr X 4 then Q 4hr X 4, if remains red, continue Q 4hrs (continue red)  Escalate  MEWS: Escalate Red: discuss with charge nurse/RN and provider, consider discussing with RRT  Notify: Charge Nurse/RN  Name of Charge Nurse/RN Notified Alcario Drought RN  Date Charge Nurse/RN Notified 12/28/19  Time Charge Nurse/RN Notified 0001  Notify: Provider  Provider Name/Title [facility]  Date Provider Notified 12/28/19  Time Provider Notified 0003  Notification Type Page  Notification Reason Change in status  Response See new orders  Date of Provider Response 12/28/19  Time of Provider Response 0010  Document  Patient Outcome Stabilized after interventions  Progress note created (see row info) Yes  Pushed 5mg  IV metoprolol and obtained 12 lead EKG per provider order

## 2019-12-28 NOTE — Evaluation (Signed)
Clinical/Bedside Swallow Evaluation Patient Details  Name: Derrick Cooper MRN: [id number] Date of Birth: [birth year]  Today's Date: 12/28/2019 Time: SLP Start Time (ACUTE ONLY): 1000 SLP Stop Time (ACUTE ONLY): 1019 SLP Time Calculation (min) (ACUTE ONLY): 19 min  Past Medical History:  Past Medical History:  Diagnosis Date  . Diabetes mellitus without complication (HCC)   . Hepatitis C   . HIV infection (HCC)   . Schizophrenia (HCC)   . Substance abuse [facility])    Past Surgical History: History reviewed. No pertinent surgical history. HPI:  59 y.o.malewith medical history significant ofschizophrenia; HIV; DM; and polysubstance abuse, ETOH abuse presened to ED with ETOH intoxication. CXR 12/28/19 indicated Unchanged appearance of the chest since yesterday. Patchy and streaky right lung base opacity with volume loss; associated with potential pneumonia and/or atelectasis. BSE generated to r/o dysphagia; Nursing also stated medication administration was adequate without dysphagia symptoms noted.   Assessment / Plan / Recommendation Clinical Impression  Pt exhibited oropharyngeal dysphagia characterized by oral holding and decreased bolus propulsion with  multiple swallows noted with puree consistency and delayed cough with solids (pt required mod-max cues during mastication d/t waxing/waning alertness); recommend Dysphagia 3 (mechanical soft)/thin liquid diet at this time primarily d/t mentation; pt also c/o difficulty with solids transitioning pharyngeally, so Dysphagia 3 diet may be beneficial for this as well.  ST will f/u for diet tolerance and potential progression of diet; SLE unable to be completed this visit d/t impaired mentation and pt was given sedative this a.m; will complete SLE as pt mentation improves. SLP Visit Diagnosis: Dysphagia, oropharyngeal phase (R13.12)    Aspiration Risk  Mild aspiration risk;Moderate aspiration risk    Diet Recommendation   Dysphagia 3/thin  liquids  Medication Administration: Whole meds with liquid    Other  Recommendations Oral Care Recommendations: Oral care BID   Follow up Recommendations Other (comment)(TBD)      Frequency and Duration min 2x/week  1 week       Prognosis Prognosis for Safe Diet Advancement: Good Barriers to Reach Goals: Cognitive deficits      Swallow Study   General Date of Onset: 12/26/19 HPI: 59 y.o.malewith medical history significant ofschizophrenia; HIV; DM; and polysubstance abuse, ETOH abuse presened to ED with ETOH intoxication. Type of Study: Bedside Swallow Evaluation Diet Prior to this Study: Thin liquids;Regular Temperature Spikes Noted: No Respiratory Status: Nasal cannula History of Recent Intubation: No Behavior/Cognition: Requires cueing;Lethargic/Drowsy Oral Cavity Assessment: Excessive secretions Oral Care Completed by SLP: Other (Comment)(limited d/t lethargy/cooperativeness) Oral Cavity - Dentition: Missing dentition;Poor condition Self-Feeding Abilities: Needs assist Patient Positioning: Upright in bed Baseline Vocal Quality: Low vocal intensity;Other (comment)(DTA d/t dysarthric speech noted/level of alertness) Volitional Cough: Cognitively unable to elicit Volitional Swallow: Unable to elicit    Oral/Motor/Sensory Function Overall Oral Motor/Sensory Function: Generalized oral weakness   Ice Chips Ice chips: Not tested   Thin Liquid Thin Liquid: Impaired Presentation: Straw Oral Phase Functional Implications: Oral holding    Nectar Thick Nectar Thick Liquid: Not tested   Honey Thick Honey Thick Liquid: Not tested   Puree Puree: Impaired Presentation: Spoon Oral Phase Impairments: Reduced lingual movement/coordination Pharyngeal Phase Impairments: Multiple swallows   Solid     Solid: Impaired Presentation: Spoon Oral Phase Impairments: Reduced lingual movement/coordination Oral Phase Functional Implications: Impaired mastication;Prolonged oral  transit;Oral holding;Other (comment)(required cueing) Pharyngeal Phase Impairments: Cough - Delayed      Tressie Stalker, M.S., CCC-SLP 12/28/2019,1:22 PM

## 2019-12-29 ENCOUNTER — Inpatient Hospital Stay (HOSPITAL_COMMUNITY): Payer: Medicaid Other

## 2019-12-29 LAB — CBC
HCT: 36.5 % — ABNORMAL LOW (ref 39.0–52.0)
Hemoglobin: 12.4 g/dL — ABNORMAL LOW (ref 13.0–17.0)
MCH: 31.2 pg (ref 26.0–34.0)
MCHC: 34 g/dL (ref 30.0–36.0)
MCV: 91.9 fL (ref 80.0–100.0)
Platelets: 114 10*3/uL — ABNORMAL LOW (ref 150–400)
RBC: 3.97 MIL/uL — ABNORMAL LOW (ref 4.22–5.81)
RDW: 12.5 % (ref 11.5–15.5)
WBC: 5.5 10*3/uL (ref 4.0–10.5)
nRBC: 0 % (ref 0.0–0.2)

## 2019-12-29 LAB — BASIC METABOLIC PANEL
Anion gap: 8 (ref 5–15)
BUN: 9 mg/dL (ref 6–20)
CO2: 27 mmol/L (ref 22–32)
Calcium: 8.9 mg/dL (ref 8.9–10.3)
Chloride: 99 mmol/L (ref 98–111)
Creatinine, Ser: 0.65 mg/dL (ref 0.61–1.24)
GFR calc Af Amer: >60 mL/min (ref >60)
GFR calc non Af Amer: >60 mL/min (ref >60)
Glucose, Bld: 84 mg/dL (ref 70–99)
Potassium: 3.3 mmol/L — ABNORMAL LOW (ref 3.5–5.1)
Sodium: 134 mmol/L — ABNORMAL LOW (ref 135–145)

## 2019-12-29 LAB — GLUCOSE, CAPILLARY
Glucose-Capillary: 78 mg/dL (ref 70–99)
Glucose-Capillary: 86 mg/dL (ref 70–99)
Glucose-Capillary: 137 mg/dL — ABNORMAL HIGH (ref 70–99)
Glucose-Capillary: 90 mg/dL (ref 70–99)

## 2019-12-29 LAB — QUANTIFERON-TB GOLD PLUS (RQFGPL)
QuantiFERON Mitogen Value: >10 IU/mL
QuantiFERON Nil Value: 0.05 IU/mL
QuantiFERON TB1 Ag Value: 0.1 IU/mL
QuantiFERON TB2 Ag Value: 0.06 IU/mL

## 2019-12-29 LAB — URINE CULTURE: Culture: >=100000 — AB

## 2019-12-29 LAB — QUANTIFERON-TB GOLD PLUS: QuantiFERON-TB Gold Plus: NEGATIVE

## 2019-12-29 MED ORDER — QUETIAPINE FUMARATE 100 MG PO TABS
100.0000 mg | ORAL_TABLET | Freq: Every day | ORAL | Status: DC
  Administered 2019-12-29 – 2020-01-05 (×8): 100 mg via ORAL
  Filled 2019-12-29 (×8): qty 1

## 2019-12-29 MED ORDER — POTASSIUM CHLORIDE 20 MEQ PO PACK
40.0000 meq | PACK | Freq: Two times a day (BID) | ORAL | Status: AC
  Administered 2019-12-29 – 2019-12-30 (×2): 40 meq via ORAL
  Filled 2019-12-29 (×2): qty 2

## 2019-12-29 MED ORDER — POTASSIUM CHLORIDE 20 MEQ PO PACK
40.0000 meq | PACK | Freq: Two times a day (BID) | ORAL | Status: DC
  Administered 2019-12-29: 40 meq via ORAL
  Filled 2019-12-29: qty 2

## 2019-12-29 NOTE — Plan of Care (Signed)
  Problem: Pain Managment: Goal: General experience of comfort will improve Outcome: Progressing   Problem: Safety: Goal: Ability to remain free from injury will improve Outcome: Progressing   
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
## 2019-12-29 NOTE — Evaluation (Signed)
Speech Language Pathology Evaluation Patient Details Name: Derrick Cooper MRN: [id number] DOB: [birth year] Today's Date: 12/29/2019 Time: 0951-1005 SLP Time Calculation (min) (ACUTE ONLY): 14 min  Problem List:  Patient Active Problem List   Diagnosis Date Noted  . Acute respiratory failure with hypoxia ([place]) 12/27/2019  . Lobar pneumonia ([place]) 12/27/2019  . Sepsis due to pneumonia ([place]) 12/27/2019  . Polysubstance abuse ([place]) 12/10/2019  . Homelessness 12/10/2019  . Substance induced mood disorder ([place]) 12/10/2019  . MDD (major depressive disorder), recurrent episode, severe ([place]) 08/09/2018  . Schizoaffective disorder, bipolar type ([place])   . Health care maintenance 06/26/2018  . Tobacco use 06/26/2018  . Furuncle of right axilla 11/13/2017  . Hyperlipidemia associated with type 2 diabetes mellitus ([place]) 10/25/2017  . 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 HIV disease ([place]) 09/27/2017  . Chronic hepatitis C without hepatic coma ([place]) 09/27/2017  . Acute non-recurrent maxillary sinusitis 09/27/2017  . Schizophrenia ([place]) 09/07/2017  . Controlled diabetes mellitus with complication, without long-term current use of insulin ([place]) 09/07/2017  . Urinary incontinence 09/07/2017   Past Medical History:  Past Medical History:  Diagnosis Date  . Diabetes mellitus without complication ([place])   . Hepatitis C   . HIV infection ([place])   . Schizophrenia ([place])   . Substance abuse [facility])    Past Surgical History: History reviewed. No pertinent surgical history. HPI:  59 y.o.malewith medical history significant ofschizophrenia; HIV; DM; and polysubstance abuse, ETOH abuse presened to ED with ETOH intoxication. CXR: Unchanged appearance of the chest since yesterday. Patchy and streaky right lung base opacity with volume loss dishes for pneumonia and atelectasis.   Assessment / Plan /  Recommendation Clinical Impression  Pt participated in speech/language evaluation. 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 He stated that he has baseline difficulty with memory and being able to "focus". He also described impairments in reasoning and problem solving when is not on his "psych meds". He demonstrated mild dysarthria which negatively impacted speech intelligibility at the conversational level when the context was unknown. His language skills were [facility] but he required additional processing time and repetition to complete 3-step commands secondary to impairments in attention and memory. He exhibited cognitive-linguistic difficulty in the areas of memory, attention, and executive function, but the pt indicated that he believes he would have performed similarly prior to admission. Pt has independently been compensating for articulatory imprecision with use of overarticulation, and he indicated that he is not concerned about his speech at this time since individuals are understanding him. Further skilled SLP services are not clinically indicated at this time for speech, language or cognition.    SLP Assessment  SLP Recommendation/Assessment: Patient does not need any further Speech Lanaguage Pathology Services SLP Visit Diagnosis: Dysarthria and anarthria (R47.1)    Follow Up Recommendations  None    Frequency and Duration           SLP Evaluation Cognition  Overall Cognitive Status: History of cognitive impairments - at baseline Arousal/Alertness: Awake/alert Orientation Level: Oriented X4 Attention: Focused;Sustained Focused Attention: Impaired Focused Attention Impairment: Verbal complex Sustained Attention: Impaired Sustained Attention Impairment: Verbal complex Memory: Impaired Memory Impairment: Storage deficit;Retrieval deficit;Decreased recall of new information(Immediate: 2/3; Delayed: 0/3; with cues: 0/3) Awareness: Appears  intact       Comprehension  Auditory Comprehension Overall Auditory Comprehension: Appears within functional limits for tasks assessed Yes/No  Questions: Within Functional Limits Basic Biographical Questions: (5/5) Complex Questions: (4/5) Paragraph Comprehension (via yes/no questions): (1/4) Commands: Impaired Two Step Basic Commands: (4/4) Multistep Basic Commands: (4/5) Conversation: Complex Interfering Components: Attention;Processing speed;Working Radio broadcast assistant: Music therapist Reading Comprehension Reading Status: Not tested    Expression Expression Primary Mode of Expression: Verbal Verbal Expression Overall Verbal Expression: Appears within functional limits for tasks assessed Initiation: No impairment Automatic Speech: Counting;Day of week;Month of year(WNL) Level of Generative/Spontaneous Verbalization: Conversation Repetition: No impairment Naming: No impairment(10/10) Pragmatics: No impairment   Oral / Motor  Oral Motor/Sensory Function Overall Oral Motor/Sensory Function: Generalized oral weakness Motor Speech Overall Motor Speech: Impaired Respiration: Within functional limits Phonation: Normal Resonance: Within functional limits Articulation: Impaired Level of Impairment: Conversation Intelligibility: Intelligibility reduced Word: 75-100% accurate Phrase: 75-100% accurate Sentence: 75-100% accurate Conversation: 75-100% accurate Motor Planning: Witnin functional limits Motor Speech Errors: Aware;Consistent   Kahil Agner I. Vear Clock, MS, CCC-SLP Acute Rehabilitation Services Office number [phone] Pager [phone]                    Scheryl Marten 12/29/2019, 2:35 PM

## 2019-12-29 NOTE — Progress Notes (Signed)
PROGRESS NOTE    Derrick Cooper  ZOX:[id number] DOB: Sep 13, 1960 DOA: 12/26/2019 PCP: Bryon Lions, PA-C  Brief Narrative:  HPI: Derrick Cooper  59 year old homeless male who sleeps outside [facility] with medical history significant of schizophrenia; HIV; DM; and polysubstance abuse, ETOH abuse presened to ED with ETOH intoxication. -Patient was [place] in [place] by the side of the road, intoxicated with visual and auditory hallucinations. -He doesn't know why he came to the hospital, doesn't remember anything about it. He has been drinking, using cocaine, not taking his meds including psychotropic meds and HIV meds  ED Course:  ETOH dependence, HIV - uncontrolled. Presented with ETOH intoxication.  O2 sats dropped into the 80s and he became hypotensive.    Chest x-ray with possible right lower lobe infiltration cultured, given Rocephin/Azithro.    Assessment & Plan:   Acute respiratory failure with hypoxia -Sepsis, right lower lobe pneumonia present on admission -Possible aspiration, found intoxicated -Stabilized with fluids, started on IV ceftriaxone and azithromycin -Clinically improving, wean off O2, SLP evaluation noted mild dysphagia -He is also at risk of opportunistic infections, monitor clinical response -Ambulate, PT OT  HIV -Last CD4 count in November was greater than 200, restarted on Biktarvy -Also with oral thrush, started on fluconazole   Trauma -Follow-up mandible x-ray/orthopantogram, radiology was unable to do this unless patient was able to stand up independently  Schizophrenia -resume Seroquel  DM -Recent A1c was 7.1 -Glucophage on hold, CBGs are less than 120, sliding scale insulin for now -Stable  Polysubstance abuse -Including alcohol, cocaine, cannabis - initial ETOH level of 404 -At risk of withdrawal, continue thiamine, monitor on CIWA protocol  Thrombocytopenia - Due to bone marrow suppression from alcohol  abuse -Monitor  Homelessness numerous social issues including alcoholism and polysubstance abuse -TOC team consult -Counseled  Tobacco dependence -Counseled, continue nicotine patch  DVT prophylaxis:Lovenox Code Status:Full code Family Communication:Discussed with patient, no family at bedside Disposition Plan:             Remains inpatient appropriate because:Hemodynamically unstable   Dispo: The patient is from: Homeless              Anticipated d/c is to: To be determined              Anticipated d/c date is: 2-3 days              Patient currently is not medically stable to d/c.  Consultants:   Infectious disease   Procedures:   Antimicrobials:    Subjective: -Still tachycardic, heart rate overall improving, more alert and interactive, complains of cough and congestion  Objective: Vitals:   12/29/19 0220 12/29/19 0600 12/29/19 0700 12/29/19 1100  BP:  116/83    Pulse:  (!) 101    Resp:  20    Temp:  97.8 F (36.6 C) 97.8 F (36.6 C) 98.5 F (36.9 C)  TempSrc:   Oral Oral  SpO2:  100%    Weight: 60.2 kg     Height:        Intake/Output Summary (Last 24 hours) at 12/29/2019 1226 Last data filed at 12/29/2019 8119 Gross per 24 hour  Intake 840 ml  Output 3725 ml  Net -2885 ml   Filed Weights   12/27/19 0253 12/27/19 1513 12/29/19 0220  Weight: 68 kg 64.6 kg 60.2 kg    Examination:  General exam: Chronically ill debilitated male appears much older than stated age, awake alert oriented to self  and partly to place only HEENT: 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, prior enucleation CVS S1-S2 regular rhythm, tachycardic Lungs: Bilateral lower lobe rhonchi Abdomen is soft, nontender, bowel sounds present Extremities: No edema Skin: No rashes on exposed skin Psychiatry: Very poor insight and judgment    Data Reviewed:   CBC: Recent Labs  Lab 12/26/19 1915 12/28/19 0043 12/29/19 0629  WBC 6.6 7.0 5.5  HGB 12.7* 11.6* 12.4*  HCT 37.8* 33.0*  36.5*  MCV 93.8 90.7 91.9  PLT 125* 108* 114*   Basic Metabolic Panel: Recent Labs  Lab 12/26/19 1915 12/27/19 1015 12/28/19 0043 12/29/19 0629  NA 136  --  133* 134*  K 3.8  --  3.1* 3.3*  CL 97*  --  96* 99  CO2 28  --  28 27  GLUCOSE 146*  --  110* 84  BUN 13  --  8 9  CREATININE 0.90  --  0.60* 0.65  CALCIUM 8.7*  --  8.5* 8.9  MG  --  1.8  --   --   PHOS  --  2.5  --   --    GFR: Estimated Creatinine Clearance: 86.7 mL/min (by C-G formula based on SCr of 0.65 mg/dL). Liver Function Tests: Recent Labs  Lab 12/26/19 1915 12/28/19 0043  AST 98* 57*  ALT 81* 54*  ALKPHOS 74 57  BILITOT 0.5 1.2  PROT 8.7* 8.0  ALBUMIN 3.2* 2.6*   No results for input(s): LIPASE, AMYLASE in the last 168 hours. No results for input(s): AMMONIA in the last 168 hours. Coagulation Profile: No results for input(s): INR, PROTIME in the last 168 hours. Cardiac Enzymes: No results for input(s): CKTOTAL, CKMB, CKMBINDEX, TROPONINI in the last 168 hours. BNP (last 3 results) No results for input(s): PROBNP in the last 8760 hours. HbA1C: No results for input(s): HGBA1C in the last 72 hours. CBG: Recent Labs  Lab 12/28/19 1149 12/28/19 1654 12/28/19 2212 12/29/19 0622 12/29/19 1133  GLUCAP 100* 113* 102* 78 86   Lipid Profile: No results for input(s): CHOL, HDL, LDLCALC, TRIG, CHOLHDL, LDLDIRECT in the last 72 hours. Thyroid Function Tests: No results for input(s): TSH, T4TOTAL, FREET4, T3FREE, THYROIDAB in the last 72 hours. Anemia Panel: No results for input(s): VITAMINB12, FOLATE, FERRITIN, TIBC, IRON, RETICCTPCT in the last 72 hours. Urine analysis:    Component Value Date/Time   COLORURINE YELLOW 12/27/2019 1003   APPEARANCEUR HAZY (A) 12/27/2019 1003   LABSPEC 1.019 12/27/2019 1003   PHURINE 5.0 12/27/2019 1003   GLUCOSEU NEGATIVE 12/27/2019 1003   HGBUR MODERATE (A) 12/27/2019 1003   BILIRUBINUR NEGATIVE 12/27/2019 1003   KETONESUR 5 (A) 12/27/2019 1003   PROTEINUR  100 (A) 12/27/2019 1003   NITRITE NEGATIVE 12/27/2019 1003   LEUKOCYTESUR NEGATIVE 12/27/2019 1003   Sepsis Labs: @LABRCNTIP (procalcitonin:4,lacticidven:4)  ) Recent Results (from the past 240 hour(s))  SARS Coronavirus 2 by RT PCR (hospital order, performed in [facility] hospital lab) Nasopharyngeal Nasopharyngeal Swab     Status: None   Collection Time: 12/27/19  6:46 AM   Specimen: Nasopharyngeal Swab  Result Value Ref Range Status   SARS Coronavirus 2 NEGATIVE NEGATIVE Final    Comment: (NOTE) SARS-CoV-2 target nucleic acids are NOT DETECTED. The SARS-CoV-2 RNA is generally detectable in upper and lower respiratory specimens during the acute phase of infection. The lowest concentration of SARS-CoV-2 viral copies this assay can detect is 250 copies / mL. A negative result does not preclude SARS-CoV-2 infection and should not be  used as the sole basis for treatment or other patient management decisions.  A negative result may occur with improper specimen collection / handling, submission of specimen other than nasopharyngeal swab, presence of viral mutation(s) within the areas targeted by this assay, and inadequate number of viral copies (<250 copies / mL). A negative result must be combined with clinical observations, patient history, and epidemiological information. Fact Sheet for Patients:   StrictlyIdeas.no Fact Sheet for Healthcare Providers: BankingDealers.co.za This test is not yet approved or cleared  by the [place] FDA and has been authorized for detection and/or diagnosis of SARS-CoV-2 by FDA under an Emergency Use Authorization (EUA).  This EUA will remain in effect (meaning this test can be used) for the duration of the COVID-19 declaration under Section 564(b)(1) of the Act, 21 U.S.C. section 360bbb-3(b)(1), unless the authorization is terminated or revoked sooner. Performed at [facility], [place]  [address] [place], [place] [address]   Blood culture (routine x 2)     Status: None (Preliminary result)   Collection Time: 12/27/19  6:46 AM   Specimen: BLOOD LEFT HAND  Result Value Ref Range Status   Specimen Description BLOOD LEFT HAND  Final   Special Requests   Final    BOTTLES DRAWN AEROBIC AND ANAEROBIC Blood Culture results may not be optimal due to an inadequate volume of blood received in culture bottles   Culture   Final    NO GROWTH 2 DAYS Performed at [facility], [place] [address] [place], [place] [address]    Report Status PENDING  Incomplete  Blood culture (routine x 2)     Status: None (Preliminary result)   Collection Time: 12/27/19  6:51 AM   Specimen: BLOOD RIGHT HAND  Result Value Ref Range Status   Specimen Description BLOOD RIGHT HAND  Final   Special Requests   Final    BOTTLES DRAWN AEROBIC AND ANAEROBIC Blood Culture adequate volume   Culture   Final    NO GROWTH 2 DAYS Performed at [facility], [place] [address], [place], [place] [address]    Report Status PENDING  Incomplete  Culture, Urine     Status: Abnormal   Collection Time: 12/27/19  9:32 AM   Specimen: Urine, Random  Result Value Ref Range Status   Specimen Description URINE, RANDOM  Final   Special Requests NONE  Final   Culture (A)  Final    >=100,000 COLONIES/mL KLEBSIELLA PNEUMONIAE Two isolates with different morphologies were identified as the same organism.The most resistant organism was reported. Performed at [facility], [place] [address] [place], [place] [address]    Report Status 12/29/2019 FINAL  Final   Organism ID, Bacteria KLEBSIELLA PNEUMONIAE (A)  Final      Susceptibility   Klebsiella pneumoniae - MIC*    AMPICILLIN >=32 RESISTANT Resistant     CEFAZOLIN <=4 SENSITIVE Sensitive     CEFTRIAXONE <=1 SENSITIVE Sensitive     CIPROFLOXACIN 2 INTERMEDIATE Intermediate     GENTAMICIN <=1 SENSITIVE Sensitive     IMIPENEM <=0.25 SENSITIVE Sensitive     NITROFURANTOIN  128 RESISTANT Resistant     TRIMETH/SULFA <=20 SENSITIVE Sensitive     AMPICILLIN/SULBACTAM 4 SENSITIVE Sensitive     PIP/TAZO <=4 SENSITIVE Sensitive     * >=100,000 COLONIES/mL KLEBSIELLA PNEUMONIAE         Radiology Studies: DG CHEST PORT 1 VIEW  Result Date: 12/28/2019 CLINICAL DATA:  Shortness of breath.  EXAM: PORTABLE CHEST 1 VIEW COMPARISON:  Radiograph yesterday, radiograph 12/10/2019. FINDINGS: Unchanged volume loss in the right hemithorax. Unchanged patchy and streaky opacity at the right lung base. Central bronchial thickening again seen. No new airspace disease. No pleural fluid or pneumothorax. Normal heart size and mediastinal contours. Stable osseous structures. IMPRESSION: Unchanged appearance of the chest since yesterday. Patchy and streaky right lung base opacity with volume loss dishes for pneumonia and atelectasis. Electronically Signed   By: Narda Rutherford M.D.   On: 12/28/2019 00:40        Scheduled Meds: . atorvastatin  40 mg Oral Daily  . bictegravir-emtricitabine-tenofovir AF  1 tablet Oral Daily  . docusate sodium  100 mg Oral BID  . enoxaparin (LOVENOX) injection  40 mg Subcutaneous Q24H  . fluconazole  200 mg Oral Daily  . folic acid  1 mg Oral Daily  . insulin aspart  0-9 Units Subcutaneous TID WC  . multivitamin with minerals  1 tablet Oral Daily  . nicotine  14 mg Transdermal Daily  . potassium chloride  40 mEq Oral BID  . QUEtiapine  100 mg Oral QHS  . sodium chloride flush  3 mL Intravenous Q12H  . thiamine  100 mg Oral Daily   Or  . thiamine  100 mg Intravenous Daily   Continuous Infusions: . azithromycin (ZITHROMAX) 500 MG IVPB (Vial-Mate [employer]or) 500 mg (12/29/19 0649)  . cefTRIAXone (ROCEPHIN)  IV 1 g (12/29/19 0612)     LOS: 2 days    Time spent:    Zannie Cove, MD [facility] Page via [email or web address], password [email or web address] After 7PM please contact night-coverage  12/29/2019, 12:26 PM

## 2019-12-30 ENCOUNTER — Inpatient Hospital Stay (HOSPITAL_COMMUNITY): Payer: Medicaid Other

## 2019-12-30 DIAGNOSIS — F1910 Other psychoactive substance abuse, uncomplicated: Secondary | ICD-10-CM

## 2019-12-30 DIAGNOSIS — F200 Paranoid schizophrenia: Secondary | ICD-10-CM

## 2019-12-30 DIAGNOSIS — S02652A Fracture of angle of left mandible, initial encounter for closed fracture: Secondary | ICD-10-CM

## 2019-12-30 LAB — COMPREHENSIVE METABOLIC PANEL
ALT: 51 U/L — ABNORMAL HIGH (ref 0–44)
AST: 52 U/L — ABNORMAL HIGH (ref 15–41)
Albumin: 2.6 g/dL — ABNORMAL LOW (ref 3.5–5.0)
Alkaline Phosphatase: 62 U/L (ref 38–126)
Anion gap: 9 (ref 5–15)
BUN: 10 mg/dL (ref 6–20)
CO2: 24 mmol/L (ref 22–32)
Calcium: 9.3 mg/dL (ref 8.9–10.3)
Chloride: 101 mmol/L (ref 98–111)
Creatinine, Ser: 0.7 mg/dL (ref 0.61–1.24)
GFR calc Af Amer: >60 mL/min (ref >60)
GFR calc non Af Amer: >60 mL/min (ref >60)
Glucose, Bld: 122 mg/dL — ABNORMAL HIGH (ref 70–99)
Potassium: 3.6 mmol/L (ref 3.5–5.1)
Sodium: 134 mmol/L — ABNORMAL LOW (ref 135–145)
Total Bilirubin: 0.6 mg/dL (ref 0.3–1.2)
Total Protein: 7.9 g/dL (ref 6.5–8.1)

## 2019-12-30 LAB — CBC
HCT: 36.2 % — ABNORMAL LOW (ref 39.0–52.0)
Hemoglobin: 12.7 g/dL — ABNORMAL LOW (ref 13.0–17.0)
MCH: 32.1 pg (ref 26.0–34.0)
MCHC: 35.1 g/dL (ref 30.0–36.0)
MCV: 91.4 fL (ref 80.0–100.0)
Platelets: 125 10*3/uL — ABNORMAL LOW (ref 150–400)
RBC: 3.96 MIL/uL — ABNORMAL LOW (ref 4.22–5.81)
RDW: 12.2 % (ref 11.5–15.5)
WBC: 4.2 10*3/uL (ref 4.0–10.5)
nRBC: 0 % (ref 0.0–0.2)

## 2019-12-30 LAB — GLUCOSE, CAPILLARY
Glucose-Capillary: 107 mg/dL — ABNORMAL HIGH (ref 70–99)
Glucose-Capillary: 103 mg/dL — ABNORMAL HIGH (ref 70–99)
Glucose-Capillary: 174 mg/dL — ABNORMAL HIGH (ref 70–99)
Glucose-Capillary: 69 mg/dL — ABNORMAL LOW (ref 70–99)

## 2019-12-30 LAB — MAGNESIUM: Magnesium: 1.6 mg/dL — ABNORMAL LOW (ref 1.7–2.4)

## 2019-12-30 LAB — LEGIONELLA PNEUMOPHILA SEROGP 1 UR AG: L. pneumophila Serogp 1 Ur Ag: NEGATIVE

## 2019-12-30 MED ORDER — SODIUM CHLORIDE 0.9 % IV SOLN
INTRAVENOUS | Status: DC | PRN
  Administered 2019-12-30 – 2020-01-06 (×4): 250 mL via INTRAVENOUS

## 2019-12-30 MED ORDER — MAGNESIUM SULFATE 2 GM/50ML IV SOLN
2.0000 g | Freq: Once | INTRAVENOUS | Status: AC
  Administered 2019-12-30: 2 g via INTRAVENOUS
  Filled 2019-12-30: qty 50

## 2019-12-30 NOTE — Progress Notes (Signed)
Patient awake c/a/ox4 during shift report, denies complaints.
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
## 2019-12-30 NOTE — Plan of Care (Signed)
  Problem: Education: Goal: Knowledge of General Education information will improve Description Including pain rating scale, medication(s)/side effects and non-pharmacologic comfort measures Outcome: Progressing   

## 2019-12-30 NOTE — Progress Notes (Signed)
Pt informed of CT Face order and new NPO status.   CT called for patient, ok'd to send for him.
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
## 2019-12-30 NOTE — Progress Notes (Signed)
Due to mid-day change in diet order pt received a late lunch tray off-schedule, unbeknownst to staff. CBG obtained was s/p meal.
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
## 2019-12-30 NOTE — Anesthesia Preprocedure Evaluation (Addendum)
Anesthesia Evaluation  Patient identified by MRN, date of birth, ID band Patient awake    Reviewed: Allergy & Precautions, NPO status , Patient's Chart, lab work & pertinent test results  History of Anesthesia Complications Negative for: history of anesthetic complications  Airway Mallampati: II  TM Distance: >3 FB Neck ROM: Full    Dental  (+) Edentulous Upper, Poor Dentition, Missing,    Pulmonary pneumonia, unresolved, Current Smoker,    Pulmonary exam normal        Cardiovascular negative cardio ROS Normal cardiovascular exam     Neuro/Psych Depression Bipolar Disorder Schizophrenia negative neurological ROS     GI/Hepatic negative GI ROS, (+)     substance abuse (last EtOH 5 days ago)  alcohol use, cocaine use, marijuana use and methamphetamine use, Hepatitis -, C  Endo/Other  diabetes, Type 2, Insulin Dependent, Oral Hypoglycemic Agents  Renal/GU negative Renal ROS  negative genitourinary   Musculoskeletal negative musculoskeletal ROS (+)   Abdominal   Peds  Hematology  (+) HIV, Plt 125   Anesthesia Other Findings Day of surgery medications reviewed with patient.  Reproductive/Obstetrics negative OB ROS                            Anesthesia Physical Anesthesia Plan  ASA: III  Anesthesia Plan: General   Post-op Pain Management:    Induction: Intravenous  PONV Risk Score and Plan: 3 and Treatment may vary due to age or medical condition, Ondansetron, Dexamethasone and Midazolam  Airway Management Planned: Nasal ETT and Video Laryngoscope Planned  Additional Equipment: None  Intra-op Plan:   Post-operative Plan: Extubation in OR  Informed Consent: I have reviewed the patients History and Physical, chart, labs and discussed the procedure including the risks, benefits and alternatives for the proposed anesthesia with the patient or authorized representative who has  indicated his/her understanding and acceptance.     Dental advisory given  Plan Discussed with: CRNA  Anesthesia Plan Comments:        Anesthesia Quick Evaluation

## 2019-12-30 NOTE — Evaluation (Signed)
Physical Therapy Evaluation Patient Details Name: Derrick Cooper MRN: [id number] DOB: 19-Feb-1961 Today's Date: 12/30/2019   History of Present Illness   Derrick Cooper  59 year old homeless male who sleeps outside [facility] with medical history significant of schizophrenia; HIV; DM; and polysubstance abuse, ETOH abuse presened to ED with ETOH intoxication.  Clinical Impression  Pt admitted with above diagnosis. Pt was able to ambulate with supervision with RW with good safety overall. Did appear to need the RW for support and pt interested in rollator to use in community for safety.  Should progress well.   Pt currently with functional limitations due to the deficits listed below (see PT Problem List). Pt will benefit from skilled PT to increase their independence and safety with mobility to allow discharge to the venue listed below.      Follow Up Recommendations No PT follow up;Supervision - Intermittent    Equipment Recommendations  Other (comment)(rollator)    Recommendations for Other Services       Precautions / Restrictions Precautions Precautions: Fall Restrictions Weight Bearing Restrictions: No      Mobility  Bed Mobility Overal bed mobility: Independent                Transfers Overall transfer level: Independent                  Ambulation/Gait Ambulation/Gait assistance: Supervision;Min guard;Min assist Gait Distance (Feet): 450 Feet Assistive device: Rolling walker (2 wheeled) Gait Pattern/deviations: Step-through pattern;Decreased stride length   Gait velocity interpretation: 1.31 - 2.62 ft/sec, indicative of limited community ambulator General Gait Details: Pt able to ambulate safely with RW but was unsteady without device needing min aasist without device.   Stairs            Wheelchair Mobility    Modified Rankin (Stroke Patients Only)       Balance Overall balance assessment: Needs assistance Sitting-balance support: No upper  extremity supported;Feet supported Sitting balance-Leahy Scale: Fair     Standing balance support: Bilateral upper extremity supported;During functional activity;No upper extremity supported Standing balance-Leahy Scale: Poor Standing balance comment: relies on UE support for balance.                              Pertinent Vitals/Pain Pain Assessment: No/denies pain    Home Living Family/patient expects to be discharged to:: Unsure     Type of Home: Homeless           Additional Comments: lives on street per pt    Prior Function Level of Independence: Independent               Hand Dominance        Extremity/Trunk Assessment   Upper Extremity Assessment Upper Extremity Assessment: Defer to OT evaluation    Lower Extremity Assessment Lower Extremity Assessment: Generalized weakness    Cervical / Trunk Assessment Cervical / Trunk Assessment: Normal  Communication   Communication: No difficulties  Cognition Arousal/Alertness: Awake/alert Behavior During Therapy: WFL for tasks assessed/performed Overall Cognitive Status: History of cognitive impairments - at baseline                                        General Comments      Exercises     Assessment/Plan    PT Assessment Patient needs continued PT services  PT Problem List Decreased activity tolerance;Decreased balance;Decreased mobility;Decreased knowledge of use of DME;Decreased safety awareness;Decreased knowledge of precautions;Cardiopulmonary status limiting activity       PT Treatment Interventions DME instruction;Gait training;Functional mobility training;Therapeutic activities;Therapeutic exercise;Balance training;Patient/family education    PT Goals (Current goals can be found in the Care Plan section)  Acute Rehab PT Goals Patient Stated Goal: to go back to the street PT Goal Formulation: With patient Time For Goal Achievement: 01/13/20 Potential to  Achieve Goals: Good    Frequency Min 3X/week   Barriers to discharge Decreased caregiver support      Co-evaluation               AM-PAC PT "6 Clicks" Mobility  Outcome Measure Help needed turning from your back to your side while in a flat bed without using bedrails?: None Help needed moving from lying on your back to sitting on the side of a flat bed without using bedrails?: None Help needed moving to and from a bed to a chair (including a wheelchair)?: A Little Help needed standing up from a chair using your arms (e.g., wheelchair or bedside chair)?: A Little Help needed to walk in hospital room?: A Little Help needed climbing 3-5 steps with a railing? : A Little 6 Click Score: 20    End of Session Equipment Utilized During Treatment: Gait belt Activity Tolerance: Patient limited by fatigue Patient left: with call bell/phone within reach;in bed Nurse Communication: Mobility status PT Visit Diagnosis: Muscle weakness (generalized) (M62.81)    Time: 1700-1749 PT Time Calculation (min) (ACUTE ONLY): 18 min   Charges:   PT Evaluation $PT Eval Moderate Complexity: 1 Mod          Derrick Cooper W,PT Acute Rehabilitation Services Pager:  [phone]  Office:  [phone]    Denice Paradise 12/30/2019, 12:27 PM

## 2019-12-31 ENCOUNTER — Encounter (HOSPITAL_COMMUNITY)
Admission: EM | Disposition: A | Discharge status: Home / Self Care | Payer: Self-pay | Source: Home / Self Care | Attending: Internal Medicine

## 2019-12-31 ENCOUNTER — Inpatient Hospital Stay (HOSPITAL_COMMUNITY): Payer: Medicaid Other | Admitting: Anesthesiology

## 2019-12-31 DIAGNOSIS — E119 Type 2 diabetes mellitus without complications: Secondary | ICD-10-CM

## 2019-12-31 DIAGNOSIS — B370 Candidal stomatitis: Secondary | ICD-10-CM

## 2019-12-31 DIAGNOSIS — Z590 Homelessness: Secondary | ICD-10-CM

## 2019-12-31 DIAGNOSIS — F209 Schizophrenia, unspecified: Secondary | ICD-10-CM

## 2019-12-31 HISTORY — PX: ORIF MANDIBULAR FRACTURE: SHX2127

## 2019-12-31 LAB — BASIC METABOLIC PANEL
Anion gap: 6 (ref 5–15)
BUN: 10 mg/dL (ref 6–20)
CO2: 28 mmol/L (ref 22–32)
Calcium: 9.1 mg/dL (ref 8.9–10.3)
Chloride: 100 mmol/L (ref 98–111)
Creatinine, Ser: 0.78 mg/dL (ref 0.61–1.24)
GFR calc Af Amer: >60 mL/min (ref >60)
GFR calc non Af Amer: >60 mL/min (ref >60)
Glucose, Bld: 126 mg/dL — ABNORMAL HIGH (ref 70–99)
Potassium: 3.9 mmol/L (ref 3.5–5.1)
Sodium: 134 mmol/L — ABNORMAL LOW (ref 135–145)

## 2019-12-31 LAB — GLUCOSE, CAPILLARY
Glucose-Capillary: 114 mg/dL — ABNORMAL HIGH (ref 70–99)
Glucose-Capillary: 128 mg/dL — ABNORMAL HIGH (ref 70–99)
Glucose-Capillary: 139 mg/dL — ABNORMAL HIGH (ref 70–99)
Glucose-Capillary: 139 mg/dL — ABNORMAL HIGH (ref 70–99)
Glucose-Capillary: 113 mg/dL — ABNORMAL HIGH (ref 70–99)

## 2019-12-31 LAB — CBC
HCT: 37.2 % — ABNORMAL LOW (ref 39.0–52.0)
Hemoglobin: 12.5 g/dL — ABNORMAL LOW (ref 13.0–17.0)
MCH: 30.8 pg (ref 26.0–34.0)
MCHC: 33.6 g/dL (ref 30.0–36.0)
MCV: 91.6 fL (ref 80.0–100.0)
Platelets: 154 10*3/uL (ref 150–400)
RBC: 4.06 MIL/uL — ABNORMAL LOW (ref 4.22–5.81)
RDW: 12.4 % (ref 11.5–15.5)
WBC: 4.1 10*3/uL (ref 4.0–10.5)
nRBC: 0 % (ref 0.0–0.2)

## 2019-12-31 LAB — T-HELPER CELLS (CD4) COUNT (NOT AT ARMC)
CD4 % Helper T Cell: 17 % — ABNORMAL LOW (ref 33–65)
CD4 T Cell Abs: 339 /uL — ABNORMAL LOW (ref 400–1790)

## 2019-12-31 LAB — SURGICAL PCR SCREEN
MRSA, PCR: NEGATIVE
Staphylococcus aureus: NEGATIVE

## 2019-12-31 LAB — MAGNESIUM: Magnesium: 1.8 mg/dL (ref 1.7–2.4)

## 2019-12-31 SURGERY — OPEN REDUCTION INTERNAL FIXATION (ORIF) MANDIBULAR FRACTURE
Anesthesia: General | Site: Mouth | Laterality: Left

## 2019-12-31 MED ORDER — SODIUM CHLORIDE 0.9 % IV SOLN
INTRAVENOUS | Status: DC | PRN
  Administered 2019-12-31: 1.5 g via INTRAVENOUS

## 2019-12-31 MED ORDER — SODIUM CHLORIDE 0.9 % IV SOLN
1.5000 g | Freq: Three times a day (TID) | INTRAVENOUS | Status: DC
  Administered 2019-12-31 – 2020-01-06 (×19): 1.5 g via INTRAVENOUS
  Filled 2019-12-31: qty 1.5
  Filled 2019-12-31 (×3): qty 4
  Filled 2019-12-31: qty 1.5
  Filled 2019-12-31: qty 4
  Filled 2019-12-31: qty 1.5
  Filled 2019-12-31: qty 4
  Filled 2019-12-31: qty 1.5
  Filled 2019-12-31: qty 4
  Filled 2019-12-31 (×2): qty 1.5
  Filled 2019-12-31 (×7): qty 4
  Filled 2019-12-31: qty 1.5

## 2019-12-31 MED ORDER — PROPOFOL 10 MG/ML IV BOLUS
INTRAVENOUS | Status: AC
  Filled 2019-12-31: qty 40

## 2019-12-31 MED ORDER — FENTANYL CITRATE (PF) 250 MCG/5ML IJ SOLN
INTRAMUSCULAR | Status: DC | PRN
  Administered 2019-12-31 (×3): 50 ug via INTRAVENOUS
  Administered 2019-12-31: 25 ug via INTRAVENOUS
  Administered 2019-12-31: 50 ug via INTRAVENOUS

## 2019-12-31 MED ORDER — 0.9 % SODIUM CHLORIDE (POUR BTL) OPTIME
TOPICAL | Status: DC | PRN
  Administered 2019-12-31: 1000 mL

## 2019-12-31 MED ORDER — INSULIN ASPART 100 UNIT/ML ~~LOC~~ SOLN
0.0000 [IU] | Freq: Three times a day (TID) | SUBCUTANEOUS | Status: DC
  Administered 2019-12-31 – 2020-01-01 (×3): 1 [IU] via SUBCUTANEOUS
  Administered 2020-01-01: 2 [IU] via SUBCUTANEOUS
  Administered 2020-01-02 (×3): 1 [IU] via SUBCUTANEOUS
  Administered 2020-01-03: 2 [IU] via SUBCUTANEOUS
  Administered 2020-01-03: 1 [IU] via SUBCUTANEOUS
  Administered 2020-01-04 (×2): 2 [IU] via SUBCUTANEOUS
  Administered 2020-01-05 – 2020-01-06 (×3): 1 [IU] via SUBCUTANEOUS

## 2019-12-31 MED ORDER — FENTANYL CITRATE (PF) 250 MCG/5ML IJ SOLN
INTRAMUSCULAR | Status: AC
  Filled 2019-12-31: qty 5

## 2019-12-31 MED ORDER — OXYCODONE HCL 5 MG/5ML PO SOLN: 5.0000 mg | Freq: Once | ORAL | Status: DC | PRN

## 2019-12-31 MED ORDER — DEXAMETHASONE SODIUM PHOSPHATE 10 MG/ML IJ SOLN
INTRAMUSCULAR | Status: AC
  Filled 2019-12-31: qty 1

## 2019-12-31 MED ORDER — PROMETHAZINE HCL 25 MG/ML IJ SOLN: 6.2500 mg | INTRAMUSCULAR | Status: DC | PRN

## 2019-12-31 MED ORDER — ONDANSETRON HCL 4 MG/2ML IJ SOLN
INTRAMUSCULAR | Status: AC
  Filled 2019-12-31: qty 2

## 2019-12-31 MED ORDER — ONDANSETRON HCL 4 MG/2ML IJ SOLN
INTRAMUSCULAR | Status: DC | PRN
  Administered 2019-12-31: 4 mg via INTRAVENOUS

## 2019-12-31 MED ORDER — SUGAMMADEX SODIUM 200 MG/2ML IV SOLN
INTRAVENOUS | Status: DC | PRN
  Administered 2019-12-31: 200 mg via INTRAVENOUS

## 2019-12-31 MED ORDER — LIDOCAINE 2% (20 MG/ML) 5 ML SYRINGE
INTRAMUSCULAR | Status: AC
  Filled 2019-12-31: qty 5

## 2019-12-31 MED ORDER — GLUCERNA SHAKE PO LIQD
237.0000 mL | Freq: Three times a day (TID) | ORAL | Status: DC
  Administered 2020-01-01 – 2020-01-06 (×14): 237 mL via ORAL
  Filled 2019-12-31 (×2): qty 237

## 2019-12-31 MED ORDER — DEXAMETHASONE SODIUM PHOSPHATE 10 MG/ML IJ SOLN
INTRAMUSCULAR | Status: DC | PRN
  Administered 2019-12-31: 5 mg via INTRAVENOUS

## 2019-12-31 MED ORDER — MIDAZOLAM HCL 2 MG/2ML IJ SOLN
INTRAMUSCULAR | Status: AC
  Filled 2019-12-31: qty 2

## 2019-12-31 MED ORDER — MIDAZOLAM HCL 5 MG/5ML IJ SOLN
INTRAMUSCULAR | Status: DC | PRN
  Administered 2019-12-31 (×2): 1 mg via INTRAVENOUS

## 2019-12-31 MED ORDER — LIDOCAINE 2% (20 MG/ML) 5 ML SYRINGE
INTRAMUSCULAR | Status: DC | PRN
  Administered 2019-12-31: 80 mg via INTRAVENOUS

## 2019-12-31 MED ORDER — PROPOFOL 10 MG/ML IV BOLUS
INTRAVENOUS | Status: DC | PRN
  Administered 2019-12-31: 150 mg via INTRAVENOUS
  Administered 2019-12-31: 50 mg via INTRAVENOUS

## 2019-12-31 MED ORDER — PHENYLEPHRINE 40 MCG/ML (10ML) SYRINGE FOR IV PUSH (FOR BLOOD PRESSURE SUPPORT)
PREFILLED_SYRINGE | INTRAVENOUS | Status: DC | PRN
  Administered 2019-12-31 (×2): 80 ug via INTRAVENOUS
  Administered 2019-12-31: 40 ug via INTRAVENOUS
  Administered 2019-12-31: 80 ug via INTRAVENOUS
  Administered 2019-12-31 (×2): 40 ug via INTRAVENOUS
  Administered 2019-12-31: 80 ug via INTRAVENOUS
  Administered 2019-12-31: 120 ug via INTRAVENOUS

## 2019-12-31 MED ORDER — LACTATED RINGERS IV SOLN: INTRAVENOUS | Status: DC | PRN

## 2019-12-31 MED ORDER — LIDOCAINE-EPINEPHRINE 1 %-1:100000 IJ SOLN
INTRAMUSCULAR | Status: AC
  Filled 2019-12-31: qty 1

## 2019-12-31 MED ORDER — ROCURONIUM BROMIDE 10 MG/ML (PF) SYRINGE
PREFILLED_SYRINGE | INTRAVENOUS | Status: AC
  Filled 2019-12-31: qty 10

## 2019-12-31 MED ORDER — LIDOCAINE-EPINEPHRINE 1 %-1:100000 IJ SOLN
INTRAMUSCULAR | Status: DC | PRN
  Administered 2019-12-31: 5 mL

## 2019-12-31 MED ORDER — PROPOFOL 10 MG/ML IV BOLUS
INTRAVENOUS | Status: AC
  Filled 2019-12-31: qty 20

## 2019-12-31 MED ORDER — FENTANYL CITRATE (PF) 100 MCG/2ML IJ SOLN: 25.0000 ug | INTRAMUSCULAR | Status: DC | PRN

## 2019-12-31 MED ORDER — PHENYLEPHRINE HCL (PRESSORS) 10 MG/ML IV SOLN
INTRAVENOUS | Status: DC | PRN
Start: 2019-12-31 — End: 2019-12-31

## 2019-12-31 MED ORDER — PHENYLEPHRINE 40 MCG/ML (10ML) SYRINGE FOR IV PUSH (FOR BLOOD PRESSURE SUPPORT)
PREFILLED_SYRINGE | INTRAVENOUS | Status: AC
  Filled 2019-12-31: qty 20

## 2019-12-31 MED ORDER — OXYCODONE HCL 5 MG PO TABS: 5.0000 mg | ORAL_TABLET | Freq: Once | ORAL | Status: DC | PRN

## 2019-12-31 MED ORDER — OXYMETAZOLINE HCL 0.05 % NA SOLN
NASAL | Status: DC | PRN
Start: 2019-12-31 — End: 2019-12-31
  Administered 2019-12-31: 2 via NASAL

## 2019-12-31 MED ORDER — SODIUM CHLORIDE 0.9 % IV SOLN: 1.0000 g | INTRAVENOUS | Status: DC

## 2019-12-31 MED ORDER — OXYMETAZOLINE HCL 0.05 % NA SOLN
NASAL | Status: AC
  Filled 2019-12-31: qty 30

## 2019-12-31 MED ORDER — ROCURONIUM BROMIDE 100 MG/10ML IV SOLN
INTRAVENOUS | Status: DC | PRN
  Administered 2019-12-31: 10 mg via INTRAVENOUS
  Administered 2019-12-31: 50 mg via INTRAVENOUS
  Administered 2019-12-31: 10 mg via INTRAVENOUS

## 2019-12-31 SURGICAL SUPPLY — 49 items
BIT DRILL TWIST 1.6X58MM (BIT) IMPLANT
BLADE SURG 15 STRL LF DISP TIS (BLADE) IMPLANT
BLADE SURG 15 STRL SS (BLADE)
CANISTER SUCT 3000ML PPV (MISCELLANEOUS) ×3 IMPLANT
CLEANER TIP ELECTROSURG 2X2 (MISCELLANEOUS) ×3 IMPLANT
DRAPE HALF SHEET 40X57 (DRAPES) IMPLANT
DRILL TWIST 1.6X58MM (BIT) ×3
ELECT COATED BLADE 2.86 ST (ELECTRODE) ×3 IMPLANT
ELECT NDL TIP 2.8 STRL (NEEDLE) IMPLANT
ELECT NEEDLE TIP 2.8 STRL (NEEDLE) IMPLANT
ELECT REM PT RETURN 9FT ADLT (ELECTROSURGICAL) ×3
ELECTRODE REM PT RTRN 9FT ADLT (ELECTROSURGICAL) ×1 IMPLANT
GLOVE BIOGEL M 7.0 STRL (GLOVE) ×3 IMPLANT
GOWN STRL REUS W/ TWL LRG LVL3 (GOWN DISPOSABLE) ×2 IMPLANT
GOWN STRL REUS W/TWL LRG LVL3 (GOWN DISPOSABLE) ×6
KIT BASIN OR (CUSTOM PROCEDURE TRAY) ×3 IMPLANT
KIT TURNOVER KIT B (KITS) ×3 IMPLANT
NDL HYPO 25GX1X1/2 BEV (NEEDLE) IMPLANT
NEEDLE HYPO 25GX1X1/2 BEV (NEEDLE) IMPLANT
NS IRRIG 1000ML POUR BTL (IV SOLUTION) ×3 IMPLANT
PAD ARMBOARD 7.5X6 YLW CONV (MISCELLANEOUS) ×6 IMPLANT
PATTIES SURGICAL .5 X3 (DISPOSABLE) IMPLANT
PENCIL BUTTON HOLSTER BLD 10FT (ELECTRODE) ×3 IMPLANT
PLATE 4 H MINI W/BAR (Plate) ×2 IMPLANT
PLATE MNDBLE MINI 6H (Plate) ×2 IMPLANT
SCISSORS WIRE ANG 4 3/4 DISP (INSTRUMENTS) ×3 IMPLANT
SCREW BONE CROSS PIN 2.0X05MM (Screw) ×2 IMPLANT
SCREW BONE CROSS PIN 2.0X10MM (Screw) ×8 IMPLANT
SCREW MNDBLE 2.0X6 BONE (Screw) ×2 IMPLANT
SCREW MNDBLE 2.0X8 BONE (Screw) ×4 IMPLANT
SCREW MNDBLE 2.3X8MM BONE (Screw) ×2 IMPLANT
STAPLER VISISTAT 35W (STAPLE) ×3 IMPLANT
SUT BONE WAX W31G (SUTURE) IMPLANT
SUT CHROMIC 3 0 SH 27 (SUTURE) ×3 IMPLANT
SUT ETHILON 3 0 PS 1 (SUTURE) IMPLANT
SUT SILK 3 0 (SUTURE)
SUT SILK 3 0 SH 30 (SUTURE) ×3 IMPLANT
SUT SILK 3-0 18XBRD TIE 12 (SUTURE) IMPLANT
SUT STEEL 0 (SUTURE)
SUT STEEL 0 18XMFL TIE 17 (SUTURE) IMPLANT
SUT STEEL 2 (SUTURE) IMPLANT
SUT VIC AB 3-0 FS2 27 (SUTURE) IMPLANT
SUT VIC AB 3-0 SH 27 (SUTURE) ×6
SUT VIC AB 3-0 SH 27X BRD (SUTURE) IMPLANT
SUT VIC AB 4-0 P-3 18X BRD (SUTURE) IMPLANT
SUT VIC AB 4-0 P3 18 (SUTURE)
TOWEL GREEN STERILE FF (TOWEL DISPOSABLE) ×3 IMPLANT
TRAY ENT MC OR (CUSTOM PROCEDURE TRAY) ×3 IMPLANT
WATER STERILE IRR 1000ML POUR (IV SOLUTION) ×3 IMPLANT

## 2019-12-31 NOTE — Brief Op Note (Signed)
12/31/2019  9:35 AM  PATIENT:  Derrick Cooper  59 y.o. male  PRE-OPERATIVE DIAGNOSIS:  mandible fracture  POST-OPERATIVE DIAGNOSIS:  LEFT MANDIBLE ANGLE FRACTURE  PROCEDURE:  ORIF with rigid fixation left mandible angle fracture  SURGEON:  Surgeon(s) and Role:    * [name], MD - Primary  PHYSICIAN ASSISTANT:   ASSISTANTS: none   ANESTHESIA:   general  EBL:  50 mL   BLOOD ADMINISTERED:none  DRAINS: none   LOCAL MEDICATIONS USED:  LIDOCAINE   SPECIMEN:  No Specimen  DISPOSITION OF SPECIMEN:  N/A  COUNTS:  YES  TOURNIQUET:  * No tourniquets in log *  DICTATION:  Via phone (dragon not working)  PLAN OF CARE: Admit to inpatient   PATIENT DISPOSITION:  Stable to PACU

## 2019-12-31 NOTE — Op Note (Signed)
Procedure(s): OPEN REDUCTION INTERNAL FIXATION (ORIF) MANDIBULAR FRACTURE  Derrick Cooper male 59 y.o. 12/31/2019  Procedure(s) and Anesthesia Type:    * OPEN REDUCTION INTERNAL FIXATION (ORIF) MANDIBULAR FRACTURE  Surgeon(s) and Role:    * [name], MD - Primary   Indications: This patient presented to the hospital 4 days ago.  I was consulted yesterday for jaw pain.  A CT scan showed a left minimally displaced mandibular angle fracture.        Surgeon: [name]   Assistants: none  Anesthesia: General endotracheal anesthesia  ASA Class: none    Procedure Detail  OPEN REDUCTION INTERNAL FIXATION (ORIF) MANDIBULAR FRACTURE , NASAL  MAXILLO MANDIBULAR FIXATION  Findings: The informed consent process began yesterday and continued today ending with the patient signing the surgical consent form.  In that process I was clear that he is at high risk for nonunion given the location of the fracture, his poor nutritional status, and his polysubstance abuse.  The patient also is a chronic alcoholic and homeless making social issues of concern with regards to maxillomandibular fixation.  This is also a risk due to the patient's history of psychosis.  The patient is edentulous with several teeth only on the mandible.  Given all of these challenges and his poorly controlled HIV disease, I felt it best to approach the fracture intraorally and place 2 mini plates over the fracture line.  Patient was nasally intubated without difficulty.  His left neck was prepped and draped in sterile fashion just in case we had to change over to an external approach.  The mouth was also prepped with Betadine solution.  5 cc of 1% lidocaine with 1 100,000 epinephrine were injected into the gingivobuccal sulcus and retromolar trigone on the left.  After an adequate amount of time had been given for hemostasis, an incision was made with the knife.  Dissection was carried out down to the level of the  mandibular body identifying the lingual sensory nerve and preserving it as the dissection continued up the inferior ramus.  The fracture was identified in the anterior segment was displaced inferiorly.  The mandible fracture was reduced nicely and a 2.0 5 hole plate placed along the inferior border of the mandible securing it with 2 screws on either side of the fracture line.  Given the absence of teeth in the posterior, inferior nature of this plate I used bicortical screws.  I then placed a second 2.0 4 hole titanium plate along Champy's line bending the plate as necessary to fully adhere to the reduced fracture.  This provided excellent stabilization of the fracture site.  Given the patient had no upper teeth, there was no way to ascertain bite or the presence of malocclusion.  This point copious amounts of sterile saline were used to irrigate the wound.  The periosteum was closed over the plate.  A second layer of absorbable sutures were used to reattach the masseter muscle around the plate.  The mucosa was then closed using a running locking 3-0 Vicryl suture.  The patient's TMJs were in place and he had full passive motion of his mandible at the end of the case.  Patient's care was turned back to anesthesia after emptying the stomach using an NG tube and he was awakened and transported to the PACU in stable condition.  Estimated Blood Loss:  less than 50 mL         Drains: none  Blood Given: none          Specimens: none         Implants: 2 titanium plates were placed as described above.        Complications:  * No complications entered in OR log *         Disposition: PACU - hemodynamically stable.         Condition: stable

## 2019-12-31 NOTE — Anesthesia Procedure Notes (Signed)
Procedure Name: Intubation Date/Time: 12/31/2019 7:45 AM Performed by: Audie Pinto, CRNA Pre-anesthesia Checklist: Patient identified, Emergency Drugs available, Suction available and Patient being monitored Patient Re-evaluated:Patient Re-evaluated prior to induction Oxygen Delivery Method: Circle system utilized Preoxygenation: Pre-oxygenation with 100% oxygen Induction Type: IV induction Ventilation: Mask ventilation without difficulty Laryngoscope Size: Glidescope and 3 Grade View: Grade II Tube type: Oral Nasal Tubes: Nasal Rae, Magill forceps- large, utilized and Right Tube size: 7.5 mm Number of attempts: 1 Airway Equipment and Method: Stylet and Oral airway Placement Confirmation: ETT inserted through vocal cords under direct vision,  positive ETCO2 and breath sounds checked- equal and bilateral Secured at: 29 cm Tube secured with: Tape Dental Injury: Teeth and Oropharynx as per pre-operative assessment  Comments: Elective glidescope, nasal ett, mandible fracture/poor dentition
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
## 2019-12-31 NOTE — Anesthesia Postprocedure Evaluation (Signed)
Anesthesia Post Note  Patient: Derrick Cooper  Procedure(s) Performed: OPEN REDUCTION INTERNAL FIXATION (ORIF) MANDIBULAR FRACTURE , NASAL  MAXILLO MANDIBULAR FIXATION (Left Mouth)     Patient location during evaluation: PACU Anesthesia Type: General Level of consciousness: awake and alert and oriented Pain management: pain level controlled Vital Signs Assessment: post-procedure vital signs reviewed and stable Respiratory status: spontaneous breathing, nonlabored ventilation and respiratory function stable Cardiovascular status: blood pressure returned to baseline Postop Assessment: no apparent nausea or vomiting Anesthetic complications: no    Last Vitals:  Vitals:   12/31/19 1027 12/31/19 1044  BP: (!) 153/107   Pulse:    Resp: 20   Temp: 36.4 C 36.4 C  SpO2:      Last Pain:  Vitals:   12/31/19 1044  TempSrc: Oral  PainSc:                  Kaylyn Layer

## 2019-12-31 NOTE — Plan of Care (Signed)
  Problem: Education: Goal: Knowledge of General Education information will improve Description: Including pain rating scale, medication(s)/side effects and non-pharmacologic comfort measures Outcome: Progressing   Problem: Health Behavior/Discharge Planning: Goal: Ability to manage health-related needs will improve Outcome: Progressing   Problem: Clinical Measurements: Goal: Ability to maintain clinical measurements within normal limits will improve Outcome: Progressing   Problem: Clinical Measurements: Goal: Will remain free from infection Outcome: Progressing   Problem: Clinical Measurements: Goal: Cardiovascular complication will be avoided Outcome: Progressing   Problem: Nutrition: Goal: Adequate nutrition will be maintained Outcome: Progressing   Problem: Pain Managment: Goal: General experience of comfort will improve Outcome: Progressing

## 2019-12-31 NOTE — Progress Notes (Signed)
I spoke with patient this morning about the plan to try to repair with intraoral incision but may need to convert to transcervical approach if unable to expose adequately.  Due to his polysubstance abuse, homelessness, lack of PCP, and history of psychiatric disease (psychosis) non-compliant with medications, I want to try and avoid MMF.  I will try to place a 2.0 miniplate along Champy's line superiorly.  Patient understands that he is high risk for non-union given his social and medical history.  Nothing has changed with regards to his physical exam from yesterday's note.

## 2019-12-31 NOTE — Progress Notes (Signed)
[facility]  Date of Admission:  12/26/2019              ASSESSMENT:  Mr. Russman underwent ORIF with rigid fixation of the left mandible angle today with Dr. Elijah Birk. Pain is currently adequately controlled with medication. As for his HIV blood work shows CD4 count is 339 and viral load is 1,650. Fortunately he is not at risk for opportunistic infection. Discussed his current situation and importance of taking medication as prescribed. He would benefit from social work consult and will check with [facility]. No problems following surgery taking his Biktarvy and will continue. He does have protein-calorie deficiency as a result of his alcohol consumption, decreased oral intake, and poorly controlled HIV disease.    PLAN:  1. Continue Biktarvy 2. Social work consult. 3. Will check with [facility] about resources.  4. Mandibular fracture per ENT.   Principal Problem:   Acute respiratory failure with hypoxia (HCC) Active Problems:   HIV disease (HCC)   Hyperlipidemia associated with type 2 diabetes mellitus (HCC)   Schizophrenia (HCC)   Controlled diabetes mellitus with complication, without long-term current use of insulin (HCC)   Tobacco use   Polysubstance abuse (HCC)   Homelessness   Lobar pneumonia (HCC)   Sepsis due to pneumonia (HCC)    atorvastatin  40 mg Oral Daily   bictegravir-emtricitabine-tenofovir AF  1 tablet Oral Daily   docusate sodium  100 mg Oral BID   enoxaparin (LOVENOX) injection  40 mg Subcutaneous Q24H   fluconazole  200 mg Oral Daily   folic acid  1 mg Oral Daily   insulin aspart  0-9 Units Subcutaneous TID WC   multivitamin with minerals  1 tablet Oral Daily   nicotine  14 mg Transdermal Daily   QUEtiapine  100 mg Oral QHS   sodium chloride flush  3 mL Intravenous Q12H   thiamine  100 mg Oral Daily   Or   thiamine  100 mg Intravenous Daily    SUBJECTIVE:  Afebrile overnight with no  acute events. ORIF of left mandible today. Mr. Ledwell stopped taking his medication shortly after he was last seen in the ID office secondary to drinking. Currently homeless. Not taking his psychiatric medications. Concerned because he has lost a lot of weight now down to 129 lbs.   No Known Allergies   Review of Systems: Review of Systems  Constitutional: Negative for chills, fever and weight loss.  Respiratory: Negative for cough, shortness of breath and wheezing.   Cardiovascular: Negative for chest pain and leg swelling.  Gastrointestinal: Negative for abdominal pain, constipation, diarrhea, nausea and vomiting.  Skin: Negative for rash.      OBJECTIVE: Vitals:   12/31/19 1005 12/31/19 1025 12/31/19 1027 12/31/19 1044  BP:   (!) 153/107   Pulse: 90     Resp: 16 20 20    Temp:  97.6 F (36.4 C) 97.6 F (36.4 C) 97.6 F (36.4 C)  TempSrc:  Oral Oral Oral  SpO2: 100%     Weight:      Height:       Body mass index is 24.81 kg/m.  Physical Exam Constitutional:      General: He is not in acute distress.    Appearance: He is well-developed. He is ill-appearing.  Cardiovascular:     Rate and Rhythm: Normal rate and regular rhythm.     Heart sounds: Normal heart sounds.  Pulmonary:  Effort: Pulmonary effort is normal.     Breath sounds: Normal breath sounds.  Skin:    General: Skin is warm and dry.  Neurological:     Mental Status: He is alert and oriented to person, place, and time.  Psychiatric:        Behavior: Behavior normal.        Thought Content: Thought content normal.        Judgment: Judgment normal.     Lab Results Lab Results  Component Value Date   WBC 4.1 12/31/2019   HGB 12.5 (L) 12/31/2019   HCT 37.2 (L) 12/31/2019   MCV 91.6 12/31/2019   PLT 154 12/31/2019    Lab Results  Component Value Date   CREATININE 0.78 12/31/2019   BUN 10 12/31/2019   NA 134 (L) 12/31/2019   K 3.9 12/31/2019   CL 100 12/31/2019   CO2 28 12/31/2019    Lab  Results  Component Value Date   ALT 51 (H) 12/30/2019   AST 52 (H) 12/30/2019   ALKPHOS 62 12/30/2019   BILITOT 0.6 12/30/2019     Microbiology: Recent Results (from the past 240 hour(s))  SARS Coronavirus 2 by RT PCR (hospital order, performed in [place] hospital lab) Nasopharyngeal Nasopharyngeal Swab     Status: None   Collection Time: 12/27/19  6:46 AM   Specimen: Nasopharyngeal Swab  Result Value Ref Range Status   SARS Coronavirus 2 NEGATIVE NEGATIVE Final    Comment: (NOTE) SARS-CoV-2 target nucleic acids are NOT DETECTED. The SARS-CoV-2 RNA is generally detectable in upper and lower respiratory specimens during the acute phase of infection. The lowest concentration of SARS-CoV-2 viral copies this assay can detect is 250 copies / mL. A negative result does not preclude SARS-CoV-2 infection and should not be used as the sole basis for treatment or other patient management decisions.  A negative result may occur with improper specimen collection / handling, submission of specimen other than nasopharyngeal swab, presence of viral mutation(s) within the areas targeted by this assay, and inadequate number of viral copies (<250 copies / mL). A negative result must be combined with clinical observations, patient history, and epidemiological information. Fact Sheet for Patients:   StrictlyIdeas.no Fact Sheet for Healthcare Providers: BankingDealers.co.za This test is not yet approved or cleared  by the [place] FDA and has been authorized for detection and/or diagnosis of SARS-CoV-2 by FDA under an Emergency Use Authorization (EUA).  This EUA will remain in effect (meaning this test can be used) for the duration of the COVID-19 declaration under Section 564(b)(1) of the Act, 21 U.S.C. section 360bbb-3(b)(1), unless the authorization is terminated or revoked sooner. Performed at [facility], [place] [address],  [place], [place] [address]   Blood culture (routine x 2)     Status: None (Preliminary result)   Collection Time: 12/27/19  6:46 AM   Specimen: BLOOD LEFT HAND  Result Value Ref Range Status   Specimen Description BLOOD LEFT HAND  Final   Special Requests   Final    BOTTLES DRAWN AEROBIC AND ANAEROBIC Blood Culture results may not be optimal due to an inadequate volume of blood received in culture bottles   Culture   Final    NO GROWTH 4 DAYS Performed at [facility], [place] [address] [place], [place] [address]    Report Status PENDING  Incomplete  Blood culture (routine x 2)     Status: None (Preliminary result)   Collection  Time: 12/27/19  6:51 AM   Specimen: BLOOD RIGHT HAND  Result Value Ref Range Status   Specimen Description BLOOD RIGHT HAND  Final   Special Requests   Final    BOTTLES DRAWN AEROBIC AND ANAEROBIC Blood Culture adequate volume   Culture   Final    NO GROWTH 4 DAYS Performed at [facility], [address], [place], [place] [address]    Report Status PENDING  Incomplete  Culture, Urine     Status: Abnormal   Collection Time: 12/27/19  9:32 AM   Specimen: Urine, Random  Result Value Ref Range Status   Specimen Description URINE, RANDOM  Final   Special Requests NONE  Final   Culture (A)  Final    >=100,000 COLONIES/mL KLEBSIELLA PNEUMONIAE Two isolates with different morphologies were identified as the same organism.The most resistant organism was reported. Performed at [facility], [address] [place], [place] [address]    Report Status 12/29/2019 FINAL  Final   Organism ID, Bacteria KLEBSIELLA PNEUMONIAE (A)  Final      Susceptibility   Klebsiella pneumoniae - MIC*    AMPICILLIN >=32 RESISTANT Resistant     CEFAZOLIN <=4 SENSITIVE Sensitive     CEFTRIAXONE <=1 SENSITIVE Sensitive     CIPROFLOXACIN 2 INTERMEDIATE Intermediate     GENTAMICIN <=1 SENSITIVE Sensitive     IMIPENEM <=0.25 SENSITIVE Sensitive     NITROFURANTOIN 128  RESISTANT Resistant     TRIMETH/SULFA <=20 SENSITIVE Sensitive     AMPICILLIN/SULBACTAM 4 SENSITIVE Sensitive     PIP/TAZO <=4 SENSITIVE Sensitive     * >=100,000 COLONIES/mL KLEBSIELLA PNEUMONIAE  Surgical pcr screen     Status: None   Collection Time: 12/31/19  1:05 AM   Specimen: Nasal Mucosa; Nasal Swab  Result Value Ref Range Status   MRSA, PCR NEGATIVE NEGATIVE Final   Staphylococcus aureus NEGATIVE NEGATIVE Final    Comment: (NOTE) The Xpert SA Assay (FDA approved for NASAL specimens in patients 37 years of age and older), is one component of a comprehensive surveillance program. It is not intended to diagnose infection nor to guide or monitor treatment. Performed at [facility], [address] [place], [place] [address]      Marcos Eke, NP [facility]  12/31/2019  3:10 PM
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
## 2019-12-31 NOTE — Progress Notes (Signed)
POD #0 S/P ORIF mandible  Doing well. Pain controlled.  Cheek sensation and facial movement normal. Wound intact  Will continue with ABXs x 7 days (Unasyn) and start full liquid diet only beginning tomorrow.

## 2019-12-31 NOTE — Transfer of Care (Signed)
Immediate Anesthesia Transfer of Care Note  Patient: Derrick Cooper  Procedure(s) Performed: OPEN REDUCTION INTERNAL FIXATION (ORIF) MANDIBULAR FRACTURE , NASAL  MAXILLO MANDIBULAR FIXATION (Left Mouth)  Patient Location: PACU  Anesthesia Type:General  Level of Consciousness: drowsy  Airway & Oxygen Therapy: Patient Spontanous Breathing and Patient connected to face mask oxygen  Post-op Assessment: Report given to RN and Post -op Vital signs reviewed and stable  Post vital signs: Reviewed  Last Vitals:  Vitals Value Taken Time  BP 116/76 12/31/19 0937  Temp    Pulse 95 12/31/19 0943  Resp 20 12/31/19 0943  SpO2 95 % 12/31/19 0943  Vitals shown include unvalidated device data.  Last Pain:  Vitals:   12/31/19 0940  TempSrc:   PainSc: (P) 0-No pain      Patients Stated Pain Goal: 0 (12/29/19 1619)  Complications: No apparent anesthesia complications

## 2020-01-01 ENCOUNTER — Encounter: Payer: Self-pay | Admitting: *Deleted

## 2020-01-01 ENCOUNTER — Inpatient Hospital Stay (HOSPITAL_COMMUNITY): Payer: Medicaid Other

## 2020-01-01 DIAGNOSIS — J181 Lobar pneumonia, unspecified organism: Secondary | ICD-10-CM

## 2020-01-01 DIAGNOSIS — B20 Human immunodeficiency virus [HIV] disease: Secondary | ICD-10-CM

## 2020-01-01 DIAGNOSIS — E118 Type 2 diabetes mellitus with unspecified complications: Secondary | ICD-10-CM

## 2020-01-01 DIAGNOSIS — T1490XA Injury, unspecified, initial encounter: Secondary | ICD-10-CM

## 2020-01-01 DIAGNOSIS — J189 Pneumonia, unspecified organism: Secondary | ICD-10-CM

## 2020-01-01 LAB — CULTURE, BLOOD (ROUTINE X 2)
Culture: NO GROWTH
Culture: NO GROWTH
Special Requests: ADEQUATE

## 2020-01-01 LAB — BASIC METABOLIC PANEL
Anion gap: 9 (ref 5–15)
BUN: 9 mg/dL (ref 6–20)
CO2: 25 mmol/L (ref 22–32)
Calcium: 9 mg/dL (ref 8.9–10.3)
Chloride: 100 mmol/L (ref 98–111)
Creatinine, Ser: 0.73 mg/dL (ref 0.61–1.24)
GFR calc Af Amer: >60 mL/min (ref >60)
GFR calc non Af Amer: >60 mL/min (ref >60)
Glucose, Bld: 122 mg/dL — ABNORMAL HIGH (ref 70–99)
Potassium: 3.9 mmol/L (ref 3.5–5.1)
Sodium: 134 mmol/L — ABNORMAL LOW (ref 135–145)

## 2020-01-01 LAB — GLUCOSE, CAPILLARY
Glucose-Capillary: 132 mg/dL — ABNORMAL HIGH (ref 70–99)
Glucose-Capillary: 169 mg/dL — ABNORMAL HIGH (ref 70–99)
Glucose-Capillary: 105 mg/dL — ABNORMAL HIGH (ref 70–99)
Glucose-Capillary: 169 mg/dL — ABNORMAL HIGH (ref 70–99)

## 2020-01-01 LAB — HIV-1 RNA QUANT-NO REFLEX-BLD
HIV 1 RNA Quant: 160 copies/mL
LOG10 HIV-1 RNA: 2.204 log10copy/mL

## 2020-01-01 MED ORDER — BICTEGRAVIR-EMTRICITAB-TENOFOV 50-200-25 MG PO TABS: 1.0000 | ORAL_TABLET | Freq: Every day | ORAL | 0 refills | Status: DC

## 2020-01-01 MED FILL — BIKTARVY 50-200-25 MG TABS: 50-200-25 | 30 days supply | Qty: 30 | Fill #0

## 2020-01-01 NOTE — Plan of Care (Signed)
°  Problem: Education: °Goal: Knowledge of General Education information will improve °Description: Including pain rating scale, medication(s)/side effects and non-pharmacologic comfort measures °Outcome: Progressing °  °Problem: Health Behavior/Discharge Planning: °Goal: Ability to manage health-related needs will improve °Outcome: Progressing °  °Problem: Clinical Measurements: °Goal: Ability to maintain clinical measurements within normal limits will improve °Outcome: Progressing °  °Problem: Coping: °Goal: Level of anxiety will decrease °Outcome: Progressing °  °Problem: Pain Managment: °Goal: General experience of comfort will improve °Outcome: Progressing °  °Problem: Safety: °Goal: Ability to remain free from injury will improve °Outcome: Progressing °  °

## 2020-01-01 NOTE — Progress Notes (Signed)
  Speech Language Pathology Treatment: Dysphagia  Patient Details Name: Derrick Cooper MRN: [id number] DOB: [birth year] Today's Date: 01/01/2020 Time: 0383-3383 SLP Time Calculation (min) (ACUTE ONLY): 12 min  Assessment / Plan / Recommendation Clinical Impression  Pt was seen for dysphagia treatment and was cooperative throughout the session. Pt and nursing reported that the pt has been tolerating the current diet without overt s/sx of aspiration. Pt tolerated puree solids and thin liquids via straw using consecutive swallows without symptoms of oropharyngeal dysphagia. Pt has previously demonstrated tolerance of more advanced solids and reported that he has been better able to masticate without pain. A mechanical soft diet has been recommended today by Dr. Marcelline Deist and may be advanced further per MD's recommendations and pt's preference. Further skilled SLP services are not clinically indicated at this time.    HPI HPI: 59 y.o.malewith medical history significant ofschizophrenia; HIV; DM; and polysubstance abuse, ETOH abuse presened to ED with ETOH intoxication. CXR: Unchanged appearance of the chest since yesterday. Patchy and streaky right lung base opacity with volume loss dishes for pneumonia and atelectasis. s/p ORIF mandible and ENT recommended diet advancement to mechanical soft.       SLP Plan  All goals met;Discharge SLP treatment due to (comment)       Recommendations  Diet recommendations: Dysphagia 3 (mechanical soft);Thin liquid(Further advancement per MD rx & pt preference) Liquids provided via: Cup;Straw Medication Administration: Whole meds with liquid Supervision: Patient able to self feed Compensations: Slow rate;Small sips/bites Postural Changes and/or Swallow Maneuvers: Seated upright 90 degrees                Oral Care Recommendations: Oral care BID Follow up Recommendations: None SLP Visit Diagnosis: Dysphagia, oral phase (R13.11) Plan: All goals  met;Discharge SLP treatment due to (comment)       Aristotle Lieb I. Hardin Negus, [place], [place] Office number [phone] Pager [place] 01/01/2020, 11:15 AM

## 2020-01-01 NOTE — Progress Notes (Signed)
   Subjective   No new issues overnight. Jaw working well. No facial numbness or fever.  Pain controlled.    Objective   Examination:  General exam: Appears calm and comfortable  Respiratory system: Clear to auscultation. Respiratory effort normal. HEENT: [place]/AT, PERRLA, no thrush, no stridor Central nervous system: Alert and oriented. No focal neurological deficits. Psychiatry: Judgement and insight appear normal. Mood & affect appropriate.  Wound intact with full range of motion of mandible/no infection noted  VITALS:  height is 6\' 2"  (1.88 m) and weight is 61.2 kg. His oral temperature is 97.7 F (36.5 C). His blood pressure is 125/112 (abnormal) and his pulse is 92. His respiration is 22 (abnormal) and oxygen saturation is 96%.    Assessment/Plan:   POD #1 s/p ORIF left mandibular angle  Advance to mechanical soft diet today with Glucerna shakes Will obtain post-reduction mandible film today Continue antibiotics   

## 2020-01-01 NOTE — Progress Notes (Signed)
[facility] PROGRESS NOTE    Progress Note  Derrick Cooper  JHE:[id number] DOB: 09-25-1960 DOA: 12/26/2019 PCP: Bryon Lions, PA-C     Brief Narrative:   Derrick Cooper is an 59 y.o. male homeless this past medical history of schizophrenia HIV diabetes mellitus polysubstance abuse including alcohol was found [place] in [place] intoxicated and hallucinating was brought into the ED was found to be in acute respiratory failure with hypoxia due to a right lower lobe pneumonia.  Assessment/Plan:   Acute respiratory failure with hypoxia due to right lower lobe pneumonia/sepsis: Possible aspiration he was started empirically on IV Rocephin and azithromycin he is clinically improved, has been weaned off oxygen. Swallowing evaluation was done that showed a risk of aspiration dysphagia 2 diet was recommended.  Mandible fracture: ENT has been consulted, his situation is complicated due to his homelessness, HIV polysubstance abuse and psych history.  Ventrally underwent ORIF with rigid fixation of the right mandible by Dr. Elijah Birk, will continue dysphagia 2 diet may require prolonged hospital stay for the diet advancement per ENT. Diet per surgery, surgery also did dictate when can patient be discharged.  HIV disease/oral thrush: Low CD4 count of 300 continue Biktarvy He will start her counseled to lose oral thrush.  Schizophrenia: Continue Seroquel.  Diabetes mellitus type 2 controlled: A1c of 7.1 Glucophage was discontinued admission continue sliding scale and long-acting insulin.  Polysubstance abuse: Including cocaine alcohol and cannabis. Continue thiamine no signs of withdrawal.  Thrombocytopenia: Likely due to alcohol abuse.  Disposition: TOC consulted.   DVT prophylaxis: lovenox Family Communication:none Status is: Inpatient  Remains inpatient appropriate because:IV treatments appropriate due to intensity of illness or inability to take PO   Dispo: The  patient is from: Home              Anticipated d/c is to: SNF              Anticipated d/c date is: 2 days              Patient currently is not medically stable to d/c.         Code Status:     Code Status Orders  (From admission, onward)         Start     Ordered   12/27/19 0929  Full code  Continuous     12/27/19 0932        Code Status History    Date Active Date Inactive Code Status Order ID Comments User Context   12/10/2019 0800 12/10/2019 1953 Full Code [id number]  Cathren Laine, MD ED   10/24/2019 0333 10/29/2019 0038 Full Code [id number]  Wandra Arthurs, NP Inpatient   10/23/2019 1831 10/24/2019 0029 Full Code [id number]  Arthor Captain, PA-C ED   10/02/2019 2256 10/04/2019 1747 Full Code [id number]  Denzil Magnuson, NP Inpatient   08/09/2018 0455 08/11/2018 1427 Full Code [id number]  Kerry Hough, PA-C Inpatient   08/09/2018 0148 08/09/2018 0205 Full Code [id number]  Kerry Hough, PA-C ED   07/11/2018 1649 07/17/2018 1851 Full Code [id number]  Money, Gerlene Burdock, FNP Inpatient   07/09/2018 1400 07/11/2018 1610 Full Code [id number]  Samuel Jester, DO ED   Advance Care Planning Activity        IV Access:    Peripheral IV   Procedures and diagnostic studies:   No results found.   Medical Consultants:    None.  Anti-Infectives:  IV Unasyn  Subjective:    Derrick Cooper  L Rosen relates his pain is controlled.  Objective:    Vitals:   01/01/20 0323 01/01/20 0510 01/01/20 0750 01/01/20 1133  BP:   (!) 125/112   Pulse:      Resp:      Temp: 97.8 F (36.6 C)  97.7 F (36.5 C) 98 F (36.7 C)  TempSrc: Oral  Oral Oral  SpO2:      Weight:  61.2 kg    Height:       SpO2: 96 % O2 Flow Rate (L/min): 2 L/min   Intake/Output Summary (Last 24 hours) at 01/01/2020 1317 Last data filed at 01/01/2020 1255 Gross per 24 hour  Intake 2360 ml  Output 2160 ml  Net 200 ml   Filed Weights   12/30/19 0003 12/31/19 0601 01/01/20 0510  Weight: 61.1 kg 87.6 kg 61.2 kg      Exam: General exam: In no acute distress. Respiratory system: Good air movement and clear to auscultation. Cardiovascular system: S1 & S2 heard, RRR. No JVD. Gastrointestinal system: Abdomen is nondistended, soft and nontender.  Skin: No rashes, lesions or ulcers Psychiatry: Judgement and insight appear normal. Mood & affect appropriate.    Data Reviewed:    Labs: Basic Metabolic Panel: Recent Labs  Lab 12/26/19 1915 12/27/19 1015 12/28/19 0981 12/28/19 1914 12/29/19 7829 12/29/19 5621 12/30/19 0417 12/30/19 0417 12/31/19 0054 01/01/20 0714  NA   < >  --  133*  --  134*  --  134*  --  134* 134*  K   < >  --  3.1*   < > 3.3*   < > 3.6   < > 3.9 3.9  CL   < >  --  96*  --  99  --  101  --  100 100  CO2   < >  --  28  --  27  --  24  --  28 25  GLUCOSE   < >  --  110*  --  84  --  122*  --  126* 122*  BUN   < >  --  8  --  9  --  10  --  10 9  CREATININE   < >  --  0.60*  --  0.65  --  0.70  --  0.78 0.73  CALCIUM   < >  --  8.5*  --  8.9  --  9.3  --  9.1 9.0  MG  --  1.8  --   --   --   --  1.6*  --  1.8  --   PHOS  --  2.5  --   --   --   --   --   --   --   --    < > = values in this interval not displayed.   GFR Estimated Creatinine Clearance: 88.2 mL/min (by C-G formula based on SCr of 0.73 mg/dL). Liver Function Tests: Recent Labs  Lab 12/26/19 1915 12/28/19 0043 12/30/19 0417  AST 98* 57* 52*  ALT 81* 54* 51*  ALKPHOS 74 57 62  BILITOT 0.5 1.2 0.6  PROT 8.7* 8.0 7.9  ALBUMIN 3.2* 2.6* 2.6*   No results for input(s): LIPASE, AMYLASE in the last 168 hours. No results for input(s): AMMONIA in the last 168 hours. Coagulation profile No results for input(s): INR, PROTIME in the last 168 hours. COVID-19 Labs  No results for input(s): DDIMER, FERRITIN, LDH, CRP in the last  72 hours.  Lab Results  Component Value Date   SARSCOV2NAA NEGATIVE 12/27/2019   SARSCOV2NAA NEGATIVE 12/10/2019   SARSCOV2NAA NEGATIVE 10/23/2019   SARSCOV2NAA Not Detected  10/11/2019    CBC: Recent Labs  Lab 12/26/19 1915 12/28/19 0043 12/29/19 0629 12/30/19 0417 12/31/19 0054  WBC 6.6 7.0 5.5 4.2 4.1  HGB 12.7* 11.6* 12.4* 12.7* 12.5*  HCT 37.8* 33.0* 36.5* 36.2* 37.2*  MCV 93.8 90.7 91.9 91.4 91.6  PLT 125* 108* 114* 125* 154   Cardiac Enzymes: No results for input(s): CKTOTAL, CKMB, CKMBINDEX, TROPONINI in the last 168 hours. BNP (last 3 results) No results for input(s): PROBNP in the last 8760 hours. CBG: Recent Labs  Lab 12/31/19 1121 12/31/19 1656 12/31/19 2111 01/01/20 0601 01/01/20 1133  GLUCAP 139* 139* 113* 132* 169*   D-Dimer: No results for input(s): DDIMER in the last 72 hours. Hgb A1c: No results for input(s): HGBA1C in the last 72 hours. Lipid Profile: No results for input(s): CHOL, HDL, LDLCALC, TRIG, CHOLHDL, LDLDIRECT in the last 72 hours. Thyroid function studies: No results for input(s): TSH, T4TOTAL, T3FREE, THYROIDAB in the last 72 hours.  Invalid input(s): FREET3 Anemia work up: No results for input(s): VITAMINB12, FOLATE, FERRITIN, TIBC, IRON, RETICCTPCT in the last 72 hours. Sepsis Labs: Recent Labs  Lab 12/26/19 1915 12/27/19 1015 12/27/19 1022 12/27/19 1359 12/28/19 0043 12/29/19 0629 12/30/19 0417 12/31/19 0054  PROCALCITON  --  <0.10  --   --   --   --   --   --   WBC   < >  --   --   --  7.0 5.5 4.2 4.1  LATICACIDVEN  --   --  1.6 1.2  --   --   --   --    < > = values in this interval not displayed.   Microbiology Recent Results (from the past 240 hour(s))  SARS Coronavirus 2 by RT PCR (hospital order, performed in [facility] hospital lab) Nasopharyngeal Nasopharyngeal Swab     Status: None   Collection Time: 12/27/19  6:46 AM   Specimen: Nasopharyngeal Swab  Result Value Ref Range Status   SARS Coronavirus 2 NEGATIVE NEGATIVE Final    Comment: (NOTE) SARS-CoV-2 target nucleic acids are NOT DETECTED. The SARS-CoV-2 RNA is generally detectable in upper and lower respiratory specimens  during the acute phase of infection. The lowest concentration of SARS-CoV-2 viral copies this assay can detect is 250 copies / mL. A negative result does not preclude SARS-CoV-2 infection and should not be used as the sole basis for treatment or other patient management decisions.  A negative result may occur with improper specimen collection / handling, submission of specimen other than nasopharyngeal swab, presence of viral mutation(s) within the areas targeted by this assay, and inadequate number of viral copies (<250 copies / mL). A negative result must be combined with clinical observations, patient history, and epidemiological information. Fact Sheet for Patients:   BoilerBrush.com.cyhttps://www.fda.gov/media/136312/download Fact Sheet for Healthcare Providers: https://pope.com/https://www.fda.gov/media/136313/download This test is not yet approved or cleared  by the [place] FDA and has been authorized for detection and/or diagnosis of SARS-CoV-2 by FDA under an Emergency Use Authorization (EUA).  This EUA will remain in effect (meaning this test can be used) for the duration of the COVID-19 declaration under Section 564(b)(1) of the Act, 21 U.S.C. section 360bbb-3(b)(1), unless the authorization is terminated or revoked sooner. Performed at [facility], [address] [place], [place] [address]   Blood  culture (routine x 2)     Status: None   Collection Time: 12/27/19  6:46 AM   Specimen: BLOOD LEFT HAND  Result Value Ref Range Status   Specimen Description BLOOD LEFT HAND  Final   Special Requests   Final    BOTTLES DRAWN AEROBIC AND ANAEROBIC Blood Culture results may not be optimal due to an inadequate volume of blood received in culture bottles   Culture   Final    NO GROWTH 5 DAYS Performed at [facility], [address], [place], [place] [address]    Report Status 01/01/2020 FINAL  Final  Blood culture (routine x 2)     Status: None   Collection Time: 12/27/19  6:51 AM    Specimen: BLOOD RIGHT HAND  Result Value Ref Range Status   Specimen Description BLOOD RIGHT HAND  Final   Special Requests   Final    BOTTLES DRAWN AEROBIC AND ANAEROBIC Blood Culture adequate volume   Culture   Final    NO GROWTH 5 DAYS Performed at [facility], [address] [employer] [place] [place], [place] [address]    Report Status 01/01/2020 FINAL  Final  Culture, Urine     Status: Abnormal   Collection Time: 12/27/19  9:32 AM   Specimen: Urine, Random  Result Value Ref Range Status   Specimen Description URINE, RANDOM  Final   Special Requests NONE  Final   Culture (A)  Final    >=100,000 COLONIES/mL KLEBSIELLA PNEUMONIAE Two isolates with different morphologies were identified as the same organism.The most resistant organism was reported. Performed at [facility], [address] [place], [place] [address]    Report Status 12/29/2019 FINAL  Final   Organism ID, Bacteria KLEBSIELLA PNEUMONIAE (A)  Final      Susceptibility   Klebsiella pneumoniae - MIC*    AMPICILLIN >=32 RESISTANT Resistant     CEFAZOLIN <=4 SENSITIVE Sensitive     CEFTRIAXONE <=1 SENSITIVE Sensitive     CIPROFLOXACIN 2 INTERMEDIATE Intermediate     GENTAMICIN <=1 SENSITIVE Sensitive     IMIPENEM <=0.25 SENSITIVE Sensitive     NITROFURANTOIN 128 RESISTANT Resistant     TRIMETH/SULFA <=20 SENSITIVE Sensitive     AMPICILLIN/SULBACTAM 4 SENSITIVE Sensitive     PIP/TAZO <=4 SENSITIVE Sensitive     * >=100,000 COLONIES/mL KLEBSIELLA PNEUMONIAE  Surgical pcr screen     Status: None   Collection Time: 12/31/19  1:05 AM   Specimen: Nasal Mucosa; Nasal Swab  Result Value Ref Range Status   MRSA, PCR NEGATIVE NEGATIVE Final   Staphylococcus aureus NEGATIVE NEGATIVE Final    Comment: (NOTE) The Xpert SA Assay (FDA approved for NASAL specimens in patients 38 years of age and older), is one component of a comprehensive surveillance program. It is not intended to diagnose infection nor to guide or monitor  treatment. Performed at [facility], [address] [place], [place] [address]      Medications:   . atorvastatin  40 mg Oral Daily  . bictegravir-emtricitabine-tenofovir AF  1 tablet Oral Daily  . docusate sodium  100 mg Oral BID  . enoxaparin (LOVENOX) injection  40 mg Subcutaneous Q24H  . feeding supplement (GLUCERNA SHAKE)  237 mL Oral TID BM  . fluconazole  200 mg Oral Daily  . folic acid  1 mg Oral Daily  . insulin aspart  0-9 Units Subcutaneous TID WC  . multivitamin with minerals  1 tablet Oral  Daily  . nicotine  14 mg Transdermal Daily  . QUEtiapine  100 mg Oral QHS  . sodium chloride flush  3 mL Intravenous Q12H  . thiamine  100 mg Oral Daily   Or  . thiamine  100 mg Intravenous Daily   Continuous Infusions: . sodium chloride Stopped (12/30/19 1403)  . ampicillin-sulbactam (UNASYN) IV 1.5 g (01/01/20 1200)  . azithromycin (ZITHROMAX) 500 MG IVPB (Vial-Mate [employer]) 500 mg (01/01/20 0800)      LOS: 5 days   Charlynne Cousins  [facility]  01/01/2020, 1:17 PM

## 2020-01-01 NOTE — Progress Notes (Signed)
[facility] for Infectious Disease  Date of Admission:  12/26/2019             ASSESSMENT:  Mr. Sposito is POD #1 from ORIF with rigid fixation of the left mandible with pain being adequately controlled. He is taking his Biktarvy daily without problems. Ready to ensure he gets back into care and stop drinking. Continue current dose of Biktarvy. Will arrange 30 day supply of medication to the beside prior to discharge and ID follow up.   PLAN:  1. Continue current dose of Biktarvy.  2. Arrange TOC pharmacy to deliver 30 day supply of medication at discharge. 3. Mandibular fracture per ENT.   Principal Problem:   Acute respiratory failure with hypoxia (HCC) Active Problems:   HIV disease ([place])   Hyperlipidemia associated with type 2 diabetes mellitus ([place])   Schizophrenia ([place])   Controlled diabetes mellitus with complication, without long-term current use of insulin ([place])   Tobacco use   Polysubstance abuse ([place])   Homelessness   Lobar pneumonia ([place])   Sepsis due to pneumonia ([place])   . atorvastatin  40 mg Oral Daily  . bictegravir-emtricitabine-tenofovir AF  1 tablet Oral Daily  . docusate sodium  100 mg Oral BID  . enoxaparin (LOVENOX) injection  40 mg Subcutaneous Q24H  . feeding supplement (GLUCERNA SHAKE)  237 mL Oral TID BM  . fluconazole  200 mg Oral Daily  . folic acid  1 mg Oral Daily  . insulin aspart  0-9 Units Subcutaneous TID WC  . multivitamin with minerals  1 tablet Oral Daily  . nicotine  14 mg Transdermal Daily  . QUEtiapine  100 mg Oral QHS  . sodium chloride flush  3 mL Intravenous Q12H  . thiamine  100 mg Oral Daily   Or  . thiamine  100 mg Intravenous Daily    SUBJECTIVE:  Afebrile overnight with no acute events. He is hungry and would like chicken soup.   No Known Allergies   Review of Systems: Review of Systems  Constitutional: Negative for chills, fever and weight loss.  Respiratory: Negative for cough, shortness of breath and  wheezing.   Cardiovascular: Negative for chest pain and leg swelling.  Gastrointestinal: Negative for abdominal pain, constipation, diarrhea, nausea and vomiting.  Skin: Negative for rash.      OBJECTIVE: Vitals:   01/01/20 0323 01/01/20 0510 01/01/20 0750 01/01/20 1133  BP:   (!) 125/112   Pulse:      Resp:      Temp: 97.8 F (36.6 C)  97.7 F (36.5 C) 98 F (36.7 C)  TempSrc: Oral  Oral Oral  SpO2:      Weight:  61.2 kg    Height:       Body mass index is 17.32 kg/m.  Physical Exam Constitutional:      General: He is not in acute distress.    Appearance: He is well-developed.     Comments: Lying in bed with head of bed elevated; pleasant.   Cardiovascular:     Rate and Rhythm: Normal rate and regular rhythm.     Heart sounds: Normal heart sounds.  Pulmonary:     Effort: Pulmonary effort is normal.     Breath sounds: Normal breath sounds.  Skin:    General: Skin is warm and dry.  Neurological:     Mental Status: He is alert and oriented to person, place, and time.  Psychiatric:        Behavior:  Behavior normal.        Thought Content: Thought content normal.        Judgment: Judgment normal.     Lab Results Lab Results  Component Value Date   WBC 4.1 12/31/2019   HGB 12.5 (L) 12/31/2019   HCT 37.2 (L) 12/31/2019   MCV 91.6 12/31/2019   PLT 154 12/31/2019    Lab Results  Component Value Date   CREATININE 0.73 01/01/2020   BUN 9 01/01/2020   NA 134 (L) 01/01/2020   K 3.9 01/01/2020   CL 100 01/01/2020   CO2 25 01/01/2020    Lab Results  Component Value Date   ALT 51 (H) 12/30/2019   AST 52 (H) 12/30/2019   ALKPHOS 62 12/30/2019   BILITOT 0.6 12/30/2019     Microbiology: Recent Results (from the past 240 hour(s))  SARS Coronavirus 2 by RT PCR (hospital order, performed in [facility] hospital lab) Nasopharyngeal Nasopharyngeal Swab     Status: None   Collection Time: 12/27/19  6:46 AM   Specimen: Nasopharyngeal Swab  Result Value Ref Range  Status   SARS Coronavirus 2 NEGATIVE NEGATIVE Final    Comment: (NOTE) SARS-CoV-2 target nucleic acids are NOT DETECTED. The SARS-CoV-2 RNA is generally detectable in upper and lower respiratory specimens during the acute phase of infection. The lowest concentration of SARS-CoV-2 viral copies this assay can detect is 250 copies / mL. A negative result does not preclude SARS-CoV-2 infection and should not be used as the sole basis for treatment or other patient management decisions.  A negative result may occur with improper specimen collection / handling, submission of specimen other than nasopharyngeal swab, presence of viral mutation(s) within the areas targeted by this assay, and inadequate number of viral copies (<250 copies / mL). A negative result must be combined with clinical observations, patient history, and epidemiological information. Fact Sheet for Patients:   BoilerBrush.com.cy Fact Sheet for Healthcare Providers: https://pope.com/ This test is not yet approved or cleared  by the [place] FDA and has been authorized for detection and/or diagnosis of SARS-CoV-2 by FDA under an Emergency Use Authorization (EUA).  This EUA will remain in effect (meaning this test can be used) for the duration of the COVID-19 declaration under Section 564(b)(1) of the Act, 21 U.S.C. section 360bbb-3(b)(1), unless the authorization is terminated or revoked sooner. Performed at [facility], [address], [place], [place] [address]   Blood culture (routine x 2)     Status: None   Collection Time: 12/27/19  6:46 AM   Specimen: BLOOD LEFT HAND  Result Value Ref Range Status   Specimen Description BLOOD LEFT HAND  Final   Special Requests   Final    BOTTLES DRAWN AEROBIC AND ANAEROBIC Blood Culture results may not be optimal due to an inadequate volume of blood received in culture bottles   Culture   Final    NO GROWTH 5  DAYS Performed at [facility], [address] [place], [place] [address]    Report Status 01/01/2020 FINAL  Final  Blood culture (routine x 2)     Status: None   Collection Time: 12/27/19  6:51 AM   Specimen: BLOOD RIGHT HAND  Result Value Ref Range Status   Specimen Description BLOOD RIGHT HAND  Final   Special Requests   Final    BOTTLES DRAWN AEROBIC AND ANAEROBIC Blood Culture adequate volume   Culture   Final    NO GROWTH  5 DAYS Performed at [facility], [address] [place], [place] [address]    Report Status 01/01/2020 FINAL  Final  Culture, Urine     Status: Abnormal   Collection Time: 12/27/19  9:32 AM   Specimen: Urine, Random  Result Value Ref Range Status   Specimen Description URINE, RANDOM  Final   Special Requests NONE  Final   Culture (A)  Final    >=100,000 COLONIES/mL KLEBSIELLA PNEUMONIAE Two isolates with different morphologies were identified as the same organism.The most resistant organism was reported. Performed at [facility], [address], [place], [place] [address]    Report Status 12/29/2019 FINAL  Final   Organism ID, Bacteria KLEBSIELLA PNEUMONIAE (A)  Final      Susceptibility   Klebsiella pneumoniae - MIC*    AMPICILLIN >=32 RESISTANT Resistant     CEFAZOLIN <=4 SENSITIVE Sensitive     CEFTRIAXONE <=1 SENSITIVE Sensitive     CIPROFLOXACIN 2 INTERMEDIATE Intermediate     GENTAMICIN <=1 SENSITIVE Sensitive     IMIPENEM <=0.25 SENSITIVE Sensitive     NITROFURANTOIN 128 RESISTANT Resistant     TRIMETH/SULFA <=20 SENSITIVE Sensitive     AMPICILLIN/SULBACTAM 4 SENSITIVE Sensitive     PIP/TAZO <=4 SENSITIVE Sensitive     * >=100,000 COLONIES/mL KLEBSIELLA PNEUMONIAE  Surgical pcr screen     Status: None   Collection Time: 12/31/19  1:05 AM   Specimen: Nasal Mucosa; Nasal Swab  Result Value Ref Range Status   MRSA, PCR NEGATIVE NEGATIVE Final   Staphylococcus aureus NEGATIVE NEGATIVE Final    Comment: (NOTE) The Xpert  SA Assay (FDA approved for NASAL specimens in patients 62 years of age and older), is one component of a comprehensive surveillance program. It is not intended to diagnose infection nor to guide or monitor treatment. Performed at [facility], [address] [place], [place] [address]      Marcos Eke, NP [facility]  01/01/2020  12:48 PM
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
## 2020-01-02 DIAGNOSIS — S02609A Fracture of mandible, unspecified, initial encounter for closed fracture: Secondary | ICD-10-CM

## 2020-01-02 DIAGNOSIS — F1010 Alcohol abuse, uncomplicated: Secondary | ICD-10-CM

## 2020-01-02 LAB — GLUCOSE, CAPILLARY
Glucose-Capillary: 127 mg/dL — ABNORMAL HIGH (ref 70–99)
Glucose-Capillary: 134 mg/dL — ABNORMAL HIGH (ref 70–99)
Glucose-Capillary: 146 mg/dL — ABNORMAL HIGH (ref 70–99)
Glucose-Capillary: 140 mg/dL — ABNORMAL HIGH (ref 70–99)

## 2020-01-02 MED ORDER — POLYVINYL ALCOHOL 1.4 % OP SOLN
1.0000 [drp] | OPHTHALMIC | Status: DC | PRN
  Administered 2020-01-02: 1 [drp] via OPHTHALMIC
  Filled 2020-01-02: qty 15

## 2020-01-02 MED ORDER — SODIUM CHLORIDE 0.9 % IV SOLN: INTRAVENOUS | Status: DC

## 2020-01-02 MED ORDER — SODIUM CHLORIDE 0.9 % IV SOLN: INTRAVENOUS | Status: AC

## 2020-01-02 MED ORDER — RESOURCE THICKENUP CLEAR PO POWD
Freq: Three times a day (TID) | ORAL | Status: DC
  Filled 2020-01-02 (×2): qty 125

## 2020-01-02 MED ORDER — STARCH (THICKENING) PO POWD
Freq: Three times a day (TID) | ORAL | Status: DC
  Filled 2020-01-02: qty 227
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
## 2020-01-03 DIAGNOSIS — E1169 Type 2 diabetes mellitus with other specified complication: Secondary | ICD-10-CM

## 2020-01-03 DIAGNOSIS — E785 Hyperlipidemia, unspecified: Secondary | ICD-10-CM

## 2020-01-03 DIAGNOSIS — S02600A Fracture of unspecified part of body of mandible, initial encounter for closed fracture: Secondary | ICD-10-CM

## 2020-01-03 LAB — BASIC METABOLIC PANEL
Anion gap: 6 (ref 5–15)
BUN: 12 mg/dL (ref 6–20)
CO2: 26 mmol/L (ref 22–32)
Calcium: 9 mg/dL (ref 8.9–10.3)
Chloride: 102 mmol/L (ref 98–111)
Creatinine, Ser: 0.73 mg/dL (ref 0.61–1.24)
GFR calc Af Amer: >60 mL/min (ref >60)
GFR calc non Af Amer: >60 mL/min (ref >60)
Glucose, Bld: 142 mg/dL — ABNORMAL HIGH (ref 70–99)
Potassium: 3.9 mmol/L (ref 3.5–5.1)
Sodium: 134 mmol/L — ABNORMAL LOW (ref 135–145)

## 2020-01-03 LAB — GLUCOSE, CAPILLARY
Glucose-Capillary: 115 mg/dL — ABNORMAL HIGH (ref 70–99)
Glucose-Capillary: 121 mg/dL — ABNORMAL HIGH (ref 70–99)
Glucose-Capillary: 138 mg/dL — ABNORMAL HIGH (ref 70–99)
Glucose-Capillary: 180 mg/dL — ABNORMAL HIGH (ref 70–99)
Glucose-Capillary: 159 mg/dL — ABNORMAL HIGH (ref 70–99)

## 2020-01-03 MED ORDER — AMOXICILLIN-POT CLAVULANATE 875-125 MG PO TABS
1.0000 | ORAL_TABLET | Freq: Two times a day (BID) | ORAL | 0 refills | Status: DC
Start: 2020-01-03 — End: 2020-01-06

## 2020-01-03 MED ORDER — HYDROCODONE-ACETAMINOPHEN 5-325 MG PO TABS: 1.0000 | ORAL_TABLET | ORAL | 0 refills | Status: DC | PRN

## 2020-01-03 MED ORDER — FLUCONAZOLE 200 MG PO TABS: 200.0000 mg | ORAL_TABLET | Freq: Every day | ORAL | 0 refills | Status: AC

## 2020-01-03 NOTE — Plan of Care (Signed)
  Problem: Nutrition: Goal: Adequate nutrition will be maintained Outcome: Completed/Met   Problem: Coping: Goal: Level of anxiety will decrease Outcome: Completed/Met   Problem: Elimination: Goal: Will not experience complications related to bowel motility Outcome: Completed/Met Goal: Will not experience complications related to urinary retention Outcome: Completed/Met   Problem: Safety: Goal: Ability to remain free from injury will improve Outcome: Completed/Met   Problem: Skin Integrity: Goal: Risk for impaired skin integrity will decrease Outcome: Completed/Met   
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
## 2020-01-03 NOTE — Progress Notes (Signed)
Patient refused to add tickner to his drinks. He has been drinking thin fluid. Patient also has discharge medication in his pyxis been and taxi voucher in his discharge envelop. Do not forget please.

## 2020-01-03 NOTE — TOC Initial Note (Addendum)
Transition of Care [facility]) - Initial/Assessment Note    Patient Details  Name: Derrick Cooper MRN: [id number] Date of Birth: 30-Jul-1961  Transition of Care [facility]) CM/SW Contact:    Leone Haven, RN Phone Number: 01/03/2020, 9:56 AM  Clinical Narrative:                 Patient states he needs to get to the [facility] before 3 pm, he needs ast with transportation, NCM assisting with transportation.  TOC filled one of  his medication for him. He has printed scripts for the other meds to take to his pharmacy, he states he has a debit card at the [facility].  He has a rollator in the room.  NCM asked patient if he was going to the Shelter , he states yes , the [facility] will help him to get into shelter. NCM gave patient housing resources.   Expected Discharge Plan: Home/Self Care Barriers to Discharge: No Barriers Identified   Patient Goals and CMS Choice Patient states their goals for this hospitalization and ongoing recovery are:: to get to [facility] before 3   Choice offered to / list presented to : NA  Expected Discharge Plan and Services Expected Discharge Plan: Home/Self Care   Discharge Planning Services: CM Consult Post Acute Care Choice: NA   Expected Discharge Date: 01/03/20                 DME Agency: NA       HH Arranged: NA          Prior Living Arrangements/Services   Lives with:: Other (Comment)(shelter) Patient language and need for interpreter reviewed:: Yes Do you feel safe going back to the place where you live?: Yes      Need for Family Participation in Patient Care: No (Comment) Care giver support system in place?: No (comment)   Criminal Activity/Legal Involvement Pertinent to Current Situation/Hospitalization: No - Comment as needed  Activities of Daily Living Home Assistive Devices/Equipment: Other (Comment)(eyepatch) ADL Screening (condition at time of admission) Patient's cognitive ability adequate to safely complete daily activities?: Yes Is the patient deaf or  have difficulty hearing?: No Does the patient have difficulty seeing, even when wearing glasses/contacts?: Yes Does the patient have difficulty concentrating, remembering, or making decisions?: No Patient able to express need for assistance with ADLs?: Yes Does the patient have difficulty dressing or bathing?: No Independently performs ADLs?: Yes (appropriate for developmental age) Does the patient have difficulty walking or climbing stairs?: Yes Weakness of Legs: Both Weakness of Arms/Hands: None  Permission Sought/Granted                  Emotional Assessment Appearance:: Appears stated age Attitude/Demeanor/Rapport: Engaged Affect (typically observed): Appropriate Orientation: : Oriented to Place, Oriented to Self, Oriented to  Time, Oriented to Situation Alcohol / Substance Use: Not Applicable Psych Involvement: No (comment)  Admission diagnosis:  Alcohol abuse [F10.10] Alcoholic liver disease (HCC) [K70.9] Trauma [T14.90XA] Acute respiratory failure with hypoxia (HCC) [J96.01] Community acquired pneumonia of right lung, unspecified part of lung [J18.9] HIV infection, unspecified symptom status (HCC) [B20] Patient Active Problem List   Diagnosis Date Noted  . Alcohol abuse   . Fracture of mandible (HCC)   . Acute respiratory failure with hypoxia (HCC) 12/27/2019  . Lobar pneumonia (HCC) 12/27/2019  . Community acquired pneumonia of right lung 12/27/2019  . Polysubstance abuse (HCC) 12/10/2019  . Homelessness 12/10/2019  . Substance induced mood disorder (HCC) 12/10/2019  . MDD (major  depressive disorder), recurrent episode, severe ([place]) 08/09/2018  . Schizoaffective disorder, bipolar type ([place])   . Health care maintenance 06/26/2018  . Tobacco use 06/26/2018  . Furuncle of right axilla 11/13/2017  . Hyperlipidemia associated with type 2 diabetes mellitus ([place]) 10/25/2017  . 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) 10/25/2017  . HIV infection ([place]) 09/27/2017  . Chronic hepatitis C without hepatic coma ([place]) 09/27/2017  . Acute non-recurrent maxillary sinusitis 09/27/2017  . Schizophrenia ([place]) 09/07/2017  . Controlled diabetes mellitus with complication, without long-term current use of insulin ([place]) 09/07/2017  . Urinary incontinence 09/07/2017   PCP:  Loyola Mast, PA-C Pharmacy:   [facility] [address], [place] [address] Phone: [phone] Fax: [phone]  [facility] [place] [place] [address] [place] [address] Phone: [phone] Fax: [phone]     Social Determinants of Health (SDOH) Interventions    Readmission Risk Interventions Readmission Risk Prevention Plan 01/03/2020  Transportation Screening Complete  Medication Review (RN Care Manager) Complete  PCP or Specialist appointment within 3-5 days of discharge Complete  HRI or [place] Complete  SW Recovery Care/Counseling Consult Complete  [place] Not Applicable  Some recent data might be hidden

## 2020-01-03 NOTE — Plan of Care (Signed)
  Problem: Education: Goal: Knowledge of General Education information will improve Description: Including pain rating scale, medication(s)/side effects and non-pharmacologic comfort measures Outcome: Completed/Met   Problem: Health Behavior/Discharge Planning: Goal: Ability to manage health-related needs will improve Outcome: Completed/Met   Problem: Clinical Measurements: Goal: Ability to maintain clinical measurements within normal limits will improve Outcome: Completed/Met Goal: Will remain free from infection Outcome: Completed/Met Goal: Diagnostic test results will improve Outcome: Completed/Met Goal: Respiratory complications will improve Outcome: Completed/Met Goal: Cardiovascular complication will be avoided Outcome: Completed/Met   Problem: Activity: Goal: Risk for activity intolerance will decrease Outcome: Completed/Met   Problem: Pain Managment: Goal: General experience of comfort will improve Outcome: Completed/Met
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
## 2020-01-04 LAB — BASIC METABOLIC PANEL
Anion gap: 7 (ref 5–15)
BUN: 12 mg/dL (ref 6–20)
CO2: 27 mmol/L (ref 22–32)
Calcium: 9.4 mg/dL (ref 8.9–10.3)
Chloride: 101 mmol/L (ref 98–111)
Creatinine, Ser: 0.76 mg/dL (ref 0.61–1.24)
GFR calc Af Amer: >60 mL/min (ref >60)
GFR calc non Af Amer: >60 mL/min (ref >60)
Glucose, Bld: 120 mg/dL — ABNORMAL HIGH (ref 70–99)
Potassium: 4 mmol/L (ref 3.5–5.1)
Sodium: 135 mmol/L (ref 135–145)

## 2020-01-04 LAB — GLUCOSE, CAPILLARY
Glucose-Capillary: 117 mg/dL — ABNORMAL HIGH (ref 70–99)
Glucose-Capillary: 154 mg/dL — ABNORMAL HIGH (ref 70–99)
Glucose-Capillary: 158 mg/dL — ABNORMAL HIGH (ref 70–99)
Glucose-Capillary: 185 mg/dL — ABNORMAL HIGH (ref 70–99)

## 2020-01-04 NOTE — Plan of Care (Signed)
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
## 2020-01-05 LAB — GLUCOSE, CAPILLARY
Glucose-Capillary: 131 mg/dL — ABNORMAL HIGH (ref 70–99)
Glucose-Capillary: 105 mg/dL — ABNORMAL HIGH (ref 70–99)
Glucose-Capillary: 126 mg/dL — ABNORMAL HIGH (ref 70–99)
Glucose-Capillary: 228 mg/dL — ABNORMAL HIGH (ref 70–99)

## 2020-01-05 LAB — BASIC METABOLIC PANEL
Anion gap: 8 (ref 5–15)
BUN: 11 mg/dL (ref 6–20)
CO2: 25 mmol/L (ref 22–32)
Calcium: 9.5 mg/dL (ref 8.9–10.3)
Chloride: 102 mmol/L (ref 98–111)
Creatinine, Ser: 0.71 mg/dL (ref 0.61–1.24)
GFR calc Af Amer: >60 mL/min (ref >60)
GFR calc non Af Amer: >60 mL/min (ref >60)
Glucose, Bld: 136 mg/dL — ABNORMAL HIGH (ref 70–99)
Potassium: 4.1 mmol/L (ref 3.5–5.1)
Sodium: 135 mmol/L (ref 135–145)
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
## 2020-01-05 NOTE — TOC Progression Note (Signed)
Transition of Care [facility]) - Progression Note    Patient Details  Name: OLUSEGUN GERSTENBERGER MRN: [id number] Date of Birth: 18-Aug-1960  Transition of Care [facility]) CM/SW Contact  Patrice Paradise, LCSW Phone Number: [phone] 01/05/2020, 2:09 PM  Clinical Narrative:     CSW attempted to reach patient's sister Corrie Dandy again however was unable to reach her or leave a message.  TOC team will continue to follow for discharge planning needs.   Expected Discharge Plan: Home/Self Care Barriers to Discharge: No Barriers Identified  Expected Discharge Plan and Services Expected Discharge Plan: Home/Self Care   Discharge Planning Services: CM Consult Post Acute Care Choice: NA   Expected Discharge Date: 01/03/20                 DME Agency: NA       HH Arranged: NA           Social Determinants of Health (SDOH) Interventions    Readmission Risk Interventions Readmission Risk Prevention Plan 01/03/2020  Transportation Screening Complete  Medication Review Oceanographer) Complete  PCP or Specialist appointment within 3-5 days of discharge Complete  HRI or Home Care Consult Complete  SW Recovery Care/Counseling Consult Complete  Palliative Care Screening Not Applicable  Skilled Nursing Facility Not Applicable  Some recent data might be hidden

## 2020-01-06 LAB — GLUCOSE, CAPILLARY
Glucose-Capillary: 136 mg/dL — ABNORMAL HIGH (ref 70–99)
Glucose-Capillary: 139 mg/dL — ABNORMAL HIGH (ref 70–99)
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
## 2020-01-06 NOTE — Discharge Instructions (Signed)
**  Do not give yourself Lantus (Insulin glargine) while you are eating a soft diet. Ok to continue taking Metformin. Follow up with your provider after starting to eat solid foods

## 2020-01-06 NOTE — Progress Notes (Signed)
Patient alert and oriented, denies pain, VSS, iv and tele removed, home medication delivered to the patient by  pharmacy, d/c instruction explain and given to the patient, all questions answered. Patient d/c per order.

## 2020-01-10 ENCOUNTER — Encounter: Payer: Self-pay | Admitting: *Deleted

## 2020-01-10 ENCOUNTER — Telehealth: Payer: Self-pay | Admitting: *Deleted

## 2020-01-10 NOTE — Telephone Encounter (Signed)
Contacted [facility] and scheduled client a ride from [facility] to [facility] in [place] for 2:00 appointment. Gave number for client to get return ride. Waiver form completed.

## 2020-01-10 NOTE — Congregational Nurse Program (Signed)
  Dept: [phone]   Congregational Nurse Program Note  Date of Encounter: 01/10/2020  Past Medical History: Past Medical History:  Diagnosis Date  . Diabetes mellitus without complication (HCC)   . Hepatitis C   . HIV infection (HCC)   . Schizophrenia (HCC)   . Substance abuse [facility])     Encounter Details:  CNP Questionnaire - 01/10/20 0951      Questionnaire   Patient Status Not Applicable    Race Black or African American    Location Patient Served At [employer]    Uninsured Not Applicable    Food No food insecurities    Housing/Utilities No permanent housing    Transportation Yes, need transportation assistance    Interpersonal Safety No, do not feel physically and emotionally safe where you currently live    Medication No medication insecurities    Medical Provider Yes    Referrals Not Applicable    ED Visit Averted Not Applicable    Life-Saving Intervention Made Not Applicable          Client came to [facility] requesting help with transportation to his doctor's appointment today at 2:00. Client was recently in the hospital and was scheduled a doctor's visit for primary care. Transportation provided by [employer] to [employer] in [place]. Nira Conn. RN, CN [phone]

## 2020-01-20 ENCOUNTER — Encounter: Payer: Self-pay | Admitting: *Deleted

## 2020-01-20 ENCOUNTER — Telehealth: Payer: Self-pay | Admitting: *Deleted
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
## 2020-01-20 NOTE — Telephone Encounter (Signed)
Spoke with Judeth Cornfield at [facility]. MD has not refilled medications at this time.
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
## 2020-02-11 ENCOUNTER — Emergency Department (HOSPITAL_COMMUNITY)
Admission: EM | Admit: 2020-02-11 | Discharge: 2020-02-12 | Disposition: A | Discharge status: Home / Self Care | Payer: Medicaid Other | Attending: Emergency Medicine | Admitting: Emergency Medicine

## 2020-02-11 ENCOUNTER — Encounter (HOSPITAL_COMMUNITY): Payer: Self-pay | Admitting: Emergency Medicine

## 2020-02-11 ENCOUNTER — Other Ambulatory Visit: Payer: Self-pay

## 2020-02-11 DIAGNOSIS — Z7984 Long term (current) use of oral hypoglycemic drugs: Secondary | ICD-10-CM | POA: Insufficient documentation

## 2020-02-11 DIAGNOSIS — Z21 Asymptomatic human immunodeficiency virus [HIV] infection status: Secondary | ICD-10-CM | POA: Insufficient documentation

## 2020-02-11 DIAGNOSIS — F17210 Nicotine dependence, cigarettes, uncomplicated: Secondary | ICD-10-CM | POA: Insufficient documentation

## 2020-02-11 DIAGNOSIS — F10120 Alcohol abuse with intoxication, uncomplicated: Secondary | ICD-10-CM | POA: Diagnosis not present

## 2020-02-11 DIAGNOSIS — R462 Strange and inexplicable behavior: Secondary | ICD-10-CM | POA: Diagnosis present

## 2020-02-11 DIAGNOSIS — Z20822 Contact with and (suspected) exposure to covid-19: Secondary | ICD-10-CM | POA: Insufficient documentation

## 2020-02-11 DIAGNOSIS — Y908 Blood alcohol level of 240 mg/100 ml or more: Secondary | ICD-10-CM | POA: Diagnosis not present

## 2020-02-11 DIAGNOSIS — F10920 Alcohol use, unspecified with intoxication, uncomplicated: Secondary | ICD-10-CM

## 2020-02-11 DIAGNOSIS — F1910 Other psychoactive substance abuse, uncomplicated: Secondary | ICD-10-CM | POA: Insufficient documentation

## 2020-02-11 DIAGNOSIS — E119 Type 2 diabetes mellitus without complications: Secondary | ICD-10-CM | POA: Diagnosis not present

## 2020-02-11 LAB — CBC WITH DIFFERENTIAL/PLATELET
Abs Immature Granulocytes: 0 10*3/uL (ref 0.00–0.07)
Basophils Absolute: 0 10*3/uL (ref 0.0–0.1)
Basophils Relative: 0 %
Eosinophils Absolute: 0 10*3/uL (ref 0.0–0.5)
Eosinophils Relative: 1 %
HCT: 37.5 % — ABNORMAL LOW (ref 39.0–52.0)
Hemoglobin: 12.4 g/dL — ABNORMAL LOW (ref 13.0–17.0)
Immature Granulocytes: 0 %
Lymphocytes Relative: 78 %
Lymphs Abs: 2.8 10*3/uL (ref 0.7–4.0)
MCH: 31.1 pg (ref 26.0–34.0)
MCHC: 33.1 g/dL (ref 30.0–36.0)
MCV: 94 fL (ref 80.0–100.0)
Monocytes Absolute: 0.3 10*3/uL (ref 0.1–1.0)
Monocytes Relative: 8 %
Neutro Abs: 0.5 10*3/uL — ABNORMAL LOW (ref 1.7–7.7)
Neutrophils Relative %: 13 %
Platelets: 99 10*3/uL — ABNORMAL LOW (ref 150–400)
RBC: 3.99 MIL/uL — ABNORMAL LOW (ref 4.22–5.81)
RDW: 15.2 % (ref 11.5–15.5)
WBC: 3.6 10*3/uL — ABNORMAL LOW (ref 4.0–10.5)
nRBC: 0 % (ref 0.0–0.2)

## 2020-02-11 LAB — COMPREHENSIVE METABOLIC PANEL
ALT: 52 U/L — ABNORMAL HIGH (ref 0–44)
AST: 88 U/L — ABNORMAL HIGH (ref 15–41)
Albumin: 3.5 g/dL (ref 3.5–5.0)
Alkaline Phosphatase: 57 U/L (ref 38–126)
Anion gap: 9 (ref 5–15)
BUN: 7 mg/dL (ref 6–20)
CO2: 28 mmol/L (ref 22–32)
Calcium: 8.4 mg/dL — ABNORMAL LOW (ref 8.9–10.3)
Chloride: 103 mmol/L (ref 98–111)
Creatinine, Ser: 0.55 mg/dL — ABNORMAL LOW (ref 0.61–1.24)
GFR calc Af Amer: >60 mL/min (ref >60)
GFR calc non Af Amer: >60 mL/min (ref >60)
Glucose, Bld: 81 mg/dL (ref 70–99)
Potassium: 3.5 mmol/L (ref 3.5–5.1)
Sodium: 140 mmol/L (ref 135–145)
Total Bilirubin: 0.5 mg/dL (ref 0.3–1.2)
Total Protein: 8.4 g/dL — ABNORMAL HIGH (ref 6.5–8.1)

## 2020-02-11 LAB — SARS CORONAVIRUS 2 BY RT PCR (HOSPITAL ORDER, PERFORMED IN ~~LOC~~ HOSPITAL LAB): SARS Coronavirus 2: NEGATIVE

## 2020-02-11 LAB — CK: Total CK: 302 U/L (ref 49–397)

## 2020-02-11 LAB — ETHANOL: Alcohol, Ethyl (B): 340 mg/dL (ref <10)

## 2020-02-11 MED ORDER — SODIUM CHLORIDE 0.9 % IV BOLUS
1000.0000 mL | Freq: Once | INTRAVENOUS | Status: AC
  Administered 2020-02-11: 1000 mL via INTRAVENOUS

## 2020-02-11 MED ORDER — SODIUM CHLORIDE 0.9 % IV SOLN: INTRAVENOUS | Status: DC

## 2020-02-11 NOTE — ED Provider Notes (Signed)
[facility]-EMERGENCY DEPT Provider Note   CSN: [id number] Arrival date & time: 02/11/20  1716     History No chief complaint on file.   Derrick Cooper is a 59 y.o. male.  59 year old male who presents via EMS after being found on the side of the road with bizarre behavior.  Patient reportedly was agitated and required 2.5 mg of Haldol IV.  Review the patient's old chart shows that he has a history of schizophrenia as well as substance abuse.  Patient is sleepy at this time and no further history can be obtained.  Patient's blood sugar was above 100.        Past Medical History:  Diagnosis Date  . Diabetes mellitus without complication (HCC)   . Hepatitis C   . HIV infection (HCC)   . Schizophrenia (HCC)   . Substance abuse [facility])     Patient Active Problem List   Diagnosis Date Noted  . Alcohol abuse   . Fracture of mandible (HCC)   . Acute respiratory failure with hypoxia (HCC) 12/27/2019  . Lobar pneumonia (HCC) 12/27/2019  . Community acquired pneumonia of right lung 12/27/2019  . Polysubstance abuse (HCC) 12/10/2019  . Homelessness 12/10/2019  . Substance induced mood disorder (HCC) 12/10/2019  . MDD (major depressive disorder), recurrent episode, severe (HCC) 08/09/2018  . Schizoaffective disorder, bipolar type (HCC)   . Health care maintenance 06/26/2018  . Tobacco use 06/26/2018  . Furuncle of right axilla 11/13/2017  . Hyperlipidemia associated with type 2 diabetes mellitus (HCC) 10/25/2017  . 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 HIV infection (HCC) 09/27/2017  . Chronic hepatitis C without hepatic coma (HCC) 09/27/2017  . Acute non-recurrent maxillary sinusitis 09/27/2017  . Schizophrenia (HCC) 09/07/2017  . Controlled diabetes mellitus with complication, without long-term current use of insulin (HCC) 09/07/2017  . Urinary incontinence  09/07/2017    Past Surgical History:  Procedure Laterality Date  . ORIF MANDIBULAR FRACTURE Left 12/31/2019   Procedure: OPEN REDUCTION INTERNAL FIXATION (ORIF) MANDIBULAR FRACTURE , NASAL  MAXILLO MANDIBULAR FIXATION;  Surgeon: [name], MD;  Location: [facility] OR;  Service: ENT;  Laterality: Left;       Family History  Problem Relation Age of Onset  . Breast cancer Mother   . Lung cancer Father   . Heart attack Father     Social History   Tobacco Use  . Smoking status: Current Every Day Smoker    Packs/day: 0.50    Years: 42.00    Pack years: 21.00    Types: Cigarettes  . Smokeless tobacco: Never Used  Vaping Use  . Vaping Use: Never used  Substance Use Topics  . Alcohol use: Yes    Comment: 8-9 42 ounce beers a day  . Drug use: Yes    Types: Marijuana, Methamphetamines, "Crack" cocaine    Comment: has not used meth since 03/2017, marijuana and crack a couple of times a week    Home Medications Prior to Admission medications   Medication Sig Start Date End Date Taking? Authorizing Provider  atorvastatin (LIPITOR) 40 MG tablet Take 40 mg by mouth daily.  06/04/19   [provider]  bictegravir-emtricitabine-tenofovir AF (BIKTARVY) 50-200-25 MG TABS tablet Take 1 tablet by mouth daily. 01/01/20   Marinda Elk, MD  HYDROcodone-acetaminophen (NORCO/VICODIN) 5-325 MG tablet Take 1-2 tablets by mouth every 4 (four) hours as needed for moderate pain. 01/03/20  Marinda Elk, MD  metFORMIN (GLUCOPHAGE) 500 MG tablet Take 1 tablet (500 mg total) by mouth 2 (two) times daily with a meal. 10/28/19   Malvin Johns, MD  QUEtiapine (SEROQUEL) 200 MG tablet Take 1 tablet (200 mg total) by mouth at bedtime. 10/28/19   Malvin Johns, MD    Allergies    Patient has no known allergies.  Review of Systems   Review of Systems  Unable to perform ROS: Psychiatric disorder    Physical Exam Updated Vital Signs There were no vitals taken for this visit.  Physical  Exam Vitals and nursing note reviewed.  Constitutional:      General: He is not in acute distress.    Appearance: Normal appearance. He is well-developed. He is not toxic-appearing.  HENT:     Head: Normocephalic and atraumatic.  Eyes:     General: Lids are normal.     Conjunctiva/sclera: Conjunctivae normal.     Pupils: Pupils are equal, round, and reactive to light.  Neck:     Thyroid: No thyroid mass.     Trachea: No tracheal deviation.  Cardiovascular:     Rate and Rhythm: Normal rate and regular rhythm.     Heart sounds: Normal heart sounds. No murmur heard.  No gallop.   Pulmonary:     Effort: Pulmonary effort is normal. No respiratory distress.     Breath sounds: Normal breath sounds. No stridor. No decreased breath sounds, wheezing, rhonchi or rales.  Abdominal:     General: Bowel sounds are normal. There is no distension.     Palpations: Abdomen is soft.     Tenderness: There is no abdominal tenderness. There is no rebound.  Musculoskeletal:        General: No tenderness. Normal range of motion.     Cervical back: Normal range of motion and neck supple.  Skin:    General: Skin is warm and dry.     Findings: No abrasion or rash.  Neurological:     Mental Status: He is alert and oriented to person, place, and time.     GCS: GCS eye subscore is 4. GCS verbal subscore is 5. GCS motor subscore is 6.     Cranial Nerves: No cranial nerve deficit.     Sensory: No sensory deficit.  Psychiatric:        Attention and Perception: He is inattentive.        Mood and Affect: Affect is blunt.        Speech: Speech is delayed and slurred.        Behavior: Behavior is slowed and withdrawn.     ED Results / Procedures / Treatments   Labs (all labs ordered are listed, but only abnormal results are displayed) Labs Reviewed  CBC WITH DIFFERENTIAL/PLATELET  COMPREHENSIVE METABOLIC PANEL  ETHANOL  RAPID URINE DRUG SCREEN, HOSP PERFORMED  CK    EKG None  Radiology No  results found.  Procedures Procedures (including critical care time)  Medications Ordered in ED Medications  sodium chloride 0.9 % bolus 1,000 mL (has no administration in time range)  0.9 %  sodium chloride infusion (has no administration in time range)    ED Course  I have reviewed the triage vital signs and the nursing notes.  Pertinent labs & imaging results that were available during my care of the patient were reviewed by me and considered in my medical decision making (see chart for details).    MDM Rules/Calculators/A&P  Patient received Haldol 2.5 mg prior to arrival.  Patient's alcohol level was 340.  He was allowed to metabolize here.  Mild transaminitis likely from his chronic alcohol use.  Patient is now alert.  He was able to eat a meal.  States he has no SI or HI.  Will discharge Final Clinical Impression(s) / ED Diagnoses Final diagnoses:  None    Rx / DC Orders ED Discharge Orders    None       Lorre Nick, MD 02/11/20 2328

## 2020-02-11 NOTE — ED Notes (Signed)
Patient still somnolent.

## 2020-02-11 NOTE — ED Triage Notes (Signed)
Arrives via EMS from the side of the road, found by PD. Non-coherent speech, stated the vampires were biting his neck and getting agitated with EMS. EMS have 2.5 IV Haldol.

## 2020-02-13 ENCOUNTER — Emergency Department (HOSPITAL_COMMUNITY)
Admission: EM | Admit: 2020-02-13 | Discharge: 2020-02-13 | Disposition: A | Discharge status: Home / Self Care | Payer: Medicaid Other | Attending: Emergency Medicine | Admitting: Emergency Medicine

## 2020-02-13 ENCOUNTER — Encounter (HOSPITAL_COMMUNITY): Payer: Self-pay | Admitting: *Deleted

## 2020-02-13 DIAGNOSIS — F10920 Alcohol use, unspecified with intoxication, uncomplicated: Secondary | ICD-10-CM | POA: Insufficient documentation

## 2020-02-13 DIAGNOSIS — Z5321 Procedure and treatment not carried out due to patient leaving prior to being seen by health care provider: Secondary | ICD-10-CM | POA: Diagnosis not present

## 2020-02-13 LAB — PATHOLOGIST SMEAR REVIEW

## 2020-02-13 NOTE — ED Notes (Signed)
No answer for room 

## 2020-02-13 NOTE — ED Triage Notes (Signed)
Presents to ed via [facility] states he was found laying on the sidewalk, heavy ETOH. Patient is alert

## 2020-03-18 ENCOUNTER — Encounter: Payer: Self-pay | Admitting: *Deleted

## 2020-03-18 NOTE — Congregational Nurse Program (Signed)
  Dept: [phone]   Congregational Nurse Program Note  Date of Encounter: 03/18/2020  Past Medical History: Past Medical History:  Diagnosis Date  . Diabetes mellitus without complication (HCC)   . Hepatitis C   . HIV infection (HCC)   . Schizophrenia (HCC)   . Substance abuse [facility])     Encounter Details:  CNP Questionnaire - 03/18/20 0900      Questionnaire   Patient Status Not Applicable    Race Black or African American    Location Patient Served At [employer]    Uninsured Not Applicable    Food No food insecurities    Housing/Utilities No permanent housing    Transportation Yes, need transportation assistance    Interpersonal Safety No, do not feel physically and emotionally safe where you currently live    Medication Yes, have medication insecurities    Medical Provider Yes    Referrals Not Applicable    ED Visit Averted Not Applicable    Life-Saving Intervention Made Not Applicable          Client seen lying in the floor of the [facility]. He appears to be intoxicated and said he had been drinking. Checked his blood sugar 162 and went to office to get blood pressure machine. When writer walked back to lobby, client had left the building. Lielle Vandervort W RN CN [phone]

## 2020-03-23 ENCOUNTER — Encounter: Payer: Self-pay | Admitting: *Deleted

## 2020-03-23 NOTE — Congregational Nurse Program (Signed)
  Dept: [phone]   Congregational Nurse Program Note  Date of Encounter: 03/23/2020  Past Medical History: Past Medical History:  Diagnosis Date  . Diabetes mellitus without complication (HCC)   . Hepatitis C   . HIV infection (HCC)   . Schizophrenia (HCC)   . Substance abuse [facility])     Encounter Details:  CNP Questionnaire - 03/23/20 1414      Questionnaire   Patient Status Not Applicable    Race Black or African American    Location Patient Served At [employer]    Uninsured Not Applicable    Food No food insecurities    Housing/Utilities No permanent housing    Transportation Yes, need transportation assistance    Interpersonal Safety No, do not feel physically and emotionally safe where you currently live    Medication Yes, have medication insecurities    Medical Provider Yes    Referrals Not Applicable    ED Visit Averted Not Applicable    Life-Saving Intervention Made Not Applicable         Pt came into office intoxicated and asked to have his vitals taken. Checked vitals and Cbg. Encouraged client to eat and drink non alcoholic fluids. Client reports he is working with [facility] team about getting into treatment with [facility]. Hazyl Marseille W. RN [phone]

## 2020-04-02 ENCOUNTER — Emergency Department (HOSPITAL_COMMUNITY)
Admission: EM | Admit: 2020-04-02 | Discharge: 2020-04-02 | Discharge status: Against Medical Advice | Payer: Medicaid Other | Attending: Emergency Medicine | Admitting: Emergency Medicine

## 2020-04-02 DIAGNOSIS — Z531 Procedure and treatment not carried out because of patient's decision for reasons of belief and group pressure: Secondary | ICD-10-CM | POA: Insufficient documentation

## 2020-04-02 LAB — CBG MONITORING, ED: Glucose-Capillary: 129 mg/dL — ABNORMAL HIGH (ref 70–99)

## 2020-04-17 ENCOUNTER — Emergency Department (HOSPITAL_COMMUNITY)
Admission: EM | Admit: 2020-04-17 | Discharge: 2020-04-17 | Disposition: A | Discharge status: Home / Self Care | Payer: Medicaid Other | Attending: Emergency Medicine | Admitting: Emergency Medicine

## 2020-04-17 ENCOUNTER — Encounter (HOSPITAL_COMMUNITY): Payer: Self-pay | Admitting: Pediatrics

## 2020-04-17 ENCOUNTER — Other Ambulatory Visit: Payer: Self-pay

## 2020-04-17 DIAGNOSIS — Z5321 Procedure and treatment not carried out due to patient leaving prior to being seen by health care provider: Secondary | ICD-10-CM | POA: Insufficient documentation

## 2020-04-17 DIAGNOSIS — F1010 Alcohol abuse, uncomplicated: Secondary | ICD-10-CM | POA: Diagnosis present

## 2020-04-17 LAB — COMPREHENSIVE METABOLIC PANEL
ALT: 89 U/L — ABNORMAL HIGH (ref 0–44)
AST: 180 U/L — ABNORMAL HIGH (ref 15–41)
Albumin: 3 g/dL — ABNORMAL LOW (ref 3.5–5.0)
Alkaline Phosphatase: 79 U/L (ref 38–126)
Anion gap: 12 (ref 5–15)
BUN: 9 mg/dL (ref 6–20)
CO2: 24 mmol/L (ref 22–32)
Calcium: 8.4 mg/dL — ABNORMAL LOW (ref 8.9–10.3)
Chloride: 101 mmol/L (ref 98–111)
Creatinine, Ser: 0.91 mg/dL (ref 0.61–1.24)
GFR calc Af Amer: >60 mL/min (ref >60)
GFR calc non Af Amer: >60 mL/min (ref >60)
Glucose, Bld: 81 mg/dL (ref 70–99)
Potassium: 4.1 mmol/L (ref 3.5–5.1)
Sodium: 137 mmol/L (ref 135–145)
Total Bilirubin: 0.5 mg/dL (ref 0.3–1.2)
Total Protein: 9.5 g/dL — ABNORMAL HIGH (ref 6.5–8.1)

## 2020-04-17 LAB — CBC
HCT: 37.8 % — ABNORMAL LOW (ref 39.0–52.0)
Hemoglobin: 12.6 g/dL — ABNORMAL LOW (ref 13.0–17.0)
MCH: 30.8 pg (ref 26.0–34.0)
MCHC: 33.3 g/dL (ref 30.0–36.0)
MCV: 92.4 fL (ref 80.0–100.0)
Platelets: 82 10*3/uL — ABNORMAL LOW (ref 150–400)
RBC: 4.09 MIL/uL — ABNORMAL LOW (ref 4.22–5.81)
RDW: 14.5 % (ref 11.5–15.5)
WBC: 2.5 10*3/uL — ABNORMAL LOW (ref 4.0–10.5)
nRBC: 0 % (ref 0.0–0.2)

## 2020-04-17 LAB — ETHANOL: Alcohol, Ethyl (B): 306 mg/dL (ref <10)

## 2020-04-17 NOTE — ED Triage Notes (Signed)
Emergency Medicine Provider Triage Evaluation Note  JEX STRAUSBAUGH , a 59 y.o. male  was evaluated in triage.  Pt complains of fall.  Patient states he thinks he fell, but does not remember.  He reports alcohol use today, crack use yesterday.  He denies pain or vision change.  Review of Systems  Positive: Fall, head trauma, polysubstance abuse Negative: Pain  Physical Exam  There were no vitals taken for this visit. Gen:   Awake, no distress  HEENT:  Abrasion of R maxilla and lac over R eye Resp:  Normal effort  Cardiac:  Normal rate  Abd:   Nondistended, nontender  MSK:   Moves extremities without difficulty  Neuro:  Appears intoxicated  Medical Decision Making  Medically screening exam initiated at 3:11 PM.  Appropriate orders placed.  TIMOTHEE GALI was informed that the remainder of the evaluation will be completed by another provider, this initial triage assessment does not replace that evaluation, and the importance of remaining in the ED until their evaluation is complete.  Clinical Impression   Patient with reported fall.  Unreliable historian due to alcohol use.  Will obtain CT head and maxillofacial.  Will obtain basic labs.   Alveria Apley, PA-C 04/17/20 1513

## 2020-04-17 NOTE — ED Triage Notes (Signed)
Arrived from homeless shelter; reported fall; + ETOH; unknown LOC;

## 2020-04-17 NOTE — ED Notes (Signed)
Pt leave emergency department

## 2020-04-23 ENCOUNTER — Encounter (HOSPITAL_COMMUNITY): Payer: Self-pay

## 2020-04-23 ENCOUNTER — Emergency Department (HOSPITAL_COMMUNITY)
Admission: EM | Admit: 2020-04-23 | Discharge: 2020-04-23 | Disposition: A | Discharge status: Home / Self Care | Payer: Medicaid Other | Attending: Emergency Medicine | Admitting: Emergency Medicine

## 2020-04-23 DIAGNOSIS — E1140 Type 2 diabetes mellitus with diabetic neuropathy, unspecified: Secondary | ICD-10-CM | POA: Insufficient documentation

## 2020-04-23 DIAGNOSIS — F1010 Alcohol abuse, uncomplicated: Secondary | ICD-10-CM | POA: Insufficient documentation

## 2020-04-23 DIAGNOSIS — Z794 Long term (current) use of insulin: Secondary | ICD-10-CM | POA: Insufficient documentation

## 2020-04-23 DIAGNOSIS — F17210 Nicotine dependence, cigarettes, uncomplicated: Secondary | ICD-10-CM | POA: Insufficient documentation

## 2020-04-23 DIAGNOSIS — E1169 Type 2 diabetes mellitus with other specified complication: Secondary | ICD-10-CM | POA: Diagnosis not present

## 2020-04-23 LAB — CBC WITH DIFFERENTIAL/PLATELET
Abs Immature Granulocytes: 0.01 10*3/uL (ref 0.00–0.07)
Basophils Absolute: 0 10*3/uL (ref 0.0–0.1)
Basophils Relative: 1 %
Eosinophils Absolute: 0 10*3/uL (ref 0.0–0.5)
Eosinophils Relative: 0 %
HCT: 37 % — ABNORMAL LOW (ref 39.0–52.0)
Hemoglobin: 13 g/dL (ref 13.0–17.0)
Immature Granulocytes: 0 %
Lymphocytes Relative: 44 %
Lymphs Abs: 1.5 10*3/uL (ref 0.7–4.0)
MCH: 31 pg (ref 26.0–34.0)
MCHC: 35.1 g/dL (ref 30.0–36.0)
MCV: 88.3 fL (ref 80.0–100.0)
Monocytes Absolute: 0.4 10*3/uL (ref 0.1–1.0)
Monocytes Relative: 13 %
Neutro Abs: 1.4 10*3/uL — ABNORMAL LOW (ref 1.7–7.7)
Neutrophils Relative %: 42 %
Platelets: 78 10*3/uL — ABNORMAL LOW (ref 150–400)
RBC: 4.19 MIL/uL — ABNORMAL LOW (ref 4.22–5.81)
RDW: 14.2 % (ref 11.5–15.5)
WBC: 3.5 10*3/uL — ABNORMAL LOW (ref 4.0–10.5)
nRBC: 0 % (ref 0.0–0.2)

## 2020-04-23 LAB — COMPREHENSIVE METABOLIC PANEL
ALT: 81 U/L — ABNORMAL HIGH (ref 0–44)
AST: 141 U/L — ABNORMAL HIGH (ref 15–41)
Albumin: 3.3 g/dL — ABNORMAL LOW (ref 3.5–5.0)
Alkaline Phosphatase: 90 U/L (ref 38–126)
Anion gap: 9 (ref 5–15)
BUN: 10 mg/dL (ref 6–20)
CO2: 27 mmol/L (ref 22–32)
Calcium: 8.9 mg/dL (ref 8.9–10.3)
Chloride: 97 mmol/L — ABNORMAL LOW (ref 98–111)
Creatinine, Ser: 0.74 mg/dL (ref 0.61–1.24)
GFR calc Af Amer: >60 mL/min (ref >60)
GFR calc non Af Amer: >60 mL/min (ref >60)
Glucose, Bld: 109 mg/dL — ABNORMAL HIGH (ref 70–99)
Potassium: 3.6 mmol/L (ref 3.5–5.1)
Sodium: 133 mmol/L — ABNORMAL LOW (ref 135–145)
Total Bilirubin: 0.9 mg/dL (ref 0.3–1.2)
Total Protein: 9.9 g/dL — ABNORMAL HIGH (ref 6.5–8.1)

## 2020-04-23 LAB — ETHANOL: Alcohol, Ethyl (B): <10 mg/dL (ref <10)

## 2020-04-23 MED ORDER — LORAZEPAM 1 MG PO TABS
1.0000 mg | ORAL_TABLET | Freq: Once | ORAL | Status: AC
  Administered 2020-04-23: 1 mg via ORAL
  Filled 2020-04-23: qty 1

## 2020-04-23 NOTE — ED Notes (Signed)
Save blue in main lab 

## 2020-04-23 NOTE — ED Triage Notes (Signed)
Pt presents with c/o alcohol withdrawal. Pt is in police custody, comes with the sheriff reporting that he has been in jail for a few hours and is withdrawing from alcohol, last drink yesterday.

## 2020-04-23 NOTE — ED Provider Notes (Signed)
[name] Provider Note   CSN: [id number] Arrival date & time: 04/23/20  0753     History Chief Complaint  Patient presents with  . Withdrawal    Derrick Cooper is a 59 y.o. male.  59 year old male with prior medical history as detailed below presents for evaluation.  Patient reports daily heavy drinking.  Patient is currently on my enforcement custody.  He was arrested yesterday secondary to carrying an open alcoholic beverage. He is pending court this afternoon.   Patient reports feeling shaky this morning.  His last alcoholic drink was yesterday.  He denies prior history of seizures secondary to DTs.  He denies other complaint.  Law enforcement is hopeful that he can be "tuned up" prior to his court appearance this afternoon at 2pm  The history is provided by the patient and medical records.  Illness Location:  "shaking" - suspected ETOH withdrawal  Severity:  Mild Onset quality:  Gradual Duration:  12 hours Timing:  Intermittent Progression:  Unchanged Chronicity:  New      Past Medical History:  Diagnosis Date  . Diabetes mellitus without complication (HCC)   . Hepatitis C   . HIV infection (HCC)   . Schizophrenia (HCC)   . Substance abuse [facility])     Patient Active Problem List   Diagnosis Date Noted  . Alcohol abuse   . Fracture of mandible (HCC)   . Acute respiratory failure with hypoxia (HCC) 12/27/2019  . Lobar pneumonia (HCC) 12/27/2019  . Community acquired pneumonia of right lung 12/27/2019  . Polysubstance abuse (HCC) 12/10/2019  . Homelessness 12/10/2019  . Substance induced mood disorder (HCC) 12/10/2019  . MDD (major depressive disorder), recurrent episode, severe (HCC) 08/09/2018  . Schizoaffective disorder, bipolar type (HCC)   . Health care maintenance 06/26/2018  . Tobacco use 06/26/2018  . Furuncle of right axilla 11/13/2017  . Hyperlipidemia associated with type 2 diabetes mellitus (HCC) 10/25/2017  .  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 HIV infection (HCC) 09/27/2017  . Chronic hepatitis C without hepatic coma (HCC) 09/27/2017  . Acute non-recurrent maxillary sinusitis 09/27/2017  . Schizophrenia (HCC) 09/07/2017  . Controlled diabetes mellitus with complication, without long-term current use of insulin (HCC) 09/07/2017  . Urinary incontinence 09/07/2017    Past Surgical History:  Procedure Laterality Date  . ORIF MANDIBULAR FRACTURE Left 12/31/2019   Procedure: OPEN REDUCTION INTERNAL FIXATION (ORIF) MANDIBULAR FRACTURE , NASAL  MAXILLO MANDIBULAR FIXATION;  Surgeon: [name], MD;  Location: [facility] OR;  Service: ENT;  Laterality: Left;       Family History  Problem Relation Age of Onset  . Breast cancer Mother   . Lung cancer Father   . Heart attack Father     Social History   Tobacco Use  . Smoking status: Current Every Day Smoker    Packs/day: 0.50    Years: 42.00    Pack years: 21.00    Types: Cigarettes  . Smokeless tobacco: Never Used  Vaping Use  . Vaping Use: Never used  Substance Use Topics  . Alcohol use: Yes    Comment: 8-9 42 ounce beers a day  . Drug use: Yes    Types: Marijuana, Methamphetamines, "Crack" cocaine    Comment: has not used meth since 03/2017, marijuana and crack a couple of times a week    Home Medications Prior to Admission medications   Medication Sig  Start Date End Date Taking? Authorizing Provider  atorvastatin (LIPITOR) 40 MG tablet Take 40 mg by mouth daily.  06/04/19   [provider]  bictegravir-emtricitabine-tenofovir AF (BIKTARVY) 50-200-25 MG TABS tablet Take 1 tablet by mouth daily. 01/01/20   Marinda Elk, MD  HYDROcodone-acetaminophen (NORCO/VICODIN) 5-325 MG tablet Take 1-2 tablets by mouth every 4 (four) hours as needed for moderate pain. 01/03/20   Marinda Elk, MD  metFORMIN (GLUCOPHAGE) 500 MG  tablet Take 1 tablet (500 mg total) by mouth 2 (two) times daily with a meal. 10/28/19   Malvin Johns, MD  QUEtiapine (SEROQUEL) 200 MG tablet Take 1 tablet (200 mg total) by mouth at bedtime. 10/28/19   Malvin Johns, MD    Allergies    Patient has no known allergies.  Review of Systems   Review of Systems  All other systems reviewed and are negative.   Physical Exam Updated Vital Signs BP (!) 131/101 (BP Location: Left Arm)   Pulse (!) 105   Temp 98.2 F (36.8 C) (Oral)   Resp 17   SpO2 98%   Physical Exam Vitals and nursing note reviewed.  Constitutional:      General: He is not in acute distress.    Appearance: Normal appearance. He is well-developed.  HENT:     Head: Normocephalic and atraumatic.  Eyes:     Conjunctiva/sclera: Conjunctivae normal.     Pupils: Pupils are equal, round, and reactive to light.  Cardiovascular:     Rate and Rhythm: Normal rate and regular rhythm.     Heart sounds: Normal heart sounds.  Pulmonary:     Effort: Pulmonary effort is normal. No respiratory distress.     Breath sounds: Normal breath sounds.  Abdominal:     General: There is no distension.     Palpations: Abdomen is soft.     Tenderness: There is no abdominal tenderness.  Musculoskeletal:        General: No deformity. Normal range of motion.     Cervical back: Normal range of motion and neck supple.  Skin:    General: Skin is warm and dry.  Neurological:     General: No focal deficit present.     Mental Status: He is alert and oriented to person, place, and time. Mental status is at baseline.     ED Results / Procedures / Treatments   Labs (all labs ordered are listed, but only abnormal results are displayed) Labs Reviewed  COMPREHENSIVE METABOLIC PANEL - Abnormal; Notable for the following components:      Result Value   Sodium 133 (*)    Chloride 97 (*)    Glucose, Bld 109 (*)    Total Protein 9.9 (*)    Albumin 3.3 (*)    AST 141 (*)    ALT 81 (*)    All other  components within normal limits  CBC WITH DIFFERENTIAL/PLATELET - Abnormal; Notable for the following components:   WBC 3.5 (*)    RBC 4.19 (*)    HCT 37.0 (*)    Platelets 78 (*)    Neutro Abs 1.4 (*)    All other components within normal limits  ETHANOL    EKG None  Radiology No results found.  Procedures Procedures (including critical care time)  Medications Ordered in ED Medications  LORazepam (ATIVAN) tablet 1 mg (has no administration in time range)    ED Course  I have reviewed the triage vital signs and the nursing notes.  Pertinent labs & imaging results that were available during my care of the patient were reviewed by me and considered in my medical decision making (see chart for details).    MDM Rules/Calculators/A&P                          MDM  Screen complete  Derrick Cooper was evaluated in Emergency Department on 04/23/2020 for the symptoms described in the history of present illness. He was evaluated in the context of the global COVID-19 pandemic, which necessitated consideration that the patient might be at risk for infection with the SARS-CoV-2 virus that causes COVID-19. Institutional protocols and algorithms that pertain to the evaluation of patients at risk for COVID-19 are in a state of rapid change based on information released by regulatory bodies including the CDC and federal and state organizations. These policies and algorithms were followed during the patient's care in the ED.  Patient is presenting in law enforcement custody regarding concern for possible early alcohol withdrawal.  Patient reports that he drinks heavily.  He has been enforcement custody for approximately 24 hours.  He is due for court this afternoon around 2 PM.  Afterwards, per law enforcement, he will be released.  Patient without overt evidence of significant alcohol withdrawal during his ED observation.  Patient was given several doses of p.o. Ativan with improvement in his  reported tremor.  Importance of close follow-up is stressed.  Patient is released into law enforcement custody.  Strict return precautions given and understood.  Final Clinical Impression(s) / ED Diagnoses Final diagnoses:  Alcohol abuse    Rx / DC Orders ED Discharge Orders    None       Wynetta Fines, MD 04/23/20 1232

## 2020-04-23 NOTE — Discharge Instructions (Addendum)
Please return for any problem.   Drink alcohol in moderation. 

## 2020-04-29 ENCOUNTER — Emergency Department (HOSPITAL_COMMUNITY): Payer: Medicaid Other

## 2020-04-29 ENCOUNTER — Emergency Department (HOSPITAL_COMMUNITY)
Admission: EM | Admit: 2020-04-29 | Discharge: 2020-05-04 | Disposition: A | Discharge status: Home / Self Care | Payer: Medicaid Other | Attending: Emergency Medicine | Admitting: Emergency Medicine

## 2020-04-29 ENCOUNTER — Other Ambulatory Visit: Payer: Self-pay

## 2020-04-29 DIAGNOSIS — Z79899 Other long term (current) drug therapy: Secondary | ICD-10-CM | POA: Insufficient documentation

## 2020-04-29 DIAGNOSIS — Z7984 Long term (current) use of oral hypoglycemic drugs: Secondary | ICD-10-CM | POA: Diagnosis not present

## 2020-04-29 DIAGNOSIS — N390 Urinary tract infection, site not specified: Secondary | ICD-10-CM | POA: Insufficient documentation

## 2020-04-29 DIAGNOSIS — S066X0A Traumatic subarachnoid hemorrhage without loss of consciousness, initial encounter: Secondary | ICD-10-CM | POA: Diagnosis not present

## 2020-04-29 DIAGNOSIS — S0101XA Laceration without foreign body of scalp, initial encounter: Secondary | ICD-10-CM | POA: Insufficient documentation

## 2020-04-29 DIAGNOSIS — F10921 Alcohol use, unspecified with intoxication delirium: Secondary | ICD-10-CM | POA: Insufficient documentation

## 2020-04-29 DIAGNOSIS — Y908 Blood alcohol level of 240 mg/100 ml or more: Secondary | ICD-10-CM | POA: Diagnosis not present

## 2020-04-29 DIAGNOSIS — I609 Nontraumatic subarachnoid hemorrhage, unspecified: Secondary | ICD-10-CM

## 2020-04-29 DIAGNOSIS — W19XXXA Unspecified fall, initial encounter: Secondary | ICD-10-CM | POA: Insufficient documentation

## 2020-04-29 DIAGNOSIS — F17210 Nicotine dependence, cigarettes, uncomplicated: Secondary | ICD-10-CM | POA: Insufficient documentation

## 2020-04-29 DIAGNOSIS — E1140 Type 2 diabetes mellitus with diabetic neuropathy, unspecified: Secondary | ICD-10-CM | POA: Diagnosis not present

## 2020-04-29 DIAGNOSIS — Z20822 Contact with and (suspected) exposure to covid-19: Secondary | ICD-10-CM | POA: Diagnosis not present

## 2020-04-29 LAB — PROTIME-INR
INR: 1.3 — ABNORMAL HIGH (ref 0.8–1.2)
Prothrombin Time: 15.6 seconds — ABNORMAL HIGH (ref 11.4–15.2)

## 2020-04-29 LAB — RAPID URINE DRUG SCREEN, HOSP PERFORMED
Amphetamines: NOT DETECTED
Barbiturates: NOT DETECTED
Benzodiazepines: NOT DETECTED
Cocaine: POSITIVE — AB
Opiates: NOT DETECTED
Tetrahydrocannabinol: NOT DETECTED

## 2020-04-29 LAB — COMPREHENSIVE METABOLIC PANEL
ALT: 69 U/L — ABNORMAL HIGH (ref 0–44)
AST: 85 U/L — ABNORMAL HIGH (ref 15–41)
Albumin: 2.7 g/dL — ABNORMAL LOW (ref 3.5–5.0)
Alkaline Phosphatase: 64 U/L (ref 38–126)
Anion gap: 10 (ref 5–15)
BUN: 9 mg/dL (ref 6–20)
CO2: 22 mmol/L (ref 22–32)
Calcium: 8.3 mg/dL — ABNORMAL LOW (ref 8.9–10.3)
Chloride: 105 mmol/L (ref 98–111)
Creatinine, Ser: 0.83 mg/dL (ref 0.61–1.24)
GFR calc Af Amer: >60 mL/min (ref >60)
GFR calc non Af Amer: >60 mL/min (ref >60)
Glucose, Bld: 89 mg/dL (ref 70–99)
Potassium: 4.1 mmol/L (ref 3.5–5.1)
Sodium: 137 mmol/L (ref 135–145)
Total Bilirubin: 0.7 mg/dL (ref 0.3–1.2)
Total Protein: 8.2 g/dL — ABNORMAL HIGH (ref 6.5–8.1)

## 2020-04-29 LAB — CBC WITH DIFFERENTIAL/PLATELET
Abs Immature Granulocytes: 0 10*3/uL (ref 0.00–0.07)
Basophils Absolute: 0 10*3/uL (ref 0.0–0.1)
Basophils Relative: 0 %
Eosinophils Absolute: 0 10*3/uL (ref 0.0–0.5)
Eosinophils Relative: 0 %
HCT: 33.3 % — ABNORMAL LOW (ref 39.0–52.0)
Hemoglobin: 11.3 g/dL — ABNORMAL LOW (ref 13.0–17.0)
Lymphocytes Relative: 60 %
Lymphs Abs: 2.2 10*3/uL (ref 0.7–4.0)
MCH: 31.1 pg (ref 26.0–34.0)
MCHC: 33.9 g/dL (ref 30.0–36.0)
MCV: 91.7 fL (ref 80.0–100.0)
Monocytes Absolute: 0.3 10*3/uL (ref 0.1–1.0)
Monocytes Relative: 9 %
Neutro Abs: 1.1 10*3/uL — ABNORMAL LOW (ref 1.7–7.7)
Neutrophils Relative %: 31 %
Platelets: 187 10*3/uL (ref 150–400)
RBC: 3.63 MIL/uL — ABNORMAL LOW (ref 4.22–5.81)
RDW: 13.8 % (ref 11.5–15.5)
WBC: 3.7 10*3/uL — ABNORMAL LOW (ref 4.0–10.5)
nRBC: 0 % (ref 0.0–0.2)
nRBC: 0 /100 WBC

## 2020-04-29 LAB — ETHANOL: Alcohol, Ethyl (B): 228 mg/dL — ABNORMAL HIGH (ref <10)

## 2020-04-29 LAB — URINALYSIS, COMPLETE (UACMP) WITH MICROSCOPIC
Bilirubin Urine: NEGATIVE
Glucose, UA: NEGATIVE mg/dL
Ketones, ur: NEGATIVE mg/dL
Nitrite: POSITIVE — AB
Protein, ur: NEGATIVE mg/dL
Specific Gravity, Urine: 1.008 (ref 1.005–1.030)
pH: 5 (ref 5.0–8.0)

## 2020-04-29 LAB — LACTIC ACID, PLASMA: Lactic Acid, Venous: 0.7 mmol/L (ref 0.5–1.9)

## 2020-04-29 LAB — RESP PANEL BY RT PCR (RSV, FLU A&B, COVID)
Influenza A by PCR: NEGATIVE
Influenza B by PCR: NEGATIVE
Respiratory Syncytial Virus by PCR: NEGATIVE
SARS Coronavirus 2 by RT PCR: NEGATIVE

## 2020-04-29 LAB — CBG MONITORING, ED: Glucose-Capillary: 88 mg/dL (ref 70–99)

## 2020-04-29 MED ORDER — SODIUM CHLORIDE 0.9 % IV BOLUS
1000.0000 mL | Freq: Once | INTRAVENOUS | Status: AC
  Administered 2020-04-29: 1000 mL via INTRAVENOUS

## 2020-04-29 MED ORDER — SODIUM CHLORIDE 0.9 % IV SOLN
1.0000 g | Freq: Once | INTRAVENOUS | Status: AC
  Administered 2020-04-29: 1 g via INTRAVENOUS
  Filled 2020-04-29: qty 10

## 2020-04-29 MED ORDER — SODIUM CHLORIDE 0.9 % IV SOLN: INTRAVENOUS | Status: DC

## 2020-04-29 MED ORDER — CEPHALEXIN 500 MG PO CAPS: 500.0000 mg | ORAL_CAPSULE | Freq: Two times a day (BID) | ORAL | 0 refills | Status: AC

## 2020-04-29 NOTE — ED Triage Notes (Signed)
Pt BIB [facility] w/ complaints of a fall. Pt fell on concrete w/ loss of consciousness. Pt reports drinking ETOH today per EMS pt drank 6 40 oz of beer today. Pt is complaining of head pain.  Pt reportedly told EMS he thought he was "[place]" and thought a "pen was a knife". GCS of 13. BP 90-100 systolic per EMS. All other VSS.

## 2020-04-29 NOTE — Discharge Instructions (Signed)
You have been diagnosed with multiple abnormalities.  It is important you follow-up with your physician and our neurosurgery colleagues to ensure resolution of your infection, and bleeding in your brain.  Drink alcohol only in moderation.  Return here for concerning changes in your condition.

## 2020-04-29 NOTE — ED Provider Notes (Signed)
[facility] EMERGENCY DEPARTMENT Provider Note   CSN: [id number] Arrival date & time: 04/29/20  1756     History Chief Complaint  Patient presents with  . Fall    Derrick Cooper is a 59 y.o. male.  HPI   Presents via EMS after a fall. The patient himself is sitting upright, on a gurney during my initial evaluation. Patient cannot provide details of what occurred, states that he has pain in the back of his head, seemingly denies pain in his neck, chest, abdomen. EMS notes that patient was reportedly drinking alcohol, had possibly 6 40 ounce beers, had a fall on the concrete with loss of consciousness.  The patient himself knows me that he is in [facility], cannot provide any details about his current condition. Level 5 caveat secondary to confusion.  No details obtained by chart review note for patient being in police custody 6 days ago, requiring ED evaluation for possible withdrawal.  Past Medical History:  Diagnosis Date  . Diabetes mellitus without complication (HCC)   . Hepatitis C   . HIV infection (HCC)   . Schizophrenia (HCC)   . Substance abuse [facility])     Patient Active Problem List   Diagnosis Date Noted  . Alcohol abuse   . Fracture of mandible (HCC)   . Acute respiratory failure with hypoxia (HCC) 12/27/2019  . Lobar pneumonia (HCC) 12/27/2019  . Community acquired pneumonia of right lung 12/27/2019  . Polysubstance abuse (HCC) 12/10/2019  . Homelessness 12/10/2019  . Substance induced mood disorder (HCC) 12/10/2019  . MDD (major depressive disorder), recurrent episode, severe (HCC) 08/09/2018  . Schizoaffective disorder, bipolar type (HCC)   . Health care maintenance 06/26/2018  . Tobacco use 06/26/2018  . Furuncle of right axilla 11/13/2017  . Hyperlipidemia associated with type 2 diabetes mellitus (HCC) 10/25/2017  . 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 HIV infection (HCC) 09/27/2017  . Chronic hepatitis C without hepatic coma (HCC) 09/27/2017  . Acute non-recurrent maxillary sinusitis 09/27/2017  . Schizophrenia (HCC) 09/07/2017  . Controlled diabetes mellitus with complication, without long-term current use of insulin (HCC) 09/07/2017  . Urinary incontinence 09/07/2017    Past Surgical History:  Procedure Laterality Date  . ORIF MANDIBULAR FRACTURE Left 12/31/2019   Procedure: OPEN REDUCTION INTERNAL FIXATION (ORIF) MANDIBULAR FRACTURE , NASAL  MAXILLO MANDIBULAR FIXATION;  Surgeon: [name], MD;  Location: [facility] OR;  Service: ENT;  Laterality: Left;       Family History  Problem Relation Age of Onset  . Breast cancer Mother   . Lung cancer Father   . Heart attack Father     Social History   Tobacco Use  . Smoking status: Current Every Day Smoker    Packs/day: 0.50    Years: 42.00    Pack years: 21.00    Types: Cigarettes  . Smokeless tobacco: Never Used  Vaping Use  . Vaping Use: Never used  Substance Use Topics  . Alcohol use: Yes    Comment: 8-9 42 ounce beers a day  . Drug use: Yes    Types: Marijuana, Methamphetamines, "Crack" cocaine    Comment: has not used meth since 03/2017, marijuana and crack a couple of times a week    Home Medications Prior to Admission medications   Medication Sig Start Date End Date Taking? Authorizing Provider  atorvastatin (LIPITOR) 40 MG tablet Take 40 mg by  mouth daily.  06/04/19   [provider]  bictegravir-emtricitabine-tenofovir AF (BIKTARVY) 50-200-25 MG TABS tablet Take 1 tablet by mouth daily. 01/01/20   Marinda Elk, MD  HYDROcodone-acetaminophen (NORCO/VICODIN) 5-325 MG tablet Take 1-2 tablets by mouth every 4 (four) hours as needed for moderate pain. Patient not taking: Reported on 04/23/2020 01/03/20   Marinda Elk, MD  lisinopril (ZESTRIL) 5 MG tablet Take 5 mg by mouth daily. 01/20/20   [provider]  metFORMIN  (GLUCOPHAGE) 500 MG tablet Take 1 tablet (500 mg total) by mouth 2 (two) times daily with a meal. 10/28/19   Malvin Johns, MD  QUEtiapine (SEROQUEL) 200 MG tablet Take 1 tablet (200 mg total) by mouth at bedtime. 10/28/19   Malvin Johns, MD    Allergies    Patient has no known allergies.  Review of Systems   Review of Systems  Unable to perform ROS: Mental status change    Physical Exam Updated Vital Signs BP (!) 137/97   Pulse 75   Temp 98.3 F (36.8 C)   Resp 19   Ht 6\' 2"  (1.88 m)   Wt 67.1 kg   SpO2 100%   BMI 19.00 kg/m   Physical Exam Vitals and nursing note reviewed.  Constitutional:      Appearance: He is well-developed.     Comments: Alert speaking, confused, no obvious distress.  HENT:     Head: Normocephalic.   Eyes:     Conjunctiva/sclera: Conjunctivae normal.  Cardiovascular:     Rate and Rhythm: Normal rate and regular rhythm.  Pulmonary:     Effort: Pulmonary effort is normal. No respiratory distress.     Breath sounds: No stridor.  Abdominal:     General: There is no distension.     Tenderness: There is no abdominal tenderness. There is no guarding.  Musculoskeletal:        General: No deformity.  Skin:    General: Skin is warm and dry.  Neurological:     Comments: Diffuse atrophy, patient moves all extremities spontaneously, he is confused, to location, place, not to self.  Psychiatric:        Cognition and Memory: Memory is impaired.      ED Results / Procedures / Treatments   Labs (all labs ordered are listed, but only abnormal results are displayed) Labs Reviewed  COMPREHENSIVE METABOLIC PANEL - Abnormal; Notable for the following components:      Result Value   Calcium 8.3 (*)    Total Protein 8.2 (*)    Albumin 2.7 (*)    AST 85 (*)    ALT 69 (*)    All other components within normal limits  RAPID URINE DRUG SCREEN, HOSP PERFORMED - Abnormal; Notable for the following components:   Cocaine POSITIVE (*)    All other components  within normal limits  ETHANOL - Abnormal; Notable for the following components:   Alcohol, Ethyl (B) 228 (*)    All other components within normal limits  CBC WITH DIFFERENTIAL/PLATELET - Abnormal; Notable for the following components:   WBC 3.7 (*)    RBC 3.63 (*)    Hemoglobin 11.3 (*)    HCT 33.3 (*)    Neutro Abs 1.1 (*)    All other components within normal limits  PROTIME-INR - Abnormal; Notable for the following components:   Prothrombin Time 15.6 (*)    INR 1.3 (*)    All other components within normal limits  URINALYSIS, COMPLETE (UACMP)  WITH MICROSCOPIC - Abnormal; Notable for the following components:   APPearance HAZY (*)    Hgb urine dipstick SMALL (*)    Nitrite POSITIVE (*)    Leukocytes,Ua MODERATE (*)    Bacteria, UA MANY (*)    All other components within normal limits  RESP PANEL BY RT PCR (RSV, FLU A&B, COVID)  LACTIC ACID, PLASMA  CBG MONITORING, ED    EKG EKG Interpretation  Date/Time:  Wednesday April 29 2020 18:04:02 EDT Ventricular Rate:  83 PR Interval:    QRS Duration: 92 QT Interval:  383 QTC Calculation: 450 R Axis:   71 Text Interpretation: Sinus rhythm Left ventricular hypertrophy Anterior Q waves, possibly due to LVH Artifact Abnormal ekg Confirmed by Gerhard MunchLockwood, Jovoni Borkenhagen [id number]) on 04/29/2020 8:09:03 PM   Radiology DG Chest Port 1 View  Result Date: 04/29/2020 CLINICAL DATA:  Status post trauma. EXAM: PORTABLE CHEST 1 VIEW COMPARISON:  Dec 28, 2019 FINDINGS: The heart size and mediastinal contours are within normal limits. Both lungs are clear. The visualized skeletal structures are unremarkable. IMPRESSION: No active disease. Electronically Signed   By: Aram Candelahaddeus  Houston M.D.   On: 04/29/2020 18:59    Procedures Procedures (including critical care time)  Medications Ordered in ED Medications  sodium chloride 0.9 % bolus 1,000 mL (0 mLs Intravenous Stopped 04/29/20 1919)    And  0.9 %  sodium chloride infusion (has no administration  in time range)    ED Course  I have reviewed the triage vital signs and the nursing notes.  Pertinent labs & imaging results that were available during my care of the patient were reviewed by me and considered in my medical decision making (see chart for details).     On repeat exam patient is in similar condition.  9:53 PM Labs notable for substantial alcohol intoxication, cocaine positive toxicology screen, and urinary tract infection. CT scanner notable for subacute right orbital fracture, subdural and subarachnoid hemorrhage.  Update: I discussed the patient's case with our neurosurgery colleagues.  We discussed the images, the patient's presentation, concern for intoxication.  No indication for admission based on the patient's hemorrhage.  He is not on blood thinner, can follow-up with them in the clinic in this regard.  Update: Patient now oriented x3, sitting up, watching television.  We discussed all findings, importance of follow-up, importance of medications.  This adult male with history of polysubstance abuse presents acutely intoxicated, after fall apparently patient is found to have evidence for intoxication, cocaine use, urinary tract infection and small subdural and subarachnoid hemorrhage. Patient required a long period of monitoring, to improve his cognition, and per neurosurgery, no indication for admission based on his hemorrhage, other findings are generally consistent with prior, patient discharged to follow-up as an outpatient. MDM Rules/Calculators/A&P  MDM Number of Diagnoses or Management Options Acute alcoholic intoxication with delirium (HCC): new, needed workup Fall, initial encounter: new, needed workup Laceration of scalp, initial encounter: new, needed workup Lower urinary tract infectious disease: new, needed workup SAH (subarachnoid hemorrhage) (HCC): new, needed workup   Amount and/or Complexity of Data Reviewed Clinical lab tests: reviewed Tests in  the radiology section of CPT: reviewed Tests in the medicine section of CPT: reviewed Discussion of test results with the performing providers: yes Decide to obtain previous medical records or to obtain history from someone other than the patient: yes Obtain history from someone other than the patient: yes Review and summarize past medical records: yes Discuss the patient with other providers:  yes Independent visualization of images, tracings, or specimens: yes  Risk of Complications, Morbidity, and/or Mortality Presenting problems: high Diagnostic procedures: high Management options: high  Critical Care Total time providing critical care: 30-74 minutes (35)  Patient Progress Patient progress: stable    Final Clinical Impression(s) / ED Diagnoses Final diagnoses:  Fall, initial encounter  Laceration of scalp, initial encounter  SAH (subarachnoid hemorrhage) (HCC)  Acute alcoholic intoxication with delirium (HCC)  Lower urinary tract infectious disease     Gerhard Munch, MD 04/29/20 2200

## 2020-04-30 NOTE — ED Notes (Signed)
Patient denies pain and is resting comfortably.  

## 2020-05-04 ENCOUNTER — Encounter: Payer: Self-pay | Admitting: *Deleted

## 2020-05-04 ENCOUNTER — Telehealth: Payer: Self-pay | Admitting: *Deleted

## 2020-05-04 NOTE — Congregational Nurse Program (Signed)
  Dept: [phone]   Congregational Nurse Program Note  Date of Encounter: 05/04/2020  Past Medical History: Past Medical History:  Diagnosis Date  . Diabetes mellitus without complication (HCC)   . Hepatitis C   . HIV infection (HCC)   . Schizophrenia (HCC)   . Substance abuse [facility])     Encounter Details:  CNP Questionnaire - 05/04/20 1439      Questionnaire   Do you give verbal consent to treat you today? Yes    Visit Setting Other   lobby [facility]   Location Patient Served At [facility]    Patient Status Homeless    Medical Provider Yes    Insurance Medicaid    Intervention Educate;Support;Advocate   pick up medication   Housing/Utilities No permanent housing    Transportation Need transportation assistance    Interpersonal Safety Do not feel physically and emotionally safe where you currently live    Food Have food insecurities    Medication Provided medication assistance (Pharmacies, drug rep, etc.)    Referrals Medication Assistance          Pt seen in the lobby of [facility] and asked about recent hospital visit as his name came up on the recent ED visits. Client reports he had a UTI was given a prescription and did not get it because he had no money. Called his pharmacy [facility] and asked if payment could be waived. They gave approval and writer went to pick up. The antiobotic medication was brought back to pt and gave to him at [facility]. Explained how to take medication and he acknowledged understanding. Per IRC SW, client's ACT team is suppose to see client tomorrow. Nira Conn. RN CN [phone]

## 2020-05-04 NOTE — Telephone Encounter (Signed)
Called [facility] pharmacy to see if client had any medication available and to request a waiver for payment.

## 2020-05-11 ENCOUNTER — Encounter: Payer: Self-pay | Admitting: *Deleted

## 2020-05-11 NOTE — Congregational Nurse Program (Signed)
  Dept: [phone]   Congregational Nurse Program Note  Date of Encounter: 05/11/2020  Past Medical History: Past Medical History:  Diagnosis Date  . Diabetes mellitus without complication (HCC)   . Hepatitis C   . HIV infection (HCC)   . Schizophrenia (HCC)   . Substance abuse [facility])     Encounter Details:  CNP Questionnaire - 05/11/20 1240      Questionnaire   Do you give verbal consent to treat you today? Yes    Visit Setting Other    Location Patient Served At [facility]    Patient Status Homeless    Medical Provider Yes    Insurance Medicaid    Intervention Support;Assess (including screenings)    Housing/Utilities No permanent housing    Medication Have medication insecurities    Referrals Other   N/A client seeing PATH team and ACT team adovacate         Client came to nurses office asking to have his blood sugar checked. CBG 176. While in office, PATH team leader requested to see client and pt advocate in building to see client. Nira Conn. RN CN [phone]

## 2020-05-19 ENCOUNTER — Ambulatory Visit: Payer: Medicaid Other | Attending: Internal Medicine

## 2020-05-19 DIAGNOSIS — Z23 Encounter for immunization: Secondary | ICD-10-CM

## 2020-05-19 NOTE — Progress Notes (Signed)
° °  Covid-19 Vaccination Clinic  Name:  Derrick Cooper    MRN: [id number] DOB: 03-28-1961  05/19/2020  [name] was observed post Covid-19 immunization for 15 minutes without incident. He was provided with Vaccine Information Sheet and instruction to access the V-Safe system.   [name] was instructed to call 911 with any severe reactions post vaccine:  Difficulty breathing   Swelling of face and throat   A fast heartbeat   A bad rash all over body   Dizziness and weakness   Immunizations Administered    Name Date Dose VIS Date Route   JANSSEN COVID-19 VACCINE 05/19/2020 10:01 AM 0.5 mL 09/28/2019 Intramuscular   Manufacturer: [employer]   Lot: [id number]   NDC: 09735-329-92

## 2020-06-29 ENCOUNTER — Encounter: Payer: Self-pay | Admitting: *Deleted

## 2020-06-29 NOTE — Congregational Nurse Program (Signed)
  Dept: [phone]   Congregational Nurse Program Note  Date of Encounter: 06/29/2020  Past Medical History: Past Medical History:  Diagnosis Date  . Diabetes mellitus without complication (HCC)   . Hepatitis C   . HIV infection (HCC)   . Schizophrenia (HCC)   . Substance abuse [facility])     Encounter Details:  CNP Questionnaire - 06/29/20 0944      Questionnaire   Do you give verbal consent to treat you today? Yes    Location Patient Served At [facility]    Patient Status Homeless   staying in motel [facility] providing   Medical Provider Yes    Insurance Medicaid    Intervention Support;Refer    Housing/Utilities No permanent housing    Transportation Need transportation assistance    Referrals Other   Path SW         Client seen in [facility] lobby and reports he does not have access to some of his medications. Client is followed by an ACT team.  Alerted Path SW that is in contact with pt's ACT team.  Nira Conn RN CN

## 2020-08-13 ENCOUNTER — Other Ambulatory Visit: Payer: Medicaid Other

## 2020-08-13 ENCOUNTER — Other Ambulatory Visit (HOSPITAL_COMMUNITY)
Admission: RE | Admit: 2020-08-13 | Discharge: 2020-08-13 | Disposition: A | Discharge status: Home / Self Care | Payer: Medicaid Other | Source: Ambulatory Visit | Attending: Family | Admitting: Family

## 2020-08-13 ENCOUNTER — Other Ambulatory Visit: Payer: Self-pay

## 2020-08-13 DIAGNOSIS — B20 Human immunodeficiency virus [HIV] disease: Secondary | ICD-10-CM

## 2020-08-13 DIAGNOSIS — Z113 Encounter for screening for infections with a predominantly sexual mode of transmission: Secondary | ICD-10-CM | POA: Insufficient documentation

## 2020-08-13 DIAGNOSIS — Z79899 Other long term (current) drug therapy: Secondary | ICD-10-CM

## 2020-08-14 LAB — T-HELPER CELL (CD4) - (RCID CLINIC ONLY)
CD4 % Helper T Cell: 11 % — ABNORMAL LOW (ref 33–65)
CD4 T Cell Abs: 189 /uL — ABNORMAL LOW (ref 400–1790)

## 2020-08-14 LAB — URINE CYTOLOGY ANCILLARY ONLY
Chlamydia: NEGATIVE
Comment: NEGATIVE
Comment: NORMAL
Neisseria Gonorrhea: NEGATIVE

## 2020-08-18 LAB — CBC WITH DIFFERENTIAL/PLATELET
Absolute Monocytes: 292 cells/uL (ref 200–950)
Basophils Absolute: 19 cells/uL (ref 0–200)
Basophils Relative: 0.5 %
Eosinophils Absolute: 81 cells/uL (ref 15–500)
Eosinophils Relative: 2.2 %
HCT: 40.3 % (ref 38.5–50.0)
Hemoglobin: 13.5 g/dL (ref 13.2–17.1)
Lymphs Abs: 2057 cells/uL (ref 850–3900)
MCH: 30.7 pg (ref 27.0–33.0)
MCHC: 33.5 g/dL (ref 32.0–36.0)
MCV: 91.6 fL (ref 80.0–100.0)
MPV: 11.3 fL (ref 7.5–12.5)
Monocytes Relative: 7.9 %
Neutro Abs: 1251 cells/uL — ABNORMAL LOW (ref 1500–7800)
Neutrophils Relative %: 33.8 %
Platelets: 168 10*3/uL (ref 140–400)
RBC: 4.4 10*6/uL (ref 4.20–5.80)
RDW: 11.8 % (ref 11.0–15.0)
Total Lymphocyte: 55.6 %
WBC: 3.7 10*3/uL — ABNORMAL LOW (ref 3.8–10.8)

## 2020-08-18 LAB — COMPLETE METABOLIC PANEL WITH GFR
AG Ratio: 0.7 (calc) — ABNORMAL LOW (ref 1.0–2.5)
ALT: 19 U/L (ref 9–46)
AST: 22 U/L (ref 10–35)
Albumin: 3.4 g/dL — ABNORMAL LOW (ref 3.6–5.1)
Alkaline phosphatase (APISO): 50 U/L (ref 35–144)
BUN: 13 mg/dL (ref 7–25)
CO2: 29 mmol/L (ref 20–32)
Calcium: 9 mg/dL (ref 8.6–10.3)
Chloride: 104 mmol/L (ref 98–110)
Creat: 0.94 mg/dL (ref 0.70–1.33)
GFR, Est African American: 104 mL/min/{1.73_m2} (ref >=60)
GFR, Est Non African American: 90 mL/min/{1.73_m2} (ref >=60)
Globulin: 5 g/dL (calc) — ABNORMAL HIGH (ref 1.9–3.7)
Glucose, Bld: 140 mg/dL — ABNORMAL HIGH (ref 65–99)
Potassium: 4.1 mmol/L (ref 3.5–5.3)
Sodium: 136 mmol/L (ref 135–146)
Total Bilirubin: 0.5 mg/dL (ref 0.2–1.2)
Total Protein: 8.4 g/dL — ABNORMAL HIGH (ref 6.1–8.1)

## 2020-08-18 LAB — LIPID PANEL
Cholesterol: 175 mg/dL (ref <200)
HDL: 45 mg/dL (ref >=40)
LDL Cholesterol (Calc): 108 mg/dL (calc) — ABNORMAL HIGH
Non-HDL Cholesterol (Calc): 130 mg/dL (calc) — ABNORMAL HIGH (ref <130)
Total CHOL/HDL Ratio: 3.9 (calc) (ref <5.0)
Triglycerides: 111 mg/dL (ref <150)

## 2020-08-18 LAB — HIV-1 RNA QUANT-NO REFLEX-BLD
HIV 1 RNA Quant: 71800 Copies/mL — ABNORMAL HIGH
HIV-1 RNA Quant, Log: 4.86 Log cps/mL — ABNORMAL HIGH

## 2020-08-18 LAB — FLUORESCENT TREPONEMAL AB(FTA)-IGG-BLD: Fluorescent Treponemal ABS: REACTIVE — AB

## 2020-08-18 LAB — RPR: RPR Ser Ql: REACTIVE — AB

## 2020-08-18 LAB — RPR TITER: RPR Titer: 1:1 {titer} — ABNORMAL HIGH

## 2020-08-27 ENCOUNTER — Encounter: Payer: Self-pay | Admitting: Family

## 2020-08-27 ENCOUNTER — Other Ambulatory Visit: Payer: Self-pay

## 2020-08-27 ENCOUNTER — Ambulatory Visit (INDEPENDENT_AMBULATORY_CARE_PROVIDER_SITE_OTHER): Payer: Medicaid Other | Admitting: Family

## 2020-08-27 VITALS — BP 84/58 | HR 114 | Temp 98.0°F | Wt 147.0 lb

## 2020-08-27 DIAGNOSIS — F1010 Alcohol abuse, uncomplicated: Secondary | ICD-10-CM | POA: Diagnosis not present

## 2020-08-27 DIAGNOSIS — Z0000 Encounter for general adult medical examination without abnormal findings: Secondary | ICD-10-CM

## 2020-08-27 DIAGNOSIS — B20 Human immunodeficiency virus [HIV] disease: Secondary | ICD-10-CM

## 2020-08-27 DIAGNOSIS — F1910 Other psychoactive substance abuse, uncomplicated: Secondary | ICD-10-CM | POA: Diagnosis not present

## 2020-08-27 MED ORDER — BICTEGRAVIR-EMTRICITAB-TENOFOV 50-200-25 MG PO TABS: 1.0000 | ORAL_TABLET | Freq: Every day | ORAL | 0 refills | Status: DC

## 2020-08-27 MED ORDER — SULFAMETHOXAZOLE-TRIMETHOPRIM 800-160 MG PO TABS: 1.0000 | ORAL_TABLET | Freq: Every day | ORAL | 3 refills | Status: DC

## 2020-08-27 NOTE — Patient Instructions (Addendum)
Nice to see you.  We will get you restarted on Biktarvy and also Bactrim for opportunistic infection protection.  We are here to help wherever we can. Congratulations on you 2 months of sobriety.   Recommend getting your COVID booster with either Pfizer or Moderna. This can be done at any pharmacy.   Plan for follow up in 1 month or sooner if needed with lab work on the on the same day.  Have a great day and stay safe!

## 2020-08-27 NOTE — Assessment & Plan Note (Signed)
Derrick Cooper had a relapse and alcoholism and is now 2 months sober and maintained in a program.  Congratulated on this accomplishment.  Discussed importance of continued sobriety to maintain his ability to take his HIV medications prevent further complications in the future.

## 2020-08-27 NOTE — Progress Notes (Signed)
Subjective:    Patient ID: Derrick Cooper, male    DOB: 1961/07/25, 60 y.o.   MRN: [id number]  Chief Complaint  Patient presents with  . Follow-up    B20     HPI:  Derrick Cooper is a 60 y.o. male with HIV disease last seen on 06/24/19 with less than optimal adherence to his ART regimen of BIktarvy. Viral load at the time was undetectable and since that time has had viral load ranging from [phone] with most recent lab work on 08/13/20 at 71,800. CD4 count of 189.  Here today for routine follow-up.  Derrick Cooper has been off medication for approximately 3 to 4 months as he relapsed into drinking.  He has now gotten his drinking better controlled and has been clean for 2 months and then a program to help him.  Eager to get back into treatment. Denies fevers, chills, night sweats, headaches, changes in vision, neck pain/stiffness, nausea, diarrhea, vomiting, lesions or rashes.  Derrick Cooper has no problems obtaining medication remains covered through [facility].  Denies feelings of being down, depressed, or hopeless recently.  He has been sober from alcohol for 2 months now.  He has smoked marijuana recently done crack about 1 month ago.  Current every day smoker 1/2 pack of cigarettes per day.  Declines condoms.  Due for booster dose of Covid vaccine.   No Known Allergies    Outpatient Medications Prior to Visit  Medication Sig Dispense Refill  . atorvastatin (LIPITOR) 40 MG tablet Take 40 mg by mouth daily.     Marland Kitchen lisinopril (ZESTRIL) 5 MG tablet Take 5 mg by mouth daily.    . metFORMIN (GLUCOPHAGE) 500 MG tablet Take 1 tablet (500 mg total) by mouth 2 (two) times daily with a meal. 60 tablet 3  . QUEtiapine (SEROQUEL) 200 MG tablet Take 1 tablet (200 mg total) by mouth at bedtime. 30 tablet 2  . bictegravir-emtricitabine-tenofovir AF (BIKTARVY) 50-200-25 MG TABS tablet Take 1 tablet by mouth daily. 30 tablet 0  . HYDROcodone-acetaminophen (NORCO/VICODIN) 5-325 MG tablet Take 1-2 tablets by  mouth every 4 (four) hours as needed for moderate pain. (Patient not taking: Reported on 08/27/2020) 30 tablet 0   No facility-administered medications prior to visit.     Past Medical History:  Diagnosis Date  . Diabetes mellitus without complication (HCC)   . Hepatitis C   . HIV infection (HCC)   . Schizophrenia (HCC)   . Substance abuse [facility])      Past Surgical History:  Procedure Laterality Date  . ORIF MANDIBULAR FRACTURE Left 12/31/2019   Procedure: OPEN REDUCTION INTERNAL FIXATION (ORIF) MANDIBULAR FRACTURE , NASAL  MAXILLO MANDIBULAR FIXATION;  Surgeon: [name], MD;  Location: [facility] OR;  Service: ENT;  Laterality: Left;       Review of Systems  Constitutional: Negative for appetite change, chills, fatigue, fever and unexpected weight change.  Eyes: Negative for visual disturbance.  Respiratory: Negative for cough, chest tightness, shortness of breath and wheezing.   Cardiovascular: Negative for chest pain and leg swelling.  Gastrointestinal: Negative for abdominal pain, constipation, diarrhea, nausea and vomiting.  Genitourinary: Negative for dysuria, flank pain, frequency, genital sores, hematuria and urgency.  Skin: Negative for rash.  Allergic/Immunologic: Negative for immunocompromised state.  Neurological: Negative for dizziness and headaches.      Objective:    BP (!) 84/58   Pulse (!) 114   Temp 98 F (36.7 C) (Oral)   Wt 147 lb (  66.7 kg)   BMI 18.87 kg/m  Nursing note and vital signs reviewed.  Physical Exam Constitutional:      General: He is not in acute distress.    Appearance: He is well-developed.  HENT:     Mouth/Throat:     Mouth: Oropharynx is clear and moist.  Eyes:     Conjunctiva/sclera: Conjunctivae normal.  Cardiovascular:     Rate and Rhythm: Normal rate and regular rhythm.     Pulses: Intact distal pulses.     Heart sounds: Normal heart sounds. No murmur heard. No friction rub. No gallop.   Pulmonary:     Effort:  Pulmonary effort is normal. No respiratory distress.     Breath sounds: Normal breath sounds. No wheezing or rales.  Chest:     Chest wall: No tenderness.  Abdominal:     General: Bowel sounds are normal.     Palpations: Abdomen is soft.     Tenderness: There is no abdominal tenderness.  Musculoskeletal:     Cervical back: Neck supple.  Lymphadenopathy:     Cervical: No cervical adenopathy.  Skin:    General: Skin is warm and dry.     Findings: No rash.  Neurological:     Mental Status: He is alert and oriented to person, place, and time.  Psychiatric:        Mood and Affect: Mood and affect normal.        Behavior: Behavior normal.        Thought Content: Thought content normal.        Judgment: Judgment normal.      Depression screen [facility] 2/9 08/27/2020 06/24/2019 04/23/2019 04/23/2019 03/31/2019  Decreased Interest 0 0 0 0 0  Down, Depressed, Hopeless 0 0 0 0 0  PHQ - 2 Score 0 0 0 0 0  Altered sleeping - - 0 0 0  Tired, decreased energy - - 0 0 0  Change in appetite - - 0 0 0  Feeling bad or failure about yourself  - - 0 0 0  Trouble concentrating - - 0 0 0  Moving slowly or fidgety/restless - - 0 0 0  Suicidal thoughts - - 0 0 0  PHQ-9 Score - - 0 0 0  Some recent data might be hidden       Assessment & Plan:    Patient Active Problem List   Diagnosis Date Noted  . Alcohol abuse   . Fracture of mandible (HCC)   . Acute respiratory failure with hypoxia (HCC) 12/27/2019  . Lobar pneumonia (HCC) 12/27/2019  . Community acquired pneumonia of right lung 12/27/2019  . Polysubstance abuse (HCC) 12/10/2019  . Homelessness 12/10/2019  . Substance induced mood disorder (HCC) 12/10/2019  . MDD (major depressive disorder), recurrent episode, severe (HCC) 08/09/2018  . Schizoaffective disorder, bipolar type (HCC)   . Health care maintenance 06/26/2018  . Tobacco use 06/26/2018  . Furuncle of right axilla 11/13/2017  . Hyperlipidemia associated with type 2 diabetes  mellitus (HCC) 10/25/2017  . 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 HIV infection (HCC) 09/27/2017  . Chronic hepatitis C without hepatic coma (HCC) 09/27/2017  . Acute non-recurrent maxillary sinusitis 09/27/2017  . Schizophrenia (HCC) 09/07/2017  . Controlled diabetes mellitus with complication, without long-term current use of insulin (HCC) 09/07/2017  . Urinary incontinence 09/07/2017     Problem List Items Addressed This Visit  Other   HIV infection (HCC) - Primary    Mr. Kras has poorly controlled HIV/AIDS having been off medication for approximately 3 to 4 months due to relapse and alcoholism.  Fortunately he has no signs/symptoms of opportunistic infection or progressive HIV disease.  We reviewed previous lab work and discussed the plan of care.  Restart Biktarvy.  Will need to supplement with Bactrim for OI prophylaxis given CD4 count less than 200.  Plan for follow-up in 1 month or sooner if needed.      Relevant Medications   bictegravir-emtricitabine-tenofovir AF (BIKTARVY) 50-200-25 MG TABS tablet   sulfamethoxazole-trimethoprim (BACTRIM DS) 800-160 MG tablet   Health care maintenance     Discussed importance of safe sexual practice to reduce risk of STI.  Condoms declined.  Encouraged/discussed booster dose of Covid vaccine which she will schedule independently.  Influenza vaccine up-to-date per recommendations.      Polysubstance abuse [facility])    Mr. Kaminsky had a relapse of crack over the past month in addition to his alcoholism.  Currently in a better place and not using.  Discussed importance of counseling and he is attending a program currently.  Continue to monitor.      Alcohol abuse    Mr. Yono had a relapse and alcoholism and is now 2 months sober and maintained in a program.  Congratulated on this accomplishment.  Discussed importance of continued sobriety to  maintain his ability to take his HIV medications prevent further complications in the future.          I am having Raijon L. Collingsworth start on sulfamethoxazole-trimethoprim. I am also having him maintain his atorvastatin, QUEtiapine, metFORMIN, HYDROcodone-acetaminophen, lisinopril, and bictegravir-emtricitabine-tenofovir AF.   Meds ordered this encounter  Medications  . bictegravir-emtricitabine-tenofovir AF (BIKTARVY) 50-200-25 MG TABS tablet    Sig: Take 1 tablet by mouth daily.    Dispense:  30 tablet    Refill:  0    Order Specific Question:   Supervising Provider    Answer:   Judyann Munson [[id number]]  . sulfamethoxazole-trimethoprim (BACTRIM DS) 800-160 MG tablet    Sig: Take 1 tablet by mouth daily.    Dispense:  30 tablet    Refill:  3    Order Specific Question:   Supervising Provider    Answer:   Judyann Munson [[id number]]     Follow-up: Return in about 1 month (around 09/27/2020), or if symptoms worsen or fail to improve.   Marcos Eke, MSN, FNP-C Nurse Practitioner [facility] for Infectious Disease [facility] Main number: [phone]

## 2020-08-27 NOTE — Assessment & Plan Note (Signed)
Derrick Cooper has poorly controlled HIV/AIDS having been off medication for approximately 3 to 4 months due to relapse and alcoholism.  Fortunately he has no signs/symptoms of opportunistic infection or progressive HIV disease.  We reviewed previous lab work and discussed the plan of care.  Restart Biktarvy.  Will need to supplement with Bactrim for OI prophylaxis given CD4 count less than 200.  Plan for follow-up in 1 month or sooner if needed.

## 2020-08-27 NOTE — Assessment & Plan Note (Signed)
   Discussed importance of safe sexual practice to reduce risk of STI.  Condoms declined.  Encouraged/discussed booster dose of Covid vaccine which she will schedule independently.  Influenza vaccine up-to-date per recommendations.

## 2020-08-27 NOTE — Assessment & Plan Note (Signed)
Derrick Cooper had a relapse of crack over the past month in addition to his alcoholism.  Currently in a better place and not using.  Discussed importance of counseling and he is attending a program currently.  Continue to monitor.

## 2020-09-06 ENCOUNTER — Emergency Department (HOSPITAL_COMMUNITY)
Admission: EM | Admit: 2020-09-06 | Discharge: 2020-09-09 | Disposition: A | Discharge status: Home / Self Care | Payer: Medicaid Other | Attending: Emergency Medicine | Admitting: Emergency Medicine

## 2020-09-06 ENCOUNTER — Other Ambulatory Visit: Payer: Self-pay

## 2020-09-06 ENCOUNTER — Encounter (HOSPITAL_COMMUNITY): Payer: Self-pay | Admitting: Emergency Medicine

## 2020-09-06 DIAGNOSIS — Z21 Asymptomatic human immunodeficiency virus [HIV] infection status: Secondary | ICD-10-CM | POA: Insufficient documentation

## 2020-09-06 DIAGNOSIS — H579 Unspecified disorder of eye and adnexa: Secondary | ICD-10-CM | POA: Insufficient documentation

## 2020-09-06 DIAGNOSIS — F1420 Cocaine dependence, uncomplicated: Secondary | ICD-10-CM | POA: Insufficient documentation

## 2020-09-06 DIAGNOSIS — Z7984 Long term (current) use of oral hypoglycemic drugs: Secondary | ICD-10-CM | POA: Insufficient documentation

## 2020-09-06 DIAGNOSIS — Y92410 Unspecified street and highway as the place of occurrence of the external cause: Secondary | ICD-10-CM | POA: Diagnosis not present

## 2020-09-06 DIAGNOSIS — T1491XA Suicide attempt, initial encounter: Secondary | ICD-10-CM

## 2020-09-06 DIAGNOSIS — Y92009 Unspecified place in unspecified non-institutional (private) residence as the place of occurrence of the external cause: Secondary | ICD-10-CM | POA: Diagnosis not present

## 2020-09-06 DIAGNOSIS — Z79899 Other long term (current) drug therapy: Secondary | ICD-10-CM | POA: Diagnosis not present

## 2020-09-06 DIAGNOSIS — E1169 Type 2 diabetes mellitus with other specified complication: Secondary | ICD-10-CM | POA: Diagnosis not present

## 2020-09-06 DIAGNOSIS — F1020 Alcohol dependence, uncomplicated: Secondary | ICD-10-CM | POA: Insufficient documentation

## 2020-09-06 DIAGNOSIS — E785 Hyperlipidemia, unspecified: Secondary | ICD-10-CM | POA: Insufficient documentation

## 2020-09-06 DIAGNOSIS — Z20822 Contact with and (suspected) exposure to covid-19: Secondary | ICD-10-CM | POA: Diagnosis not present

## 2020-09-06 DIAGNOSIS — F1910 Other psychoactive substance abuse, uncomplicated: Secondary | ICD-10-CM | POA: Diagnosis present

## 2020-09-06 DIAGNOSIS — E11610 Type 2 diabetes mellitus with diabetic neuropathic arthropathy: Secondary | ICD-10-CM | POA: Insufficient documentation

## 2020-09-06 DIAGNOSIS — F209 Schizophrenia, unspecified: Secondary | ICD-10-CM | POA: Insufficient documentation

## 2020-09-06 DIAGNOSIS — F17210 Nicotine dependence, cigarettes, uncomplicated: Secondary | ICD-10-CM | POA: Insufficient documentation

## 2020-09-06 DIAGNOSIS — F200 Paranoid schizophrenia: Secondary | ICD-10-CM | POA: Diagnosis not present

## 2020-09-06 LAB — CBC
HCT: 39.7 % (ref 39.0–52.0)
Hemoglobin: 13.7 g/dL (ref 13.0–17.0)
MCH: 30.5 pg (ref 26.0–34.0)
MCHC: 34.5 g/dL (ref 30.0–36.0)
MCV: 88.4 fL (ref 80.0–100.0)
Platelets: 166 10*3/uL (ref 150–400)
RBC: 4.49 MIL/uL (ref 4.22–5.81)
RDW: 12.8 % (ref 11.5–15.5)
WBC: 6.7 10*3/uL (ref 4.0–10.5)
nRBC: 0 % (ref 0.0–0.2)

## 2020-09-06 LAB — COMPREHENSIVE METABOLIC PANEL
ALT: 32 U/L (ref 0–44)
AST: 54 U/L — ABNORMAL HIGH (ref 15–41)
Albumin: 3.4 g/dL — ABNORMAL LOW (ref 3.5–5.0)
Alkaline Phosphatase: 53 U/L (ref 38–126)
Anion gap: 11 (ref 5–15)
BUN: 5 mg/dL — ABNORMAL LOW (ref 6–20)
CO2: 24 mmol/L (ref 22–32)
Calcium: 8.6 mg/dL — ABNORMAL LOW (ref 8.9–10.3)
Chloride: 104 mmol/L (ref 98–111)
Creatinine, Ser: 0.76 mg/dL (ref 0.61–1.24)
GFR, Estimated: >60 mL/min (ref >60)
Glucose, Bld: 96 mg/dL (ref 70–99)
Potassium: 4.2 mmol/L (ref 3.5–5.1)
Sodium: 139 mmol/L (ref 135–145)
Total Bilirubin: 0.7 mg/dL (ref 0.3–1.2)
Total Protein: 9 g/dL — ABNORMAL HIGH (ref 6.5–8.1)

## 2020-09-06 LAB — ETHANOL: Alcohol, Ethyl (B): 309 mg/dL (ref <10)

## 2020-09-06 LAB — ACETAMINOPHEN LEVEL: Acetaminophen (Tylenol), Serum: <10 ug/mL — ABNORMAL LOW (ref 10–30)

## 2020-09-06 LAB — SALICYLATE LEVEL: Salicylate Lvl: <7 mg/dL — ABNORMAL LOW (ref 7.0–30.0)

## 2020-09-06 NOTE — ED Triage Notes (Addendum)
Pt to triage via [employer].  They state they do not have IVC papers on pt and brought him here under "emergency".  States they found pt walking on [employer] [address] and states he is suicidal with a plan to go to his hotel room and take a "fist full of pills."  Pt having outburst of yelling profanity and stating there are vampires here trying to get him and he is having conversations with the vampires per [employer].  Pt is uncooperative and is handcuffed by [employer].

## 2020-09-07 LAB — RESP PANEL BY RT-PCR (FLU A&B, COVID) ARPGX2
Influenza A by PCR: NEGATIVE
Influenza B by PCR: NEGATIVE
SARS Coronavirus 2 by RT PCR: NEGATIVE

## 2020-09-07 LAB — CBG MONITORING, ED
Glucose-Capillary: 61 mg/dL — ABNORMAL LOW (ref 70–99)
Glucose-Capillary: 176 mg/dL — ABNORMAL HIGH (ref 70–99)

## 2020-09-07 MED ORDER — THIAMINE HCL 100 MG/ML IJ SOLN: 100.0000 mg | Freq: Every day | INTRAMUSCULAR | Status: DC

## 2020-09-07 MED ORDER — LORAZEPAM 1 MG PO TABS: 0.0000 mg | ORAL_TABLET | Freq: Two times a day (BID) | ORAL | Status: DC

## 2020-09-07 MED ORDER — LORAZEPAM 2 MG/ML IJ SOLN: 0.0000 mg | Freq: Two times a day (BID) | INTRAMUSCULAR | Status: DC

## 2020-09-07 MED ORDER — LORAZEPAM 2 MG/ML IJ SOLN: 0.0000 mg | Freq: Four times a day (QID) | INTRAMUSCULAR | Status: AC

## 2020-09-07 MED ORDER — LORAZEPAM 1 MG PO TABS
0.0000 mg | ORAL_TABLET | Freq: Four times a day (QID) | ORAL | Status: AC
  Administered 2020-09-07: 2 mg via ORAL
  Administered 2020-09-07 – 2020-09-08 (×2): 1 mg via ORAL
  Administered 2020-09-08: 2 mg via ORAL
  Filled 2020-09-07: qty 1
  Filled 2020-09-07 (×2): qty 2
  Filled 2020-09-07: qty 1

## 2020-09-07 MED ORDER — THIAMINE HCL 100 MG PO TABS
100.0000 mg | ORAL_TABLET | Freq: Every day | ORAL | Status: DC
  Administered 2020-09-07 – 2020-09-09 (×3): 100 mg via ORAL
  Filled 2020-09-07 (×3): qty 1

## 2020-09-07 NOTE — ED Provider Notes (Signed)
[facility] EMERGENCY DEPARTMENT Provider Note   CSN: [id number] Arrival date & time: 09/06/20  1725     History Chief Complaint  Patient presents with  . Suicidal    Derrick Cooper is a 60 y.o. male.  Patient with past medical history notable for schizophrenia, HIV, hepatitis C, and diabetes was brought to the emergency department by [employer] after he was found attempting to jump out into traffic.  Patient tells [employer] that he is suicidal and will take a "fist full of pills" or slit his wrists.  He has reportedly been having outbursts of yelling profanity and stating that there are vampires trying to get him.  He reports associated alcohol use, but denies drug use.  Denies any recent illnesses.  The history is provided by the patient. No language interpreter was used.       Past Medical History:  Diagnosis Date  . Diabetes mellitus without complication (HCC)   . Hepatitis C   . HIV infection (HCC)   . Schizophrenia (HCC)   . Substance abuse [facility])     Patient Active Problem List   Diagnosis Date Noted  . Alcohol abuse   . Fracture of mandible (HCC)   . Acute respiratory failure with hypoxia (HCC) 12/27/2019  . Lobar pneumonia (HCC) 12/27/2019  . Community acquired pneumonia of right lung 12/27/2019  . Polysubstance abuse (HCC) 12/10/2019  . Homelessness 12/10/2019  . Substance induced mood disorder (HCC) 12/10/2019  . MDD (major depressive disorder), recurrent episode, severe (HCC) 08/09/2018  . Schizoaffective disorder, bipolar type (HCC)   . Health care maintenance 06/26/2018  . Tobacco use 06/26/2018  . Furuncle of right axilla 11/13/2017  . Hyperlipidemia associated with type 2 diabetes mellitus (HCC) 10/25/2017  . 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 HIV infection (HCC) 09/27/2017  . Chronic hepatitis C without hepatic coma (HCC) 09/27/2017  . Acute  non-recurrent maxillary sinusitis 09/27/2017  . Schizophrenia (HCC) 09/07/2017  . Controlled diabetes mellitus with complication, without long-term current use of insulin (HCC) 09/07/2017  . Urinary incontinence 09/07/2017    Past Surgical History:  Procedure Laterality Date  . ORIF MANDIBULAR FRACTURE Left 12/31/2019   Procedure: OPEN REDUCTION INTERNAL FIXATION (ORIF) MANDIBULAR FRACTURE , NASAL  MAXILLO MANDIBULAR FIXATION;  Surgeon: [name], MD;  Location: [facility] OR;  Service: ENT;  Laterality: Left;       Family History  Problem Relation Age of Onset  . Breast cancer Mother   . Lung cancer Father   . Heart attack Father     Social History   Tobacco Use  . Smoking status: Current Every Day Smoker    Packs/day: 0.50    Years: 42.00    Pack years: 21.00    Types: Cigarettes  . Smokeless tobacco: Never Used  Vaping Use  . Vaping Use: Never used  Substance Use Topics  . Alcohol use: Yes    Comment: Clean for 2 months   . Drug use: Yes    Types: Marijuana, Methamphetamines, "Crack" cocaine    Comment: Has used crack in the last month; marajuana  occasionally    Home Medications Prior to Admission medications   Medication Sig Start Date End Date Taking? Authorizing Provider  atorvastatin (LIPITOR) 40 MG tablet Take 40 mg by mouth daily.  06/04/19   [provider]  bictegravir-emtricitabine-tenofovir AF (BIKTARVY) 50-200-25 MG TABS tablet Take 1 tablet by mouth daily.  08/27/20   Veryl Speak, FNP  HYDROcodone-acetaminophen (NORCO/VICODIN) 5-325 MG tablet Take 1-2 tablets by mouth every 4 (four) hours as needed for moderate pain. Patient not taking: Reported on 08/27/2020 01/03/20   Marinda Elk, MD  hydrOXYzine (VISTARIL) 50 MG capsule Take 50 mg by mouth at bedtime. 06/17/20   [provider]  lisinopril (ZESTRIL) 5 MG tablet Take 5 mg by mouth daily. 01/20/20   [provider]  metFORMIN (GLUCOPHAGE) 500 MG tablet Take 1 tablet  (500 mg total) by mouth 2 (two) times daily with a meal. 10/28/19   Malvin Johns, MD  QUEtiapine (SEROQUEL) 200 MG tablet Take 1 tablet (200 mg total) by mouth at bedtime. 10/28/19   Malvin Johns, MD  sulfamethoxazole-trimethoprim (BACTRIM DS) 800-160 MG tablet Take 1 tablet by mouth daily. 08/27/20   Veryl Speak, FNP    Allergies    Patient has no known allergies.  Review of Systems   Review of Systems  All other systems reviewed and are negative.   Physical Exam Updated Vital Signs BP (!) 99/40 (BP Location: Right Arm)   Pulse (!) 106   Temp 98.5 F (36.9 C)   Resp 18   SpO2 96%   Physical Exam Vitals and nursing note reviewed.  Constitutional:      Appearance: He is well-developed and well-nourished.  HENT:     Head: Normocephalic and atraumatic.  Eyes:     Conjunctiva/sclera: Conjunctivae normal.     Comments: Eye patch over the left eye  Cardiovascular:     Rate and Rhythm: Normal rate and regular rhythm.     Heart sounds: No murmur heard.   Pulmonary:     Effort: Pulmonary effort is normal. No respiratory distress.     Breath sounds: Normal breath sounds.  Abdominal:     Palpations: Abdomen is soft.     Tenderness: There is no abdominal tenderness.  Musculoskeletal:        General: No edema.     Cervical back: Neck supple.  Skin:    General: Skin is warm and dry.  Neurological:     Mental Status: He is alert and oriented to person, place, and time.  Psychiatric:        Mood and Affect: Mood and affect normal.     Comments: Labile     ED Results / Procedures / Treatments   Labs (all labs ordered are listed, but only abnormal results are displayed) Labs Reviewed  COMPREHENSIVE METABOLIC PANEL - Abnormal; Notable for the following components:      Result Value   BUN 5 (*)    Calcium 8.6 (*)    Total Protein 9.0 (*)    Albumin 3.4 (*)    AST 54 (*)    All other components within normal limits  ETHANOL - Abnormal; Notable for the following  components:   Alcohol, Ethyl (B) 309 (*)    All other components within normal limits  SALICYLATE LEVEL - Abnormal; Notable for the following components:   Salicylate Lvl <7.0 (*)    All other components within normal limits  ACETAMINOPHEN LEVEL - Abnormal; Notable for the following components:   Acetaminophen (Tylenol), Serum <10 (*)    All other components within normal limits  CBG MONITORING, ED - Abnormal; Notable for the following components:   Glucose-Capillary 61 (*)    All other components within normal limits  RESP PANEL BY RT-PCR (FLU A&B, COVID) ARPGX2  CBC  RAPID URINE DRUG  SCREEN, HOSP PERFORMED    EKG None  Radiology No results found.  Procedures Procedures   Medications Ordered in ED Medications  LORazepam (ATIVAN) injection 0-4 mg (has no administration in time range)    Or  LORazepam (ATIVAN) tablet 0-4 mg (has no administration in time range)  LORazepam (ATIVAN) injection 0-4 mg (has no administration in time range)    Or  LORazepam (ATIVAN) tablet 0-4 mg (has no administration in time range)  thiamine tablet 100 mg (has no administration in time range)    Or  thiamine (B-1) injection 100 mg (has no administration in time range)    ED Course  I have reviewed the triage vital signs and the nursing notes.  Pertinent labs & imaging results that were available during my care of the patient were reviewed by me and considered in my medical decision making (see chart for details).    MDM Rules/Calculators/A&P                          Patient her after trying to jump out in front of traffic.  He also states that he would try and kill himself by overdose or slitting his wrists.  IVC papers filed.  Ethanol was 309 at 1925 yesterday.  CIWA ordered to prevent withdrawal.  Appears medically stable for TTS evaluation.  4:07 AM RN reports repeat CBG 176 after eating some food.   Patient meets criteria for inpatient treatment.  [facility] is at capacity.  TTS  looking for beds elsewhere.  Final Clinical Impression(s) / ED Diagnoses Final diagnoses:  Suicide attempt [facility])    Rx / DC Orders ED Discharge Orders    None       Roxy Horseman, PA-C 09/07/20 0435    Gilda Crease, MD 09/07/20 [phone]

## 2020-09-07 NOTE — ED Notes (Signed)
Patient refusing vitals at this time. Triage RN aware
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
## 2020-09-07 NOTE — ED Notes (Signed)
Patient received breakfast tray 

## 2020-09-07 NOTE — ED Notes (Addendum)
Pt IVC'd. Order placed for 1:1 sitter. Officers left pt bedside. Pt attempted to run earlier per shift report.Charge nurse aware. Pt allowed this nurse to get Covid swab. Pt being calm and appears to be sleeping.

## 2020-09-07 NOTE — ED Notes (Signed)
ETOH 300 Breakfast order placed

## 2020-09-07 NOTE — BH Assessment (Signed)
Received TTS consult order. Pt is currently in hallway. TTS will be completed when Pt is in a private area.   Pamalee Leyden, [facility], [facility] Triage Specialist [phone]

## 2020-09-07 NOTE — ED Notes (Signed)
[place] sandwich provided

## 2020-09-08 LAB — CBG MONITORING, ED: Glucose-Capillary: 73 mg/dL (ref 70–99)

## 2020-09-08 MED ORDER — METFORMIN HCL 500 MG PO TABS
500.0000 mg | ORAL_TABLET | Freq: Two times a day (BID) | ORAL | Status: DC
  Administered 2020-09-08 – 2020-09-09 (×2): 500 mg via ORAL
  Filled 2020-09-08 (×2): qty 1

## 2020-09-08 MED ORDER — BICTEGRAVIR-EMTRICITAB-TENOFOV 50-200-25 MG PO TABS
1.0000 | ORAL_TABLET | Freq: Every day | ORAL | Status: DC
  Administered 2020-09-08 – 2020-09-09 (×2): 1 via ORAL
  Filled 2020-09-08 (×2): qty 1

## 2020-09-08 MED ORDER — QUETIAPINE FUMARATE 200 MG PO TABS
200.0000 mg | ORAL_TABLET | Freq: Every day | ORAL | Status: DC
  Administered 2020-09-08: 200 mg via ORAL
  Filled 2020-09-08: qty 1

## 2020-09-08 MED ORDER — ATORVASTATIN CALCIUM 40 MG PO TABS
40.0000 mg | ORAL_TABLET | Freq: Every day | ORAL | Status: DC
  Administered 2020-09-08 – 2020-09-09 (×2): 40 mg via ORAL
  Filled 2020-09-08 (×2): qty 1

## 2020-09-08 MED ORDER — HYDROXYZINE HCL 50 MG PO TABS
50.0000 mg | ORAL_TABLET | Freq: Every day | ORAL | Status: DC
  Administered 2020-09-08: 50 mg via ORAL
  Filled 2020-09-08: qty 1

## 2020-09-08 MED ORDER — LISINOPRIL 2.5 MG PO TABS
5.0000 mg | ORAL_TABLET | Freq: Every day | ORAL | Status: DC
  Administered 2020-09-08: 5 mg via ORAL
  Filled 2020-09-08 (×2): qty 2
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
## 2020-09-08 NOTE — ED Provider Notes (Signed)
Emergency Medicine Observation Re-evaluation Note  Derrick Cooper is a 60 y.o. male, seen on rounds today.  Pt initially presented to the ED for complaints of Suicidal Currently, the patient is lying in his bed with the blanket pulled over his head.  Physical Exam  BP (!) 135/99   Pulse (!) 111   Temp 98.2 F (36.8 C) (Oral)   Resp 18   SpO2 97%  Physical Exam General: NAD Cardiac: normal rate Lungs: normal work of breathing Psych: calme  ED Course / MDM  EKG:    I have reviewed the labs performed to date as well as medications administered while in observation.  Recent changes in the last 24 hours include none.  Plan  Current plan is for admission for inpatient psychiatric treatment. Patient is under full IVC at this time.   [name] 09/08/20 1904    [name], MD 09/21/20 1755

## 2020-09-08 NOTE — ED Notes (Signed)
Pt provided with new set of burgandy scrubs along with toiletries to shower. Pt now showering without issue.

## 2020-09-08 NOTE — Progress Notes (Signed)
Pt meets inpatient criteria per Melbourne Abts, PA-C. Referral information has been sent to the following hospitals for review:  [facility]  [facility]  [facility]  [facility]  [facility]  [facility]  [facility]     Disposition will continue to assist with inpatient placement needs.    Wells Guiles, MSW, LCSW, LCAS Clinical Social Worker II Disposition CSW [phone]

## 2020-09-08 NOTE — ED Notes (Signed)
Dinner Tray Ordered @ 1649. 

## 2020-09-08 NOTE — ED Notes (Signed)
Lunch Tray Ordered @ 1036. 

## 2020-09-08 NOTE — ED Notes (Signed)
Breakfast Ordered 

## 2020-09-08 NOTE — [facility] Counselor (Signed)
This counselor provided a reassessment for pt. Pt was calm and cooperative throughout the interview. Pt denies SI, HI, and AV/H. Pt expressed a readiness to discharge due to not wanting to get kicked out of his residence at the hotel. Pt also reports that all of his medications are there.   Pt to be staffed with NP for updated disposition.

## 2020-09-09 DIAGNOSIS — F200 Paranoid schizophrenia: Secondary | ICD-10-CM

## 2020-09-09 LAB — BASIC METABOLIC PANEL
Anion gap: 10 (ref 5–15)
BUN: 14 mg/dL (ref 6–20)
CO2: 27 mmol/L (ref 22–32)
Calcium: 9.3 mg/dL (ref 8.9–10.3)
Chloride: 97 mmol/L — ABNORMAL LOW (ref 98–111)
Creatinine, Ser: 1.74 mg/dL — ABNORMAL HIGH (ref 0.61–1.24)
GFR, Estimated: 45 mL/min — ABNORMAL LOW (ref >60)
Glucose, Bld: 109 mg/dL — ABNORMAL HIGH (ref 70–99)
Potassium: 3.3 mmol/L — ABNORMAL LOW (ref 3.5–5.1)
Sodium: 134 mmol/L — ABNORMAL LOW (ref 135–145)

## 2020-09-09 LAB — CBC WITH DIFFERENTIAL/PLATELET
Abs Immature Granulocytes: 0.03 10*3/uL (ref 0.00–0.07)
Basophils Absolute: 0 10*3/uL (ref 0.0–0.1)
Basophils Relative: 1 %
Eosinophils Absolute: 0.1 10*3/uL (ref 0.0–0.5)
Eosinophils Relative: 1 %
HCT: 40.1 % (ref 39.0–52.0)
Hemoglobin: 13.8 g/dL (ref 13.0–17.0)
Immature Granulocytes: 1 %
Lymphocytes Relative: 33 %
Lymphs Abs: 1.8 10*3/uL (ref 0.7–4.0)
MCH: 30.3 pg (ref 26.0–34.0)
MCHC: 34.4 g/dL (ref 30.0–36.0)
MCV: 88.1 fL (ref 80.0–100.0)
Monocytes Absolute: 0.3 10*3/uL (ref 0.1–1.0)
Monocytes Relative: 6 %
Neutro Abs: 3.3 10*3/uL (ref 1.7–7.7)
Neutrophils Relative %: 58 %
Platelets: 142 10*3/uL — ABNORMAL LOW (ref 150–400)
RBC: 4.55 MIL/uL (ref 4.22–5.81)
RDW: 12.4 % (ref 11.5–15.5)
WBC: 5.5 10*3/uL (ref 4.0–10.5)
nRBC: 0 % (ref 0.0–0.2)

## 2020-09-09 LAB — CBG MONITORING, ED: Glucose-Capillary: 110 mg/dL — ABNORMAL HIGH (ref 70–99)

## 2020-09-09 LAB — LACTIC ACID, PLASMA: Lactic Acid, Venous: 1.1 mmol/L (ref 0.5–1.9)

## 2020-09-09 MED ORDER — LACTATED RINGERS IV BOLUS
1000.0000 mL | Freq: Once | INTRAVENOUS | Status: AC
  Administered 2020-09-09: 1000 mL via INTRAVENOUS

## 2020-09-09 NOTE — [facility] Counselor (Signed)
Writer spoke with the patient to complete an updated/reassessment. Patient denies SI/HI and AV/H.  Upon interview, pt presented with a sullen mood and anxious affect evidenced by him shaking. Pt expressed a desire to discharge.  [facility] provider to reevaluate pt for appropriateness for discharge.

## 2020-09-09 NOTE — ED Notes (Signed)
Patient has low morning BPs and c/o dizziness; RN noted that patient is slightly lethargic and sweaty this am; BS checked and WNL; IV placed and EDP notified of acute symptoms; Pt remains A&Ox 4 at this time-Monique,RN

## 2020-09-09 NOTE — ED Notes (Signed)
Lunch Tray Ordered @ 1046. 

## 2020-09-09 NOTE — ED Provider Notes (Signed)
Received report that the patient has awoken with lower blood pressure than his normal. Has had decreased intake but normal fluid intake. Will resend labs, fluids, urine and pass onto day team to reassess when able for stability for ongoing psychiatric placement efforts.    Derrick Cooper, Barbara Cower, MD 09/09/20 [id number]
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
## 2020-09-09 NOTE — ED Notes (Addendum)
error 

## 2020-09-09 NOTE — ED Provider Notes (Signed)
Emergency Medicine Observation Re-evaluation Note  Derrick Cooper is a 60 y.o. male, seen on rounds today.  Pt initially presented to the ED for complaints of Suicidal Currently, the patient is watching tv. Patient seen by me ambulating to bathroom without difficulty. Vitals were checked immediately after ambulation.  Physical Exam  BP 103/77   Pulse (!) 115   Temp 98.9 F (37.2 C)   Resp 15   SpO2 97%  Physical Exam General: NAD. Wearing eye patch Cardiac: HR in the 90s during exam Lungs: normal work of breathing Psych: calm, denies SI/HI  ED Course / MDM  EKG:    I have reviewed the labs performed to date as well as medications administered while in observation.  Recent changes in the last 24 hours include hypotension earlier this morning 83/57. He was given LF fluid bolus. Lisinopril being held as blood pressures continue to be soft. Most recent 103/77.  Plan  Current plan is for reassessment by [facility] as patient is requesting to be discharged home.   Patient is under full IVC at this time. BH NP note from yesterday afternoon with plan for inpatient treatment    Kandice Hams 09/09/20 1508    Gwyneth Sprout, MD 09/14/20 1723

## 2020-09-09 NOTE — ED Notes (Signed)
Pt psych cleared. Faxed IVC recind paperwork. Magistrates office confirmed receipt. All of patients personal belongings returned to patient. Pt left with steady gait.

## 2020-09-11 ENCOUNTER — Emergency Department (HOSPITAL_COMMUNITY)
Admission: EM | Admit: 2020-09-11 | Discharge: 2020-09-11 | Disposition: A | Discharge status: Home / Self Care | Payer: Medicaid Other | Attending: Emergency Medicine | Admitting: Emergency Medicine

## 2020-09-11 DIAGNOSIS — Z7984 Long term (current) use of oral hypoglycemic drugs: Secondary | ICD-10-CM | POA: Insufficient documentation

## 2020-09-11 DIAGNOSIS — R456 Violent behavior: Secondary | ICD-10-CM | POA: Diagnosis present

## 2020-09-11 DIAGNOSIS — F17210 Nicotine dependence, cigarettes, uncomplicated: Secondary | ICD-10-CM | POA: Diagnosis not present

## 2020-09-11 DIAGNOSIS — E11610 Type 2 diabetes mellitus with diabetic neuropathic arthropathy: Secondary | ICD-10-CM | POA: Insufficient documentation

## 2020-09-11 DIAGNOSIS — Z79899 Other long term (current) drug therapy: Secondary | ICD-10-CM | POA: Diagnosis not present

## 2020-09-11 DIAGNOSIS — E1169 Type 2 diabetes mellitus with other specified complication: Secondary | ICD-10-CM | POA: Diagnosis not present

## 2020-09-11 DIAGNOSIS — E785 Hyperlipidemia, unspecified: Secondary | ICD-10-CM | POA: Diagnosis not present

## 2020-09-11 DIAGNOSIS — R4689 Other symptoms and signs involving appearance and behavior: Secondary | ICD-10-CM
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
## 2020-09-28 ENCOUNTER — Ambulatory Visit: Payer: Medicaid Other | Admitting: Family

## 2020-10-23 ENCOUNTER — Other Ambulatory Visit: Payer: Self-pay

## 2020-10-23 ENCOUNTER — Emergency Department (HOSPITAL_COMMUNITY): Payer: Medicaid Other

## 2020-10-23 ENCOUNTER — Encounter (HOSPITAL_COMMUNITY): Payer: Self-pay

## 2020-10-23 ENCOUNTER — Emergency Department (HOSPITAL_COMMUNITY)
Admission: EM | Admit: 2020-10-23 | Discharge: 2020-10-26 | Disposition: A | Discharge status: Home / Self Care | Payer: Medicaid Other | Attending: Emergency Medicine | Admitting: Emergency Medicine

## 2020-10-23 DIAGNOSIS — Z5900 Homelessness unspecified: Secondary | ICD-10-CM

## 2020-10-23 DIAGNOSIS — F250 Schizoaffective disorder, bipolar type: Secondary | ICD-10-CM | POA: Diagnosis present

## 2020-10-23 DIAGNOSIS — Z21 Asymptomatic human immunodeficiency virus [HIV] infection status: Secondary | ICD-10-CM | POA: Insufficient documentation

## 2020-10-23 DIAGNOSIS — E1140 Type 2 diabetes mellitus with diabetic neuropathy, unspecified: Secondary | ICD-10-CM | POA: Diagnosis not present

## 2020-10-23 DIAGNOSIS — R45851 Suicidal ideations: Secondary | ICD-10-CM | POA: Diagnosis not present

## 2020-10-23 DIAGNOSIS — Y907 Blood alcohol level of 200-239 mg/100 ml: Secondary | ICD-10-CM | POA: Insufficient documentation

## 2020-10-23 DIAGNOSIS — F1010 Alcohol abuse, uncomplicated: Secondary | ICD-10-CM | POA: Diagnosis present

## 2020-10-23 DIAGNOSIS — Z79899 Other long term (current) drug therapy: Secondary | ICD-10-CM | POA: Insufficient documentation

## 2020-10-23 DIAGNOSIS — Z20822 Contact with and (suspected) exposure to covid-19: Secondary | ICD-10-CM | POA: Diagnosis not present

## 2020-10-23 DIAGNOSIS — Z7984 Long term (current) use of oral hypoglycemic drugs: Secondary | ICD-10-CM | POA: Insufficient documentation

## 2020-10-23 DIAGNOSIS — F1994 Other psychoactive substance use, unspecified with psychoactive substance-induced mood disorder: Secondary | ICD-10-CM | POA: Diagnosis present

## 2020-10-23 DIAGNOSIS — F17210 Nicotine dependence, cigarettes, uncomplicated: Secondary | ICD-10-CM | POA: Diagnosis not present

## 2020-10-23 DIAGNOSIS — F209 Schizophrenia, unspecified: Secondary | ICD-10-CM | POA: Diagnosis not present

## 2020-10-23 DIAGNOSIS — F1510 Other stimulant abuse, uncomplicated: Secondary | ICD-10-CM | POA: Insufficient documentation

## 2020-10-23 DIAGNOSIS — F1910 Other psychoactive substance abuse, uncomplicated: Secondary | ICD-10-CM | POA: Diagnosis not present

## 2020-10-23 LAB — CBC WITH DIFFERENTIAL/PLATELET
Abs Immature Granulocytes: 0.01 10*3/uL (ref 0.00–0.07)
Basophils Absolute: 0 10*3/uL (ref 0.0–0.1)
Basophils Relative: 1 %
Eosinophils Absolute: 0 10*3/uL (ref 0.0–0.5)
Eosinophils Relative: 0 %
HCT: 38.2 % — ABNORMAL LOW (ref 39.0–52.0)
Hemoglobin: 12.5 g/dL — ABNORMAL LOW (ref 13.0–17.0)
Immature Granulocytes: 0 %
Lymphocytes Relative: 50 %
Lymphs Abs: 3 10*3/uL (ref 0.7–4.0)
MCH: 30.4 pg (ref 26.0–34.0)
MCHC: 32.7 g/dL (ref 30.0–36.0)
MCV: 92.9 fL (ref 80.0–100.0)
Monocytes Absolute: 0.6 10*3/uL (ref 0.1–1.0)
Monocytes Relative: 9 %
Neutro Abs: 2.4 10*3/uL (ref 1.7–7.7)
Neutrophils Relative %: 40 %
Platelets: 117 10*3/uL — ABNORMAL LOW (ref 150–400)
RBC: 4.11 MIL/uL — ABNORMAL LOW (ref 4.22–5.81)
RDW: 15.3 % (ref 11.5–15.5)
WBC: 6 10*3/uL (ref 4.0–10.5)
nRBC: 0 % (ref 0.0–0.2)

## 2020-10-23 LAB — ETHANOL: Alcohol, Ethyl (B): 229 mg/dL — ABNORMAL HIGH (ref <10)

## 2020-10-23 LAB — COMPREHENSIVE METABOLIC PANEL
ALT: 47 U/L — ABNORMAL HIGH (ref 0–44)
AST: 60 U/L — ABNORMAL HIGH (ref 15–41)
Albumin: 3.4 g/dL — ABNORMAL LOW (ref 3.5–5.0)
Alkaline Phosphatase: 72 U/L (ref 38–126)
Anion gap: 10 (ref 5–15)
BUN: 9 mg/dL (ref 6–20)
CO2: 23 mmol/L (ref 22–32)
Calcium: 8.4 mg/dL — ABNORMAL LOW (ref 8.9–10.3)
Chloride: 110 mmol/L (ref 98–111)
Creatinine, Ser: 0.61 mg/dL (ref 0.61–1.24)
GFR, Estimated: >60 mL/min (ref >60)
Glucose, Bld: 89 mg/dL (ref 70–99)
Potassium: 3.6 mmol/L (ref 3.5–5.1)
Sodium: 143 mmol/L (ref 135–145)
Total Bilirubin: 0.9 mg/dL (ref 0.3–1.2)
Total Protein: 9.1 g/dL — ABNORMAL HIGH (ref 6.5–8.1)

## 2020-10-23 LAB — RESP PANEL BY RT-PCR (FLU A&B, COVID) ARPGX2
Influenza A by PCR: NEGATIVE
Influenza B by PCR: NEGATIVE
SARS Coronavirus 2 by RT PCR: NEGATIVE

## 2020-10-23 LAB — ACETAMINOPHEN LEVEL: Acetaminophen (Tylenol), Serum: <10 ug/mL — ABNORMAL LOW (ref 10–30)

## 2020-10-23 LAB — RAPID URINE DRUG SCREEN, HOSP PERFORMED
Amphetamines: POSITIVE — AB
Barbiturates: NOT DETECTED
Benzodiazepines: POSITIVE — AB
Cocaine: POSITIVE — AB
Opiates: NOT DETECTED
Tetrahydrocannabinol: NOT DETECTED

## 2020-10-23 LAB — SALICYLATE LEVEL: Salicylate Lvl: <7 mg/dL — ABNORMAL LOW (ref 7.0–30.0)

## 2020-10-23 MED ORDER — METFORMIN HCL 500 MG PO TABS
500.0000 mg | ORAL_TABLET | Freq: Two times a day (BID) | ORAL | Status: DC
Start: 2020-10-23 — End: 2020-10-26
  Administered 2020-10-23 – 2020-10-26 (×6): 500 mg via ORAL
  Filled 2020-10-23 (×6): qty 1

## 2020-10-23 MED ORDER — STERILE WATER FOR INJECTION IJ SOLN
INTRAMUSCULAR | Status: AC
  Filled 2020-10-23: qty 10

## 2020-10-23 MED ORDER — THIAMINE HCL 100 MG PO TABS
100.0000 mg | ORAL_TABLET | Freq: Every day | ORAL | Status: DC
  Administered 2020-10-23 – 2020-10-26 (×4): 100 mg via ORAL
  Filled 2020-10-23 (×4): qty 1

## 2020-10-23 MED ORDER — LISINOPRIL 10 MG PO TABS
5.0000 mg | ORAL_TABLET | Freq: Every day | ORAL | Status: DC
  Administered 2020-10-23 – 2020-10-26 (×4): 5 mg via ORAL
  Filled 2020-10-23 (×4): qty 1

## 2020-10-23 MED ORDER — ZOLPIDEM TARTRATE 5 MG PO TABS
5.0000 mg | ORAL_TABLET | Freq: Every evening | ORAL | Status: DC | PRN
Start: 2020-10-23 — End: 2020-10-26
  Administered 2020-10-23 – 2020-10-25 (×2): 5 mg via ORAL
  Filled 2020-10-23 (×3): qty 1

## 2020-10-23 MED ORDER — ZIPRASIDONE MESYLATE 20 MG IM SOLR
20.0000 mg | Freq: Once | INTRAMUSCULAR | Status: AC
  Administered 2020-10-23: 20 mg via INTRAMUSCULAR
  Filled 2020-10-23: qty 20

## 2020-10-23 MED ORDER — ATORVASTATIN CALCIUM 40 MG PO TABS
40.0000 mg | ORAL_TABLET | Freq: Every day | ORAL | Status: DC
  Administered 2020-10-23 – 2020-10-26 (×4): 40 mg via ORAL
  Filled 2020-10-23 (×4): qty 1

## 2020-10-23 MED ORDER — HYDROXYZINE HCL 50 MG PO TABS
50.0000 mg | ORAL_TABLET | Freq: Every day | ORAL | Status: DC
  Administered 2020-10-23 – 2020-10-25 (×3): 50 mg via ORAL
  Filled 2020-10-23 (×5): qty 1

## 2020-10-23 MED ORDER — ACETAMINOPHEN 325 MG PO TABS
650.0000 mg | ORAL_TABLET | ORAL | Status: DC | PRN
  Filled 2020-10-23 (×2): qty 2

## 2020-10-23 MED ORDER — ONDANSETRON HCL 4 MG PO TABS: 4.0000 mg | ORAL_TABLET | Freq: Three times a day (TID) | ORAL | Status: DC | PRN

## 2020-10-23 MED ORDER — LORAZEPAM 2 MG/ML IJ SOLN
0.0000 mg | Freq: Four times a day (QID) | INTRAMUSCULAR | Status: AC
  Administered 2020-10-24: 2 mg via INTRAVENOUS
  Filled 2020-10-23: qty 1

## 2020-10-23 MED ORDER — LORAZEPAM 2 MG/ML IJ SOLN: 0.0000 mg | Freq: Two times a day (BID) | INTRAMUSCULAR | Status: DC

## 2020-10-23 MED ORDER — LORAZEPAM 1 MG PO TABS
2.0000 mg | ORAL_TABLET | Freq: Once | ORAL | Status: AC
  Administered 2020-10-23: 2 mg via ORAL
  Filled 2020-10-23: qty 2

## 2020-10-23 MED ORDER — BICTEGRAVIR-EMTRICITAB-TENOFOV 50-200-25 MG PO TABS
1.0000 | ORAL_TABLET | Freq: Every day | ORAL | Status: DC
  Administered 2020-10-24 – 2020-10-26 (×3): 1 via ORAL
  Filled 2020-10-23 (×4): qty 1

## 2020-10-23 MED ORDER — QUETIAPINE FUMARATE 100 MG PO TABS
200.0000 mg | ORAL_TABLET | Freq: Every day | ORAL | Status: DC
  Administered 2020-10-23 – 2020-10-25 (×3): 200 mg via ORAL
  Filled 2020-10-23 (×3): qty 2

## 2020-10-23 MED ORDER — THIAMINE HCL 100 MG/ML IJ SOLN
100.0000 mg | Freq: Every day | INTRAMUSCULAR | Status: DC
  Filled 2020-10-23: qty 2

## 2020-10-23 MED ORDER — NICOTINE 21 MG/24HR TD PT24
21.0000 mg | MEDICATED_PATCH | Freq: Every day | TRANSDERMAL | Status: DC
  Administered 2020-10-24 – 2020-10-26 (×3): 21 mg via TRANSDERMAL
  Filled 2020-10-23 (×3): qty 1

## 2020-10-23 MED ORDER — LORAZEPAM 1 MG PO TABS
0.0000 mg | ORAL_TABLET | Freq: Two times a day (BID) | ORAL | Status: DC
  Administered 2020-10-25: 1 mg via ORAL
  Filled 2020-10-23: qty 1

## 2020-10-23 MED ORDER — LORAZEPAM 1 MG PO TABS
0.0000 mg | ORAL_TABLET | Freq: Four times a day (QID) | ORAL | Status: AC
  Administered 2020-10-23: 2 mg via ORAL
  Administered 2020-10-24 (×2): 1 mg via ORAL
  Administered 2020-10-24 – 2020-10-25 (×2): 2 mg via ORAL
  Filled 2020-10-23: qty 2
  Filled 2020-10-23: qty 1
  Filled 2020-10-23 (×2): qty 2
  Filled 2020-10-23: qty 1
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
## 2020-10-23 NOTE — ED Notes (Signed)
Pt ambulatory with unsteady gait to bathroom, one person assistance.

## 2020-10-23 NOTE — ED Triage Notes (Signed)
Pt BIB [employer]. Pt has SI and auditory hallucinations. Pt reports drinking alcohol and using meth. Pt resting on arrival and woke up asking for his meth and crack pipe. Pt states that he wants to leave.

## 2020-10-23 NOTE — ED Notes (Addendum)
Patients vitals being taken, oxygen 88% room air. RN put patient on 2L Derrick Cooper. Patients CIWA 11, Dr. Wilkie Aye notified. Dr. Wilkie Aye gave verbal order for additional 2mg  oral Ativan, see MAR. She is aware of patients heart rate in 120s.

## 2020-10-23 NOTE — ED Provider Notes (Signed)
[facility]-EMERGENCY DEPT Provider Note   CSN: [id number] Arrival date & time: 10/23/20  1212     History Chief Complaint  Patient presents with  . Suicidal    Derrick Cooper is a 60 y.o. male.  HPI  Patient is 61 year old male presented today brought in by [employer].  Has endorsed suicidal thoughts to the police and seems to be reacting to internal stimuli.  Per PD has used alcohol and methamphetamine recently.  On my evaluation patient is yelling at please officers about their eyes.  Seems to have loose associations and tangential thinking has pressured speech.  Per security patient arrived in the emergency department of his own volition.  He then proceeded to scream and become very agitated about the staff around him.  When I evaluated patient he became very anxious about the appearance of my eyes and glasses.  He has a history of schizophrenia, HIV, hepatitis C, DM.  On my review of EMR it appears that he attempted to jump into traffic 09/06/2020 is brought to the ER for that reason.  Per security had had endorsed suicidal thoughts when he first arrived in the ER.  I am unable to obtain any information from him presently.  Level 5 caveat due to psychosis    Past Medical History:  Diagnosis Date  . Diabetes mellitus without complication (HCC)   . Hepatitis C   . HIV infection (HCC)   . Schizophrenia (HCC)   . Substance abuse [facility])     Patient Active Problem List   Diagnosis Date Noted  . Alcohol abuse   . Fracture of mandible (HCC)   . Acute respiratory failure with hypoxia (HCC) 12/27/2019  . Lobar pneumonia (HCC) 12/27/2019  . Community acquired pneumonia of right lung 12/27/2019  . Polysubstance abuse (HCC) 12/10/2019  . Homelessness 12/10/2019  . Substance induced mood disorder (HCC) 12/10/2019  . MDD (major depressive disorder), recurrent episode, severe (HCC) 08/09/2018  . Schizoaffective disorder, bipolar type (HCC)   . Health care maintenance  06/26/2018  . Tobacco use 06/26/2018  . Furuncle of right axilla 11/13/2017  . Hyperlipidemia associated with type 2 diabetes mellitus (HCC) 10/25/2017  . 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 HIV infection (HCC) 09/27/2017  . Chronic hepatitis C without hepatic coma (HCC) 09/27/2017  . Acute non-recurrent maxillary sinusitis 09/27/2017  . Schizophrenia (HCC) 09/07/2017  . Controlled diabetes mellitus with complication, without long-term current use of insulin (HCC) 09/07/2017  . Urinary incontinence 09/07/2017    Past Surgical History:  Procedure Laterality Date  . ORIF MANDIBULAR FRACTURE Left 12/31/2019   Procedure: OPEN REDUCTION INTERNAL FIXATION (ORIF) MANDIBULAR FRACTURE , NASAL  MAXILLO MANDIBULAR FIXATION;  Surgeon: [name], MD;  Location: [facility] OR;  Service: ENT;  Laterality: Left;       Family History  Problem Relation Age of Onset  . Breast cancer Mother   . Lung cancer Father   . Heart attack Father     Social History   Tobacco Use  . Smoking status: Current Every Day Smoker    Packs/day: 0.50    Years: 42.00    Pack years: 21.00    Types: Cigarettes  . Smokeless tobacco: Never Used  Vaping Use  . Vaping Use: Never used  Substance Use Topics  . Alcohol use: Yes    Comment: Clean for 2 months   . Drug use: Yes  Types: Marijuana, Methamphetamines, "Crack" cocaine    Comment: Has used crack in the last month; marajuana  occasionally    Home Medications Prior to Admission medications   Medication Sig Start Date End Date Taking? Authorizing Provider  atorvastatin (LIPITOR) 40 MG tablet Take 40 mg by mouth daily.  06/04/19   [provider]  bictegravir-emtricitabine-tenofovir AF (BIKTARVY) 50-200-25 MG TABS tablet Take 1 tablet by mouth daily. 08/27/20   Veryl Speakalone, Gregory D, FNP  HYDROcodone-acetaminophen (NORCO/VICODIN) 5-325 MG tablet Take 1-2  tablets by mouth every 4 (four) hours as needed for moderate pain. Patient not taking: No sig reported 01/03/20   Marinda ElkFeliz Ortiz, Abraham, MD  hydrOXYzine (VISTARIL) 50 MG capsule Take 50 mg by mouth at bedtime. 06/17/20   [provider]  lisinopril (ZESTRIL) 5 MG tablet Take 5 mg by mouth daily. 01/20/20   [provider]  metFORMIN (GLUCOPHAGE) 500 MG tablet Take 1 tablet (500 mg total) by mouth 2 (two) times daily with a meal. 10/28/19   Malvin JohnsFarah, Brian, MD  QUEtiapine (SEROQUEL) 200 MG tablet Take 1 tablet (200 mg total) by mouth at bedtime. 10/28/19   Malvin JohnsFarah, Brian, MD  sulfamethoxazole-trimethoprim (BACTRIM DS) 800-160 MG tablet Take 1 tablet by mouth daily. 08/27/20   Veryl Speakalone, Gregory D, FNP    Allergies    Patient has no known allergies.  Review of Systems   Review of Systems  Unable to perform ROS: Psychiatric disorder    Physical Exam Updated Vital Signs BP (!) 131/91 (BP Location: Left Arm)   Pulse (!) 106   Temp 98.8 F (37.1 C) (Oral)   Resp 15   SpO2 92%   Physical Exam Vitals and nursing note reviewed.  Constitutional:      General: He is not in acute distress.    Appearance: Normal appearance. He is not ill-appearing.  HENT:     Head: Normocephalic and atraumatic.  Eyes:     General: No scleral icterus.       Right eye: No discharge.        Left eye: No discharge.     Conjunctiva/sclera: Conjunctivae normal.  Pulmonary:     Effort: Pulmonary effort is normal.     Breath sounds: No stridor.  Neurological:     Mental Status: He is alert and oriented to person, place, and time. Mental status is at baseline.  Psychiatric:     Comments: Patient is actively screaming in the hallway.  Seems to have very tangential thoughts and will become initially very fixated on one aspect or appearance of RN staff and then switch to another.  No evidence of insight into his presentation.  He has pressured speech.  He is demonstrating violent behavior.     ED Results  / Procedures / Treatments   Labs (all labs ordered are listed, but only abnormal results are displayed) Labs Reviewed  COMPREHENSIVE METABOLIC PANEL - Abnormal; Notable for the following components:      Result Value   Calcium 8.4 (*)    Total Protein 9.1 (*)    Albumin 3.4 (*)    AST 60 (*)    ALT 47 (*)    All other components within normal limits  ETHANOL - Abnormal; Notable for the following components:   Alcohol, Ethyl (B) 229 (*)    All other components within normal limits  RAPID URINE DRUG SCREEN, HOSP PERFORMED - Abnormal; Notable for the following components:   Cocaine POSITIVE (*)    Benzodiazepines POSITIVE (*)  Amphetamines POSITIVE (*)    All other components within normal limits  CBC WITH DIFFERENTIAL/PLATELET - Abnormal; Notable for the following components:   RBC 4.11 (*)    Hemoglobin 12.5 (*)    HCT 38.2 (*)    Platelets 117 (*)    All other components within normal limits  SALICYLATE LEVEL - Abnormal; Notable for the following components:   Salicylate Lvl <7.0 (*)    All other components within normal limits  ACETAMINOPHEN LEVEL - Abnormal; Notable for the following components:   Acetaminophen (Tylenol), Serum <10 (*)    All other components within normal limits  RESP PANEL BY RT-PCR (FLU A&B, COVID) ARPGX2    EKG None  Radiology No results found.  Procedures Procedures   Medications Ordered in ED Medications  LORazepam (ATIVAN) injection 0-4 mg (has no administration in time range)    Or  LORazepam (ATIVAN) tablet 0-4 mg (has no administration in time range)  LORazepam (ATIVAN) injection 0-4 mg (has no administration in time range)    Or  LORazepam (ATIVAN) tablet 0-4 mg (has no administration in time range)  thiamine tablet 100 mg (has no administration in time range)    Or  thiamine (B-1) injection 100 mg (has no administration in time range)  acetaminophen (TYLENOL) tablet 650 mg (has no administration in time range)  zolpidem (AMBIEN)  tablet 5 mg (has no administration in time range)  ondansetron (ZOFRAN) tablet 4 mg (has no administration in time range)  nicotine (NICODERM CQ - dosed in mg/24 hours) patch 21 mg (has no administration in time range)  atorvastatin (LIPITOR) tablet 40 mg (has no administration in time range)  bictegravir-emtricitabine-tenofovir AF (BIKTARVY) 50-200-25 MG per tablet 1 tablet (has no administration in time range)  hydrOXYzine (VISTARIL) capsule 50 mg (has no administration in time range)  lisinopril (ZESTRIL) tablet 5 mg (has no administration in time range)  metFORMIN (GLUCOPHAGE) tablet 500 mg (has no administration in time range)  QUEtiapine (SEROQUEL) tablet 200 mg (has no administration in time range)  ziprasidone (GEODON) injection 20 mg (20 mg Intramuscular Given 10/23/20 1252)  sterile water (preservative free) injection (  Given by Other 10/23/20 1347)    ED Course  I have reviewed the triage vital signs and the nursing notes.  Pertinent labs & imaging results that were available during my care of the patient were reviewed by me and considered in my medical decision making (see chart for details).    MDM Rules/Calculators/A&P                          Patient is 60 year old male.  Presents to the ER with evidence of mania.  He has history of similar.  History of suicide attempts and according to security officer endorses suicidal thoughts before becoming quite aggressive with staff.  He required IM Geodon.  I reassessed patient after this was administered and he is now resting in bed.  Unable to interview him given that he is quite sleepy.  His breaths are unlabored he is well-appearing however.  Patient's lab work was reviewed.  Tylenol and salicylate level were undetectably low.  Ethanol is 229 which likely explains some of his presentation today.  He also is positive for cocaine amphetamines and benzodiazepines.  CBC without leukocytosis there is no significant anemia today.  CMP  without any significant electrolyte derangements mild transaminitis which is marginally elevated from prior.  This is likely secondary to alcohol use/chronic hepatitis  C.  Patient reevaluated again.  Continues to be resting comfortably in bed.  No further issues per RN staff.  Patient is medically cleared at this time  Consult placed to TTS.  All medications were ordered.   The patient has been placed in psychiatric observation due to the need to provide a safe environment for the patient while obtaining psychiatric consultation and evaluation, as well as ongoing medical and medication management to treat the patient's condition.  The patient has been placed under full IVC at this time.  Final Clinical Impression(s) / ED Diagnoses Final diagnoses:  None    Rx / DC Orders ED Discharge Orders    None       Gailen Shelter, [place] 10/23/20 1504    Bethann Berkshire, MD 10/24/20 [phone]

## 2020-10-23 NOTE — BH Assessment (Signed)
Reached out to [facility] Randa Evens, RN) regarding bed availability at [facility]. Per Randa Evens no beds available at this time. [facility] to review in the am pending [facility] bed availability.

## 2020-10-23 NOTE — ED Provider Notes (Signed)
Patient awaiting psychiatric evaluation.  Per nursing, they wake him up to get vital signs and give medications.  He was complaining of shortness of breath.  Pulse noted to be 125.  O2 sats 88 to 90%.  He was placed on 2 L.   Requested EKG, chest x-ray, repeat CIWA score.  Repeat CIWA was 11.  Patient was redosed 2 of Ativan.  Clinically he is nontoxic-appearing.  EKG shows sinus tachycardia.  He has some septal changes that do not appear acute but are changed from prior.  Troponin was added.   Physical Exam  BP 112/86   Pulse (!) 125   Temp 98.5 F (36.9 C) (Oral)   Resp 16   SpO2 90%   ED ECG REPORT   Date: 10/23/2020  Rate: 117  Rhythm: sinus tachycardia  QRS Axis: normal  Intervals: normal  ST/T Wave abnormalities: nonspecific ST changes  Conduction Disutrbances:none  Narrative Interpretation:   Old EKG Reviewed: changes noted  I have personally reviewed the EKG tracing and agree with the computerized printout as noted.      Shon Baton, MD 10/23/20 2227

## 2020-10-23 NOTE — ED Notes (Signed)
Pt started ripping the blood pressure cuff and pulse ox and got out of the bed. Pt started heading towards the EMS bay and security was called. Wylder PA at bedside and ordered Geodon and stated the patient will be IVC'd. Security able to convince pt to walk back towards his bed, but pt refused to sit back down. Pt began yelling that he was going to leave and tried to push through security to leave. Security physically restrained pt using STARR technique and returned him to the bed. Pt yelling that we are vampires and we're going to hurt him.

## 2020-10-23 NOTE — ED Notes (Signed)
Pt now asleep, equal chest rise noted.

## 2020-10-24 LAB — TROPONIN I (HIGH SENSITIVITY)
Troponin I (High Sensitivity): 17 ng/L (ref <18)
Troponin I (High Sensitivity): 11 ng/L (ref <18)

## 2020-10-24 NOTE — ED Provider Notes (Signed)
Emergency Medicine Observation Re-evaluation Note  Derrick Cooper is a 60 y.o. male, seen on rounds today.  Pt initially presented to the ED for complaints of Suicidal Currently, the patient is resting comfortably.  Physical Exam  BP (!) 147/101 (BP Location: Left Arm)   Pulse (!) 110   Temp 99 F (37.2 C) (Oral)   Resp 18   SpO2 95%  Physical Exam General: No distress Lungs: Resp even and unlabored Psych: Sleeping soundly  ED Course / MDM  EKG:EKG Interpretation  Date/Time:  Friday October 23 2020 22:21:21 EDT Ventricular Rate:  117 PR Interval:  138 QRS Duration: 92 QT Interval:  330 QTC Calculation: 460 R Axis:   61 Text Interpretation: Sinus tachycardia Septal infarct , age undetermined Abnormal ECG similar to prior Confirmed by Alona Bene [phone]) on 10/24/2020 12:06:14 AM   I have reviewed the labs performed to date as well as medications administered while in observation.  Recent changes in the last 24 hours include none.  Plan  Current plan is for Psych admit. Patient is not under full IVC at this time.   Pollyann Savoy, MD 10/24/20 [phone]

## 2020-10-24 NOTE — Progress Notes (Signed)
Per Nanine Means DNP, patient meets criteria for inpatient treatment. There are no appropriate beds available at [facility] today. CSW faxed referrals to the following facilities for review:  [facility] beds available today [facility] beds available today. [facility]  TTS will continue to seek bed placement.   Trula Slade, MSW, LCSW Clinical Social Worker 10/24/2020 9:53 AM

## 2020-10-24 NOTE — ED Notes (Signed)
Dr. Jacqulyn Bath aware of patients heart rate in 120's consistently through the night. He said to notify him if it increases or if his CIWA score increases.

## 2020-10-24 NOTE — ED Notes (Signed)
Patient changed out into burgundy scrubs and wanded by security. Patients belongings at nurse station in 1 bag with patients label and across from room 18.

## 2020-10-24 NOTE — ED Notes (Signed)
Pt was dressed out into burgundy scrubs and socks. Pt has 3 shirts, jeans, belt, and a wallet in one bag. This bag has pt label on it and it is located in cabinet labeled "Patient belongings 16-18 Resus A".

## 2020-10-24 NOTE — ED Notes (Signed)
Loose dollar bills fell out of pt's pockets. This money was put into the pt's wallet and placed with their other belongings. Security was called to wand the pt for safety.

## 2020-10-25 LAB — CBG MONITORING, ED
Glucose-Capillary: 126 mg/dL — ABNORMAL HIGH (ref 70–99)
Glucose-Capillary: 114 mg/dL — ABNORMAL HIGH (ref 70–99)

## 2020-10-25 NOTE — BH Assessment (Incomplete)
Pt is a 60 year old male who remains at [facility] due to suicidal ideation, apparent hallucination and delusion, altered mental status, and substance use.  Per IVC:  "Patient is a 60 year old single male who presents to [facility] actively impaired with AMS. Patient is observed to be yelling at staff asking "are you vampires." Patient is observed to be highly agitated and appears to be responding to internal stimuli as evidenced by patient reaching for objects that are in the air and repeating "they are in my head." Patient will not respond to this writer's questions this writer and cannot be redirected. Patient voices he is "going to kill himself, kill himself" although does not voice immediate plan. Information to complete assessment was obtained from admission notes and history.   Per notes on arrival [facility] PA writes: Patient is 60 year old male presented today brought in by [employer]. Has endorsed suicidal thoughts to the police and seems to be reacting to internal stimuli. Per PD has used alcohol and methamphetamine recently."  Pt was reassessed this AM.  Pt was sitting calmly.  He stated that he is not suicidal or homicidal today.  Pt acknowledged a history of hallucination, but denied current hallucination.  He acknowledged recent substance use.  Pt's UDS was positive for cocaine and amphetamines.    Pt stated that he is homeless and on disability.  He stated also that he used to receive outpatient services through [facility], but acknowledged that it has been ''ages'' since he received services through them.  Recommend continued inpatient.

## 2020-10-25 NOTE — BH Assessment (Addendum)
Per [facility] (Caroline B. RN) and Psychiatrist (Dr. Jola Babinski) no beds available at [facility] for today. Dr. Jola Babinski advised this Disposition Counselor to fax patient out to facilities. Patient re-faxed to multiple facilities and pending review.   [facility] beds available today [facility] [place] [facility] beds available today. [facility]  Disposition Counselor/LCSW will continue to seek bed placement. [facility] nursing and EDP provided disposition updates.

## 2020-10-26 ENCOUNTER — Encounter (HOSPITAL_COMMUNITY): Payer: Self-pay | Admitting: Registered Nurse

## 2020-10-26 DIAGNOSIS — F1910 Other psychoactive substance abuse, uncomplicated: Secondary | ICD-10-CM

## 2020-10-26 DIAGNOSIS — F1010 Alcohol abuse, uncomplicated: Secondary | ICD-10-CM | POA: Diagnosis not present

## 2020-10-26 DIAGNOSIS — Z5900 Homelessness unspecified: Secondary | ICD-10-CM

## 2020-10-26 DIAGNOSIS — F1994 Other psychoactive substance use, unspecified with psychoactive substance-induced mood disorder: Secondary | ICD-10-CM | POA: Diagnosis not present

## 2020-10-26 DIAGNOSIS — F250 Schizoaffective disorder, bipolar type: Secondary | ICD-10-CM

## 2020-10-26 LAB — CBG MONITORING, ED: Glucose-Capillary: 92 mg/dL (ref 70–99)

## 2020-10-26 MED ORDER — SULFAMETHOXAZOLE-TRIMETHOPRIM 800-160 MG PO TABS
1.0000 | ORAL_TABLET | Freq: Every day | ORAL | Status: DC
  Administered 2020-10-26: 1 via ORAL

## 2020-10-26 NOTE — BH Assessment (Signed)
[facility] Assessment Progress Note  Per Shuvon Rankin, NP, this pt does not require psychiatric hospitalization at this time.  Pt presents under IVC initiated by EDP Bethann Berkshire, MD, which has been rescinded by Nelly Rout, MD.  Pt is psychiatrically cleared, but would benefit from admission to a residential substance use treatment facility.  Shuvon recommends referring pt to the [employer] at [facility], however, when this Clinical research associate spoke to pt he only wants to go to a nearby facility.  The only program of this sort is [facility] [place].  I made referral, but at 15:29 pt's nurse, Addison Naegeli, reports that pt is growing tired of waiting.  I then spoke to pt and he confirms that he wants to leave.  I staffed this with Shuvon and with EDP Frederick Peers, MD and they agree to discharge pt.  Discharge instructions include referral information for area substance use treatment providers.  Addison Naegeli has been notified.  Doylene Canning, MA Triage Specialist [phone]

## 2020-10-26 NOTE — Discharge Instructions (Signed)
To help you maintain a sober lifestyle, a substance abuse treatment program may be beneficial to you.  Contact one of the following providers at your earliest opportunity to ask about enrolling their program: ° °RESIDENTIAL PROGRAMS: ° °     [facility] °     [address] °     [place], [place] [address] °     [phone] ° °     [facility] °     [address] °     [place], [place] [address] °     [phone] ° °     [facility] °     [address] °     [place], [place] [address] °     [phone] ° °     [facility] °     [address] °     [place], [place] [address] °     [phone] ° °     [facility] °     [address] °     [place], [place] [address] °     [phone] ° °OUTPATIENT PROGRAMS: ° °     [facility] °     [address] °     [place], [place] [address] °     [phone] °     Ask about their Substance Abuse Intensive Outpatient Program.  They also offer psychiatry/medication management and therapy.  New patients are being seen in their walk-in clinic.  Walk-in hours are Monday - Thursday from 8:00 am - 11:00 am for psychiatry, and Friday from 1:00 pm - 4:00 pm for therapy.  Walk-in patients are seen on a first come, first served basis, so try to arrive as early as possible for the best chance of being seen the same day. ° °

## 2020-10-26 NOTE — Progress Notes (Signed)
TOC CM contacted [facility] for [facility] for home. Pt is currently homeless and will need a ride to [employer] to put his name on list for a shelter bed. Derrick Donning RN CCM, [facility] ED TOC CM [phone]

## 2020-10-26 NOTE — Progress Notes (Signed)
10/26/2020  1049  Patient denies SI.

## 2020-10-26 NOTE — ED Provider Notes (Signed)
Emergency Medicine Observation Re-evaluation Note  Derrick Cooper is a 60 y.o. male, seen on rounds today.  Pt initially presented to the ED for complaints of Suicidal Currently, the patient is awaiting psych re-eval.  Physical Exam  BP 140/88 (BP Location: Left Arm)   Pulse (!) 104   Temp 98.5 F (36.9 C) (Oral)   Resp 18   SpO2 97%  Physical Exam General: alert, patch over left eye Pulm: normal WOB Neuro: fluent speech Psych: calm, cooperative  ED Course / MDM  EKG:EKG Interpretation  Date/Time:  Friday October 23 2020 22:21:21 EDT Ventricular Rate:  117 PR Interval:  138 QRS Duration: 92 QT Interval:  330 QTC Calculation: 460 R Axis:   61 Text Interpretation: Sinus tachycardia Septal infarct , age undetermined Abnormal ECG similar to prior Confirmed by Alona Bene [phone]) on 10/24/2020 12:06:14 AM   I have reviewed the labs performed to date as well as medications administered while in observation.  Recent changes in the last 24 hours include psychiatry evaluated patient this morning. They have determined he no longer meets inpatient criteria and can be discharged.  Plan  Current plan is for discharge. Patient is not under full IVC at this time.   Orvella Digiulio, Ambrose [place], MD 10/26/20 1356

## 2020-10-26 NOTE — Consult Note (Addendum)
Telepsych Consultation   Reason for Consult:  Psychosis Referring Physician:  Gailen Shelter, PA Location of Patient: [facility] ED Location of Provider: Other: [facility]  Patient Identification: Derrick Cooper MRN:  [id number] Principal Diagnosis: Substance induced mood disorder (HCC) Diagnosis:  Principal Problem:   Substance induced mood disorder (HCC) Active Problems:   Schizophrenia (HCC)   Schizoaffective disorder, bipolar type (HCC)   Polysubstance abuse (HCC)   Homelessness   Alcohol abuse   Total Time spent with patient: 30 minutes  Subjective:   Derrick Cooper is a 60 y.o. male patient admitted to [facility] ED after presenting intoxicated via law enforcement with complaints of auditory hallucinations and suicidal ideation.Marland Kitchen  HPI:  Derrick Cooper, 60 y.o., male patient seen via tele health by this provider, consulted with Dr. Nelly Rout; and chart reviewed on 10/26/20.  On evaluation Derrick Cooper reports he was brought to the hospital after a breakdown while intoxicated.  Patient states he is doing fine today; eating and sleeping without any difficulty.  Patient denies suicidal/homicidal ideation, psychosis, paranoia.  Patient denies any outpatient psychiatric services at this time but states he was at [facility] at 1 time.  Patient reports he has been off his medications for a month stating a month ago psychiatric admission where he was restarted on his medication but has not followed up with anyone and no refills.  Patient states that he has been going to the [facility] and has been working with the Child psychotherapist they are trying to get him into a program that is in [place].  Patient reporting he is feeling better and wanted to go home.  Patient is homeless but states that he is able to stay the night with a friend and since it is close to the time of him to get his next check he probably will stay there little longer.  Discussed long-term rehab programs asked patient about [facility] rescue mission During  evaluation Derrick Cooper is sitting up in bed in no acute distress.  He is alert, oriented x 4, calm and cooperative.  His mood is euthymic with congruent affect.  He does not appear to be responding to internal/external stimuli or delusional thoughts.  Patient denies suicidal/self-harm/homicidal ideation, psychosis, and paranoia.  Patient answered question appropriately.  Past Psychiatric History: See above  Risk to Self:  Denies Risk to Others:  Denies Prior Inpatient Therapy:  Yes Prior Outpatient Therapy:  History of [facility] for outpatient psychiatric services none at this time.  Behavioral health coordinator will set patient up with outpatient psychiatric services  Past Medical History:  Past Medical History:  Diagnosis Date  . Diabetes mellitus without complication (HCC)   . Hepatitis C   . HIV infection (HCC)   . Schizophrenia (HCC)   . Substance abuse [facility])     Past Surgical History:  Procedure Laterality Date  . ORIF MANDIBULAR FRACTURE Left 12/31/2019   Procedure: OPEN REDUCTION INTERNAL FIXATION (ORIF) MANDIBULAR FRACTURE , NASAL  MAXILLO MANDIBULAR FIXATION;  Surgeon: [name], MD;  Location: [facility] OR;  Service: ENT;  Laterality: Left;   Family History:  Family History  Problem Relation Age of Onset  . Breast cancer Mother   . Lung cancer Father   . Heart attack Father    Family Psychiatric  History: None reported Social History:  Social History   Substance and Sexual Activity  Alcohol Use Yes   Comment: Clean for 2 months      Social History  Substance and Sexual Activity  Drug Use Yes  . Types: Marijuana, Methamphetamines, "Crack" cocaine   Comment: Has used crack in the last month; marajuana  occasionally    Social History   Socioeconomic History  . Marital status: Single    Spouse name: Not on file  . Number of children: 0  . Years of education: 9  . Highest education level: Not on file  Occupational History  . Occupation: unemployed   Tobacco Use  . Smoking status: Current Every Day Smoker    Packs/day: 0.50    Years: 42.00    Pack years: 21.00    Types: Cigarettes  . Smokeless tobacco: Never Used  Vaping Use  . Vaping Use: Never used  Substance and Sexual Activity  . Alcohol use: Yes    Comment: Clean for 2 months   . Drug use: Yes    Types: Marijuana, Methamphetamines, "Crack" cocaine    Comment: Has used crack in the last month; marajuana  occasionally  . Sexual activity: Not Currently    Partners: Female    Comment: declined condoms 08/27/20  Other Topics Concern  . Not on file  Social History Narrative  . Not on file   Social Determinants of Health   Financial Resource Strain: Not on file  Food Insecurity: Not on file  Transportation Needs: Not on file  Physical Activity: Not on file  Stress: Not on file  Social Connections: Not on file   Additional Social History:    Allergies:  No Known Allergies  Labs:  Results for orders placed or performed during the hospital encounter of 10/23/20 (from the past 48 hour(s))  CBG monitoring, ED     Status: Abnormal   Collection Time: 10/25/20  9:45 AM  Result Value Ref Range   Glucose-Capillary 126 (H) 70 - 99 mg/dL    Comment: Glucose reference range applies only to samples taken after fasting for at least 8 hours.   Comment 1 Notify RN    Comment 2 Document in Chart   POC CBG, ED     Status: Abnormal   Collection Time: 10/25/20  6:45 PM  Result Value Ref Range   Glucose-Capillary 114 (H) 70 - 99 mg/dL    Comment: Glucose reference range applies only to samples taken after fasting for at least 8 hours.   Comment 1 Notify RN    Comment 2 Document in Chart   POC CBG, ED     Status: None   Collection Time: 10/26/20  9:15 AM  Result Value Ref Range   Glucose-Capillary 92 70 - 99 mg/dL    Comment: Glucose reference range applies only to samples taken after fasting for at least 8 hours.    Medications:  Current Facility-Administered Medications   Medication Dose Route Frequency Provider Last Rate Last Admin  . acetaminophen (TYLENOL) tablet 650 mg  650 mg Oral Q4H PRN Solon Augusta S, PA      . atorvastatin (LIPITOR) tablet 40 mg  40 mg Oral Daily Solon Augusta S, PA   40 mg at 10/26/20 0936  . bictegravir-emtricitabine-tenofovir AF (BIKTARVY) 50-200-25 MG per tablet 1 tablet  1 tablet Oral Daily Solon Augusta S, [place]   1 tablet at 10/26/20 0936  . hydrOXYzine (ATARAX/VISTARIL) tablet 50 mg  50 mg Oral QHS Solon Augusta S, PA   50 mg at 10/25/20 2309  . lisinopril (ZESTRIL) tablet 5 mg  5 mg Oral Daily Solon Augusta S, [place]   5 mg at 10/26/20  1610  . LORazepam (ATIVAN) injection 0-4 mg  0-4 mg Intravenous Q12H Solon Augusta S, PA       Or  . LORazepam (ATIVAN) tablet 0-4 mg  0-4 mg Oral Q12H Fondaw, Wylder S, PA   1 mg at 10/25/20 2309  . metFORMIN (GLUCOPHAGE) tablet 500 mg  500 mg Oral BID WC Fondaw, Wylder S, PA   500 mg at 10/26/20 0900  . nicotine (NICODERM CQ - dosed in mg/24 hours) patch 21 mg  21 mg Transdermal Daily Solon Augusta S, PA   21 mg at 10/26/20 0943  . ondansetron (ZOFRAN) tablet 4 mg  4 mg Oral Q8H PRN Fondaw, Wylder S, PA      . QUEtiapine (SEROQUEL) tablet 200 mg  200 mg Oral QHS Fondaw, Wylder S, PA   200 mg at 10/25/20 2310  . sulfamethoxazole-trimethoprim (BACTRIM DS) 800-160 MG per tablet 1 tablet  1 tablet Oral Daily Little, Ambrose Finland, MD   1 tablet at 10/26/20 1106  . thiamine tablet 100 mg  100 mg Oral Daily Solon Augusta S, [place]   100 mg at 10/26/20 9604   Or  . thiamine (B-1) injection 100 mg  100 mg Intravenous Daily Fondaw, Wylder S, PA      . zolpidem (AMBIEN) tablet 5 mg  5 mg Oral QHS PRN Solon Augusta S, PA   5 mg at 10/25/20 0031   Current Outpatient Medications  Medication Sig Dispense Refill  . atorvastatin (LIPITOR) 40 MG tablet Take 40 mg by mouth daily.     . bictegravir-emtricitabine-tenofovir AF (BIKTARVY) 50-200-25 MG TABS tablet Take 1 tablet by mouth daily. 30 tablet 0  .  hydrOXYzine (VISTARIL) 50 MG capsule Take 50 mg by mouth at bedtime.    Marland Kitchen lisinopril (ZESTRIL) 5 MG tablet Take 5 mg by mouth daily.    . QUEtiapine (SEROQUEL) 200 MG tablet Take 1 tablet (200 mg total) by mouth at bedtime. 30 tablet 2  . sulfamethoxazole-trimethoprim (BACTRIM DS) 800-160 MG tablet Take 1 tablet by mouth daily. 30 tablet 3  . HYDROcodone-acetaminophen (NORCO/VICODIN) 5-325 MG tablet Take 1-2 tablets by mouth every 4 (four) hours as needed for moderate pain. (Patient not taking: No sig reported) 30 tablet 0  . metFORMIN (GLUCOPHAGE) 500 MG tablet Take 1 tablet (500 mg total) by mouth 2 (two) times daily with a meal. (Patient not taking: Reported on 10/24/2020) 60 tablet 3    Musculoskeletal: Strength & Muscle Tone: within normal limits Gait & Station: normal Patient leans: N/A  Psychiatric Specialty Exam: Physical Exam Vitals and nursing note reviewed. Chaperone present: Sitter at bedside.  Constitutional:      General: He is not in acute distress.    Appearance: Normal appearance. He is not ill-appearing.  Cardiovascular:     Rate and Rhythm: Normal rate.  Pulmonary:     Effort: Pulmonary effort is normal.  Musculoskeletal:        General: Normal range of motion.     Cervical back: Normal range of motion.  Neurological:     Mental Status: He is alert and oriented to person, place, and time.  Psychiatric:        Attention and Perception: Attention and perception normal. He does not perceive auditory or visual hallucinations.        Mood and Affect: Mood and affect normal.        Speech: Speech normal.        Behavior: Behavior normal. Behavior is cooperative.  Thought Content: Thought content normal. Thought content is not paranoid or delusional. Thought content does not include homicidal or suicidal ideation.        Cognition and Memory: Cognition and memory normal.        Judgment: Judgment normal.     Review of Systems  Constitutional: Negative.   HENT:  Negative.   Eyes:       Eye patch over left eye   Respiratory: Negative.   Cardiovascular: Negative.   Gastrointestinal: Negative.   Genitourinary: Negative.   Musculoskeletal: Negative.   Skin: Negative.   Neurological: Negative.   Hematological: Negative.   Psychiatric/Behavioral: Negative for agitation, behavioral problems, confusion, hallucinations ( Denies), self-injury ( Denies) and suicidal ideas ( Denies). The patient is not nervous/anxious.        Patient reports he had had a breakdown related to drinking but is feeling fine now.    Blood pressure 140/88, pulse (!) 104, temperature 98.5 F (36.9 C), temperature source Oral, resp. rate 18, SpO2 97 %.There is no height or weight on file to calculate BMI.  General Appearance: Casual  Eye Contact:  Good eye patch of the left eye  Speech:  Clear and Coherent  Volume:  Normal  Mood:  Euthymic  Affect:  Appropriate and Congruent  Thought Process:  Coherent, Goal Directed and Descriptions of Associations: Intact  Orientation:  Full (Time, Place, and Person)  Thought Content:  WDL  Suicidal Thoughts:  No  Homicidal Thoughts:  No  Memory:  Immediate;   Good Recent;   Good  Judgement:  Intact  Insight:  Present  Psychomotor Activity:  Normal  Concentration:  Concentration: Good and Attention Span: Good  Recall:  Good  Fund of Knowledge:  Good  Language:  Good  Akathisia:  No  Handed:  Right  AIMS (if indicated):     Assets:  Communication Skills Desire for Improvement Resilience Social Support  ADL's:  Intact  Cognition:  WNL  Sleep:      Patient reporting that he is feeling better and was brought in while he was intoxicated.  Patient interested in outpatient psychiatric services and rehab services.    Treatment Plan Summary: Plan Psychiatrically cleared.  Behavioral health coordinator will set up for outpatient psychiatric services.  Disposition:  Psychiatrically cleared No evidence of imminent risk to self or  others at present.   Patient does not meet criteria for psychiatric inpatient admission. Supportive therapy provided about ongoing stressors. Refer to IOP. Discussed crisis plan, support from social network, calling 911, coming to the Emergency Department, and calling Suicide Hotline.  This service was provided via telemedicine using a 2-way, interactive audio and video technology.  Names of all persons participating in this telemedicine service and their role in this encounter. Name: Assunta FoundShuvon Rankin Role: NP  Name: Dr. Nelly RoutArchana Ronit Marczak Role: Psychiatrist  Name: Inge RiseKent Deanda Role: Patient  Name: Dr. Pecolia AdesMorgan Little Role: Lucien MonsWL EDP sent a secure message informing: Patient seen and psychiatrically cleared.  Behavioral Health Coordinator will set up with outpatient psychiatric services and assist with rehab services for substance use.      Shuvon Rankin, NP 10/26/2020 1:34 PM   Agree with above assessment and plan Nelly RoutArchana Jemarcus Dougal, MD

## 2020-10-28 ENCOUNTER — Encounter: Payer: Self-pay | Admitting: *Deleted

## 2020-10-28 NOTE — Congregational Nurse Program (Signed)
  Dept: [phone]   Congregational Nurse Program Note  Date of Encounter: 10/28/2020  Past Medical History: Past Medical History:  Diagnosis Date  . Diabetes mellitus without complication (HCC)   . Hepatitis C   . HIV infection (HCC)   . Schizophrenia (HCC)   . Substance abuse [facility])     Encounter Details:  CNP Questionnaire - 10/28/20 1019      Questionnaire   Do you give verbal consent to treat you today? Yes    Visit Setting Church or Organization    Location Patient Served At [facility]    Patient Status Homeless    Medical Provider Yes    Insurance Medicaid    Intervention Support;Refer    Housing/Utilities No permanent housing    Transportation Need transportation assistance    Referrals PCP - other provider          Client came into [facility] and had been drinking alcohol. Followed up with client since ED visit.  Checked vitals and CBG 231. Checked ED follow up recommendations. Referred to Katherine Mantle PA. Offered to contact for client and he declines at this time. Client is meeting someone outside of [facility] and is not interested in getting clean from drugs at this time. Delphina Schum W RN CN [phone]

## 2020-11-13 ENCOUNTER — Other Ambulatory Visit: Payer: Self-pay

## 2020-11-13 ENCOUNTER — Encounter (HOSPITAL_COMMUNITY): Payer: Self-pay | Admitting: Emergency Medicine

## 2020-11-13 ENCOUNTER — Emergency Department (HOSPITAL_COMMUNITY)
Admission: EM | Admit: 2020-11-13 | Discharge: 2020-11-15 | Disposition: A | Discharge status: Home / Self Care | Payer: Medicaid Other | Attending: Emergency Medicine | Admitting: Emergency Medicine

## 2020-11-13 DIAGNOSIS — F1420 Cocaine dependence, uncomplicated: Secondary | ICD-10-CM | POA: Diagnosis not present

## 2020-11-13 DIAGNOSIS — E119 Type 2 diabetes mellitus without complications: Secondary | ICD-10-CM | POA: Insufficient documentation

## 2020-11-13 DIAGNOSIS — R443 Hallucinations, unspecified: Secondary | ICD-10-CM | POA: Diagnosis present

## 2020-11-13 DIAGNOSIS — F17210 Nicotine dependence, cigarettes, uncomplicated: Secondary | ICD-10-CM | POA: Diagnosis not present

## 2020-11-13 DIAGNOSIS — F209 Schizophrenia, unspecified: Secondary | ICD-10-CM | POA: Insufficient documentation

## 2020-11-13 DIAGNOSIS — F1020 Alcohol dependence, uncomplicated: Secondary | ICD-10-CM | POA: Diagnosis not present

## 2020-11-13 DIAGNOSIS — R45851 Suicidal ideations: Secondary | ICD-10-CM | POA: Diagnosis not present

## 2020-11-13 DIAGNOSIS — Z7984 Long term (current) use of oral hypoglycemic drugs: Secondary | ICD-10-CM | POA: Insufficient documentation

## 2020-11-13 DIAGNOSIS — F1994 Other psychoactive substance use, unspecified with psychoactive substance-induced mood disorder: Secondary | ICD-10-CM | POA: Diagnosis present

## 2020-11-13 DIAGNOSIS — Z046 Encounter for general psychiatric examination, requested by authority: Secondary | ICD-10-CM | POA: Insufficient documentation

## 2020-11-13 DIAGNOSIS — Z21 Asymptomatic human immunodeficiency virus [HIV] infection status: Secondary | ICD-10-CM | POA: Diagnosis not present

## 2020-11-13 DIAGNOSIS — Y908 Blood alcohol level of 240 mg/100 ml or more: Secondary | ICD-10-CM | POA: Diagnosis not present

## 2020-11-13 DIAGNOSIS — F250 Schizoaffective disorder, bipolar type: Secondary | ICD-10-CM | POA: Diagnosis present

## 2020-11-13 DIAGNOSIS — F1010 Alcohol abuse, uncomplicated: Secondary | ICD-10-CM

## 2020-11-13 DIAGNOSIS — Z20822 Contact with and (suspected) exposure to covid-19: Secondary | ICD-10-CM | POA: Insufficient documentation

## 2020-11-13 DIAGNOSIS — F29 Unspecified psychosis not due to a substance or known physiological condition: Secondary | ICD-10-CM

## 2020-11-13 LAB — COMPREHENSIVE METABOLIC PANEL
ALT: 42 U/L (ref 0–44)
AST: 77 U/L — ABNORMAL HIGH (ref 15–41)
Albumin: 3.1 g/dL — ABNORMAL LOW (ref 3.5–5.0)
Alkaline Phosphatase: 61 U/L (ref 38–126)
Anion gap: 9 (ref 5–15)
BUN: 6 mg/dL (ref 6–20)
CO2: 27 mmol/L (ref 22–32)
Calcium: 8.4 mg/dL — ABNORMAL LOW (ref 8.9–10.3)
Chloride: 105 mmol/L (ref 98–111)
Creatinine, Ser: 0.79 mg/dL (ref 0.61–1.24)
GFR, Estimated: >60 mL/min (ref >60)
Glucose, Bld: 133 mg/dL — ABNORMAL HIGH (ref 70–99)
Potassium: 4 mmol/L (ref 3.5–5.1)
Sodium: 141 mmol/L (ref 135–145)
Total Bilirubin: 0.3 mg/dL (ref 0.3–1.2)
Total Protein: 9.3 g/dL — ABNORMAL HIGH (ref 6.5–8.1)

## 2020-11-13 LAB — RAPID URINE DRUG SCREEN, HOSP PERFORMED
Amphetamines: NOT DETECTED
Barbiturates: NOT DETECTED
Benzodiazepines: NOT DETECTED
Cocaine: NOT DETECTED
Opiates: NOT DETECTED
Tetrahydrocannabinol: NOT DETECTED

## 2020-11-13 LAB — URINALYSIS, ROUTINE W REFLEX MICROSCOPIC
Bilirubin Urine: NEGATIVE
Glucose, UA: NEGATIVE mg/dL
Ketones, ur: NEGATIVE mg/dL
Leukocytes,Ua: NEGATIVE
Nitrite: NEGATIVE
Protein, ur: NEGATIVE mg/dL
Specific Gravity, Urine: 1.003 — ABNORMAL LOW (ref 1.005–1.030)
pH: 6 (ref 5.0–8.0)

## 2020-11-13 LAB — CBC WITH DIFFERENTIAL/PLATELET
Abs Immature Granulocytes: 0.01 10*3/uL (ref 0.00–0.07)
Basophils Absolute: 0 10*3/uL (ref 0.0–0.1)
Basophils Relative: 1 %
Eosinophils Absolute: 0 10*3/uL (ref 0.0–0.5)
Eosinophils Relative: 1 %
HCT: 38.6 % — ABNORMAL LOW (ref 39.0–52.0)
Hemoglobin: 12.8 g/dL — ABNORMAL LOW (ref 13.0–17.0)
Immature Granulocytes: 0 %
Lymphocytes Relative: 77 %
Lymphs Abs: 4.5 10*3/uL — ABNORMAL HIGH (ref 0.7–4.0)
MCH: 30.3 pg (ref 26.0–34.0)
MCHC: 33.2 g/dL (ref 30.0–36.0)
MCV: 91.3 fL (ref 80.0–100.0)
Monocytes Absolute: 0.5 10*3/uL (ref 0.1–1.0)
Monocytes Relative: 8 %
Neutro Abs: 0.8 10*3/uL — ABNORMAL LOW (ref 1.7–7.7)
Neutrophils Relative %: 13 %
Platelets: 91 10*3/uL — ABNORMAL LOW (ref 150–400)
RBC: 4.23 MIL/uL (ref 4.22–5.81)
RDW: 14.8 % (ref 11.5–15.5)
WBC: 5.8 10*3/uL (ref 4.0–10.5)
nRBC: 0 % (ref 0.0–0.2)

## 2020-11-13 LAB — RESP PANEL BY RT-PCR (FLU A&B, COVID) ARPGX2
Influenza A by PCR: NEGATIVE
Influenza B by PCR: NEGATIVE
SARS Coronavirus 2 by RT PCR: NEGATIVE

## 2020-11-13 LAB — ETHANOL: Alcohol, Ethyl (B): 397 mg/dL (ref <10)

## 2020-11-13 LAB — SALICYLATE LEVEL: Salicylate Lvl: <7 mg/dL — ABNORMAL LOW (ref 7.0–30.0)

## 2020-11-13 LAB — ACETAMINOPHEN LEVEL: Acetaminophen (Tylenol), Serum: <10 ug/mL — ABNORMAL LOW (ref 10–30)

## 2020-11-13 MED ORDER — THIAMINE HCL 100 MG PO TABS
100.0000 mg | ORAL_TABLET | Freq: Every day | ORAL | Status: DC
Start: 2020-11-13 — End: 2020-11-15
  Administered 2020-11-14 – 2020-11-15 (×2): 100 mg via ORAL
  Filled 2020-11-13 (×2): qty 1

## 2020-11-13 MED ORDER — LORAZEPAM 1 MG PO TABS: 0.0000 mg | ORAL_TABLET | Freq: Two times a day (BID) | ORAL | Status: DC

## 2020-11-13 MED ORDER — LORAZEPAM 2 MG/ML IJ SOLN: 0.0000 mg | Freq: Two times a day (BID) | INTRAMUSCULAR | Status: DC

## 2020-11-13 MED ORDER — LORAZEPAM 2 MG/ML IJ SOLN
2.0000 mg | Freq: Once | INTRAMUSCULAR | Status: AC
  Administered 2020-11-13: 2 mg via INTRAVENOUS

## 2020-11-13 MED ORDER — LORAZEPAM 1 MG PO TABS
0.0000 mg | ORAL_TABLET | Freq: Four times a day (QID) | ORAL | Status: DC
  Administered 2020-11-14 – 2020-11-15 (×3): 1 mg via ORAL
  Filled 2020-11-13 (×3): qty 1

## 2020-11-13 MED ORDER — LORAZEPAM 2 MG/ML IJ SOLN
0.0000 mg | Freq: Four times a day (QID) | INTRAMUSCULAR | Status: DC
  Filled 2020-11-13: qty 2

## 2020-11-13 MED ORDER — THIAMINE HCL 100 MG/ML IJ SOLN: 100.0000 mg | Freq: Every day | INTRAMUSCULAR | Status: DC

## 2020-11-13 NOTE — ED Notes (Signed)
Patient is currently sleeping. CIWA score may not be accurate. Will reassess once awake

## 2020-11-13 NOTE — ED Notes (Signed)
Patient is currently sleeping.Will contact TTS once awake.

## 2020-11-13 NOTE — ED Provider Notes (Signed)
  Care assumed from [place], [place].  Please see her full H&P.  In short,  Derrick Cooper is a 60 y.o. male presents for psychosis and SI with a plan to step in front of a train.  He is currently intoxicated and will need TTS when he is sober.    Physical Exam  BP (!) 125/96   Pulse 100   Temp 98.1 F (36.7 C)   Resp 16   SpO2 98%   Physical Exam Vitals and nursing note reviewed.  Constitutional:      General: He is not in acute distress.    Appearance: He is well-developed.  HENT:     Head: Normocephalic.  Eyes:     General: No scleral icterus.    Conjunctiva/sclera: Conjunctivae normal.  Cardiovascular:     Rate and Rhythm: Normal rate.  Pulmonary:     Effort: Pulmonary effort is normal.  Musculoskeletal:        General: Normal range of motion.     Cervical back: Normal range of motion.  Skin:    General: Skin is warm and dry.  Neurological:     Mental Status: He is alert.  Psychiatric:        Attention and Perception: He perceives auditory hallucinations.        Thought Content: Thought content includes suicidal ideation. Thought content includes suicidal plan.     ED Course/Procedures   Clinical Course as of 11/14/20 0249  Fri Nov 13, 2020  2207 Alcohol, Ethyl (B)(!!): 397 Intoxicated - is sobering [HM]    Clinical Course User Index [HM] Torry Istre, Boyd Kerbs    Procedures  MDM   Patient sleeping initially but awakes easily.  He is able to answer questions without difficulty.  Reports that he needs help with his mental health including medications for his mental health.  He reports that he is hearing voices and these are scaring him.  Reports he feels suicidal and would step out in front of a train.  Patient sober enough for TTS evaluation at this time.  2:49 AM TTS completed - recommendation by NP [place] is for Ed obs and psyc eval in the AM.  Pt has been able to eat, drink and walk without difficulty.  Pt is here voluntarily, but if he decides to  leave will need IVC until cleared by psyc.  Suicidal ideation  Alcohol abuse  Psychosis, unspecified psychosis type [facility])     Toyia Jelinek, Boyd Kerbs 11/14/20 0250    Tilden Fossa, MD 11/14/20 2025

## 2020-11-13 NOTE — ED Provider Notes (Signed)
[facility]-EMERGENCY DEPT Provider Note   CSN: [id number] Arrival date & time: 11/13/20  1538     History Chief Complaint  Patient presents with  . Suicidal    Derrick Cooper is a 60 y.o. male.  HPI 60 year old male with a history of DM type II, hep C, HIV, schizophrenia, substance abuse, presents to the ER via [facility].  Found on the side of the road with suicidal ideations or hallucinations.  States he has been drinking and smoking crack cocaine.  He denies taking his psychiatric medicines for the last month.  Arriving acutely psychotic, responding to external stimuli, saying he has "black cats running around his legs".  He states he is scared and does know where he is.    Past Medical History:  Diagnosis Date  . Diabetes mellitus without complication (HCC)   . Hepatitis C   . HIV infection (HCC)   . Schizophrenia (HCC)   . Substance abuse [facility])     Patient Active Problem List   Diagnosis Date Noted  . Alcohol abuse   . Fracture of mandible (HCC)   . Acute respiratory failure with hypoxia (HCC) 12/27/2019  . Lobar pneumonia (HCC) 12/27/2019  . Community acquired pneumonia of right lung 12/27/2019  . Polysubstance abuse (HCC) 12/10/2019  . Homelessness 12/10/2019  . Substance induced mood disorder (HCC) 12/10/2019  . MDD (major depressive disorder), recurrent episode, severe (HCC) 08/09/2018  . Schizoaffective disorder, bipolar type (HCC)   . Health care maintenance 06/26/2018  . Tobacco use 06/26/2018  . Furuncle of right axilla 11/13/2017  . Hyperlipidemia associated with type 2 diabetes mellitus (HCC) 10/25/2017  . 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 HIV infection (HCC) 09/27/2017  . Chronic hepatitis C without hepatic coma (HCC) 09/27/2017  . Acute non-recurrent maxillary sinusitis 09/27/2017  . Schizophrenia (HCC) 09/07/2017  . Controlled diabetes  mellitus with complication, without long-term current use of insulin (HCC) 09/07/2017  . Urinary incontinence 09/07/2017    Past Surgical History:  Procedure Laterality Date  . ORIF MANDIBULAR FRACTURE Left 12/31/2019   Procedure: OPEN REDUCTION INTERNAL FIXATION (ORIF) MANDIBULAR FRACTURE , NASAL  MAXILLO MANDIBULAR FIXATION;  Surgeon: [name], MD;  Location: [facility] OR;  Service: ENT;  Laterality: Left;       Family History  Problem Relation Age of Onset  . Breast cancer Mother   . Lung cancer Father   . Heart attack Father     Social History   Tobacco Use  . Smoking status: Current Every Day Smoker    Packs/day: 0.50    Years: 42.00    Pack years: 21.00    Types: Cigarettes  . Smokeless tobacco: Never Used  Vaping Use  . Vaping Use: Never used  Substance Use Topics  . Alcohol use: Yes    Comment: Clean for 2 months   . Drug use: Yes    Types: Marijuana, Methamphetamines, "Crack" cocaine    Comment: Has used crack in the last month; marajuana  occasionally    Home Medications Prior to Admission medications   Medication Sig Start Date End Date Taking? Authorizing Provider  atorvastatin (LIPITOR) 40 MG tablet Take 40 mg by mouth daily.  06/04/19   [provider]  bictegravir-emtricitabine-tenofovir AF (BIKTARVY) 50-200-25 MG TABS tablet Take 1 tablet by mouth daily. 08/27/20   Veryl Speak, FNP  HYDROcodone-acetaminophen (NORCO/VICODIN) 5-325 MG tablet Take 1-2 tablets by mouth  every 4 (four) hours as needed for moderate pain. Patient not taking: No sig reported 01/03/20   Marinda Elk, MD  hydrOXYzine (VISTARIL) 50 MG capsule Take 50 mg by mouth at bedtime. 06/17/20   [provider]  lisinopril (ZESTRIL) 5 MG tablet Take 5 mg by mouth daily. 01/20/20   [provider]  metFORMIN (GLUCOPHAGE) 500 MG tablet Take 1 tablet (500 mg total) by mouth 2 (two) times daily with a meal. 10/28/19   Malvin Johns, MD  QUEtiapine (SEROQUEL)  200 MG tablet Take 1 tablet (200 mg total) by mouth at bedtime. 10/28/19   Malvin Johns, MD  sulfamethoxazole-trimethoprim (BACTRIM DS) 800-160 MG tablet Take 1 tablet by mouth daily. Patient not taking: Reported on 11/14/2020 08/27/20   Veryl Speak, FNP    Allergies    Patient has no known allergies.  Review of Systems   Review of Systems  Constitutional: Negative for chills and fever.  HENT: Negative for ear pain and sore throat.   Eyes: Negative for pain and visual disturbance.  Respiratory: Negative for cough and shortness of breath.   Cardiovascular: Negative for chest pain and palpitations.  Gastrointestinal: Negative for abdominal pain and vomiting.  Genitourinary: Negative for dysuria and hematuria.  Musculoskeletal: Negative for arthralgias and back pain.  Skin: Negative for color change and rash.  Neurological: Negative for seizures and syncope.  Psychiatric/Behavioral: Positive for behavioral problems, hallucinations, self-injury and suicidal ideas. The patient is nervous/anxious and is hyperactive.   All other systems reviewed and are negative.   Physical Exam Updated Vital Signs BP (!) 119/91   Pulse (!) 111   Temp 97.9 F (36.6 C) (Oral)   Resp 12   SpO2 98%   Physical Exam Vitals and nursing note reviewed.  Constitutional:      Appearance: He is well-developed.  HENT:     Head: Normocephalic and atraumatic.  Eyes:     Conjunctiva/sclera: Conjunctivae normal.  Cardiovascular:     Rate and Rhythm: Normal rate and regular rhythm.     Heart sounds: No murmur heard.   Pulmonary:     Effort: Pulmonary effort is normal. No respiratory distress.     Breath sounds: Normal breath sounds.  Abdominal:     Palpations: Abdomen is soft.     Tenderness: There is no abdominal tenderness.  Musculoskeletal:     Cervical back: Neck supple.  Skin:    General: Skin is warm and dry.  Neurological:     Mental Status: He is alert.  Psychiatric:        Attention and  Perception: He perceives visual hallucinations.        Mood and Affect: Mood is anxious. Affect is tearful.        Speech: Speech is rapid and pressured and slurred.        Behavior: Behavior is agitated and combative.        Thought Content: Thought content is paranoid and delusional. Thought content includes suicidal ideation.        Judgment: Judgment is impulsive.     Comments: Responding to external stimuli, stomping on the ground stating he has black cats running around his legs     ED Results / Procedures / Treatments   Labs (all labs ordered are listed, but only abnormal results are displayed) Labs Reviewed  COMPREHENSIVE METABOLIC PANEL - Abnormal; Notable for the following components:      Result Value   Glucose, Bld 133 (*)  Calcium 8.4 (*)    Total Protein 9.3 (*)    Albumin 3.1 (*)    AST 77 (*)    All other components within normal limits  ETHANOL - Abnormal; Notable for the following components:   Alcohol, Ethyl (B) 397 (*)    All other components within normal limits  CBC WITH DIFFERENTIAL/PLATELET - Abnormal; Notable for the following components:   Hemoglobin 12.8 (*)    HCT 38.6 (*)    Platelets 91 (*)    Neutro Abs 0.8 (*)    Lymphs Abs 4.5 (*)    All other components within normal limits  ACETAMINOPHEN LEVEL - Abnormal; Notable for the following components:   Acetaminophen (Tylenol), Serum <10 (*)    All other components within normal limits  SALICYLATE LEVEL - Abnormal; Notable for the following components:   Salicylate Lvl <7.0 (*)    All other components within normal limits  URINALYSIS, ROUTINE W REFLEX MICROSCOPIC - Abnormal; Notable for the following components:   Specific Gravity, Urine 1.003 (*)    Hgb urine dipstick SMALL (*)    Bacteria, UA RARE (*)    All other components within normal limits  RESP PANEL BY RT-PCR (FLU A&B, COVID) ARPGX2  RAPID URINE DRUG SCREEN, HOSP PERFORMED    EKG EKG Interpretation  Date/Time:  Friday November 13 2020 21:42:51 EDT Ventricular Rate:  75 PR Interval:  136 QRS Duration: 92 QT Interval:  394 QTC Calculation: 439 R Axis:   88 Text Interpretation: Normal sinus rhythm Minimal voltage criteria for LVH, may be normal variant ( Sokolow-Lyon ) Nonspecific ST abnormality Abnormal ECG Confirmed by Tilden Fossa [phone]) on 11/13/2020 9:47:44 PM   Radiology No results found.  Procedures Procedures   Medications Ordered in ED Medications  LORazepam (ATIVAN) injection 2 mg (2 mg Intravenous Given 11/13/20 1617)    ED Course  I have reviewed the triage vital signs and the nursing notes.  Pertinent labs & imaging results that were available during my care of the patient were reviewed by me and considered in my medical decision making (see chart for details).  Clinical Course as of 11/24/20 2137  Fri Nov 13, 2020  2207 Alcohol, Ethyl (B)(!!): 397 Intoxicated - is sobering [HM]    Clinical Course User Index [HM] Muthersbaugh, Boyd Kerbs   MDM Rules/Calculators/A&P                          60 year old male presenting with noncompliance to medications, intoxicated, reportedly smoking crack cocaine.  He is spotting to external stimuli, stating he has cats around his legs and is attempting to stop them out.  He is tearful, states he does not know where he is.  Plan for labs, patient received 2 of Ativan for CIWA protocol and for hallucinations.  Labs reviewed by me, CMP largely unremarkable with some isolated AST elevation likely consistent to recent alcohol use.  CBC largely unremarkable.  Negative Tylenol and salicylate level.  Negative UDS.  Alcohol is 397 on arrival.  Covid is negative.  Patient will need to become more clinically sober, will then require TTS evaluation.  If he attempts to leave, he will require IVC. Final Clinical Impression(s) / ED Diagnoses Final diagnoses:  Suicidal ideation  Alcohol abuse  Psychosis, unspecified psychosis type (HCC)    Rx / DC Orders ED  Discharge Orders         Ordered    Ambulatory referral to Infectious Disease  11/15/20 1041           Leone BrandBelaya, Diamonds Lippard A, PA-C 11/24/20 2138    Tilden Fossaees, Elizabeth, MD 11/30/20 [phone]

## 2020-11-13 NOTE — ED Triage Notes (Signed)
BIBA and [facility]  Per EMS:  Pt coming from side of road with SI, hallucinations. Pt states he is "drunk as shit." Denies med compliance x1 month  130/76 90HR  95% RA  148 BS

## 2020-11-13 NOTE — BH Assessment (Signed)
Called to arrange TTS tele-assessment. Pt is in hallway bed. Secretary states Pt has been given medication and is unlikely to be able to participate in assessment at this time. [facility] will contact TTS when Pt is ready for tele-assessment.   Pamalee Leyden, [facility], [facility] Triage Specialist [phone]

## 2020-11-14 NOTE — ED Provider Notes (Signed)
Emergency Medicine Observation Re-evaluation Note  Derrick Cooper is a 60 y.o. male, seen on rounds today.  Pt initially presented to the ED for complaints of Suicidal Currently, the patient is resting in bed.  Physical Exam  BP (!) 148/99 (BP Location: Left Arm)   Pulse (!) 105   Temp 98.7 F (37.1 C) (Oral)   Resp 16   SpO2 93%  Physical Exam General: no distress Lungs: no distress Psych: calm  ED Course / MDM  EKG:EKG Interpretation  Date/Time:  Friday November 13 2020 21:42:51 EDT Ventricular Rate:  75 PR Interval:  136 QRS Duration: 92 QT Interval:  394 QTC Calculation: 439 R Axis:   88 Text Interpretation: Normal sinus rhythm Minimal voltage criteria for LVH, may be normal variant ( Sokolow-Lyon ) Nonspecific ST abnormality Abnormal ECG Confirmed by Tilden Fossa [phone]) on 11/13/2020 9:47:44 PM   I have reviewed the labs performed to date as well as medications administered while in observation.  Recent changes in the last 24 hours include none.  Plan  Current plan is for psychiatry assessment pending. Patient is under full IVC at this time.   Koleen Distance, MD 11/14/20 [phone]

## 2020-11-14 NOTE — Consult Note (Signed)
Telepsych Consultation   Reason for Consult:  Psych consult Referring Physician:  Trudee Grip PA-C Location of Patient: [facility] Location of Provider: Behavioral [facility] TTS Department  Patient Identification: Derrick Cooper MRN:  [id number] Principal Diagnosis: Substance induced mood disorder (HCC) Diagnosis:  Principal Problem:   Substance induced mood disorder (HCC) Active Problems:   Schizoaffective disorder, bipolar type (HCC)  Total Time spent with patient: 20 minutes  Subjective:   DAMAURI Cooper is a 60 y.o. male patient admitted with . On assessment patient presents lying in bed; appears disoriented, unsteady movement. Coarse visible tremor noted; pt c/o nausea. "I don't feel good. I haven't had a drink and I don't feel good". Patient unable to answer assessment questions, continues to repeat "not feeling good". Remains on CIWA precautions. BAL 397  Based on patient's current presentation, patient to remain overnight for further observation, stabilization and treatment.   HPI:   Derrick Cooper is a 60 year old male patient who presented to [facility] via [facility] after being found on the side of the road with suicidal ideations and hallucinations. Patient admitted to drinking alcohol and smoking crack; BAL 397, UDS (-). Patient has a past psychiatric history of schizophrenia, polysubstance abuse, MDD; past medical history of Hepatitis C, HIV infection.   Past Psychiatric History:   -schizophrenia   -polysubstance abuse  -MDD  Risk to Self:  yes Risk to Others:  yes Prior Inpatient Therapy:  yes Prior Outpatient Therapy:  yes  Past Medical History:  Past Medical History:  Diagnosis Date  . Diabetes mellitus without complication (HCC)   . Hepatitis C   . HIV infection (HCC)   . Schizophrenia (HCC)   . Substance abuse [facility])     Past Surgical History:  Procedure Laterality Date  . ORIF MANDIBULAR FRACTURE Left 12/31/2019   Procedure: OPEN REDUCTION INTERNAL FIXATION (ORIF)  MANDIBULAR FRACTURE , NASAL  MAXILLO MANDIBULAR FIXATION;  Surgeon: [name], MD;  Location: [facility] OR;  Service: ENT;  Laterality: Left;   Family History:  Family History  Problem Relation Age of Onset  . Breast cancer Mother   . Lung cancer Father   . Heart attack Father    Family Psychiatric  History: not noted Social History:  Social History   Substance and Sexual Activity  Alcohol Use Yes   Comment: Clean for 2 months      Social History   Substance and Sexual Activity  Drug Use Yes  . Types: Marijuana, Methamphetamines, "Crack" cocaine   Comment: Has used crack in the last month; marajuana  occasionally    Social History   Socioeconomic History  . Marital status: Single    Spouse name: Not on file  . Number of children: 0  . Years of education: 9  . Highest education level: Not on file  Occupational History  . Occupation: unemployed  Tobacco Use  . Smoking status: Current Every Day Smoker    Packs/day: 0.50    Years: 42.00    Pack years: 21.00    Types: Cigarettes  . Smokeless tobacco: Never Used  Vaping Use  . Vaping Use: Never used  Substance and Sexual Activity  . Alcohol use: Yes    Comment: Clean for 2 months   . Drug use: Yes    Types: Marijuana, Methamphetamines, "Crack" cocaine    Comment: Has used crack in the last month; marajuana  occasionally  . Sexual activity: Not Currently    Partners: Female  Comment: declined condoms 08/27/20  Other Topics Concern  . Not on file  Social History Narrative  . Not on file   Social Determinants of Health   Financial Resource Strain: Not on file  Food Insecurity: Not on file  Transportation Needs: Not on file  Physical Activity: Not on file  Stress: Not on file  Social Connections: Not on file   Additional Social History:   Allergies:  No Known Allergies  Labs:  Results for orders placed or performed during the hospital encounter of 11/13/20 (from the past 48 hour(s))  Comprehensive  metabolic panel     Status: Abnormal   Collection Time: 11/13/20  4:00 PM  Result Value Ref Range   Sodium 141 135 - 145 mmol/L   Potassium 4.0 3.5 - 5.1 mmol/L   Chloride 105 98 - 111 mmol/L   CO2 27 22 - 32 mmol/L   Glucose, Bld 133 (H) 70 - 99 mg/dL    Comment: Glucose reference range applies only to samples taken after fasting for at least 8 hours.   BUN 6 6 - 20 mg/dL   Creatinine, Ser 6.960.79 0.61 - 1.24 mg/dL   Calcium 8.4 (L) 8.9 - 10.3 mg/dL   Total Protein 9.3 (H) 6.5 - 8.1 g/dL   Albumin 3.1 (L) 3.5 - 5.0 g/dL   AST 77 (H) 15 - 41 U/L   ALT 42 0 - 44 U/L   Alkaline Phosphatase 61 38 - 126 U/L   Total Bilirubin 0.3 0.3 - 1.2 mg/dL   GFR, Estimated >29>60 >52>60 mL/min    Comment: (NOTE) Calculated using the CKD-EPI Creatinine Equation (2021)    Anion gap 9 5 - 15    Comment: Performed at [facility], [address] [place], [place] [address]  Ethanol     Status: Abnormal   Collection Time: 11/13/20  4:00 PM  Result Value Ref Range   Alcohol, Ethyl (B) 397 (HH) <10 mg/dL    Comment: CRITICAL RESULT CALLED TO, READ BACK BY AND VERIFIED WITH: LEONARD,S. RN @1703  ON 04.15.2022 BY COHEN,K (NOTE) Lowest detectable limit for serum alcohol is 10 mg/dL.  For medical purposes only. Performed at [facility], [address] [place], [place] [address]   Urine rapid drug screen (hosp performed)     Status: None   Collection Time: 11/13/20  4:00 PM  Result Value Ref Range   Opiates NONE DETECTED NONE DETECTED   Cocaine NONE DETECTED NONE DETECTED   Benzodiazepines NONE DETECTED NONE DETECTED   Amphetamines NONE DETECTED NONE DETECTED   Tetrahydrocannabinol NONE DETECTED NONE DETECTED   Barbiturates NONE DETECTED NONE DETECTED    Comment: (NOTE) DRUG SCREEN FOR MEDICAL PURPOSES ONLY.  IF CONFIRMATION IS NEEDED FOR ANY PURPOSE, NOTIFY LAB WITHIN 5 DAYS.  LOWEST DETECTABLE LIMITS FOR URINE DRUG SCREEN Drug Class                     Cutoff  (ng/mL) Amphetamine and metabolites    1000 Barbiturate and metabolites    200 Benzodiazepine                 200 Tricyclics and metabolites     300 Opiates and metabolites        300 Cocaine and metabolites        300 THC  50 Performed at [facility], [address], [place], [place] [address]   CBC with Diff     Status: Abnormal   Collection Time: 11/13/20  4:00 PM  Result Value Ref Range   WBC 5.8 4.0 - 10.5 K/uL   RBC 4.23 4.22 - 5.81 MIL/uL   Hemoglobin 12.8 (L) 13.0 - 17.0 g/dL   HCT 57.2 (L) 62.0 - 35.5 %   MCV 91.3 80.0 - 100.0 fL   MCH 30.3 26.0 - 34.0 pg   MCHC 33.2 30.0 - 36.0 g/dL   RDW 97.4 16.3 - 84.5 %   Platelets 91 (L) 150 - 400 K/uL    Comment: SPECIMEN CHECKED FOR CLOTS Immature Platelet Fraction may be clinically indicated, consider ordering this additional test [id number] CONSISTENT WITH PREVIOUS RESULT REPEATED TO VERIFY    nRBC 0.0 0.0 - 0.2 %   Neutrophils Relative % 13 %   Neutro Abs 0.8 (L) 1.7 - 7.7 K/uL   Lymphocytes Relative 77 %   Lymphs Abs 4.5 (H) 0.7 - 4.0 K/uL   Monocytes Relative 8 %   Monocytes Absolute 0.5 0.1 - 1.0 K/uL   Eosinophils Relative 1 %   Eosinophils Absolute 0.0 0.0 - 0.5 K/uL   Basophils Relative 1 %   Basophils Absolute 0.0 0.0 - 0.1 K/uL   Immature Granulocytes 0 %   Abs Immature Granulocytes 0.01 0.00 - 0.07 K/uL   Reactive, Benign Lymphocytes PRESENT     Comment: Performed at [facility], [address] [place], [place] [address]  Acetaminophen level     Status: Abnormal   Collection Time: 11/13/20  4:00 PM  Result Value Ref Range   Acetaminophen (Tylenol), Serum <10 (L) 10 - 30 ug/mL    Comment: (NOTE) Therapeutic concentrations vary significantly. A range of 10-30 ug/mL  may be an effective concentration for many patients. However, some  are best treated at concentrations outside of this range. Acetaminophen concentrations >150 ug/mL at 4 hours  after ingestion  and >50 ug/mL at 12 hours after ingestion are often associated with  toxic reactions.  Performed at [facility], [address] [place], [place] [address]   Salicylate level     Status: Abnormal   Collection Time: 11/13/20  4:00 PM  Result Value Ref Range   Salicylate Lvl <7.0 (L) 7.0 - 30.0 mg/dL    Comment: Performed at [facility], [address] [place], [place] [address]  Urinalysis, Routine w reflex microscopic     Status: Abnormal   Collection Time: 11/13/20  4:00 PM  Result Value Ref Range   Color, Urine YELLOW YELLOW   APPearance CLEAR CLEAR   Specific Gravity, Urine 1.003 (L) 1.005 - 1.030   pH 6.0 5.0 - 8.0   Glucose, UA NEGATIVE NEGATIVE mg/dL   Hgb urine dipstick SMALL (A) NEGATIVE   Bilirubin Urine NEGATIVE NEGATIVE   Ketones, ur NEGATIVE NEGATIVE mg/dL   Protein, ur NEGATIVE NEGATIVE mg/dL   Nitrite NEGATIVE NEGATIVE   Leukocytes,Ua NEGATIVE NEGATIVE   WBC, UA 0-5 0 - 5 WBC/hpf   Bacteria, UA RARE (A) NONE SEEN    Comment: Performed at [facility], [address], [place], [place] [address]  Resp Panel by RT-PCR (Flu A&B, Covid) Nasopharyngeal Swab     Status: None   Collection Time: 11/13/20  4:40 PM   Specimen: Nasopharyngeal Swab; Nasopharyngeal(NP) swabs in vial transport medium  Result Value Ref Range  SARS Coronavirus 2 by RT PCR NEGATIVE NEGATIVE    Comment: (NOTE) SARS-CoV-2 target nucleic acids are NOT DETECTED.  The SARS-CoV-2 RNA is generally detectable in upper respiratory specimens during the acute phase of infection. The lowest concentration of SARS-CoV-2 viral copies this assay can detect is 138 copies/mL. A negative result does not preclude SARS-Cov-2 infection and should not be used as the sole basis for treatment or other patient management decisions. A negative result may occur with  improper specimen collection/handling, submission of specimen other than  nasopharyngeal swab, presence of viral mutation(s) within the areas targeted by this assay, and inadequate number of viral copies(<138 copies/mL). A negative result must be combined with clinical observations, patient history, and epidemiological information. The expected result is Negative.  Fact Sheet for Patients:  BloggerCourse.com  Fact Sheet for Healthcare Providers:  SeriousBroker.it  This test is no t yet approved or cleared by the [place] FDA and  has been authorized for detection and/or diagnosis of SARS-CoV-2 by FDA under an Emergency Use Authorization (EUA). This EUA will remain  in effect (meaning this test can be used) for the duration of the COVID-19 declaration under Section 564(b)(1) of the Act, 21 U.S.C.section 360bbb-3(b)(1), unless the authorization is terminated  or revoked sooner.       Influenza A by PCR NEGATIVE NEGATIVE   Influenza B by PCR NEGATIVE NEGATIVE    Comment: (NOTE) The Xpert Xpress SARS-CoV-2/FLU/RSV plus assay is intended as an aid in the diagnosis of influenza from Nasopharyngeal swab specimens and should not be used as a sole basis for treatment. Nasal washings and aspirates are unacceptable for Xpert Xpress SARS-CoV-2/FLU/RSV testing.  Fact Sheet for Patients: BloggerCourse.com  Fact Sheet for Healthcare Providers: SeriousBroker.it  This test is not yet approved or cleared by the [place] FDA and has been authorized for detection and/or diagnosis of SARS-CoV-2 by FDA under an Emergency Use Authorization (EUA). This EUA will remain in effect (meaning this test can be used) for the duration of the COVID-19 declaration under Section 564(b)(1) of the Act, 21 U.S.C. section 360bbb-3(b)(1), unless the authorization is terminated or revoked.  Performed at [facility], [address] [place], [place]  [address]     Medications:  Current Facility-Administered Medications  Medication Dose Route Frequency Provider Last Rate Last Admin  . LORazepam (ATIVAN) injection 0-4 mg  0-4 mg Intravenous Q6H Belaya, Maria A, PA-C       Or  . LORazepam (ATIVAN) tablet 0-4 mg  0-4 mg Oral Q6H Belaya, Maria A, PA-C   1 mg at 11/14/20 0943  . [START ON 11/16/2020] LORazepam (ATIVAN) injection 0-4 mg  0-4 mg Intravenous Q12H Belaya, Maria A, PA-C       Or  . Melene Muller ON 11/16/2020] LORazepam (ATIVAN) tablet 0-4 mg  0-4 mg Oral Q12H Belaya, Maria A, PA-C      . thiamine tablet 100 mg  100 mg Oral Daily Trudee Grip A, PA-C   100 mg at 11/14/20 0944   Or  . thiamine (B-1) injection 100 mg  100 mg Intravenous Daily Mare Ferrari, PA-C       Current Outpatient Medications  Medication Sig Dispense Refill  . atorvastatin (LIPITOR) 40 MG tablet Take 40 mg by mouth daily.     . bictegravir-emtricitabine-tenofovir AF (BIKTARVY) 50-200-25 MG TABS tablet Take 1 tablet by mouth daily. 30 tablet 0  . HYDROcodone-acetaminophen (NORCO/VICODIN) 5-325 MG tablet Take 1-2 tablets by mouth every 4 (four) hours as  needed for moderate pain. (Patient not taking: No sig reported) 30 tablet 0  . hydrOXYzine (VISTARIL) 50 MG capsule Take 50 mg by mouth at bedtime.    Marland Kitchen lisinopril (ZESTRIL) 5 MG tablet Take 5 mg by mouth daily.    . metFORMIN (GLUCOPHAGE) 500 MG tablet Take 1 tablet (500 mg total) by mouth 2 (two) times daily with a meal. 60 tablet 3  . QUEtiapine (SEROQUEL) 200 MG tablet Take 1 tablet (200 mg total) by mouth at bedtime. 30 tablet 2  . sulfamethoxazole-trimethoprim (BACTRIM DS) 800-160 MG tablet Take 1 tablet by mouth daily. (Patient not taking: Reported on 11/14/2020) 30 tablet 3   Musculoskeletal: Strength & Muscle Tone: abnormal and decreased Gait & Station: unable to assess due to current state Patient leans: N/A  Psychiatric Specialty Exam: Physical Exam Vitals and nursing note reviewed.  Psychiatric:         Attention and Perception: He is inattentive.        Mood and Affect: Affect is inappropriate.        Speech: Speech is tangential.        Thought Content: Thought content is delusional.        Cognition and Memory: Cognition is impaired. Memory is impaired.        Judgment: Judgment is impulsive and inappropriate.     Review of Systems  Psychiatric/Behavioral: Positive for confusion, decreased concentration and hallucinations.    Blood pressure (!) 148/99, pulse (!) 105, temperature 98.7 F (37.1 C), temperature source Oral, resp. rate 16, SpO2 93 %.There is no height or weight on file to calculate BMI.  General Appearance: Disheveled  Eye Contact:  Fair  Speech:  Garbled  Volume:  Increased  Mood:  Dysphoric  Affect:  Inappropriate  Thought Process:  Disorganized  Orientation:  Other:  unable to fully assess  Thought Content:  Illogical  Suicidal Thoughts:  unable to fully assess  Homicidal Thoughts:  unable to fully assess  Memory:  unable to fully assess  Judgement:  Impaired  Insight:  Lacking and Shallow  Psychomotor Activity:  Decreased  Concentration:  Concentration: Poor and Attention Span: Poor  Recall:  [employer] of Knowledge:  Fair  Language:  Poor  Akathisia:  NA  Handed:    AIMS (if indicated):     Assets:  Resilience  ADL's:  Impaired  Cognition:  Impaired,  Mild  Sleep:      Treatment Plan Summary: Daily contact with patient to assess and evaluate symptoms and progress in treatment, Medication management and Plan continue to monitor and observe, reassess by psychiatry in the morning.   This service was provided via telemedicine using a 2-way, interactive audio and video technology.  Names of all persons participating in this telemedicine service and their role in this encounter. Name: Maxie Barb Role: PMHNP  Name: Antonieta Pert Role: Attending MD  Name: Derrick Cooper Role: patient  Name:  Role:     Loletta Parish,  NP 11/14/2020 5:44 PM
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
## 2020-11-15 NOTE — ED Notes (Signed)
Patient provided with breakfast tray at this time.

## 2020-11-15 NOTE — ED Notes (Signed)
Patient ambulated to bathroom, stable gait 

## 2020-11-15 NOTE — ED Provider Notes (Signed)
Emergency Medicine Observation Re-evaluation Note  [facility] is a 60 y.o. male, seen on rounds today.  Pt initially presented to the ED for complaints of Suicidal Currently, the patient is awaiting reassessment by TTS this morning.  Physical Exam  BP (!) 128/99   Pulse (!) 104   Temp 98.4 F (36.9 C) (Oral)   Resp 20   SpO2 98%  Physical Exam General: Resting comfortably with no complaint Lungs: Clear Psych: Happy and pleasantly sitting at rest  ED Course / MDM  EKG:EKG Interpretation  Date/Time:  Friday November 13 2020 21:42:51 EDT Ventricular Rate:  75 PR Interval:  136 QRS Duration: 92 QT Interval:  394 QTC Calculation: 439 R Axis:   88 Text Interpretation: Normal sinus rhythm Minimal voltage criteria for LVH, may be normal variant ( Sokolow-Lyon ) Nonspecific ST abnormality Abnormal ECG Confirmed by Tilden Fossa [phone]) on 11/13/2020 9:47:44 PM   I have reviewed the labs performed to date as well as medications administered while in observation.  Recent changes in the last 24 hours include none.  Plan  Current plan is TTS on this morning and feel he is now safe for discharge home.  He will follow-up with PCP, his infectious disease team, and outpatient mental health team. Patient is not under full IVC at this time.   Mariette Cowley, Canary Brim, MD 11/15/20 1041

## 2020-11-15 NOTE — Consult Note (Signed)
Telepsych Consultation   Reason for Consult:  Psych consult Referring Physician:  Trudee Grip PA-C Location of Patient: [facility] Location of Provider: Behavioral Health TTS Department  Patient Identification: Derrick Cooper MRN:  [id number] Principal Diagnosis: Substance induced mood disorder (HCC) Diagnosis:  Principal Problem:   Substance induced mood disorder (HCC) Active Problems:   Schizoaffective disorder, bipolar type (HCC)   Total Time spent with patient: 20 minutes  Subjective:   Derrick Cooper is a 60 y.o. male patient admitted with substance induced mood disorder.  Patient presents calm and cooperative; lying in bed. Normal movement. No coarse tremor noted. Alert, oriented. Patient states he moved to [place] from [place] to help care for his mother in 2016-12-27 who has now passed away; states he has a twin brother and 4 other siblings who he has "burned bridges with" and is currently homeless. 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   Patient denies any active suicidal or homicidal ideations; endorses some depression related to current life conditions. Denies any auditory or visual hallucinations and does not appear to be responding to any external/internal stimuli at this time. Patient contracts for safety and denies any means or intent to harm himself or others. Patient has expressed a readiness for change and interest in following up with outpatient resources to address his psychiatric and substance abuse needs. Provider discussed services provided by [facility], [facility], and [facility]; information to be included in AVS.   HPI:   Derrick Cooper is a 60 year old male patient who presented to [facility] via [facility] after being found on the side of the road with suicidal ideations and hallucinations. Patient admitted to drinking alcohol and smoking crack; BAL 397,  UDS (-). Patient has a past psychiatric history of schizophrenia, polysubstance abuse, MDD; past medical history of Hepatitis C, HIV infection.  Past Psychiatric History:   -schizophrenia  -polysubstance abuse  -MDD  Risk to Self:  no Risk to Others:   no Prior Inpatient Therapy:  no Prior Outpatient Therapy:  no  Past Medical History:  Past Medical History:  Diagnosis Date  . Diabetes mellitus without complication (HCC)   . Hepatitis C   . HIV infection (HCC)   . Schizophrenia (HCC)   . Substance abuse [facility])     Past Surgical History:  Procedure Laterality Date  . ORIF MANDIBULAR FRACTURE Left 12/31/2019   Procedure: OPEN REDUCTION INTERNAL FIXATION (ORIF) MANDIBULAR FRACTURE , NASAL  MAXILLO MANDIBULAR FIXATION;  Surgeon: [name], MD;  Location: [facility] OR;  Service: ENT;  Laterality: Left;   Family History:  Family History  Problem Relation Age of Onset  . Breast cancer Mother   . Lung cancer Father   . Heart attack Father    Family Psychiatric  History: not noted Social History:  Social History   Substance and Sexual Activity  Alcohol Use Yes   Comment: Clean for 2 months      Social History   Substance and Sexual Activity  Drug Use Yes  . Types: Marijuana, Methamphetamines, "Crack" cocaine   Comment: Has used crack in the last month; marajuana  occasionally    Social History   Socioeconomic History  . Marital status: Single    Spouse name: Not on file  . Number of children: 0  . Years of education: 9  . Highest education  level: Not on file  Occupational History  . Occupation: unemployed  Tobacco Use  . Smoking status: Current Every Day Smoker    Packs/day: 0.50    Years: 42.00    Pack years: 21.00    Types: Cigarettes  . Smokeless tobacco: Never Used  Vaping Use  . Vaping Use: Never used  Substance and Sexual Activity  . Alcohol use: Yes    Comment: Clean for 2 months   . Drug use: Yes    Types: Marijuana, Methamphetamines, "Crack"  cocaine    Comment: Has used crack in the last month; marajuana  occasionally  . Sexual activity: Not Currently    Partners: Female    Comment: declined condoms 08/27/20  Other Topics Concern  . Not on file  Social History Narrative  . Not on file   Social Determinants of Health   Financial Resource Strain: Not on file  Food Insecurity: Not on file  Transportation Needs: Not on file  Physical Activity: Not on file  Stress: Not on file  Social Connections: Not on file   Additional Social History:   Allergies:  No Known Allergies  Labs:  Results for orders placed or performed during the hospital encounter of 11/13/20 (from the past 48 hour(s))  Comprehensive metabolic panel     Status: Abnormal   Collection Time: 11/13/20  4:00 PM  Result Value Ref Range   Sodium 141 135 - 145 mmol/L   Potassium 4.0 3.5 - 5.1 mmol/L   Chloride 105 98 - 111 mmol/L   CO2 27 22 - 32 mmol/L   Glucose, Bld 133 (H) 70 - 99 mg/dL    Comment: Glucose reference range applies only to samples taken after fasting for at least 8 hours.   BUN 6 6 - 20 mg/dL   Creatinine, Ser 2.95 0.61 - 1.24 mg/dL   Calcium 8.4 (L) 8.9 - 10.3 mg/dL   Total Protein 9.3 (H) 6.5 - 8.1 g/dL   Albumin 3.1 (L) 3.5 - 5.0 g/dL   AST 77 (H) 15 - 41 U/L   ALT 42 0 - 44 U/L   Alkaline Phosphatase 61 38 - 126 U/L   Total Bilirubin 0.3 0.3 - 1.2 mg/dL   GFR, Estimated >62 >13 mL/min    Comment: (NOTE) Calculated using the CKD-EPI Creatinine Equation (2021)    Anion gap 9 5 - 15    Comment: Performed at [facility], [address], [place], [place] [address]  Ethanol     Status: Abnormal   Collection Time: 11/13/20  4:00 PM  Result Value Ref Range   Alcohol, Ethyl (B) 397 (HH) <10 mg/dL    Comment: CRITICAL RESULT CALLED TO, READ BACK BY AND VERIFIED WITH: LEONARD,S. RN  ON 04.15.2022 BY COHEN,K (NOTE) Lowest detectable limit for serum alcohol is 10 mg/dL.  For medical purposes only. Performed at  [facility], [address] [place], [place] [address]   Urine rapid drug screen (hosp performed)     Status: None   Collection Time: 11/13/20  4:00 PM  Result Value Ref Range   Opiates NONE DETECTED NONE DETECTED   Cocaine NONE DETECTED NONE DETECTED   Benzodiazepines NONE DETECTED NONE DETECTED   Amphetamines NONE DETECTED NONE DETECTED   Tetrahydrocannabinol NONE DETECTED NONE DETECTED   Barbiturates NONE DETECTED NONE DETECTED    Comment: (NOTE) DRUG SCREEN FOR MEDICAL PURPOSES ONLY.  IF CONFIRMATION IS NEEDED FOR ANY PURPOSE, NOTIFY LAB WITHIN 5 DAYS.  LOWEST  DETECTABLE LIMITS FOR URINE DRUG SCREEN Drug Class                     Cutoff (ng/mL) Amphetamine and metabolites    1000 Barbiturate and metabolites    200 Benzodiazepine                 200 Tricyclics and metabolites     300 Opiates and metabolites        300 Cocaine and metabolites        300 THC                            50 Performed at [facility], [address] [place], [place] [address]   CBC with Diff     Status: Abnormal   Collection Time: 11/13/20  4:00 PM  Result Value Ref Range   WBC 5.8 4.0 - 10.5 K/uL   RBC 4.23 4.22 - 5.81 MIL/uL   Hemoglobin 12.8 (L) 13.0 - 17.0 g/dL   HCT 82.938.6 (L) 56.239.0 - 13.052.0 %   MCV 91.3 80.0 - 100.0 fL   MCH 30.3 26.0 - 34.0 pg   MCHC 33.2 30.0 - 36.0 g/dL   RDW 86.514.8 78.411.5 - 69.615.5 %   Platelets 91 (L) 150 - 400 K/uL    Comment: SPECIMEN CHECKED FOR CLOTS Immature Platelet Fraction may be clinically indicated, consider ordering this additional test [id number] CONSISTENT WITH PREVIOUS RESULT REPEATED TO VERIFY    nRBC 0.0 0.0 - 0.2 %   Neutrophils Relative % 13 %   Neutro Abs 0.8 (L) 1.7 - 7.7 K/uL   Lymphocytes Relative 77 %   Lymphs Abs 4.5 (H) 0.7 - 4.0 K/uL   Monocytes Relative 8 %   Monocytes Absolute 0.5 0.1 - 1.0 K/uL   Eosinophils Relative 1 %   Eosinophils Absolute 0.0 0.0 - 0.5 K/uL   Basophils Relative 1 %    Basophils Absolute 0.0 0.0 - 0.1 K/uL   Immature Granulocytes 0 %   Abs Immature Granulocytes 0.01 0.00 - 0.07 K/uL   Reactive, Benign Lymphocytes PRESENT     Comment: Performed at [facility], [address] [place], [place] [address]  Acetaminophen level     Status: Abnormal   Collection Time: 11/13/20  4:00 PM  Result Value Ref Range   Acetaminophen (Tylenol), Serum <10 (L) 10 - 30 ug/mL    Comment: (NOTE) Therapeutic concentrations vary significantly. A range of 10-30 ug/mL  may be an effective concentration for many patients. However, some  are best treated at concentrations outside of this range. Acetaminophen concentrations >150 ug/mL at 4 hours after ingestion  and >50 ug/mL at 12 hours after ingestion are often associated with  toxic reactions.  Performed at [facility], [address] [place], [place] [address]   Salicylate level     Status: Abnormal   Collection Time: 11/13/20  4:00 PM  Result Value Ref Range   Salicylate Lvl <7.0 (L) 7.0 - 30.0 mg/dL    Comment: Performed at [facility], [address] [place], [place] [address]  Urinalysis, Routine w reflex microscopic     Status: Abnormal   Collection Time: 11/13/20  4:00 PM  Result Value Ref Range   Color, Urine YELLOW YELLOW   APPearance CLEAR CLEAR   Specific Gravity, Urine 1.003 (L) 1.005 - 1.030   pH 6.0 5.0 - 8.0  Glucose, UA NEGATIVE NEGATIVE mg/dL   Hgb urine dipstick SMALL (A) NEGATIVE   Bilirubin Urine NEGATIVE NEGATIVE   Ketones, ur NEGATIVE NEGATIVE mg/dL   Protein, ur NEGATIVE NEGATIVE mg/dL   Nitrite NEGATIVE NEGATIVE   Leukocytes,Ua NEGATIVE NEGATIVE   WBC, UA 0-5 0 - 5 WBC/hpf   Bacteria, UA RARE (A) NONE SEEN    Comment: Performed at [facility], [address], [place], [place] [address]  Resp Panel by RT-PCR (Flu A&B, Covid) Nasopharyngeal Swab     Status: None   Collection Time: 11/13/20  4:40 PM    Specimen: Nasopharyngeal Swab; Nasopharyngeal(NP) swabs in vial transport medium  Result Value Ref Range   SARS Coronavirus 2 by RT PCR NEGATIVE NEGATIVE    Comment: (NOTE) SARS-CoV-2 target nucleic acids are NOT DETECTED.  The SARS-CoV-2 RNA is generally detectable in upper respiratory specimens during the acute phase of infection. The lowest concentration of SARS-CoV-2 viral copies this assay can detect is 138 copies/mL. A negative result does not preclude SARS-Cov-2 infection and should not be used as the sole basis for treatment or other patient management decisions. A negative result may occur with  improper specimen collection/handling, submission of specimen other than nasopharyngeal swab, presence of viral mutation(s) within the areas targeted by this assay, and inadequate number of viral copies(<138 copies/mL). A negative result must be combined with clinical observations, patient history, and epidemiological information. The expected result is Negative.  Fact Sheet for Patients:  BloggerCourse.com  Fact Sheet for Healthcare Providers:  SeriousBroker.it  This test is no t yet approved or cleared by the [place] FDA and  has been authorized for detection and/or diagnosis of SARS-CoV-2 by FDA under an Emergency Use Authorization (EUA). This EUA will remain  in effect (meaning this test can be used) for the duration of the COVID-19 declaration under Section 564(b)(1) of the Act, 21 U.S.C.section 360bbb-3(b)(1), unless the authorization is terminated  or revoked sooner.       Influenza A by PCR NEGATIVE NEGATIVE   Influenza B by PCR NEGATIVE NEGATIVE    Comment: (NOTE) The Xpert Xpress SARS-CoV-2/FLU/RSV plus assay is intended as an aid in the diagnosis of influenza from Nasopharyngeal swab specimens and should not be used as a sole basis for treatment. Nasal washings and aspirates are unacceptable for Xpert Xpress  SARS-CoV-2/FLU/RSV testing.  Fact Sheet for Patients: BloggerCourse.com  Fact Sheet for Healthcare Providers: SeriousBroker.it  This test is not yet approved or cleared by the [place] FDA and has been authorized for detection and/or diagnosis of SARS-CoV-2 by FDA under an Emergency Use Authorization (EUA). This EUA will remain in effect (meaning this test can be used) for the duration of the COVID-19 declaration under Section 564(b)(1) of the Act, 21 U.S.C. section 360bbb-3(b)(1), unless the authorization is terminated or revoked.  Performed at [facility], [address] [place], [place] [address]    Medications:  Current Facility-Administered Medications  Medication Dose Route Frequency Provider Last Rate Last Admin  . LORazepam (ATIVAN) injection 0-4 mg  0-4 mg Intravenous Q6H Belaya, Maria A, PA-C       Or  . LORazepam (ATIVAN) tablet 0-4 mg  0-4 mg Oral Q6H Belaya, Maria A, PA-C   1 mg at 11/15/20 0946  . [START ON 11/16/2020] LORazepam (ATIVAN) injection 0-4 mg  0-4 mg Intravenous Q12H Belaya, Maria A, PA-C       Or  . Melene Muller ON 11/16/2020] LORazepam (ATIVAN) tablet 0-4 mg  0-4 mg Oral Q12H Belaya, Maria A, PA-C      . thiamine tablet 100 mg  100 mg Oral Daily Trudee Grip A, PA-C   100 mg at 11/15/20 6295   Or  . thiamine (B-1) injection 100 mg  100 mg Intravenous Daily Mare Ferrari, PA-C       Current Outpatient Medications  Medication Sig Dispense Refill  . atorvastatin (LIPITOR) 40 MG tablet Take 40 mg by mouth daily.     . bictegravir-emtricitabine-tenofovir AF (BIKTARVY) 50-200-25 MG TABS tablet Take 1 tablet by mouth daily. 30 tablet 0  . HYDROcodone-acetaminophen (NORCO/VICODIN) 5-325 MG tablet Take 1-2 tablets by mouth every 4 (four) hours as needed for moderate pain. (Patient not taking: No sig reported) 30 tablet 0  . hydrOXYzine (VISTARIL) 50 MG capsule Take 50 mg by mouth at  bedtime.    Marland Kitchen lisinopril (ZESTRIL) 5 MG tablet Take 5 mg by mouth daily.    . metFORMIN (GLUCOPHAGE) 500 MG tablet Take 1 tablet (500 mg total) by mouth 2 (two) times daily with a meal. 60 tablet 3  . QUEtiapine (SEROQUEL) 200 MG tablet Take 1 tablet (200 mg total) by mouth at bedtime. 30 tablet 2  . sulfamethoxazole-trimethoprim (BACTRIM DS) 800-160 MG tablet Take 1 tablet by mouth daily. (Patient not taking: Reported on 11/14/2020) 30 tablet 3   Musculoskeletal: Strength & Muscle Tone: within normal limits Gait & Station: normal Patient leans: N/A  Psychiatric Specialty Exam: Physical Exam Vitals and nursing note reviewed.  Psychiatric:        Attention and Perception: Attention and perception normal.        Mood and Affect: Mood and affect normal.        Speech: Speech normal.        Behavior: Behavior normal. Behavior is cooperative.        Thought Content: Thought content normal.        Cognition and Memory: Cognition and memory normal.        Judgment: Judgment normal.     Review of Systems  Psychiatric/Behavioral: Negative.   All other systems reviewed and are negative.   Blood pressure (!) 119/91, pulse (!) 111, temperature 97.9 F (36.6 C), temperature source Oral, resp. rate 12, SpO2 98 %.There is no height or weight on file to calculate BMI.  General Appearance: Casual  Eye Contact:  Fair  Speech:  Clear and Coherent and Garbled  Volume:  Normal  Mood:  Euthymic  Affect:  Congruent  Thought Process:  Coherent and Goal Directed  Orientation:  Full (Time, Place, and Person)  Thought Content:  WDL and Logical  Suicidal Thoughts:  No  Homicidal Thoughts:  No  Memory:  Immediate;   Fair Recent;   Fair  Judgement:  Fair  Insight:  Fair and Present  Psychomotor Activity:  Normal  Concentration:  Concentration: Fair and Attention Span: Fair  Recall:  [employer] of Knowledge:  Fair  Language:  Fair  Akathisia:  NA  Handed:    AIMS (if indicated):     Assets:   Communication Skills Desire for Improvement Resilience  ADL's:  Intact  Cognition:  WNL  Sleep:      Treatment Plan Summary: Plan discharge patient with outpatient resources to [facility], [facility], [facility] for individual follow up.   Disposition: No evidence of imminent risk to self or others at present.   Patient does not meet criteria for psychiatric inpatient admission. Refer to IOP. Discussed crisis plan, support  from social network, calling 911, coming to the Emergency Department, and calling Suicide Hotline.  This service was provided via telemedicine using a 2-way, interactive audio and video technology.  Names of all persons participating in this telemedicine service and their role in this encounter. Name: Maxie Barb Role: PMHNP  Name: Antonieta Pert Role: Attending MD  Name: Derrick Cooper Role: patient  Name:  Role:     Loletta Parish, NP 11/15/2020 10:03 AM

## 2020-11-15 NOTE — Progress Notes (Signed)
CSW added the following resources listed below, for the patient through AVS at as request by Maxie Barb, NP via secure chat.  Listed Below are mental health and Substance abuse Resources:   [facility]-will provide timely access to mental health services for children and adolescents (4-17) and adults presenting in a mental health crisis. The program is designed for those who need urgent Behavioral Health or Substance Use treatment and are not experiencing a medical crisis that would typically require an emergency room visit.    [address] [place], [place] [address] Phone: [phone] [email or web address]   The [facility] will also offer the following outpatient services: (Monday through Friday 8am-5pm)    Partial Hospitalization Program (PHP)  Substance Abuse Intensive Outpatient Program (SA-IOP)  Group Therapy  Medication Management  Peer Living Room   We also provide (24/7):    Assessments: Our mental health clinician and providers will conduct a focused mental health evaluation, assessing for immediate safety concerns and further mental health needs.   Referral: Our team will provide resources and help connect to community based mental health treatment, when indicated, including psychotherapy, psychiatry, and other specialized behavioral health or substance use disorder services (for those not already in treatment).   Transitional Care: Our team providers in person bridging and/or telphonic follow-up during the patient's transition to outpatient services.     Homeless Shelter List:   [facility] [place] [place]) [address] [place], [place] Phone: [phone]   [facility] has been providing emergency shelter to those in need of a permanent residence for over 35 years. The [employer] [facility] shelter plays an important role in our community.    There are many life events  that can pull someone into a downward spiral towards poverty that is very difficult to get out of. Homelessness is a problem that can affect anyone of [place]. [facility] [facility] is a safe and comforting place to stay, especially if you have experienced the hardship of street life.    [facility] provides a single bed and bedding to 100 adult men and women. The shelter welcomes all who are in need of housing, no one in real need is turned away unless space is not available.    While staying at [facility], guests are offered more than just a bed for a night. Hot meals are provided and every guest has access to case management services. Case managers provide assistance with finding housing, employment, or other services that will help them gain stability. Continuous stay is based on availability, capacity, and progress towards goals.    To contact the front desk of The [facility] please call   [phone] ext [phone] or ext. [phone].   [facility] [address], [place], [place] [address] Phone: [phone]   [facility]Women only) [address] [place], [place] [address] Phone: [phone]   [facility] [address], [place] [address] Phone: [phone]   [facility]: [phone]. [address] [place], [place] [address] Phone: [phone]   [facility] [address] [place], [place] [address] Check in at 6:00PM for placement at a local shelter) Phone: [phone]  [facility]  At the [facility], we invite people experiencing -- or at risk for -- homelessness to be part of a supportive community free of barriers. Working alongside our guests, we provide services  that help our [facility] neighbors reconnect with their own lives and with the community at large. The [employer] promotes dignity, respect, connection and empowerment in  an environment where trust and relationships matter. We're engaging those we serve in building their own progress toward a life without homelessness.  Phone: [phone]  Address: [address], [place] [address]     [facility])  Address: [address] [place], [place], [place] [address] Hours:   Open 24 hours Phone: [phone]   Crissie Reese, MSW, LCSW-A, LCAS-A Phone: [phone] Disposition/TOC

## 2020-11-15 NOTE — ED Notes (Signed)
TTS monitor placed at bedside at this time

## 2020-11-15 NOTE — Discharge Instructions (Signed)
Listed Below are mental health and Substance abuse Resources:   [facility]-will provide timely access to mental health services for children and adolescents (4-17) and adults presenting in a mental health crisis. The program is designed for those who need urgent Behavioral Health or Substance Use treatment and are not experiencing a medical crisis that would typically require an emergency room visit.    [address] [place], [place] [address] Phone: [phone] [email or web address]   The [facility] will also offer the following outpatient services: (Monday through Friday 8am-5pm)    Partial Hospitalization Program (PHP)  Substance Abuse Intensive Outpatient Program (SA-IOP)  Group Therapy  Medication Management  Peer Living Room   We also provide (24/7):    Assessments: Our mental health clinician and providers will conduct a focused mental health evaluation, assessing for immediate safety concerns and further mental health needs.   Referral: Our team will provide resources and help connect to community based mental health treatment, when indicated, including psychotherapy, psychiatry, and other specialized behavioral health or substance use disorder services (for those not already in treatment).   Transitional Care: Our team providers in person bridging and/or telphonic follow-up during the patient's transition to outpatient services.     Homeless Shelter List:   [facility] [place]) [address] [place], [place] Phone: [phone]   [facility] has been providing emergency shelter to those in need of a permanent residence for over 35 years. The [employer] shelter plays an important role in our community.    There are many life events that can pull someone into a downward spiral towards poverty that is very difficult to get out of. Homelessness is a problem that can affect  anyone of [place]. [employer] is a safe and comforting place to stay, especially if you have experienced the hardship of street life.    [employer] provides a single bed and bedding to 100 adult men and women. The shelter welcomes all who are in need of housing, no one in real need is turned away unless space is not available.    While staying at [facility], guests are offered more than just a bed for a night. Hot meals are provided and every guest has access to case management services. Case managers provide assistance with finding housing, employment, or other services that will help them gain stability. Continuous stay is based on availability, capacity, and progress towards goals.    To contact the front desk of [facility] please call   [phone] ext [phone] or ext. [phone].   [facility] [address], [place], [place] [address] Phone: [phone]   [facility]Women [facility]) [address] [place], [place] [address] Phone: [phone]   [facility] [address], [place] [address] Phone: [phone]   [facility]: [id number]. [address] [place], [place] [address] Phone: [phone]   [facility] [address], [place], [place] [address] Check in at 6:00PM for placement at a local shelter) Phone: [phone]  [facility]  At the [facility], we invite people experiencing -- or at risk for -- homelessness to be part of a supportive community free of barriers. Working alongside our guests, we provide services that help our [employer] neighbors reconnect with their own lives and with the community at large. The [employer] promotes dignity,  respect, connection and empowerment in an environment where trust and relationships matter. We're engaging those we serve in building their own progress toward a life without  homelessness.  Phone: [phone]  Address: [address] [place] [address]     [facility])  Address: [address] [place], [place], [place] [address] Hours:   Open 24 hours Phone: [phone]

## 2021-01-11 ENCOUNTER — Other Ambulatory Visit: Payer: Self-pay | Admitting: Family

## 2021-01-11 DIAGNOSIS — B20 Human immunodeficiency virus [HIV] disease: Secondary | ICD-10-CM

## 2021-01-14 ENCOUNTER — Other Ambulatory Visit: Payer: Self-pay | Admitting: Family

## 2021-01-14 DIAGNOSIS — B20 Human immunodeficiency virus [HIV] disease: Secondary | ICD-10-CM

## 2021-01-15 MED ORDER — SULFAMETHOXAZOLE-TRIMETHOPRIM 800-160 MG PO TABS: 1.0000 | ORAL_TABLET | Freq: Every day | ORAL | 0 refills | Status: DC

## 2021-02-03 ENCOUNTER — Telehealth: Payer: Self-pay | Admitting: *Deleted

## 2021-02-03 ENCOUNTER — Encounter: Payer: Self-pay | Admitting: *Deleted

## 2021-02-03 NOTE — Telephone Encounter (Signed)
Client reports he is out of HIV medication. No refills at pharmacy when called. Called [facility] and left message for return call.

## 2021-02-03 NOTE — Telephone Encounter (Signed)
Called to check on refills for client. Refills will be ready this afternoon per pharmacy.

## 2021-02-03 NOTE — Telephone Encounter (Signed)
Called PCP per client request for annual physical. Per call, he is not due until December. Client requested to make appt in December. Appointment made for December 16th at 1:00pm.

## 2021-02-03 NOTE — Congregational Nurse Program (Signed)
  Dept: [phone]   Congregational Nurse Program Note  Date of Encounter: 02/03/2021  Past Medical History: Past Medical History:  Diagnosis Date   Diabetes mellitus without complication (HCC)    Hepatitis C    HIV infection (HCC)    Schizophrenia (HCC)    Substance abuse (HCC)     Encounter Details:  CNP Questionnaire - 02/03/21 1533       Questionnaire   Do you give verbal consent to treat you today? Yes    Visit Setting Church or Organization    Location Patient Served At [facility]    Patient Status Homeless    Medical Provider Yes    Insurance Medicaid    Intervention Support;Refer    Housing/Utilities No permanent housing    Transportation Provided transportation assistance ([facility],bus pass, taxi voucher, etc.)    Referrals Other   [facility]           Client came into [facility] requesting help with medications stating he was running out and had been in jail the past two months. He said his blood pressure medications were stopped while in jail due to low blood pressure. Offered to check BP and client declined. Client requested to have a physical. Called client's PCP Fara Boros and he was not due a physical until December. Client said it was fine and asked to set his appt up for then. Appointment made for December 16th at 1:00. Called [facility] about refills for his medications. They will be ready after 1:00. Gave two bus passes for him to pick up.Called [facility] about medication that was not available at drug store. Left number for call back. Derian Pfost W RN CN [phone]

## 2021-02-04 ENCOUNTER — Telehealth: Payer: Self-pay | Admitting: *Deleted

## 2021-02-04 NOTE — Telephone Encounter (Signed)
Appointment made at [facility] Monday July 11th at 10:00.

## 2021-02-08 ENCOUNTER — Telehealth: Payer: Self-pay | Admitting: *Deleted

## 2021-02-08 ENCOUNTER — Ambulatory Visit (INDEPENDENT_AMBULATORY_CARE_PROVIDER_SITE_OTHER): Payer: Medicaid Other | Admitting: Family

## 2021-02-08 ENCOUNTER — Other Ambulatory Visit: Payer: Self-pay

## 2021-02-08 ENCOUNTER — Encounter: Payer: Self-pay | Admitting: Family

## 2021-02-08 ENCOUNTER — Encounter: Payer: Self-pay | Admitting: *Deleted

## 2021-02-08 VITALS — BP 117/79 | HR 106 | Wt 130.0 lb

## 2021-02-08 DIAGNOSIS — Z0000 Encounter for general adult medical examination without abnormal findings: Secondary | ICD-10-CM | POA: Diagnosis present

## 2021-02-08 DIAGNOSIS — Z794 Long term (current) use of insulin: Secondary | ICD-10-CM

## 2021-02-08 DIAGNOSIS — B20 Human immunodeficiency virus [HIV] disease: Secondary | ICD-10-CM | POA: Diagnosis not present

## 2021-02-08 DIAGNOSIS — F1010 Alcohol abuse, uncomplicated: Secondary | ICD-10-CM | POA: Diagnosis not present

## 2021-02-08 DIAGNOSIS — Z79899 Other long term (current) drug therapy: Secondary | ICD-10-CM | POA: Diagnosis not present

## 2021-02-08 DIAGNOSIS — E11610 Type 2 diabetes mellitus with diabetic neuropathic arthropathy: Secondary | ICD-10-CM | POA: Diagnosis not present

## 2021-02-08 DIAGNOSIS — IMO0002 Reserved for concepts with insufficient information to code with codable children: Secondary | ICD-10-CM

## 2021-02-08 DIAGNOSIS — Z113 Encounter for screening for infections with a predominantly sexual mode of transmission: Secondary | ICD-10-CM | POA: Diagnosis not present

## 2021-02-08 DIAGNOSIS — E1165 Type 2 diabetes mellitus with hyperglycemia: Secondary | ICD-10-CM

## 2021-02-08 MED ORDER — DOLUTEGRAVIR SODIUM 50 MG PO TABS: 50.0000 mg | ORAL_TABLET | Freq: Every day | ORAL | 2 refills | Status: DC

## 2021-02-08 MED ORDER — SULFAMETHOXAZOLE-TRIMETHOPRIM 800-160 MG PO TABS: 1.0000 | ORAL_TABLET | Freq: Every day | ORAL | 2 refills | Status: DC

## 2021-02-08 MED ORDER — EMTRICITABINE-TENOFOVIR AF 200-25 MG PO TABS: 1.0000 | ORAL_TABLET | Freq: Every day | ORAL | 2 refills | Status: DC

## 2021-02-08 NOTE — Assessment & Plan Note (Addendum)
Derrick Cooper continues to have poorly controlled AIDS having been off medication for about 2 weeks after being released from prison. He has no signs of opportunistic infection. Several confounding factors including homelessness and polysubstance use. Now sober for 1 month. Discussed importance of taking medication as prescribed. Check lab work today. It is unlikely that he has developed resistance to [facility] however will check genotype. Chance medication to Tivicay and Descovy as there are medication interactions with Symtuza. Continue current dose of Bactrim for OI prophylaxis. Will have him speak with [facility] for resources. Plan for follow up in 1 month or sooner if needed.

## 2021-02-08 NOTE — Congregational Nurse Program (Signed)
  Dept: [phone]   Congregational Nurse Program Note  Date of Encounter: 02/08/2021  Past Medical History: Past Medical History:  Diagnosis Date   Diabetes mellitus without complication (HCC)    Hepatitis C    HIV infection (HCC)    Schizophrenia (HCC)    Substance abuse (HCC)     Encounter Details:  CNP Questionnaire - 02/08/21 1427       Questionnaire   Do you give verbal consent to treat you today? Yes    Visit Setting Church or Organization    Location Patient Served At [facility]    Patient Status Homeless    Medical Provider Yes    Insurance Medicaid    Intervention Support;Refer    Housing/Utilities No permanent housing    Transportation Provided transportation assistance ([facility],bus pass, taxi voucher, etc.)    Referrals PCP - [place]            Appt set up this morning for client to have transportation to his infectious disease doctor. Appt was at 10:00 today at [facility].

## 2021-02-08 NOTE — Progress Notes (Signed)
Brief Narrative   Patient ID: Derrick Cooper, male    DOB: 1961/05/26, 60 y.o.   MRN: [id number]    Subjective:    Chief Complaint  Patient presents with   Follow-up     HPI:  Derrick Cooper is a 60 y.o. male with HIV disease last seen on 08/27/2020 having been off medication for approximately 3 to 4 months secondary to relapse and alcohol use.  Viral load was 71,800 with CD4 count of 189.  He was restarted on Bactrim along with continued Biktarvy with requested follow-up in 1 month.  He has subsequently been seen in the emergency department on 3 separate occasions with suicidal ideation/aggressive behavior.  Here today for follow-up.  Derrick Cooper has not been taking his medication since he was released from prison secondary to not having refills Believes he may be resistant to [facility] because he was on it for 2 months in prison and did not gain any significant amount of weight. Currently having some weakness and dizziness. Denies fevers, chills, night sweats, headaches, changes in vision, neck pain/stiffness, nausea, diarrhea, vomiting, lesions or rashes.  Derrick Cooper remains covered by Medicaid. Has been sober from polysubstance use for 1 month. Remains homeless. Continues to smoke tobacco. Concerned that he is urinating a lot and has been drinking almost 20 cokes per day in addition to some water. He is taking his metformin as prescribed. Due for Covid booster/vaccinations.   No Known Allergies    Outpatient Medications Prior to Visit  Medication Sig Dispense Refill   atorvastatin (LIPITOR) 40 MG tablet Take 40 mg by mouth daily.      metFORMIN (GLUCOPHAGE) 500 MG tablet Take 1 tablet (500 mg total) by mouth 2 (two) times daily with a meal. 60 tablet 3   QUEtiapine (SEROQUEL) 200 MG tablet Take 1 tablet (200 mg total) by mouth at bedtime. 30 tablet 2   hydrOXYzine (VISTARIL) 50 MG capsule Take 50 mg by mouth at bedtime. (Patient not taking: Reported on 02/08/2021)     lisinopril  (ZESTRIL) 5 MG tablet Take 5 mg by mouth daily. (Patient not taking: Reported on 02/08/2021)     BIKTARVY 50-200-25 MG TABS tablet TAKE 1 TABLET BY MOUTH DAILY (Patient not taking: Reported on 02/08/2021) 30 tablet 0   HYDROcodone-acetaminophen (NORCO/VICODIN) 5-325 MG tablet Take 1-2 tablets by mouth every 4 (four) hours as needed for moderate pain. (Patient not taking: No sig reported) 30 tablet 0   sulfamethoxazole-trimethoprim (BACTRIM DS) 800-160 MG tablet Take 1 tablet by mouth daily. (Patient not taking: Reported on 02/08/2021) 30 tablet 0   No facility-administered medications prior to visit.     Past Medical History:  Diagnosis Date   Diabetes mellitus without complication (HCC)    Hepatitis C    HIV infection (HCC)    Schizophrenia (HCC)    Substance abuse (HCC)      Past Surgical History:  Procedure Laterality Date   ORIF MANDIBULAR FRACTURE Left 12/31/2019   Procedure: OPEN REDUCTION INTERNAL FIXATION (ORIF) MANDIBULAR FRACTURE , NASAL  MAXILLO MANDIBULAR FIXATION;  Surgeon: [name], MD;  Location[facility] OR;  Service: ENT;  Laterality: Left;       Review of Systems  Constitutional:  Positive for appetite change. Negative for chills, fatigue, fever and unexpected weight change.  Eyes:  Negative for visual disturbance.  Respiratory:  Negative for cough, chest tightness, shortness of breath and wheezing.   Cardiovascular:  Negative for chest pain and leg swelling.  Gastrointestinal:  Positive for diarrhea. Negative for abdominal pain, constipation, nausea and vomiting.  Genitourinary:  Negative for dysuria, flank pain, frequency, genital sores, hematuria and urgency.  Skin:  Negative for rash.  Allergic/Immunologic: Negative for immunocompromised state.  Neurological:  Positive for light-headedness. Negative for dizziness and headaches.     Objective:    BP 117/79   Pulse (!) 106   Wt 130 lb (59 kg)   SpO2 100%   BMI 16.69 kg/m  Nursing note and vital signs  reviewed.  Physical Exam Constitutional:      General: He is not in acute distress.    Appearance: He is well-developed and underweight.  Eyes:     Conjunctiva/sclera: Conjunctivae normal.  Cardiovascular:     Rate and Rhythm: Normal rate and regular rhythm.     Heart sounds: Normal heart sounds. No murmur heard.   No friction rub. No gallop.  Pulmonary:     Effort: Pulmonary effort is normal. No respiratory distress.     Breath sounds: Normal breath sounds. No wheezing or rales.  Chest:     Chest wall: No tenderness.  Abdominal:     General: Bowel sounds are normal.     Palpations: Abdomen is soft.     Tenderness: There is no abdominal tenderness.  Musculoskeletal:     Cervical back: Neck supple.  Lymphadenopathy:     Cervical: No cervical adenopathy.  Skin:    General: Skin is warm and dry.     Findings: No rash.  Neurological:     Mental Status: He is alert and oriented to person, place, and time.  Psychiatric:        Behavior: Behavior normal.        Thought Content: Thought content normal.        Judgment: Judgment normal.     Depression screen [facility] 2/9 09/07/2020 08/27/2020 06/24/2019 04/23/2019 04/23/2019  Decreased Interest 2 0 0 0 0  Down, Depressed, Hopeless 3 0 0 0 0  PHQ - 2 Score 5 0 0 0 0  Altered sleeping 3 - - 0 0  Tired, decreased energy 3 - - 0 0  Change in appetite 1 - - 0 0  Feeling bad or failure about yourself  2 - - 0 0  Trouble concentrating 2 - - 0 0  Moving slowly or fidgety/restless 1 - - 0 0  Suicidal thoughts 3 - - 0 0  PHQ-9 Score 20 - - 0 0  Some recent data might be hidden       Assessment & Plan:    Patient Active Problem List   Diagnosis Date Noted   Alcohol abuse    Fracture of mandible (HCC)    Acute respiratory failure with hypoxia (HCC) 12/27/2019   Lobar pneumonia (HCC) 12/27/2019   Community acquired pneumonia of right lung 12/27/2019   Polysubstance abuse (HCC) 12/10/2019   Homelessness 12/10/2019   Substance induced  mood disorder (HCC) 12/10/2019   MDD (major depressive disorder), recurrent episode, severe (HCC) 08/09/2018   Schizoaffective disorder, bipolar type (HCC)    Health care maintenance 06/26/2018   Tobacco use 06/26/2018   Furuncle of right axilla 11/13/2017   Hyperlipidemia associated with type 2 diabetes mellitus (HCC) 10/25/2017   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 Shearon BaloRuffins increased urination is likely related to poorly controlled diabetes and soda intake. Discussed changing to water or zero sugar products if able. Check A1c. Continue current dose of metformin. May need additional follow up with primary care.        Relevant Orders   HgB A1c     Other   AIDS (acquired immune deficiency syndrome) (HCC) - Primary    [name] continues to have poorly controlled AIDS having been off medication for about 2 weeks after being released from prison. He has no signs of opportunistic infection. Several confounding factors including homelessness and polysubstance use. Now sober for 1 month. Discussed importance of taking medication as prescribed. Check lab work today. It is unlikely that he has developed resistance to [facility] however will check genotype. Chance medication to Tivicay and Descovy as there are medication interactions with Symtuza. Continue current dose of  Bactrim for OI prophylaxis. Will have him speak with [facility] for resources. Plan for follow up in 1 month or sooner if needed.        Relevant Medications   dolutegravir (TIVICAY) 50 MG tablet   emtricitabine-tenofovir AF (DESCOVY) 200-25 MG tablet   sulfamethoxazole-trimethoprim (BACTRIM DS) 800-160 MG tablet   Other Relevant Orders   COMPLETE METABOLIC PANEL WITH GFR   T-helper cell (CD4)- ([facility] clinic only)   Lipid panel   HIV RNA, RTPCR W/R GT (RTI, PI,INT)   Health care maintenance    Discussed importance of safe sexual practice to reduce risk of STI. Condoms offered.  Due for Covid vaccination - [facility] [employer] [facility]        Alcohol abuse    [name] reports he has been sober for one month now. Encouraged to continue using resources and counseling. Will continue to monitor.        Other Visit Diagnoses     Screening for STDs (sexually transmitted diseases)       Relevant Orders   RPR   Pharmacologic therapy       Relevant Orders   Lipid panel        I have discontinued Ercell L. Flaim's HYDROcodone-acetaminophen and Biktarvy. I am also having him start on dolutegravir and emtricitabine-tenofovir AF. Additionally, I am having him maintain his atorvastatin, QUEtiapine, metFORMIN, lisinopril, hydrOXYzine, and sulfamethoxazole-trimethoprim.   Meds ordered this encounter  Medications   dolutegravir (TIVICAY) 50 MG tablet    Sig: Take 1 tablet (50 mg total) by mouth daily.    Dispense:  30 tablet    Refill:  2    Order Specific Question:   Supervising Provider    Answer:   Drue SecondSNIDER, CYNTHIA [[id number]]   emtricitabine-tenofovir AF (DESCOVY) 200-25 MG tablet    Sig: Take 1 tablet by mouth daily.    Dispense:  30 tablet    Refill:  2    Order Specific Question:   Supervising Provider    Answer:   Drue SecondSNIDER, CYNTHIA [[id number]]   sulfamethoxazole-trimethoprim (BACTRIM DS) 800-160 MG tablet    Sig: Take 1 tablet by mouth daily.    Dispense:  30 tablet  Refill:  2    Order Specific Question:   Supervising Provider    Answer:   Judyann Munson [[id number]]     Follow-up: Return in about 1 month (around 03/11/2021), or if symptoms worsen or fail to improve.   Marcos Eke, MSN, FNP-C Nurse Practitioner [facility] for Infectious Disease [facility] Main number: [phone]

## 2021-02-08 NOTE — Telephone Encounter (Signed)
Set up  transportation to [facility] [address] [employer] [address] for a 10:00 appt today. Client to be picked up by 9:45 and return to [address] [place] after appt.

## 2021-02-08 NOTE — Assessment & Plan Note (Signed)
   Discussed importance of safe sexual practice to reduce risk of STI. Condoms offered.   Due for Covid vaccination - recommended [employer] or Moderna at [facility].

## 2021-02-08 NOTE — Assessment & Plan Note (Signed)
Derrick Cooper reports he has been sober for one month now. Encouraged to continue using resources and counseling. Will continue to monitor.

## 2021-02-08 NOTE — Assessment & Plan Note (Signed)
Derrick Cooper increased urination is likely related to poorly controlled diabetes and soda intake. Discussed changing to water or zero sugar products if able. Check A1c. Continue current dose of metformin. May need additional follow up with primary care.

## 2021-02-08 NOTE — Patient Instructions (Signed)
Nice to see you.  We will check your lab work today.  Restart taking your Bactrim daily.   Start taking the Tivicay and Descovy once you get it from the pharmacy.  Cut down on the soda and focus on drinking water.   Plan for follow up in 1 month or sooner if needed.   Have a great day and stay safe!

## 2021-02-09 ENCOUNTER — Telehealth: Payer: Self-pay | Admitting: Infectious Disease

## 2021-02-09 ENCOUNTER — Encounter: Payer: Self-pay | Admitting: Infectious Disease

## 2021-02-09 LAB — T-HELPER CELL (CD4) - (RCID CLINIC ONLY)
CD4 % Helper T Cell: 23 % — ABNORMAL LOW (ref 33–65)
CD4 T Cell Abs: 175 /uL — ABNORMAL LOW (ref 400–1790)

## 2021-02-09 NOTE — Telephone Encounter (Signed)
There is a haiku note as well as a secure chat message I placed on this patient I was called at 245 regarding his blood sugar which was well over thousand and bicarbonate which was 18 his phone number which is listed is actually out of the [facility].  I did call his sister's phone number as well but was unable to reach her.

## 2021-02-09 NOTE — Telephone Encounter (Signed)
Attempted to call patient regarding lab results. Not able to reach patient with number listed in his chart. Not able to reach patient's sister either. Will forward message to Triage for additional assistance in reaching patient.  Juanita Laster, RMA

## 2021-02-09 NOTE — Telephone Encounter (Signed)
Patient was able to return missed call. Advised patient he needs to go to ED today due to his uncontrolled blood sugar. Patient verbalized understanding. Will follow up with PCP to help get better control of his diabetes.  Patient states that best number to reach him is his sisters.  Juanita Laster, RMA

## 2021-02-09 NOTE — Progress Notes (Unsigned)
Received call at 245 re blood sugar of 1k. Bicarbonate of 18. Phone number listed is [facility]  Cannot leave message or contact pt

## 2021-02-10 ENCOUNTER — Inpatient Hospital Stay (HOSPITAL_COMMUNITY)
Admission: EM | Admit: 2021-02-10 | Discharge: 2021-02-16 | DRG: 637 | Disposition: A | Discharge status: Home / Self Care | Payer: Medicaid Other | Attending: Internal Medicine | Admitting: Internal Medicine

## 2021-02-10 ENCOUNTER — Encounter: Payer: Self-pay | Admitting: *Deleted

## 2021-02-10 DIAGNOSIS — Z560 Unemployment, unspecified: Secondary | ICD-10-CM

## 2021-02-10 DIAGNOSIS — Z20822 Contact with and (suspected) exposure to covid-19: Secondary | ICD-10-CM | POA: Diagnosis present

## 2021-02-10 DIAGNOSIS — F17210 Nicotine dependence, cigarettes, uncomplicated: Secondary | ICD-10-CM | POA: Diagnosis present

## 2021-02-10 DIAGNOSIS — E1010 Type 1 diabetes mellitus with ketoacidosis without coma: Secondary | ICD-10-CM | POA: Diagnosis not present

## 2021-02-10 DIAGNOSIS — B20 Human immunodeficiency virus [HIV] disease: Secondary | ICD-10-CM

## 2021-02-10 DIAGNOSIS — R739 Hyperglycemia, unspecified: Secondary | ICD-10-CM | POA: Diagnosis not present

## 2021-02-10 DIAGNOSIS — D893 Immune reconstitution syndrome: Secondary | ICD-10-CM

## 2021-02-10 DIAGNOSIS — Z21 Asymptomatic human immunodeficiency virus [HIV] infection status: Secondary | ICD-10-CM

## 2021-02-10 DIAGNOSIS — B370 Candidal stomatitis: Secondary | ICD-10-CM

## 2021-02-10 DIAGNOSIS — Z79899 Other long term (current) drug therapy: Secondary | ICD-10-CM

## 2021-02-10 DIAGNOSIS — E871 Hypo-osmolality and hyponatremia: Secondary | ICD-10-CM | POA: Diagnosis present

## 2021-02-10 DIAGNOSIS — K739 Chronic hepatitis, unspecified: Secondary | ICD-10-CM | POA: Diagnosis present

## 2021-02-10 DIAGNOSIS — E785 Hyperlipidemia, unspecified: Secondary | ICD-10-CM | POA: Diagnosis present

## 2021-02-10 DIAGNOSIS — E43 Unspecified severe protein-calorie malnutrition: Secondary | ICD-10-CM | POA: Insufficient documentation

## 2021-02-10 DIAGNOSIS — Z681 Body mass index (BMI) 19 or less, adult: Secondary | ICD-10-CM

## 2021-02-10 DIAGNOSIS — Z7984 Long term (current) use of oral hypoglycemic drugs: Secondary | ICD-10-CM

## 2021-02-10 DIAGNOSIS — E1110 Type 2 diabetes mellitus with ketoacidosis without coma: Principal | ICD-10-CM | POA: Diagnosis present

## 2021-02-10 DIAGNOSIS — Z5900 Homelessness unspecified: Secondary | ICD-10-CM

## 2021-02-10 DIAGNOSIS — F209 Schizophrenia, unspecified: Secondary | ICD-10-CM | POA: Diagnosis present

## 2021-02-10 LAB — I-STAT VENOUS BLOOD GAS, ED
Acid-base deficit: 5 mmol/L — ABNORMAL HIGH (ref 0.0–2.0)
Bicarbonate: 21.5 mmol/L (ref 20.0–28.0)
Calcium, Ion: 1.16 mmol/L (ref 1.15–1.40)
HCT: 38 % — ABNORMAL LOW (ref 39.0–52.0)
Hemoglobin: 12.9 g/dL — ABNORMAL LOW (ref 13.0–17.0)
O2 Saturation: 99 %
Potassium: 4.4 mmol/L (ref 3.5–5.1)
Sodium: 134 mmol/L — ABNORMAL LOW (ref 135–145)
TCO2: 23 mmol/L (ref 22–32)
pCO2, Ven: 44.5 mmHg (ref 44.0–60.0)
pH, Ven: 7.293 (ref 7.250–7.430)
pO2, Ven: 139 mmHg — ABNORMAL HIGH (ref 32.0–45.0)

## 2021-02-10 LAB — URINALYSIS, ROUTINE W REFLEX MICROSCOPIC
Bilirubin Urine: NEGATIVE
Glucose, UA: >=500 mg/dL — AB
Hgb urine dipstick: NEGATIVE
Ketones, ur: 80 mg/dL — AB
Leukocytes,Ua: NEGATIVE
Nitrite: NEGATIVE
Protein, ur: NEGATIVE mg/dL
Specific Gravity, Urine: 1.023 (ref 1.005–1.030)
pH: 6 (ref 5.0–8.0)

## 2021-02-10 LAB — CBC WITH DIFFERENTIAL/PLATELET
Abs Immature Granulocytes: 0.03 10*3/uL (ref 0.00–0.07)
Basophils Absolute: 0 10*3/uL (ref 0.0–0.1)
Basophils Relative: 0 %
Eosinophils Absolute: 0 10*3/uL (ref 0.0–0.5)
Eosinophils Relative: 0 %
HCT: 36.5 % — ABNORMAL LOW (ref 39.0–52.0)
Hemoglobin: 12.1 g/dL — ABNORMAL LOW (ref 13.0–17.0)
Immature Granulocytes: 1 %
Lymphocytes Relative: 33 %
Lymphs Abs: 1.9 10*3/uL (ref 0.7–4.0)
MCH: 30 pg (ref 26.0–34.0)
MCHC: 33.2 g/dL (ref 30.0–36.0)
MCV: 90.3 fL (ref 80.0–100.0)
Monocytes Absolute: 0.6 10*3/uL (ref 0.1–1.0)
Monocytes Relative: 10 %
Neutro Abs: 3.2 10*3/uL (ref 1.7–7.7)
Neutrophils Relative %: 56 %
Platelets: 338 10*3/uL (ref 150–400)
RBC: 4.04 MIL/uL — ABNORMAL LOW (ref 4.22–5.81)
RDW: 13.2 % (ref 11.5–15.5)
WBC: 5.7 10*3/uL (ref 4.0–10.5)
nRBC: 0 % (ref 0.0–0.2)

## 2021-02-10 LAB — BASIC METABOLIC PANEL
Anion gap: 9 (ref 5–15)
Anion gap: 9 (ref 5–15)
BUN: 9 mg/dL (ref 6–20)
BUN: 9 mg/dL (ref 6–20)
CO2: 21 mmol/L — ABNORMAL LOW (ref 22–32)
CO2: 21 mmol/L — ABNORMAL LOW (ref 22–32)
Calcium: 8.8 mg/dL — ABNORMAL LOW (ref 8.9–10.3)
Calcium: 8.5 mg/dL — ABNORMAL LOW (ref 8.9–10.3)
Chloride: 104 mmol/L (ref 98–111)
Chloride: 102 mmol/L (ref 98–111)
Creatinine, Ser: 0.71 mg/dL (ref 0.61–1.24)
Creatinine, Ser: 0.74 mg/dL (ref 0.61–1.24)
GFR, Estimated: >60 mL/min (ref >60)
GFR, Estimated: >60 mL/min (ref >60)
Glucose, Bld: 121 mg/dL — ABNORMAL HIGH (ref 70–99)
Glucose, Bld: 199 mg/dL — ABNORMAL HIGH (ref 70–99)
Potassium: 3.3 mmol/L — ABNORMAL LOW (ref 3.5–5.1)
Potassium: 3.1 mmol/L — ABNORMAL LOW (ref 3.5–5.1)
Sodium: 134 mmol/L — ABNORMAL LOW (ref 135–145)
Sodium: 132 mmol/L — ABNORMAL LOW (ref 135–145)

## 2021-02-10 LAB — COMPREHENSIVE METABOLIC PANEL
ALT: 12 U/L (ref 0–44)
AST: 17 U/L (ref 15–41)
Albumin: 2.5 g/dL — ABNORMAL LOW (ref 3.5–5.0)
Alkaline Phosphatase: 60 U/L (ref 38–126)
Anion gap: 16 — ABNORMAL HIGH (ref 5–15)
BUN: 17 mg/dL (ref 6–20)
CO2: 18 mmol/L — ABNORMAL LOW (ref 22–32)
Calcium: 8.8 mg/dL — ABNORMAL LOW (ref 8.9–10.3)
Chloride: 96 mmol/L — ABNORMAL LOW (ref 98–111)
Creatinine, Ser: 1.1 mg/dL (ref 0.61–1.24)
GFR, Estimated: >60 mL/min (ref >60)
Glucose, Bld: 427 mg/dL — ABNORMAL HIGH (ref 70–99)
Potassium: 4.6 mmol/L (ref 3.5–5.1)
Sodium: 130 mmol/L — ABNORMAL LOW (ref 135–145)
Total Bilirubin: 1.3 mg/dL — ABNORMAL HIGH (ref 0.3–1.2)
Total Protein: 8.2 g/dL — ABNORMAL HIGH (ref 6.5–8.1)

## 2021-02-10 LAB — CBG MONITORING, ED
Glucose-Capillary: 363 mg/dL — ABNORMAL HIGH (ref 70–99)
Glucose-Capillary: 325 mg/dL — ABNORMAL HIGH (ref 70–99)
Glucose-Capillary: 186 mg/dL — ABNORMAL HIGH (ref 70–99)
Glucose-Capillary: 137 mg/dL — ABNORMAL HIGH (ref 70–99)
Glucose-Capillary: 138 mg/dL — ABNORMAL HIGH (ref 70–99)
Glucose-Capillary: 129 mg/dL — ABNORMAL HIGH (ref 70–99)
Glucose-Capillary: 166 mg/dL — ABNORMAL HIGH (ref 70–99)

## 2021-02-10 LAB — BETA-HYDROXYBUTYRIC ACID
Beta-Hydroxybutyric Acid: 5.23 mmol/L — ABNORMAL HIGH (ref 0.05–0.27)
Beta-Hydroxybutyric Acid: 3.16 mmol/L — ABNORMAL HIGH (ref 0.05–0.27)

## 2021-02-10 LAB — GLUCOSE, CAPILLARY
Glucose-Capillary: 212 mg/dL — ABNORMAL HIGH (ref 70–99)
Glucose-Capillary: 229 mg/dL — ABNORMAL HIGH (ref 70–99)

## 2021-02-10 LAB — RESP PANEL BY RT-PCR (FLU A&B, COVID) ARPGX2
Influenza A by PCR: NEGATIVE
Influenza B by PCR: NEGATIVE
SARS Coronavirus 2 by RT PCR: NEGATIVE

## 2021-02-10 LAB — MAGNESIUM: Magnesium: 1.7 mg/dL (ref 1.7–2.4)

## 2021-02-10 MED ORDER — INSULIN REGULAR(HUMAN) IN NACL 100-0.9 UT/100ML-% IV SOLN
INTRAVENOUS | Status: DC
  Administered 2021-02-10: 11.5 [IU]/h via INTRAVENOUS
  Filled 2021-02-10: qty 100

## 2021-02-10 MED ORDER — DEXTROSE IN LACTATED RINGERS 5 % IV SOLN: INTRAVENOUS | Status: DC

## 2021-02-10 MED ORDER — NICOTINE 14 MG/24HR TD PT24
14.0000 mg | MEDICATED_PATCH | Freq: Every day | TRANSDERMAL | Status: DC
  Administered 2021-02-10 – 2021-02-15 (×6): 14 mg via TRANSDERMAL
  Filled 2021-02-10 (×6): qty 1

## 2021-02-10 MED ORDER — ENOXAPARIN SODIUM 40 MG/0.4ML IJ SOSY
40.0000 mg | PREFILLED_SYRINGE | INTRAMUSCULAR | Status: DC
  Administered 2021-02-10 – 2021-02-15 (×6): 40 mg via SUBCUTANEOUS
  Filled 2021-02-10 (×6): qty 0.4

## 2021-02-10 MED ORDER — SODIUM CHLORIDE 0.9 % IV BOLUS
500.0000 mL | Freq: Once | INTRAVENOUS | Status: AC
  Administered 2021-02-10: 500 mL via INTRAVENOUS

## 2021-02-10 MED ORDER — POTASSIUM CHLORIDE 10 MEQ/100ML IV SOLN
10.0000 meq | INTRAVENOUS | Status: AC
  Administered 2021-02-11 (×5): 10 meq via INTRAVENOUS
  Filled 2021-02-10 (×5): qty 100

## 2021-02-10 MED ORDER — EMTRICITABINE-TENOFOVIR AF 200-25 MG PO TABS
1.0000 | ORAL_TABLET | Freq: Every day | ORAL | Status: DC
  Administered 2021-02-10 – 2021-02-15 (×6): 1 via ORAL
  Filled 2021-02-10 (×7): qty 1

## 2021-02-10 MED ORDER — MAGNESIUM SULFATE 2 GM/50ML IV SOLN
2.0000 g | Freq: Once | INTRAVENOUS | Status: AC
  Administered 2021-02-10: 2 g via INTRAVENOUS
  Filled 2021-02-10: qty 50

## 2021-02-10 MED ORDER — LACTATED RINGERS IV SOLN: INTRAVENOUS | Status: DC

## 2021-02-10 MED ORDER — LACTATED RINGERS IV BOLUS
20.0000 mL/kg | Freq: Once | INTRAVENOUS | Status: AC
  Administered 2021-02-10: 1160 mL via INTRAVENOUS

## 2021-02-10 MED ORDER — POTASSIUM CHLORIDE 10 MEQ/100ML IV SOLN
10.0000 meq | INTRAVENOUS | Status: AC
  Administered 2021-02-10 (×2): 10 meq via INTRAVENOUS
  Filled 2021-02-10 (×2): qty 100

## 2021-02-10 MED ORDER — SODIUM CHLORIDE 0.9 % IV BOLUS
1000.0000 mL | Freq: Once | INTRAVENOUS | Status: AC
  Administered 2021-02-10: 1000 mL via INTRAVENOUS

## 2021-02-10 MED ORDER — SULFAMETHOXAZOLE-TRIMETHOPRIM 800-160 MG PO TABS
1.0000 | ORAL_TABLET | Freq: Every day | ORAL | Status: DC
  Administered 2021-02-10 – 2021-02-15 (×6): 1 via ORAL
  Filled 2021-02-10 (×7): qty 1

## 2021-02-10 MED ORDER — DEXTROSE 50 % IV SOLN: 0.0000 mL | INTRAVENOUS | Status: DC | PRN

## 2021-02-10 MED ORDER — SODIUM CHLORIDE 0.9 % IV SOLN: INTRAVENOUS | Status: DC

## 2021-02-10 MED ORDER — DOLUTEGRAVIR SODIUM 50 MG PO TABS
50.0000 mg | ORAL_TABLET | Freq: Every day | ORAL | Status: DC
  Administered 2021-02-10 – 2021-02-15 (×6): 50 mg via ORAL
  Filled 2021-02-10 (×7): qty 1

## 2021-02-10 MED ORDER — ATORVASTATIN CALCIUM 40 MG PO TABS
40.0000 mg | ORAL_TABLET | Freq: Every day | ORAL | Status: DC
  Administered 2021-02-11 – 2021-02-15 (×5): 40 mg via ORAL
  Filled 2021-02-10 (×5): qty 1

## 2021-02-10 MED ORDER — NYSTATIN 100000 UNIT/ML MT SUSP
5.0000 mL | Freq: Four times a day (QID) | OROMUCOSAL | Status: DC
  Administered 2021-02-10 – 2021-02-12 (×10): 500000 [IU] via ORAL
  Filled 2021-02-10 (×11): qty 5

## 2021-02-10 NOTE — ED Notes (Signed)
No callback from internal medicine resident. Secretary paged attending.

## 2021-02-10 NOTE — Congregational Nurse Program (Signed)
  Dept: [phone]   Congregational Nurse Program Note  Date of Encounter: 02/10/2021  Past Medical History: Past Medical History:  Diagnosis Date   Diabetes mellitus without complication (HCC)    Hepatitis C    HIV infection (HCC)    Schizophrenia (HCC)    Substance abuse (HCC)     Encounter Details:  CNP Questionnaire - 02/10/21 1123       Questionnaire   Do you give verbal consent to treat you today? Yes    Visit Setting Church or Organization    Location Patient Served At [facility]    Patient Status Homeless    Medical Provider Yes    Insurance Medicaid    Intervention Refer;Support    Housing/Utilities No permanent housing    Transportation Provided transportation assistance ([facility] trans[facility],bus pass, taxi voucher, etc.)    Referrals Emergency Department    Life-Saving Intervention Made Yes            Client came into writer's office c/o dizziness and weakness. He reports it took almost two hours to walk from [employer] to [facility]. Checked vitals and cbg. CBG 488. Client reports he took 1000mg  metformin this morning. Called 911 for ED evaluation.  Loretta Doutt W RN CN [phone]

## 2021-02-10 NOTE — ED Provider Notes (Signed)
[facility] EMERGENCY DEPARTMENT Provider Note   CSN: [id number] Arrival date & time: 02/10/21  1151     History Chief Complaint  Patient presents with   Hyperglycemia    Derrick Cooper is a 60 y.o. male.   Hyperglycemia Associated symptoms: weakness   Associated symptoms: no abdominal pain, no chest pain, no dysuria, no fever, no nausea, no shortness of breath and no vomiting    This patient is a very pleasant 60 year old male, he has a history of diabetes, hepatitis C, HIV, schizophrenia and substance abuse.  The patient is currently homeless, [facility], he was noted to be hyperglycemic around 350, he states that he has been generally weak for quite some time, it is not a focal weakness but all over.  He has been eating and drinking but states he can only eat what he can find since he is homeless and drinks lots of sugar soda.  He states he was taking his metformin, he last took it this morning, he was taken off insulin quite some time ago.  He is not currently being treated for HIV stating that he had become resistant to the medications after a long time of noncompliance and substance abuse.  He denies pain in his chest his abdomen or his head, he denies nausea vomiting or diarrhea, he denies chest pain shortness of breath fevers or chills.  Paramedics bring the patient from the [facility], measured sugar around 350.  No other cardiac abnormalities, vital signs were reassuring.  Past Medical History:  Diagnosis Date   Diabetes mellitus without complication (HCC)    Hepatitis C    HIV infection (HCC)    Schizophrenia (HCC)    Substance abuse (HCC)     Patient Active Problem List   Diagnosis Date Noted   Alcohol abuse    Fracture of mandible (HCC)    Acute respiratory failure with hypoxia (HCC) 12/27/2019   Lobar pneumonia (HCC) 12/27/2019   Community acquired pneumonia of right lung 12/27/2019    Polysubstance abuse (HCC) 12/10/2019   Homelessness 12/10/2019   Substance induced mood disorder (HCC) 12/10/2019   MDD (major depressive disorder), recurrent episode, severe (HCC) 08/09/2018   Schizoaffective disorder, bipolar type (HCC)    Health care maintenance 06/26/2018   Tobacco use 06/26/2018   Furuncle of right axilla 11/13/2017   Hyperlipidemia associated with type 2 diabetes mellitus (HCC) 10/25/2017   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, with long-term current use of insulin (HCC) 10/25/2017   AIDS (acquired immune deficiency syndrome) (HCC) 09/27/2017   Chronic hepatitis C without hepatic coma (HCC) 09/27/2017   Acute non-recurrent maxillary sinusitis 09/27/2017   Schizophrenia (HCC) 09/07/2017   Controlled diabetes mellitus with complication, without long-term current use of insulin (HCC) 09/07/2017   Urinary incontinence 09/07/2017    Past Surgical History:  Procedure Laterality Date   ORIF MANDIBULAR FRACTURE Left 12/31/2019   Procedure: OPEN REDUCTION INTERNAL FIXATION (ORIF) MANDIBULAR FRACTURE , NASAL  MAXILLO MANDIBULAR FIXATION;  Surgeon: [name], MD;  Location: [facility] OR;  Service: ENT;  Laterality: Left;       Family History  Problem Relation Age of Onset   Breast cancer Mother    Lung cancer Father    Heart attack Father     Social History   Tobacco Use   Smoking status: Every Day    Packs/day: 0.50    Years: 42.00  Pack years: 21.00    Types: Cigarettes   Smokeless tobacco: Never  Vaping Use   Vaping Use: Never used  Substance Use Topics   Alcohol use: Yes    Comment: Clean for 2 months    Drug use: Yes    Types: Marijuana, Methamphetamines, "Crack" cocaine    Comment: Has used crack in the last month; marajuana  occasionally    Home Medications Prior to Admission medications   Medication Sig Start Date End Date Taking? Authorizing Provider  atorvastatin (LIPITOR) 40 MG tablet Take 40 mg by  mouth daily.  06/04/19   [provider]  dolutegravir (TIVICAY) 50 MG tablet Take 1 tablet (50 mg total) by mouth daily. 02/08/21   Veryl Speak, FNP  emtricitabine-tenofovir AF (DESCOVY) 200-25 MG tablet Take 1 tablet by mouth daily. 02/08/21   Veryl Speak, FNP  hydrOXYzine (VISTARIL) 50 MG capsule Take 50 mg by mouth at bedtime. Patient not taking: Reported on 02/08/2021 06/17/20   [provider]  lisinopril (ZESTRIL) 5 MG tablet Take 5 mg by mouth daily. Patient not taking: Reported on 02/08/2021 01/20/20   [provider]  metFORMIN (GLUCOPHAGE) 500 MG tablet Take 1 tablet (500 mg total) by mouth 2 (two) times daily with a meal. 10/28/19   Malvin Johns, MD  QUEtiapine (SEROQUEL) 200 MG tablet Take 1 tablet (200 mg total) by mouth at bedtime. 10/28/19   Malvin Johns, MD  sulfamethoxazole-trimethoprim (BACTRIM DS) 800-160 MG tablet Take 1 tablet by mouth daily. 02/08/21   Veryl Speak, FNP    Allergies    Patient has no known allergies.  Review of Systems   Review of Systems  Constitutional:  Negative for chills and fever.  HENT:  Negative for sore throat.   Eyes:  Negative for visual disturbance.  Respiratory:  Negative for cough and shortness of breath.   Cardiovascular:  Negative for chest pain.  Gastrointestinal:  Negative for abdominal pain, diarrhea, nausea and vomiting.  Genitourinary:  Negative for dysuria and frequency.  Musculoskeletal:  Negative for back pain and neck pain.  Skin:  Negative for rash.  Neurological:  Positive for weakness. Negative for numbness and headaches.  Hematological:  Negative for adenopathy.  Psychiatric/Behavioral:  Negative for behavioral problems.    Physical Exam Updated Vital Signs Ht 1.905 m (6\' 3" )   Wt 58 kg   BMI 15.98 kg/m   Physical Exam Vitals and nursing note reviewed.  Constitutional:      General: He is not in acute distress.    Appearance: He is well-developed.  HENT:     Head:  Normocephalic and atraumatic.     Mouth/Throat:     Pharynx: No oropharyngeal exudate.     Comments: is present on the tongue Eyes:     General: No scleral icterus.       Right eye: No discharge.     Comments: Left eye patch in place chronic  Neck:     Thyroid: No thyromegaly.     Vascular: No JVD.  Cardiovascular:     Rate and Rhythm: Normal rate and regular rhythm.     Heart sounds: Normal heart sounds. No murmur heard.   No friction rub. No gallop.  Pulmonary:     Effort: Pulmonary effort is normal. No respiratory distress.     Breath sounds: Normal breath sounds. No wheezing or rales.  Abdominal:     General: Bowel sounds are normal. There is no distension.  Palpations: Abdomen is soft. There is no mass.     Tenderness: There is no abdominal tenderness.  Musculoskeletal:        General: No tenderness. Normal range of motion.     Cervical back: Normal range of motion and neck supple.  Lymphadenopathy:     Cervical: No cervical adenopathy.  Skin:    General: Skin is warm and dry.     Findings: No erythema or rash.  Neurological:     Mental Status: He is alert.     Coordination: Coordination normal.     Comments: The patient's mental status is normal, he was able to ambulate from the paramedic stretcher to the gurney, he is able to move all 4 extremities has normal speech normal facial symmetry, no obvious weakness  Psychiatric:        Behavior: Behavior normal.    ED Results / Procedures / Treatments   Labs (all labs ordered are listed, but only abnormal results are displayed) Labs Reviewed - No data to display  EKG None  Radiology No results found.  Procedures .Critical Care  Date/Time: 02/10/2021 7:39 PM Performed by: Eber Hong, MD Authorized by: Eber Hong, MD   Critical care provider statement:    Critical care time (minutes):  35   Critical care time was exclusive of:  Separately billable procedures and treating other patients and  teaching time   Critical care was necessary to treat or prevent imminent or life-threatening deterioration of the following conditions:  Endocrine crisis   Critical care was time spent personally by me on the following activities:  Blood draw for specimens, development of treatment plan with patient or surrogate, discussions with consultants, evaluation of patient's response to treatment, examination of patient, obtaining history from patient or surrogate, ordering and performing treatments and interventions, ordering and review of laboratory studies, ordering and review of radiographic studies, pulse oximetry, re-evaluation of patient's condition and review of old charts Comments:         Medications Ordered in ED Medications - No data to display  ED Course  I have reviewed the triage vital signs and the nursing notes.  Pertinent labs & imaging results that were available during my care of the patient were reviewed by me and considered in my medical decision making (see chart for details).    MDM Rules/Calculators/A&P                          Ultimately this patient does not appear to be in distress, he is progressively weak and has hyperglycemia likely related to poor diet, will check labs, give some IV fluids, the patient is not febrile coughing or short of breath.  Patient is agreeable to the plan  Normal CBC,  Metabolic panel shows low CO2 and AG of 16, c/w mild DKA Insulin drip started - admitted to unassigned medicine D/w consulting internist who has been kind enough to admit.    Final Clinical Impression(s) / ED Diagnoses Final diagnoses:  Diabetic ketoacidosis without coma associated with type 2 diabetes mellitus (HCC)     Eber Hong, MD 02/10/21 1941

## 2021-02-10 NOTE — ED Triage Notes (Signed)
[facility] with reports of hyperglycemia and weakness. Pt reports he took his metformin this morning but not on his HIV medications.

## 2021-02-10 NOTE — ED Notes (Signed)
Paged internal medicine resident due to anion gap closure.

## 2021-02-10 NOTE — H&P (Signed)
Date: 02/10/2021               Patient Name:  Derrick Cooper MRN: [id number]  DOB: Apr 15, 1961 Age / Sex: 60 y.o., male   PCP: Bryon Lions, PA-C         Medical Service: Internal Medicine Teaching Service         Attending Physician: Dr. Reymundo Poll, MD    First Contact: Ellison Carwin, MD Pager: EZ [phone]  Second Contact: Thurmon Fair, MD Pager: Cecilie Kicks [phone]       After Hours (After 5p/  First Contact Pager: [phone]  weekends / holidays): Second Contact Pager: [phone]   SUBJECTIVE   Chief Complaint: weakness, dizziness  History of Present Illness:  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 Patient was found to have blood glucose of 488 on CBG. Patient was transported to [facility] ED for further evaluation. He reports feeling weak and feeling dehydrated yesterday. He denies focal weakness, his weakness is diffuse. He denies syncopal event but reports dizziness and cannot walk further than 32ft without needing to sit down. He was drinking sodas since this is something that is easy for him to acquire as he is homeless and relies on soup kitchens for food. He states he has been on insulin in the past but was switched to Metformin 1000 mg . He endorses adherence to Metformin the last few days. He endorses polydipsia and polyuria. He denies nausea, vomiting, changes in vision, abdominal pain, SOB, cough, rash, focal weakness.    He saw infectious disease (Dr. Carver Fila) on Monday 7/11 for HIV. He has been off his HIV medication for several months 2/2 substance use relapse (alcohol and crack cocaine). He was started on Tivicay and Descovy as well as Bactrim for OI prophylaxis. ID states no sign of oppurtunistic infections at that time.  He endorses sobriety for the last month, states he wants to feel better. Continues to smoke cigarettes. Reports had delirium tremens for two days after quitting alcohol. Patient had recent incarceration earlier this year and states was gaining weight and doing well when receiving medications consistently.   Patient is on Seroquel for schizophrenia. Recently not taking Seroquel because he is scared someone will hurt him while sedated while he lives on the streets. He is interested in trying a different medication.    ED Course: Patient was HDS on arrival with blood glucose 350. Patient was started on isotonic fluids, insulin gtt and IV potassium. UA with ketones and elevated urine glucose with no concerns for infection.  Beta-hydroxybutyric acid 5.23. Respiratory panel negative. CBC without leukocytosis and mild microcytic anemia. CMP significant for hyponatremia 130, bicarb 18 with anion gap 16, blood glucose 427.    Meds:   Current Outpatient Medications  Medication Instructions   atorvastatin (LIPITOR) 40 mg, Oral, Daily   dolutegravir (TIVICAY) 50 mg, Oral, Daily   emtricitabine-tenofovir AF (DESCOVY) 200-25 MG tablet 1 tablet, Oral, Daily   hydrOXYzine (VISTARIL) 50 mg, Daily at bedtime   metFORMIN (GLUCOPHAGE) 500 mg, Oral, 2 times daily with meals   QUEtiapine (SEROQUEL) 200 mg, Oral, Daily at bedtime   sulfamethoxazole-trimethoprim (BACTRIM DS) 800-160 MG tablet 1 tablet, Oral, Daily     Past Medical History:  Diagnosis Date   Diabetes  mellitus without complication (HCC)    Hepatitis C    HIV infection (HCC)    Schizophrenia (HCC)    Substance abuse (HCC)     Past Surgical History:  Procedure Laterality Date   ORIF MANDIBULAR FRACTURE Left 12/31/2019   Procedure: OPEN REDUCTION INTERNAL FIXATION (ORIF) MANDIBULAR FRACTURE , NASAL  MAXILLO MANDIBULAR FIXATION;  Surgeon: Rejeana Brockaldwell, William M, MD;  [facility];  Service: ENT;  Laterality: Left;    Social:  Lives:  Unhomed Occupation: unemployed Substances: Patient previously using alcohol, crack cocaine, quit 1 month ago. Still smokes cigarettes 1/2 PPD for 40 years.  Family History:  Family History  Problem Relation Age of Onset   Breast cancer Mother    Lung cancer Father    Heart attack Father      Allergies: Allergies as of 02/10/2021   (No Known Allergies)    Review of Systems: A complete ROS was negative except as per HPI.   OBJECTIVE:   Physical Exam: Blood pressure 130/82, pulse 88, temperature (!) 97.5 F (36.4 C), temperature source Oral, resp. rate 20, height 6\' 3"  (1.905 m), weight 58 kg, SpO2 100 %.  Constitutional: chronically ill african [place] male, in no acute distress HENT: normocephalic atraumatic, mucous membranes moist, tongue with poor oral hygiene Eyes: conjunctiva non-erythematous, eye patch in place Cardiovascular: regular rate and rhythm, no m/r/g Pulmonary/Chest: normal work of breathing on room air, lungs clear to auscultation bilaterally, occasional rhonchi Abdominal: soft, non-tender, non-distended MSK: reduced muscle bulk and normal tone Neurological: alert & oriented x 3, anti-gravity strength in bilateral upper and lower extremities, normal gait Skin: warm and dry, no rash/lesions on visible surfaces Psych: appropriate affect, linear though process, normal speech  Labs: CBC    Component Value Date/Time   WBC 5.7 02/10/2021 1214   RBC 4.04 (L) 02/10/2021 1214   HGB 12.9 (L) 02/10/2021 1447   HCT 38.0 (L) 02/10/2021 1447   PLT 338 02/10/2021 1214   MCV 90.3 02/10/2021 1214   MCH 30.0 02/10/2021 1214   MCHC 33.2 02/10/2021 1214   RDW 13.2 02/10/2021 1214   LYMPHSABS 1.9 02/10/2021 1214   MONOABS 0.6 02/10/2021 1214   EOSABS 0.0 02/10/2021 1214   BASOSABS 0.0 02/10/2021 1214     CMP     Component Value Date/Time   NA 134 (L) 02/10/2021 1447   K 4.4 02/10/2021 1447   CL 96 (L) 02/10/2021 1214   CO2 18 (L) 02/10/2021 1214   GLUCOSE 427  (H) 02/10/2021 1214   BUN 17 02/10/2021 1214   CREATININE 1.10 02/10/2021 1214   CREATININE 0.94 08/13/2020 1348   CALCIUM 8.8 (L) 02/10/2021 1214   PROT 8.2 (H) 02/10/2021 1214   ALBUMIN 2.5 (L) 02/10/2021 1214   AST 17 02/10/2021 1214   ALT 12 02/10/2021 1214   ALT 36 10/26/2017 1404   ALKPHOS 60 02/10/2021 1214   BILITOT 1.3 (H) 02/10/2021 1214   GFRNONAA >60 02/10/2021 1214   GFRNONAA 90 08/13/2020 1348   GFRAA 104 08/13/2020 1348   Beta-hydroxybutyric acid [[id number]][[id number]] (Abnormal) Collected: 02/10/21 1214  Specimen: Blood Updated: 02/10/21 1635   Beta-Hydroxybutyric Acid 5.23 High  mmol/L    Resp Panel by RT-PCR (Flu A&B, Covid) Nasopharyngeal Swab [[id number]][[id number]] Collected: 02/10/21 1437  Specimen: Nasopharyngeal(NP) swabs in vial transport medium from Nasopharyngeal Swab Updated: 02/10/21 1605   SARS Coronavirus 2 by RT PCR NEGATIVE   Influenza A by PCR NEGATIVE   Influenza B by PCR NEGATIVE   I-Stat Venous Blood  Gas, ED [[id number]] (Abnormal) Collected: 02/10/21 1447  Specimen: Blood Updated: 02/10/21 1450   pH, Ven 7.293   pCO2, Ven 44.5 mmHg    pO2, Ven 139.0 High  mmHg    Bicarbonate 21.5 mmol/L    TCO2 23 mmol/L    O2 Saturation 99.0 %    Acid-base deficit 5.0 High  mmol/L    Sodium 134 Low  mmol/L    Potassium 4.4 mmol/L    Calcium, Ion 1.16 mmol/L    HCT 38.0 Low  %    Hemoglobin 12.9 Low  g/dL    Sample type VENOUS   Magnesium [[id number]] Collected: 02/10/21 1214  Specimen: Blood Updated: 02/10/21 1406   Magnesium 1.7 mg/dL    Urinalysis, Routine w reflex microscopic Urine, Clean Catch [[id number]] (Abnormal) Collected: 02/10/21 1200  Specimen: Urine, Clean Catch Updated: 02/10/21 1333   Color, Urine STRAW Abnormal    APPearance CLEAR   Specific Gravity, Urine 1.023   pH 6.0   Glucose, UA >=500 Abnormal  mg/dL    Hgb urine dipstick NEGATIVE   Bilirubin Urine NEGATIVE   Ketones, ur 80 Abnormal  mg/dL    Protein, ur NEGATIVE mg/dL    Nitrite NEGATIVE    Leukocytes,Ua NEGATIVE   RBC / HPF 0-5 RBC/hpf    WBC, UA 0-5 WBC/hpf    Bacteria, UA RARE Abnormal    Mucus PRESENT   Imaging: No results found.   ASSESSMENT & PLAN:    Assessment & Plan by Problem: Active Problems:   Diabetic ketoacidosis (HCC)   ALYSSA MANCERA is a 60 y.o. 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 He endorses adherence to Metformin the last few days prior to presentation. Patient presented [facility] ED with generalized weakness, polydipsia, and polyuria. Reports drinking many full sugar sodas. Hgb A1c 7/11 was 13.2%. Blood glucose 350 on arrival, his blood glucose has improved with fluids and insulin gtt to 186. Patient also received potassium IV. Patient has anion gap metabolic acidosis with bicarb 18 and anion gap 16 likely 2/2 hyperglycemia. Beta hydroxybutyric acid 5.23. UA with ketones. Patient does not meet criteria for DKA, however given labs concerning for development of DKA, patient will be admitted for further hyperglycemic management. (Commonly accepted DKA criteria: blood glucose greater than 250 mg/dl, arterial pH less than 7.3, serum bicarbonate less than 15 mEq/l, and the presence of ketonemia or ketonuria)  -D5 133ml/hr -Insulin gtt -Holding home Metformin  #HIV AIDS Patient has history of AIDS. He saw infectious disease (Dr. Carver Fila) on Monday 7/11 for HIV. He has been off his HIV medication for several months 2/2 substance use relapse (alcohol and crack cocaine). He was started on Tivicay and Descovy as well as Bactrim for OI prophylaxis. ID  identified no sign of oppurtunistic infections at that time. Patient seems motivated to maintain sobriety and adhere to HIV medications. Last CD4 7/11 175, CD4 count less than 200 meets criteria for AIDS. RPR non reactive 7/11. -f/u HIV RNA ordered 7/11 -Tivicay 50mg  qd -Descovy 200-25 qd -Bactrim 800-160mg  qd -HIV RNA RTPCR pending  #HLD HX of HLD, lipid panel from 7/11 pending -restart home atorvastatin 40mg  -f/u  lipid panel  #Possible Thrush Patient with white plaques on tongue, no mouth discomfort reported, likely poor oral hygiene though since patient has AIDS there is concern for candida infection. -nystatin mouthwash   Diet: NPO VTE: Enoxaparin IVF: D5,125cc/hr Code: DNR  Prior to Admission Living Arrangement: Homeless Anticipated Discharge Location: SW will help with placement options Barriers to Discharge: on insulin gtt for hyperglycemia  Dispo: Admit patient to Observation with expected length of stay less than 2 midnights.  Ellison Carwin, MD 02/10/21, 5:54 PM  Pager: [phone] Internal Medicine Resident, PGY-1 [facility]

## 2021-02-11 ENCOUNTER — Observation Stay (HOSPITAL_COMMUNITY): Payer: Medicaid Other

## 2021-02-11 ENCOUNTER — Encounter (HOSPITAL_COMMUNITY): Payer: Self-pay | Admitting: Internal Medicine

## 2021-02-11 DIAGNOSIS — F17210 Nicotine dependence, cigarettes, uncomplicated: Secondary | ICD-10-CM | POA: Diagnosis present

## 2021-02-11 DIAGNOSIS — E43 Unspecified severe protein-calorie malnutrition: Secondary | ICD-10-CM | POA: Diagnosis present

## 2021-02-11 DIAGNOSIS — B370 Candidal stomatitis: Secondary | ICD-10-CM | POA: Diagnosis present

## 2021-02-11 DIAGNOSIS — E1110 Type 2 diabetes mellitus with ketoacidosis without coma: Principal | ICD-10-CM

## 2021-02-11 DIAGNOSIS — Z79899 Other long term (current) drug therapy: Secondary | ICD-10-CM | POA: Diagnosis not present

## 2021-02-11 DIAGNOSIS — E119 Type 2 diabetes mellitus without complications: Secondary | ICD-10-CM | POA: Diagnosis not present

## 2021-02-11 DIAGNOSIS — Z20822 Contact with and (suspected) exposure to covid-19: Secondary | ICD-10-CM | POA: Diagnosis present

## 2021-02-11 DIAGNOSIS — Z681 Body mass index (BMI) 19 or less, adult: Secondary | ICD-10-CM | POA: Diagnosis not present

## 2021-02-11 DIAGNOSIS — F209 Schizophrenia, unspecified: Secondary | ICD-10-CM | POA: Diagnosis present

## 2021-02-11 DIAGNOSIS — B20 Human immunodeficiency virus [HIV] disease: Secondary | ICD-10-CM

## 2021-02-11 DIAGNOSIS — Z7984 Long term (current) use of oral hypoglycemic drugs: Secondary | ICD-10-CM | POA: Diagnosis not present

## 2021-02-11 DIAGNOSIS — Z560 Unemployment, unspecified: Secondary | ICD-10-CM | POA: Diagnosis not present

## 2021-02-11 DIAGNOSIS — K739 Chronic hepatitis, unspecified: Secondary | ICD-10-CM | POA: Diagnosis present

## 2021-02-11 DIAGNOSIS — G4700 Insomnia, unspecified: Secondary | ICD-10-CM | POA: Diagnosis not present

## 2021-02-11 DIAGNOSIS — E785 Hyperlipidemia, unspecified: Secondary | ICD-10-CM | POA: Diagnosis present

## 2021-02-11 DIAGNOSIS — Z5900 Homelessness unspecified: Secondary | ICD-10-CM | POA: Diagnosis not present

## 2021-02-11 DIAGNOSIS — E871 Hypo-osmolality and hyponatremia: Secondary | ICD-10-CM | POA: Diagnosis present

## 2021-02-11 LAB — BETA-HYDROXYBUTYRIC ACID
Beta-Hydroxybutyric Acid: 0.49 mmol/L — ABNORMAL HIGH (ref 0.05–0.27)
Beta-Hydroxybutyric Acid: 0.75 mmol/L — ABNORMAL HIGH (ref 0.05–0.27)
Beta-Hydroxybutyric Acid: 1.63 mmol/L — ABNORMAL HIGH (ref 0.05–0.27)
Beta-Hydroxybutyric Acid: 1.62 mmol/L — ABNORMAL HIGH (ref 0.05–0.27)

## 2021-02-11 LAB — BASIC METABOLIC PANEL
Anion gap: 6 (ref 5–15)
Anion gap: 7 (ref 5–15)
Anion gap: 7 (ref 5–15)
Anion gap: 8 (ref 5–15)
BUN: 7 mg/dL (ref 6–20)
BUN: 6 mg/dL (ref 6–20)
BUN: 9 mg/dL (ref 6–20)
BUN: 9 mg/dL (ref 6–20)
CO2: 23 mmol/L (ref 22–32)
CO2: 24 mmol/L (ref 22–32)
CO2: 21 mmol/L — ABNORMAL LOW (ref 22–32)
CO2: 23 mmol/L (ref 22–32)
Calcium: 8.5 mg/dL — ABNORMAL LOW (ref 8.9–10.3)
Calcium: 8.8 mg/dL — ABNORMAL LOW (ref 8.9–10.3)
Calcium: 8.4 mg/dL — ABNORMAL LOW (ref 8.9–10.3)
Calcium: 8.5 mg/dL — ABNORMAL LOW (ref 8.9–10.3)
Chloride: 102 mmol/L (ref 98–111)
Chloride: 103 mmol/L (ref 98–111)
Chloride: 101 mmol/L (ref 98–111)
Chloride: 100 mmol/L (ref 98–111)
Creatinine, Ser: 0.64 mg/dL (ref 0.61–1.24)
Creatinine, Ser: 0.67 mg/dL (ref 0.61–1.24)
Creatinine, Ser: 0.73 mg/dL (ref 0.61–1.24)
Creatinine, Ser: 0.77 mg/dL (ref 0.61–1.24)
GFR, Estimated: >60 mL/min (ref >60)
GFR, Estimated: >60 mL/min (ref >60)
GFR, Estimated: >60 mL/min (ref >60)
GFR, Estimated: >60 mL/min (ref >60)
Glucose, Bld: 187 mg/dL — ABNORMAL HIGH (ref 70–99)
Glucose, Bld: 123 mg/dL — ABNORMAL HIGH (ref 70–99)
Glucose, Bld: 319 mg/dL — ABNORMAL HIGH (ref 70–99)
Glucose, Bld: 240 mg/dL — ABNORMAL HIGH (ref 70–99)
Potassium: 3.1 mmol/L — ABNORMAL LOW (ref 3.5–5.1)
Potassium: 3.6 mmol/L (ref 3.5–5.1)
Potassium: 3.5 mmol/L (ref 3.5–5.1)
Potassium: 3.9 mmol/L (ref 3.5–5.1)
Sodium: 131 mmol/L — ABNORMAL LOW (ref 135–145)
Sodium: 134 mmol/L — ABNORMAL LOW (ref 135–145)
Sodium: 129 mmol/L — ABNORMAL LOW (ref 135–145)
Sodium: 131 mmol/L — ABNORMAL LOW (ref 135–145)

## 2021-02-11 LAB — GLUCOSE, CAPILLARY
Glucose-Capillary: 156 mg/dL — ABNORMAL HIGH (ref 70–99)
Glucose-Capillary: 142 mg/dL — ABNORMAL HIGH (ref 70–99)
Glucose-Capillary: 160 mg/dL — ABNORMAL HIGH (ref 70–99)
Glucose-Capillary: 154 mg/dL — ABNORMAL HIGH (ref 70–99)
Glucose-Capillary: 245 mg/dL — ABNORMAL HIGH (ref 70–99)
Glucose-Capillary: 225 mg/dL — ABNORMAL HIGH (ref 70–99)
Glucose-Capillary: 302 mg/dL — ABNORMAL HIGH (ref 70–99)
Glucose-Capillary: 315 mg/dL — ABNORMAL HIGH (ref 70–99)

## 2021-02-11 LAB — URINALYSIS, COMPLETE (UACMP) WITH MICROSCOPIC
Bacteria, UA: NONE SEEN
Bilirubin Urine: NEGATIVE
Glucose, UA: >=500 mg/dL — AB
Hgb urine dipstick: NEGATIVE
Ketones, ur: 5 mg/dL — AB
Leukocytes,Ua: NEGATIVE
Nitrite: NEGATIVE
Protein, ur: NEGATIVE mg/dL
Specific Gravity, Urine: 1.012 (ref 1.005–1.030)
pH: 7 (ref 5.0–8.0)

## 2021-02-11 LAB — CBC
HCT: 33.2 % — ABNORMAL LOW (ref 39.0–52.0)
Hemoglobin: 11.7 g/dL — ABNORMAL LOW (ref 13.0–17.0)
MCH: 30.4 pg (ref 26.0–34.0)
MCHC: 35.2 g/dL (ref 30.0–36.0)
MCV: 86.2 fL (ref 80.0–100.0)
Platelets: 319 10*3/uL (ref 150–400)
RBC: 3.85 MIL/uL — ABNORMAL LOW (ref 4.22–5.81)
RDW: 12.8 % (ref 11.5–15.5)
WBC: 4.9 10*3/uL (ref 4.0–10.5)
nRBC: 0 % (ref 0.0–0.2)

## 2021-02-11 MED ORDER — INSULIN ASPART 100 UNIT/ML IJ SOLN
3.0000 [IU] | Freq: Three times a day (TID) | INTRAMUSCULAR | Status: DC
  Administered 2021-02-11: 3 [IU] via SUBCUTANEOUS

## 2021-02-11 MED ORDER — ADULT MULTIVITAMIN W/MINERALS CH
1.0000 | ORAL_TABLET | Freq: Every day | ORAL | Status: DC
  Administered 2021-02-11 – 2021-02-15 (×5): 1 via ORAL
  Filled 2021-02-11 (×5): qty 1

## 2021-02-11 MED ORDER — FLUCONAZOLE 100 MG PO TABS
200.0000 mg | ORAL_TABLET | Freq: Every day | ORAL | Status: DC
  Administered 2021-02-11 – 2021-02-15 (×5): 200 mg via ORAL
  Filled 2021-02-11 (×6): qty 2

## 2021-02-11 MED ORDER — INSULIN ASPART 100 UNIT/ML IJ SOLN
0.0000 [IU] | Freq: Three times a day (TID) | INTRAMUSCULAR | Status: DC
  Administered 2021-02-11: 15 [IU] via SUBCUTANEOUS
  Administered 2021-02-12: 4 [IU] via SUBCUTANEOUS
  Administered 2021-02-12: 20 [IU] via SUBCUTANEOUS
  Administered 2021-02-12: 7 [IU] via SUBCUTANEOUS
  Administered 2021-02-13: 15 [IU] via SUBCUTANEOUS
  Administered 2021-02-13 – 2021-02-14 (×2): 20 [IU] via SUBCUTANEOUS
  Administered 2021-02-14: 4 [IU] via SUBCUTANEOUS
  Administered 2021-02-14 – 2021-02-15 (×3): 7 [IU] via SUBCUTANEOUS
  Administered 2021-02-16: 11 [IU] via SUBCUTANEOUS

## 2021-02-11 MED ORDER — GLUCERNA SHAKE PO LIQD
237.0000 mL | Freq: Three times a day (TID) | ORAL | Status: DC
  Administered 2021-02-11 – 2021-02-15 (×12): 237 mL via ORAL

## 2021-02-11 MED ORDER — INSULIN ASPART 100 UNIT/ML IJ SOLN
0.0000 [IU] | Freq: Every day | INTRAMUSCULAR | Status: DC
  Administered 2021-02-11 – 2021-02-13 (×2): 4 [IU] via SUBCUTANEOUS
  Administered 2021-02-14: 2 [IU] via SUBCUTANEOUS

## 2021-02-11 MED ORDER — POTASSIUM CHLORIDE CRYS ER 20 MEQ PO TBCR
40.0000 meq | EXTENDED_RELEASE_TABLET | Freq: Once | ORAL | Status: AC
  Administered 2021-02-11: 40 meq via ORAL
  Filled 2021-02-11: qty 2

## 2021-02-11 MED ORDER — INSULIN GLARGINE 100 UNIT/ML ~~LOC~~ SOLN
15.0000 [IU] | Freq: Every day | SUBCUTANEOUS | Status: DC
  Administered 2021-02-11: 15 [IU] via SUBCUTANEOUS
  Filled 2021-02-11 (×2): qty 0.15

## 2021-02-11 MED ORDER — TRAZODONE HCL 50 MG PO TABS
50.0000 mg | ORAL_TABLET | Freq: Every day | ORAL | Status: DC
  Administered 2021-02-11 – 2021-02-15 (×5): 50 mg via ORAL
  Filled 2021-02-11 (×5): qty 1

## 2021-02-11 MED ORDER — INSULIN GLARGINE 100 UNIT/ML ~~LOC~~ SOLN
15.0000 [IU] | SUBCUTANEOUS | Status: DC
  Administered 2021-02-11: 15 [IU] via SUBCUTANEOUS
  Filled 2021-02-11 (×2): qty 0.15

## 2021-02-11 MED ORDER — INSULIN ASPART 100 UNIT/ML IJ SOLN
0.0000 [IU] | Freq: Three times a day (TID) | INTRAMUSCULAR | Status: DC
  Administered 2021-02-11 (×2): 5 [IU] via SUBCUTANEOUS

## 2021-02-11 NOTE — Progress Notes (Signed)
Inpatient Diabetes Program Recommendations  AACE/ADA: New Consensus Statement on Inpatient Glycemic Control (2015)  Target Ranges:  Prepandial:   less than 140 mg/dL      Peak postprandial:   less than 180 mg/dL (1-2 hours)      Critically ill patients:  140 - 180 mg/dL   Lab Results  Component Value Date   GLUCAP 225 (H) 02/11/2021   HGBA1C 13.2 (H) 02/08/2021    Review of Glycemic Control Results for Derrick Cooper, Derrick Cooper (MRN [id number]) as of 02/11/2021 14:23  Ref. Range 02/11/2021 05:16 02/11/2021 07:27 02/11/2021 12:15  Glucose-Capillary Latest Ref Range: 70 - 99 mg/dL 473 (H) 403 (H) 709 (H)   Diabetes history: Type 2 DM Outpatient Diabetes medications: Metformin 500 mg BID Current orders for Inpatient glycemic control: Lantus 15 units QHS, Novolog 0-15 units TID  Inpatient Diabetes Program Recommendations:    Consider increasing Lantus to 18 units QD.   Spoke with patient regarding diabetes management. Patient verified home medications, however, was not taking Metformin because "I was out on the streets getting high; Im ready to take control again." Has used insulin pens in the past and feels confident with self injections.  Reviewed patient's current A1c of 13.2%. Explained what a A1c is and what it measures. Also reviewed goal A1c with patient, importance of good glucose control @ home, and blood sugar goals. Reviewed patho of DM, need for insulin, role of pancreas, DKA, survival skills, interventions, impact of ETOH on liver with insulin, vascular changes and commorbidities. Patient will need a meter at discharge. Blood glucose meter (#[id number]). Reviewed frequency of checking and when to call MD. [facility] consult placed for PCP follow up, transportation to appointments and medication assistance.  Anticipate need for insulin at discharge. Again, patient has used insulin pens in the past. With drug use and homelessness, would not recommend insulin syringes.  Patient admits to drinking sugary  beverages because of ease of access. Has access to water and strongly encouraged to use this as an alterative. Encouraged mindfulness of CHO; dietitian to see patient.  Patient has no further questions at this time.  Thanks, Lujean Rave, MSN, RNC-OB Diabetes Coordinator [phone] (8a-5p)
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
## 2021-02-11 NOTE — Progress Notes (Addendum)
Subjective: Patient reports feeling better this morning with improvement in weakness. He is eating and ambulating appropriately. No nausea/vomiting. No dizziness. Patient denies visual and auditory hallucinations.   Objective:  Vital signs in last 24 hours: Vitals:   02/10/21 2356 02/11/21 0313 02/11/21 0728 02/11/21 1216  BP: 133/87 113/68 107/72 118/89  Pulse: 85 84 93 87  Resp: 20 20 20 20   Temp: 98.7 F (37.1 C) 98 F (36.7 C) 97.7 F (36.5 C) 98.1 F (36.7 C)  TempSrc: Oral Oral Oral Oral  SpO2: 100% 98%  100%  Weight:      Height:        Intake/Output Summary (Last 24 hours) at 02/11/2021 1544 Last data filed at 02/11/2021 02/13/2021 Gross per 24 hour  Intake 50 ml  Output 975 ml  Net -925 ml   Physical Exam: Constitutional: chronically ill african [age] male, in no acute distress, malnourished HENT: normocephalic atraumatic, mucous membranes moist, tongue with poor oral hygiene/white plaques non erythematous Eyes: conjunctiva non-erythematous, eye patch in place L eye Cardiovascular: regular rate and rhythm, no m/r/g Pulmonary/Chest: normal work of breathing on room air, lungs clear to auscultation bilaterally, occasional rhonchi Abdominal: soft, non-tender, non-distended MSK: reduced muscle bulk and normal tone Neurological: alert & oriented x 3, anti-gravity strength in bilateral upper and lower extremities, normal gait Skin: warm and dry, no rash/lesions on visible surfaces Psych: appropriate affect, linear though process, normal speech, no VHA  CBC Latest Ref Rng & Units 02/11/2021 02/10/2021 02/10/2021  WBC 4.0 - 10.5 K/uL 4.9 - 5.7  Hemoglobin 13.0 - 17.0 g/dL 11.7(L) 12.9(L) 12.1(L)  Hematocrit 39.0 - 52.0 % 33.2(L) 38.0(L) 36.5(L)  Platelets 150 - 400 K/uL 319 - 338   CMP Latest Ref Rng & Units 02/11/2021 02/11/2021 02/11/2021  Glucose 70 - 99 mg/dL 02/13/2021) 619(J) 093(O)  BUN 6 - 20 mg/dL 9 9 6   Creatinine 0.61 - 1.24 mg/dL 671(I 4.58  Sodium 135 - 145  mmol/L 131(L) 129(L) 134(L)  Potassium 3.5 - 5.1 mmol/L 3.9 3.5 3.6  Chloride 98 - 111 mmol/L 100 101 103  CO2 22 - 32 mmol/L 23 21(L) 24  Calcium 8.9 - 10.3 mg/dL 0.99) 8.33) 8.2(N)  Total Protein 6.5 - 8.1 g/dL - - -  Total Bilirubin 0.3 - 1.2 mg/dL - - -  Alkaline Phos 38 - 126 U/L - - -  AST 15 - 41 U/L - - -  ALT 0 - 44 U/L - - -   Urinalysis, Routine w reflex microscopic Urine, Clean Catch 0.5(L (Abnormal) Collected: 02/10/21 1200  Specimen: Urine, Clean Catch Updated: 02/10/21 1333   Color, Urine STRAW Abnormal    APPearance CLEAR   Specific Gravity, Urine 1.023   pH 6.0   Glucose, UA >=500 Abnormal  mg/dL    Hgb urine dipstick NEGATIVE   Bilirubin Urine NEGATIVE   Ketones, ur 80 Abnormal  mg/dL    Protein, ur NEGATIVE mg/dL    Nitrite NEGATIVE   Leukocytes,Ua NEGATIVE   RBC / HPF 0-5 RBC/hpf    WBC, UA 0-5 WBC/hpf    Bacteria, UA RARE Abnormal    Mucus PRESENT   I-Stat Venous Blood Gas, ED 02/12/21 (Abnormal) Collected: 02/10/21 1447  Specimen: Blood Updated: 02/10/21 1450   pH, Ven 7.293   pCO2, Ven 44.5 mmHg    pO2, Ven 139.0 High  mmHg    Bicarbonate 21.5 mmol/L    TCO2 23 mmol/L    O2 Saturation 99.0 %  Acid-base deficit 5.0 High  mmol/L    Sodium 134 Low  mmol/L    Potassium 4.4 mmol/L    Calcium, Ion 1.16 mmol/L    HCT 38.0 Low  %    Hemoglobin 12.9 Low  g/dL    Sample type VENOUS   Beta-hydroxybutyric acid [[id number]] (Abnormal) Collected: 02/10/21 1214  Specimen: Blood Updated: 02/10/21 1635   Beta-Hydroxybutyric Acid 5.23 High  mmol/L    Beta-hydroxybutyric acid [[id number]] (Abnormal) Collected: 02/10/21 2159  Specimen: Blood Updated: 02/10/21 2315   Beta-Hydroxybutyric Acid 3.16 High  mmol/L    Beta-hydroxybutyric acid [[id number]] (Abnormal) Collected: 02/11/21 0124  Specimen: Blood Updated: 02/11/21 0228   Beta-Hydroxybutyric Acid 0.49 High  mmol/L    Beta-hydroxybutyric acid [[id number]] (Abnormal) Collected: 02/11/21 0601   Specimen: Blood Updated: 02/11/21 0720   Beta-Hydroxybutyric Acid 0.75 High  mmol/L    Beta-hydroxybutyric acid [[id number]] (Abnormal) Collected: 02/11/21 1250  Specimen: Blood Updated: 02/11/21 1400   Beta-Hydroxybutyric Acid 1.62 High  mmol/L    DG Chest 2 View  Result Date: 02/11/2021 CLINICAL DATA:  Hyperglycemia, weakness, dizziness EXAM: CHEST - 2 VIEW COMPARISON:  10/23/2020 FINDINGS: The heart size and mediastinal contours are within normal limits. Both lungs are clear. The visualized skeletal structures are unremarkable. IMPRESSION: No acute abnormality of the lungs. Electronically Signed   By: Lauralyn Primes M.D.   On: 02/11/2021 10:33     Assessment/Plan:  Active Problems:   Diabetic ketoacidosis (HCC)  Derrick Cooper is a 60 y.o. 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  #Diabetic ketoacidosis #Poorly controlled T2DM Patient has history of T2DM, previously on insulin though currently on Metformin 500mg  BID. He endorses adherence to Metformin. Patient presented [facility] ED with generalized weakness, polydipsia, and polyuria. Reports drinking many full sugar sodas. Hgb A1c 7/11 was 13.2%. Blood glucose 350 on arrival with anion gap metabolic acidosis with bicarb 18 and anion gap 16, VBG pH 7.29, UA with ketones. His blood glucose has improved with fluids and insulin gtt, anion gap closed, transitioned to SQ insulin. Beta hydroxybutyric acid 5.23 on admission is down trending. Patient had improvement of generalized weakness symptoms. Patient eating and ambulating. -Lantus 15u -Novolog 3u with meals -SSI -Appreciate diabetes coordinator recommendations -Patient will need glucose meter at discharge -TOC consult placed  #HIV AIDS Patient has history of  AIDS. He saw infectious disease (Dr. 9/11) on Monday 7/11 for HIV. He has been off his HIV medication for several months 2/2 substance use relapse (alcohol and crack cocaine). He was started on Tivicay and Descovy as well as Bactrim for OI prophylaxis. ID identified no sign of oppurtunistic infections at that time. Patient seems motivated to maintain sobriety and adhere to HIV medications. Last CD4 7/11 175, CD4 count less than 200 meets criteria for AIDS. RPR non reactive 7/11. HIV RNA PCR ordered by ID at clinic visit still pending.  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 Patient afebrile without leukocytosis. We ordered infectious workup. UA with rare bacteria, blood cultures pending,  urine culture pending, CXR clear without any acute abnormalities. -f/u blood cultures -f/u urine culture -continue to monitor vitals and for signs of infection, the hallmark of the syndrome is paradoxical worsening of an existing infection or disease process or appearance of a new infection/disease process soon after initiation of ART.   #Thrush Patient with white plaques on tongue on exam, since patient has AIDS there is concern for candida infection.  -fluconazole 200mg  qd -nystatin mouthwash  #Severe Malnutrition 2/2 chronic illnesses Patient with BMI 15.98 with severe fat depletion, severe muscle depletion. Pt has lost ~22 lbs (14.5%) in the past year. Not necessarily significant for the time frame, but it is concerning.  -Dietitian gave recommendations, appreciate recs -glucerna nutritional supps  #Polysubstance abuse Patient reports previous heavy alcohol and crack  cocaine use. Patient states last alcohol and cocaine use was one month ago. He had Dts for two days after quitting. He continues to smoke cigarettes 1/2 PPD. -Nicotine patch  #HLD Patient has history of HLD. He was restarted on home atorvastatin 40mg . Patient has lipid panel pending from recent ID clinic visit.  -atorvastatin 40mg   #Hx of Psychosis Patient has history of reported schizophrenia. However on our examination patient with linear thought process, without hallucinations or paranoia. Given history of crack cocaine use, patient likely had component of drug induced psychosis. Patient was prescribed Seroquel though does not take this due to side effect of sedation. Since patient endorses sobriety and without signs of psychosis on psych exam, we are holding antipsychotics.Will start trazodone for sleep. -Trazodone 50mg  qhs   Prior to Admission Living Arrangement: unhomed Anticipated Discharge Location:  Barriers to Discharge: infectious workup pending, continuing to manage hyperglycemia Dispo: Anticipated discharge in approximately 1-2 day(s).   , MD 02/11/21, 4:11 PM  Pager: [phone] Internal Medicine Resident, PGY-1 Internal Medicine

## 2021-02-11 NOTE — Discharge Summary (Signed)
Name: Derrick Cooper MRN: [id number] DOB: 03/29/1961 60 y.o. PCP: Annye English  Date of Admission: 02/10/2021 11:51 AM Date of Discharge: 02/15/2021 Attending Physician: Sid Falcon, MD  Discharge Diagnosis: 1. DKA 2. HIV/AIDS 3. Thrush 4. Severe Malnutrition  Discharge Medications: Allergies as of 02/15/2021   No Known Allergies      Medication List     STOP taking these medications    hydrOXYzine 50 MG capsule Commonly known as: VISTARIL   QUEtiapine 200 MG tablet Commonly known as: SEROQUEL       TAKE these medications    Accu-Chek Guide test strip Generic drug: glucose blood Use as instructed up to 4 times daily   Accu-Chek Guide w/Device Kit Use as directed   Accu-Chek Softclix Lancets lancets Use as directed up to 4 times daily   atorvastatin 40 MG tablet Commonly known as: LIPITOR Take 40 mg by mouth daily.   dolutegravir 50 MG tablet Commonly known as: TIVICAY Take 1 tablet (50 mg total) by mouth daily.   emtricitabine-tenofovir AF 200-25 MG tablet Commonly known as: DESCOVY Take 1 tablet by mouth daily.   fluconazole 200 MG tablet Commonly known as: DIFLUCAN Take 1 tablet (200 mg total) by mouth daily for 9 days. Start taking on: February 16, 2021   insulin aspart 100 UNIT/ML injection Commonly known as: novoLOG Inject 20 Units into the skin 3 (three) times daily with meals.   insulin glargine 100 UNIT/ML Solostar Pen Commonly known as: LANTUS Inject 45 Units into the skin at bedtime.   metFORMIN 500 MG tablet Commonly known as: GLUCOPHAGE Take 2 tablets (1,000 mg total) by mouth 2 (two) times daily with a meal. What changed: how much to take   Pentips 32G X 4 MM Misc Generic drug: Insulin Pen Needle Use as directed   sulfamethoxazole-trimethoprim 800-160 MG tablet Commonly known as: BACTRIM DS Take 1 tablet by mouth daily.        Disposition and follow-up:   [name] was discharged from [facility] in Stable condition.  At the hospital follow up visit please address:  1. DKA: Patient with blood glucose to 427, VBG pH 4.29, Bicarb 18, with ketonuria, presenting with generalized weakness. Patient was started on fluids and insulin gtt, with improvement in blood glucose. Patient transitioned to SQ insulin. Patient was admitted for several days of hyperglycemic management. Patient was discharged on 45u lantus qhs and 20u novolog with meals as well as metformin $RemoveBefo'500mg'[id number]$  BID. Patient will have close follow up with [facility] to f/u on diabetes management HIV/AIDS: Patient re-established with ID 7/11. Patient was restarted on home Tivicay and Descovy, as well as Bactrim OI ppx. Thrush Patient started on 2 weeks of fluconazole PO to finish on 7/28. Patient should follow up with PCP to check for resolution of thrush.  2.  Labs / imaging needed at time of follow-up: BMP, blood glucose  3.  Pending labs/ test needing follow-up:  lipid panel, HCV RNA quant  Follow-up Appointments:  Follow-up Information     [facility] Follow up.   Why: Our office will call you for a hospital follow up appointment within one week. Please call [phone] if you have not heard from our office. Contact information: [address] [place], FNP Follow up.   Specialties: Family Medicine, Infectious Diseases Why: Please continue to follow up with  your infectious disease team. Your next appointment is 03/11/2021 Contact information: [address] [place] [address] [place] Hospital Course by problem list:  #Diabetic ketoacidosis #Poorly controlled T2DM Derrick Cooper is a 60 y.o. 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 Patient has history of T2DM and has previously required insulin though currently only on Metformin. He endorses adherence to Metformin the last few days prior to presentation. Patient presented [facility] ED with generalized weakness, polydipsia, and polyuria. Reports drinking 20 full sugar sodas/24hrs. Hgb A1c 7/11 was 13.2%. Blood glucose 350 on arrival with anion gap metabolic acidosis with bicarb 18 and anion gap 16, VBG pH 7.29, UA with ketones. His blood glucose has improved with fluids and insulin gtt, anion gap closed, transitioned to SQ insulin. Patient also received potassium IV. Beta hydroxybutyric acid 5.23 on admission has down trended to 0.75. Patient had improvement of weakness symptoms. Patient eating and ambulating. Patient stable and discharged on 02/15/2021. Patient discharged on Metformin 500 BID, lantus 45 units qhs, and 20u novolog with meals. Patient will be scheduled for close follow up with PCP for long term diabetes management.  #HIV AIDS Patient has history of AIDS. He saw infectious disease (Dr. Elna Breslow) on Monday 7/11 for HIV. He has been off his HIV medication for several months 2/2 substance use relapse (alcohol and crack cocaine). He was started on Tivicay and Descovy as well as Bactrim for OI prophylaxis. ID identified no sign of oppurtunistic infections at that time. Patient seems motivated to maintain sobriety and adhere to HIV medications. Last CD4 7/11 175, CD4 count less than 200 meets criteria for AIDS. RPR non reactive 7/11. HIV RNA PCR ordered by ID at clinic visit still pending. This hospitalization patient was restarted on home dose Tivicay and Descovy as well as Bactrim for OI ppx.  #Concern for IRIS Given history of untreated HIV AIDS with recent CD4 count 175, newly restarted on HIV medications on 7/11, presenting with DKA without clear trigger, we are concerned patient has underlying infection that is now amounting an immune response with start of HIV medication.  Patient afebrile without leukocytosis. We did infectious workup. UA with rare bacteria, blood cultures NGTD, urine culture with multiples species, CXR clear without any acute abnormalities. Patient without acute symptoms concerning for IRIS. Patient should follow up with ID and PCP if patient starts having inflammatory symptoms concerning for IRIS. Less likely in patient starting on ART with CD4 count >100.    #Thrush Patient with white plaques on tongue on exam, since patient has AIDS there is concern for candida infection. Patient was started on PO fluconazole. Patient started on 7/14 and will continue on medication until 7/28 for total 2 week course.  Patient should follow up with PCP to ensure resolution of thrush.  #Severe Malnutrition 2/2 chronic illnesses Patient with BMI 15.98 with severe fat depletion, severe muscle depletion. Pt has lost ~22 lbs (14.5%) in the past year. Not necessarily significant for the time frame, but it is concerning. Patient started on Glucerna shakes feeding supplement while hospitalized.  #Polysubstance abuse Patient reports previous heavy alcohol and crack cocaine use. Patient states last alcohol and cocaine use was one month ago. He had Dts for two days after quitting. He continues to smoke  cigarettes 1/2 PPD. Patient was provided with nicotine patch during hospitalization.   #HLD Patient has history of HLD. He was restarted on home atorvastatin 43m. Patient has lipid panel pending from recent ID clinic visit.   #Hx of Psychosis Patient has history of reported schizophrenia. However on our examination patient with linear thought process, without hallucinations or paranoia. Given history of crack cocaine use, patient likely had component of drug induced psychosis. Patient was prescribed Seroquel though does not take this due to side effect of sedation. Since patient endorses sobriety and without signs of psychosis on psych exam, we are holding antipsychotics.      Subjective on day of discharge:  Overnight:no acute events or concerns overnight.  Patient reports feeling better this morning. He endorses dizziness with sitting up and standing though this resolves if he waits a moment before standing and walking. He was ambulating around the room and eating appropriately. He reports his PO intake is better on some days and worse on others. He is agreeable to discharge later this afternoon.   Discharge Exam:   BP 105/75 (BP Location: Right Arm)   Pulse 95   Temp 97.8 F (36.6 C) (Oral)   Resp 18   Ht _0  (1.905 m)   Wt 58 kg   SpO2 100%   BMI 15.98 kg/m  Physical Exam: Constitutional: chronically ill [place], in no acute distress, malnourished HENT: normocephalic atraumatic, mucous membranes moist, tongue with poor oral hygiene/white plaques non erythematous Eyes: conjunctiva non-erythematous, eye patch in place L eye Cardiovascular: regular rate and rhythm, no m/r/g Pulmonary/Chest: normal work of breathing on room air, lungs clear to auscultation bilaterally, occasional rhonchi Abdominal: soft, non-tender, non-distended MSK: reduced muscle bulk and normal tone Neurological: alert & oriented x 3, anti-gravity strength in bilateral upper and lower extremities, normal gait Skin: warm and dry, no rash/lesions on visible surfaces Psych: appropriate affect, linear though process, normal speech   Pertinent Labs, Studies, and Procedures:   CBC Latest Ref Rng & Units 02/13/2021 02/12/2021 02/11/2021  WBC 4.0 - 10.5 K/uL 5.3 4.0 4.9  Hemoglobin 13.0 - 17.0 g/dL 11.4(L) 11.4(L) 11.7(L)  Hematocrit 39.0 - 52.0 % 32.3(L) 31.8(L) 33.2(L)  Platelets 150 - 400 K/uL 307 300 319   BMP Latest Ref Rng & Units 02/15/2021 02/13/2021 02/12/2021  Glucose 70 - 99 mg/dL 217(H) 328(H) 297(H)  BUN 6 - 20 mg/dL _1 Creatinine 0.61 - 1.24 mg/dL 0.81 0.77 0.73  BUN/Creat Ratio 6 - 22 (calc) - - -  Sodium 135 - 145 mmol/L 132(L) 129(L) 131(L)   Potassium 3.5 - 5.1 mmol/L 4.1 4.3 3.9  Chloride 98 - 111 mmol/L 101 100 102  CO2 22 - 32 mmol/L _2 Calcium 8.9 - 10.3 mg/dL 9.5 9.0 8.8(L)   Magnesium [[[id number]]Collected: 02/10/21 1214  Specimen: Blood Updated: 02/10/21 1406   Magnesium 1.7 mg/dL    Resp Panel by RT-PCR (Flu A&B, Covid) Nasopharyngeal Swab [[[id number]]Collected: 02/10/21 1437  Specimen: Nasopharyngeal(NP) swabs in vial transport medium from Nasopharyngeal Swab Updated: 02/10/21 1605   SARS Coronavirus 2 by RT PCR NEGATIVE   Influenza A by PCR NEGATIVE   Influenza B by PCR NEGATIVE   Urinalysis, Routine w reflex microscopic Urine, Clean Catch [[[id number]](Abnormal) Collected: 02/10/21 1200  Specimen: Urine, Clean Catch Updated: 02/10/21 1333   Color, Urine STRAW Abnormal    APPearance CLEAR   Specific Gravity, Urine 1.023   pH 6.0   Glucose, UA >=500  Abnormal  mg/dL    Hgb urine dipstick NEGATIVE   Bilirubin Urine NEGATIVE   Ketones, ur 80 Abnormal  mg/dL    Protein, ur NEGATIVE mg/dL    Nitrite NEGATIVE   Leukocytes,Ua NEGATIVE   RBC / HPF 0-5 RBC/hpf    WBC, UA 0-5 WBC/hpf    Bacteria, UA RARE Abnormal    Mucus PRESENT   I-Stat Venous Blood Gas, ED [[id number]] (Abnormal) Collected: 02/10/21 1447  Specimen: Blood Updated: 02/10/21 1450   pH, Ven 7.293   pCO2, Ven 44.5 mmHg    pO2, Ven 139.0 High  mmHg    Bicarbonate 21.5 mmol/L    TCO2 23 mmol/L    O2 Saturation 99.0 %    Acid-base deficit 5.0 High  mmol/L    Sodium 134 Low  mmol/L    Potassium 4.4 mmol/L    Calcium, Ion 1.16 mmol/L    HCT 38.0 Low  %    Hemoglobin 12.9 Low  g/dL    Sample type VENOUS   Beta-hydroxybutyric acid [[id number]] (Abnormal) Collected: 02/10/21 1214  Specimen: Blood Updated: 02/10/21 1635   Beta-Hydroxybutyric Acid 5.23 High  mmol/L    Beta-hydroxybutyric acid [[id number]] (Abnormal) Collected: 02/10/21 2159  Specimen: Blood Updated: 02/10/21 2315   Beta-Hydroxybutyric Acid 3.16 High  mmol/L     Beta-hydroxybutyric acid [[id number]] (Abnormal) Collected: 02/11/21 0124  Specimen: Blood Updated: 02/11/21 0228   Beta-Hydroxybutyric Acid 0.49 High  mmol/L    Beta-hydroxybutyric acid [[id number]] (Abnormal) Collected: 02/11/21 0601  Specimen: Blood Updated: 02/11/21 0720   Beta-Hydroxybutyric Acid 0.75 High  mmol/L     CXR: CHEST - 2 VIEW 02/11/2021 FINDINGS: The heart size and mediastinal contours are within normal limits. Both lungs are clear. The visualized skeletal structures are unremarkable. IMPRESSION: No acute abnormality of the lungs. By: Eddie Candle M.D.  Discharge Instructions: Discharge Instructions     Call MD for:  difficulty breathing, headache or visual disturbances   Complete by: As directed    Call MD for:  persistant dizziness or light-headedness   Complete by: As directed    Call MD for:  persistant nausea and vomiting   Complete by: As directed    Diet - low sodium heart healthy   Complete by: As directed    Increase activity slowly   Complete by: As directed        Signed: Wayland Denis, MD 02/15/21, 1:48 PM  Pager: [phone] Internal Medicine Resident, PGY-1 [facility]

## 2021-02-11 NOTE — Progress Notes (Signed)
CSW received consult regarding patient being discharged on insulin. CSW spoke with Chales Abrahams Placey at the [facility] She stated pens are the easiest for their population to use since patients can carry it with them. They have lockers at the [facility] if patient need to store medications there. MD can have pens and meter filled at [facility] pharmacy if patient is discharged prior to the weekend.   Joaquin Courts, MSW, [facility]

## 2021-02-11 NOTE — Progress Notes (Signed)
Initial Nutrition Assessment  DOCUMENTATION CODES:  Severe malnutrition in context of chronic illness, Underweight  INTERVENTION:  Add Glucerna Shake po TID, each supplement provides 220 kcal and 10 grams of protein.  Add Magic cup TID with meals, each supplement provides 290 kcal and 9 grams of protein.  Add MVI with minerals daily.  NUTRITION DIAGNOSIS:  Severe Malnutrition related to chronic illness (uncontrolled T2DM, polysubstance abuse, HIV/AIDS) as evidenced by severe fat depletion, severe muscle depletion.  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  Spoke with pt at bedside while getting blood drawn for labs. He reports that he has been eating poorly and not focusing on his health for the past 2 years or so while he has focused more on abusing drugs. He reports that he is done with that lifestyle now and wants to get some serious help.  Per Epic, pt has lost ~22 lbs (14.5%) in the past year. This is not necessarily significant for the time frame, but it is concerning. Pt endorses this. He reports his UBW is around 154 lbs.  On exam, pt severely depleted in almost every area.  Given above information, pt is severely malnourished in the chronic setting.  Recommend starting Glucerna TID (per pt request) and Magic Cup TID to promote intake for increased needs to replete loses. Also recommend MVI with minerals daily.  Medications: reviewed; Tivicay, Descovy, SSI, Lantus  Labs: reviewed; Na 129 (L), CBG 142-245 (H) HbA1c: 13.2% (01/2021)  NUTRITION - FOCUSED PHYSICAL EXAM: Flowsheet Row Most  Recent Value  Orbital Region Severe depletion  Upper Arm Region Mild depletion  Thoracic and Lumbar Region Severe depletion  Buccal Region Severe depletion  Temple Region Severe depletion  Clavicle Bone Region Severe depletion  Clavicle and Acromion Bone Region Severe depletion  Scapular Bone Region Severe depletion  Dorsal Hand Moderate depletion  Patellar Region Severe depletion  Anterior Thigh Region Severe depletion  Posterior Calf Region Severe depletion  Edema (RD Assessment) None  Hair Reviewed  Eyes Reviewed  Mouth Reviewed  Skin Reviewed  Nails Reviewed   Diet Order:   Diet Order             Diet Carb Modified Fluid consistency: Thin; Room service appropriate? Yes  Diet effective now                  EDUCATION NEEDS:  Education needs have been addressed  Skin:  Skin Assessment: Reviewed RN Assessment  Last BM:  PTA/unknown  Height:  Ht Readings from Last 1 Encounters:  02/10/21 6\' 3"  (1.905 m)   Weight:  Wt Readings from Last 1 Encounters:  02/10/21 58 kg   BMI:  Body mass index is 15.98 kg/m.  Estimated Nutritional Needs:  Kcal:  2300-2500 Protein:  95-110 grams Fluid:  >2 L  02/12/21, RD, LDN (she/her/hers) Registered Dietitian I After-Hours/Weekend Pager # [phone]

## 2021-02-12 ENCOUNTER — Other Ambulatory Visit (HOSPITAL_COMMUNITY): Payer: Self-pay

## 2021-02-12 DIAGNOSIS — E1110 Type 2 diabetes mellitus with ketoacidosis without coma: Secondary | ICD-10-CM | POA: Diagnosis not present

## 2021-02-12 DIAGNOSIS — B20 Human immunodeficiency virus [HIV] disease: Secondary | ICD-10-CM | POA: Diagnosis not present

## 2021-02-12 DIAGNOSIS — B370 Candidal stomatitis: Secondary | ICD-10-CM | POA: Diagnosis not present

## 2021-02-12 LAB — CBC WITH DIFFERENTIAL/PLATELET
Abs Immature Granulocytes: 0.02 10*3/uL (ref 0.00–0.07)
Basophils Absolute: 0 10*3/uL (ref 0.0–0.1)
Basophils Relative: 1 %
Eosinophils Absolute: 0 10*3/uL (ref 0.0–0.5)
Eosinophils Relative: 0 %
HCT: 31.8 % — ABNORMAL LOW (ref 39.0–52.0)
Hemoglobin: 11.4 g/dL — ABNORMAL LOW (ref 13.0–17.0)
Immature Granulocytes: 1 %
Lymphocytes Relative: 45 %
Lymphs Abs: 1.8 10*3/uL (ref 0.7–4.0)
MCH: 30.9 pg (ref 26.0–34.0)
MCHC: 35.8 g/dL (ref 30.0–36.0)
MCV: 86.2 fL (ref 80.0–100.0)
Monocytes Absolute: 0.3 10*3/uL (ref 0.1–1.0)
Monocytes Relative: 6 %
Neutro Abs: 1.9 10*3/uL (ref 1.7–7.7)
Neutrophils Relative %: 47 %
Platelets: 300 10*3/uL (ref 150–400)
RBC: 3.69 MIL/uL — ABNORMAL LOW (ref 4.22–5.81)
RDW: 12.8 % (ref 11.5–15.5)
WBC: 4 10*3/uL (ref 4.0–10.5)
nRBC: 0 % (ref 0.0–0.2)

## 2021-02-12 LAB — BASIC METABOLIC PANEL
Anion gap: 6 (ref 5–15)
BUN: 7 mg/dL (ref 6–20)
CO2: 23 mmol/L (ref 22–32)
Calcium: 8.8 mg/dL — ABNORMAL LOW (ref 8.9–10.3)
Chloride: 102 mmol/L (ref 98–111)
Creatinine, Ser: 0.73 mg/dL (ref 0.61–1.24)
GFR, Estimated: >60 mL/min (ref >60)
Glucose, Bld: 297 mg/dL — ABNORMAL HIGH (ref 70–99)
Potassium: 3.9 mmol/L (ref 3.5–5.1)
Sodium: 131 mmol/L — ABNORMAL LOW (ref 135–145)

## 2021-02-12 LAB — GLUCOSE, CAPILLARY
Glucose-Capillary: 517 mg/dL (ref 70–99)
Glucose-Capillary: 244 mg/dL — ABNORMAL HIGH (ref 70–99)
Glucose-Capillary: 164 mg/dL — ABNORMAL HIGH (ref 70–99)
Glucose-Capillary: 170 mg/dL — ABNORMAL HIGH (ref 70–99)

## 2021-02-12 MED ORDER — INSULIN ASPART 100 UNIT/ML IJ SOLN
8.0000 [IU] | Freq: Once | INTRAMUSCULAR | Status: AC
  Administered 2021-02-12: 8 [IU] via SUBCUTANEOUS

## 2021-02-12 MED ORDER — INSULIN ASPART 100 UNIT/ML IJ SOLN
5.0000 [IU] | Freq: Three times a day (TID) | INTRAMUSCULAR | Status: DC
  Administered 2021-02-12 – 2021-02-14 (×6): 5 [IU] via SUBCUTANEOUS

## 2021-02-12 MED ORDER — ACCU-CHEK GUIDE VI STRP
ORAL_STRIP | 12 refills | Status: DC
Start: 2021-02-12 — End: 2021-12-17
  Filled 2021-02-12: qty 100, 25d supply, fill #0

## 2021-02-12 MED ORDER — INSULIN GLARGINE 100 UNIT/ML SOLOSTAR PEN
30.0000 [IU] | PEN_INJECTOR | Freq: Every day | SUBCUTANEOUS | 11 refills | Status: DC
  Filled 2021-02-12: qty 9, 30d supply, fill #0

## 2021-02-12 MED ORDER — METFORMIN HCL 500 MG PO TABS: 1000.0000 mg | ORAL_TABLET | Freq: Two times a day (BID) | ORAL | 3 refills | Status: DC

## 2021-02-12 MED ORDER — ZOLPIDEM TARTRATE 5 MG PO TABS
5.0000 mg | ORAL_TABLET | Freq: Every evening | ORAL | Status: DC | PRN
  Administered 2021-02-12 – 2021-02-15 (×4): 5 mg via ORAL
  Filled 2021-02-12 (×4): qty 1

## 2021-02-12 MED ORDER — INSULIN GLARGINE 100 UNIT/ML ~~LOC~~ SOLN
15.0000 [IU] | Freq: Two times a day (BID) | SUBCUTANEOUS | Status: DC
  Administered 2021-02-12 – 2021-02-13 (×2): 15 [IU] via SUBCUTANEOUS
  Filled 2021-02-12 (×3): qty 0.15

## 2021-02-12 MED ORDER — METFORMIN HCL 500 MG PO TABS: 1000.0000 mg | ORAL_TABLET | Freq: Two times a day (BID) | ORAL | 0 refills | Status: DC

## 2021-02-12 MED ORDER — ACCU-CHEK GUIDE W/DEVICE KIT
PACK | 0 refills | Status: DC
  Filled 2021-02-12: qty 1, 30d supply, fill #0

## 2021-02-12 MED ORDER — ACCU-CHEK SOFTCLIX LANCETS MISC
5 refills | Status: DC
  Filled 2021-02-12: qty 100, 25d supply, fill #0

## 2021-02-12 MED ORDER — BLOOD GLUCOSE MONITOR KIT
PACK | 0 refills | Status: DC
  Filled 2021-02-12: qty 1, fill #0

## 2021-02-12 MED ORDER — PENTIPS 32G X 4 MM MISC
5 refills | Status: DC
Start: 2021-02-12 — End: 2021-12-17
  Filled 2021-02-12: qty 100, 30d supply, fill #0

## 2021-02-12 MED ORDER — METFORMIN HCL 500 MG PO TABS
1000.0000 mg | ORAL_TABLET | Freq: Two times a day (BID) | ORAL | 0 refills | Status: DC
  Filled 2021-02-12: qty 120, 30d supply, fill #0

## 2021-02-12 MED ORDER — METFORMIN HCL 500 MG PO TABS
500.0000 mg | ORAL_TABLET | Freq: Two times a day (BID) | ORAL | Status: DC
  Administered 2021-02-12 – 2021-02-16 (×9): 500 mg via ORAL
  Filled 2021-02-12 (×9): qty 1

## 2021-02-12 MED ORDER — INSULIN ASPART 100 UNIT/ML IJ SOLN: 20.0000 [IU] | Freq: Once | INTRAMUSCULAR | Status: DC

## 2021-02-12 NOTE — Progress Notes (Signed)
Inpatient Diabetes Program Recommendations  AACE/ADA: New Consensus Statement on Inpatient Glycemic Control (2015)  Target Ranges:  Prepandial:   less than 140 mg/dL      Peak postprandial:   less than 180 mg/dL (1-2 hours)      Critically ill patients:  140 - 180 mg/dL   Lab Results  Component Value Date   GLUCAP 517 (HH) 02/12/2021   HGBA1C 13.2 (H) 02/08/2021    Review of Glycemic Control Results for Derrick Cooper, Derrick Cooper (MRN [id number]) as of 02/12/2021 11:50  Ref. Range 02/11/2021 12:15 02/11/2021 16:28 02/11/2021 20:21 02/12/2021 09:35  Glucose-Capillary Latest Ref Range: 70 - 99 mg/dL 917 (H) 915 (H) 056 (H) 517 (HH)  Diabetes history: Type 2 DM Outpatient Diabetes medications: Metformin 500 mg BID Current orders for Inpatient glycemic control: Lantus 15 units QHS, Novolog 0-15 units TID, Novolog 20 units x1, Novolog 5 units TID   Inpatient Diabetes Program Recommendations:     Consider: - increasing Lantus to 15 units BID -decreasing one time order to Novolog 8 units  Of note, verified with RN that patient had received Nepro (38 g of CHO) in addition to ice cream and meal tray (~80 g CHO). Secure chat sent to RN to anticipate new insulin orders. Discussed with resident. Per patient has consistent access to meals for breakfast and dinner. Orders placed.   Patient will need a meter at discharge. Blood glucose meter (#[id number]).  With drug use and homelessness, would not recommend insulin syringes.   Thanks, Lujean Rave, MSN, RNC-OB Diabetes Coordinator [phone] (8a-5p)

## 2021-02-12 NOTE — Plan of Care (Signed)
  Problem: Education: Goal: Knowledge of General Education information will improve Description Including pain rating scale, medication(s)/side effects and non-pharmacologic comfort measures Outcome: Progressing   Problem: Health Behavior/Discharge Planning: Goal: Ability to manage health-related needs will improve Outcome: Progressing   

## 2021-02-12 NOTE — Progress Notes (Addendum)
Subjective: Patient reports tolerating PO. Able to ambulate around the room. Will try walking around unit today. No nausea/vomiting. No dizziness. Still feels some generalized weakness. Reports poor sleep, would like different sleep aid.  Objective:  Vital signs in last 24 hours: Vitals:   02/12/21 0128 02/12/21 0543 02/12/21 0544 02/12/21 0900  BP: 93/67 94/75 107/82 104/76  Pulse: 100 94 90 92  Resp: 20 (!) 22 (!) 22 20  Temp: 98.2 F (36.8 C) 98.3 F (36.8 C) 98.3 F (36.8 C) 97.9 F (36.6 C)  TempSrc: Oral Oral Oral Oral  SpO2: 99% 98% 99% 100%  Weight:      Height:        Intake/Output Summary (Last 24 hours) at 02/12/2021 1256 Last data filed at 02/12/2021 0537 Gross per 24 hour  Intake 600 ml  Output 2000 ml  Net -1400 ml   Physical Exam: Constitutional: chronically ill african [place] male, in no acute distress, malnourished HENT: normocephalic atraumatic, mucous membranes moist, tongue with poor oral hygiene/white plaques non erythematous Eyes: conjunctiva non-erythematous, eye patch in place L eye Cardiovascular: regular rate and rhythm, no m/r/g Pulmonary/Chest: normal work of breathing on room air, lungs clear to auscultation bilaterally, occasional rhonchi Abdominal: soft, non-tender, non-distended MSK: reduced muscle bulk and normal tone Neurological: alert & oriented x 3, anti-gravity strength in bilateral upper and lower extremities, normal gait Skin: warm and dry, no rash/lesions on visible surfaces Psych: appropriate affect, linear though process, normal speech, no VHA  CBC Latest Ref Rng & Units 02/12/2021 02/11/2021 02/10/2021  WBC 4.0 - 10.5 K/uL 4.0 4.9 -  Hemoglobin 13.0 - 17.0 g/dL 11.4(L) 11.7(L) 12.9(L)  Hematocrit 39.0 - 52.0 % 31.8(L) 33.2(L) 38.0(L)  Platelets 150 - 400 K/uL 300 319 -   CMP Latest Ref Rng & Units 02/12/2021 02/11/2021 02/11/2021  Glucose 70 - 99 mg/dL 195(K) 932(I) 712(W)  BUN 6 - 20 mg/dL 7 9 9   Creatinine 0.61 - 1.24 mg/dL  5.80 9.98  Sodium 135 - 145 mmol/L 131(L) 131(L) 129(L)  Potassium 3.5 - 5.1 mmol/L 3.9 3.9 3.5  Chloride 98 - 111 mmol/L 102 100 101  CO2 22 - 32 mmol/L 23 23 21(L)  Calcium 8.9 - 10.3 mg/dL 3.38) 2.5(K) 5.3(Z)  Total Protein 6.5 - 8.1 g/dL - - -  Total Bilirubin 0.3 - 1.2 mg/dL - - -  Alkaline Phos 38 - 126 U/L - - -  AST 15 - 41 U/L - - -  ALT 0 - 44 U/L - - -   Urinalysis, Routine w reflex microscopic Urine, Clean Catch 7.6(B (Abnormal) Collected: 02/10/21 1200  Specimen: Urine, Clean Catch Updated: 02/10/21 1333   Color, Urine STRAW Abnormal    APPearance CLEAR   Specific Gravity, Urine 1.023   pH 6.0   Glucose, UA >=500 Abnormal  mg/dL    Hgb urine dipstick NEGATIVE   Bilirubin Urine NEGATIVE   Ketones, ur 80 Abnormal  mg/dL    Protein, ur NEGATIVE mg/dL    Nitrite NEGATIVE   Leukocytes,Ua NEGATIVE   RBC / HPF 0-5 RBC/hpf    WBC, UA 0-5 WBC/hpf    Bacteria, UA RARE Abnormal    Mucus PRESENT   I-Stat Venous Blood Gas, ED 02/12/21 (Abnormal) Collected: 02/10/21 1447  Specimen: Blood Updated: 02/10/21 1450   pH, Ven 7.293   pCO2, Ven 44.5 mmHg    pO2, Ven 139.0 High  mmHg    Bicarbonate 21.5 mmol/L    TCO2 23 mmol/L  O2 Saturation 99.0 %    Acid-base deficit 5.0 High  mmol/L    Sodium 134 Low  mmol/L    Potassium 4.4 mmol/L    Calcium, Ion 1.16 mmol/L    HCT 38.0 Low  %    Hemoglobin 12.9 Low  g/dL    Sample type VENOUS    DG Chest 2 View  Result Date: 02/11/2021 CLINICAL DATA:  Hyperglycemia, weakness, dizziness EXAM: CHEST - 2 VIEW COMPARISON:  10/23/2020 FINDINGS: The heart size and mediastinal contours are within normal limits. Both lungs are clear. The visualized skeletal structures are unremarkable. IMPRESSION: No acute abnormality of the lungs. Electronically Signed   By: Lauralyn Primes M.D.   On: 02/11/2021 10:33      Assessment/Plan:  Active Problems:   HIV disease (HCC)   Diabetic ketoacidosis (HCC)   Thrush  Derrick Cooper is a  60 y.o. 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  #Diabetic ketoacidosis #Poorly controlled T2DM Patient has history of T2DM, previously on insulin though currently on Metformin 500mg  BID. He endorses adherence to Metformin. Patient [facility] ED with generalized weakness, polydipsia, and polyuria. Reports drinking many full sugar sodas. Hgb A1c 7/11 was 13.2%. Blood glucose 350 on arrival with anion gap metabolic acidosis with bicarb 18 and anion gap 16, VBG pH 7.29, UA with ketones. His blood glucose has improved with fluids and insulin gtt, anion gap closed, transitioned to SQ insulin. Beta hydroxybutyric acid 5.23 on admission is down trending. Patient has had improvement of generalized weakness symptoms. Patient eating and ambulating. Patient had elevated BG 200-300 and further elevated to 517 on 7/15. Patient reportedly having carbohydrate rich snacks and mistakenly given nepro drink by house staff. Given single dose of Novolog 8 units. Will need to continue to manage hyperglycemia and develop plan for discharge diabetes medication regimen.  -Lantus increased to 15u BID 7/15 -Novolog 5u with meals -SSI -Appreciate diabetes coordinator recommendations -Patient will need glucose meter at discharge -[facility] consult placed  #HIV AIDS Patient has history of AIDS. He saw infectious disease ([facility]. [facility]) on Monday 7/11 for HIV. He has been off his HIV medication for several months 2/2 substance use relapse (alcohol and crack cocaine). He was started on Tivicay and Descovy as well as Bactrim for OI prophylaxis. ID identified no sign of oppurtunistic infections at that time. Patient seems motivated to maintain sobriety and adhere to HIV medications. Last CD4 7/11 175, CD4 count  less than 200 meets criteria for AIDS. RPR non reactive 7/11. HIV RNA PCR ordered by ID at clinic visit still pending.  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 Patient afebrile without leukocytosis. We ordered infectious workup. UA with rare bacteria, blood cultures pending, urine culture pending, CXR clear without any acute abnormalities.  -blood cultures NGTD -f/u urine culture  -continue to monitor vitals and for signs of infection, the hallmark of the syndrome is paradoxical worsening of an existing infection or disease process or appearance of a new infection/disease process  soon after initiation of ART.   #Thrush Patient with white plaques on tongue on exam consistent with thrush. Treated with oral fluconazole given immunocompromised state.  -fluconazole 200mg  qd -nystatin mouthwash  #Severe Malnutrition 2/2 chronic illnesses Patient with BMI 15.98 with severe fat depletion, severe muscle depletion. Pt has lost ~22 lbs (14.5%) in the past year. Not necessarily significant for the time frame, but it is concerning.  -Dietitian gave recommendations, appreciate recs -glucerna nutritional supps  #Polysubstance abuse Patient reports previous heavy alcohol and crack cocaine use. Patient states last alcohol and cocaine use was one month ago. He had Dts for two days after quitting. He continues to smoke cigarettes 1/2 PPD. -Nicotine patch  #HLD Patient has history of HLD. He was restarted on home atorvastatin 40mg . Patient has lipid panel pending from recent ID clinic visit.  -atorvastatin 40mg   #Hx of Psychosis #Insomnia Patient has history of  reported schizophrenia. However on our examination patient with linear thought process, without hallucinations or paranoia. Given history of crack cocaine use, patient likely had component of drug induced psychosis. Patient was prescribed Seroquel though does not take this due to side effect of sedation. Since patient endorses sobriety and without signs of psychosis on psych exam, we are holding antipsychotics. -PRN Ambien for sleep   Prior to Admission Living Arrangement: unhomed Anticipated Discharge Location:  Barriers to Discharge: infectious workup pending, continuing to manage hyperglycemia Dispo: Anticipated discharge in approximately 1 day(s).   , MD 02/12/21, 12:56 PM  Pager: [phone] Internal Medicine Resident, PGY-1 [facility]
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
## 2021-02-12 NOTE — TOC Transition Note (Signed)
Transition of Care [facility]) - CM/SW Discharge Note   Patient Details  Name: Derrick Cooper MRN: [id number] Date of Birth: May 24, 1961  [facility] CM/SW Contact:  [name], LCSW Phone Number: 02/12/2021, 11:26 AM   Clinical Narrative:    CSW spoke with patient and provided community resources. He stated he is done with substance use as he does not want to die young. He reported he has been going to the [facility] and [employer]. [facility] has a phone and showers he can use as well as the congregational nursing program that has been monitoring his blood sugars. He stated that the [facility] is closed today due to a business event but they will be open tomorrow until 12pm so that he can pick up his sleeping bag and things. He is requesting discharge in the morning; CSW made MD aware. CSW provided bus pass. MD to send scripts to [facility] pharmacy today to fill for discharge since they are closed on the weekend.    Final next level of care: Home/Self Care Barriers to Discharge: No Barriers Identified   Patient Goals and CMS Choice        Discharge Placement                       Discharge Plan and Services In-house Referral: Clinical Social Work                                   Social Determinants of Health (SDOH) Interventions     Readmission Risk Interventions Readmission Risk Prevention Plan 01/03/2020  Transportation Screening Complete  Medication Review Oceanographer) Complete  PCP or Specialist appointment within 3-5 days of discharge Complete  HRI or Home Care Consult Complete  SW Recovery Care/Counseling Consult Complete  Palliative Care Screening Not Applicable  Skilled Nursing Facility Not Applicable  Some recent data might be hidden

## 2021-02-13 DIAGNOSIS — E785 Hyperlipidemia, unspecified: Secondary | ICD-10-CM | POA: Diagnosis not present

## 2021-02-13 DIAGNOSIS — G4700 Insomnia, unspecified: Secondary | ICD-10-CM

## 2021-02-13 DIAGNOSIS — E43 Unspecified severe protein-calorie malnutrition: Secondary | ICD-10-CM | POA: Insufficient documentation

## 2021-02-13 DIAGNOSIS — E119 Type 2 diabetes mellitus without complications: Secondary | ICD-10-CM

## 2021-02-13 DIAGNOSIS — B20 Human immunodeficiency virus [HIV] disease: Secondary | ICD-10-CM | POA: Diagnosis not present

## 2021-02-13 LAB — URINE CULTURE

## 2021-02-13 LAB — CBC
HCT: 32.3 % — ABNORMAL LOW (ref 39.0–52.0)
Hemoglobin: 11.4 g/dL — ABNORMAL LOW (ref 13.0–17.0)
MCH: 30.6 pg (ref 26.0–34.0)
MCHC: 35.3 g/dL (ref 30.0–36.0)
MCV: 86.6 fL (ref 80.0–100.0)
Platelets: 307 K/uL (ref 150–400)
RBC: 3.73 MIL/uL — ABNORMAL LOW (ref 4.22–5.81)
RDW: 13 % (ref 11.5–15.5)
WBC: 5.3 K/uL (ref 4.0–10.5)
nRBC: 0 % (ref 0.0–0.2)

## 2021-02-13 LAB — COMPREHENSIVE METABOLIC PANEL WITH GFR
ALT: 11 U/L (ref 0–44)
AST: 13 U/L — ABNORMAL LOW (ref 15–41)
Albumin: 2.3 g/dL — ABNORMAL LOW (ref 3.5–5.0)
Alkaline Phosphatase: 55 U/L (ref 38–126)
Anion gap: 6 (ref 5–15)
BUN: 12 mg/dL (ref 6–20)
CO2: 23 mmol/L (ref 22–32)
Calcium: 9 mg/dL (ref 8.9–10.3)
Chloride: 100 mmol/L (ref 98–111)
Creatinine, Ser: 0.77 mg/dL (ref 0.61–1.24)
GFR, Estimated: >60 mL/min
Glucose, Bld: 328 mg/dL — ABNORMAL HIGH (ref 70–99)
Potassium: 4.3 mmol/L (ref 3.5–5.1)
Sodium: 129 mmol/L — ABNORMAL LOW (ref 135–145)
Total Bilirubin: 0.5 mg/dL (ref 0.3–1.2)
Total Protein: 7.9 g/dL (ref 6.5–8.1)

## 2021-02-13 LAB — GLUCOSE, CAPILLARY
Glucose-Capillary: 399 mg/dL — ABNORMAL HIGH (ref 70–99)
Glucose-Capillary: 413 mg/dL — ABNORMAL HIGH (ref 70–99)
Glucose-Capillary: 72 mg/dL (ref 70–99)
Glucose-Capillary: 99 mg/dL (ref 70–99)
Glucose-Capillary: 312 mg/dL — ABNORMAL HIGH (ref 70–99)

## 2021-02-13 MED ORDER — INSULIN GLARGINE 100 UNIT/ML ~~LOC~~ SOLN
22.0000 [IU] | Freq: Every day | SUBCUTANEOUS | Status: DC
  Administered 2021-02-13: 22 [IU] via SUBCUTANEOUS
  Filled 2021-02-13 (×2): qty 0.22

## 2021-02-13 MED ORDER — INSULIN GLARGINE 100 UNIT/ML ~~LOC~~ SOLN
15.0000 [IU] | Freq: Every day | SUBCUTANEOUS | Status: DC
  Administered 2021-02-14: 15 [IU] via SUBCUTANEOUS
  Filled 2021-02-13: qty 0.15

## 2021-02-13 NOTE — Progress Notes (Signed)
Physical Therapy Treatment Patient Details Name: Derrick Cooper MRN: [id number] DOB: 1961/06/21 Today's Date: 02/13/2021    History of Present Illness 60 y.o. male presents to [facility] ED on 02/10/2021 with complaints of weakness and dizziness. Blood glucose found to be 480 in ED. 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    PT Comments    Patient progressing towards physical therapy goals. Patient at supervision level for ambulation due to mild balance deficits and drifting L/R throughout. Encouraged mobility in hallway with nursing staff. Patient declined further mobility and exercises upon return to room. D/c plan continues to remain appropriate.    Follow Up Recommendations  No PT follow up     Equipment Recommendations  None recommended by PT    Recommendations for Other Services       Precautions / Restrictions Precautions Precautions: Fall Restrictions Weight Bearing Restrictions: No    Mobility  Bed Mobility Overal bed mobility: Independent                  Transfers Overall transfer level: Independent Equipment used: None                Ambulation/Gait Ambulation/Gait assistance: Supervision Gait Distance (Feet): 250 Feet Assistive device: None Gait Pattern/deviations: Step-through pattern;Drifts right/left     General Gait Details: drifting L/R throughout. Supervision for safety. No overt LOB noted throughout   Stairs             Wheelchair Mobility    Modified Rankin (Stroke Patients Only)       Balance Overall balance assessment: Mild deficits observed, not formally tested                                          Cognition Arousal/Alertness: Awake/alert Behavior During Therapy: WFL for tasks assessed/performed Overall Cognitive Status: Within Functional Limits for tasks assessed                                         Exercises      General Comments        Pertinent Vitals/Pain Pain Assessment: No/denies pain    Home Living                      Prior Function            PT Goals (current goals can now be found in the care plan section) Acute Rehab PT Goals Patient Stated Goal: to return to independence PT Goal Formulation: With patient Time For Goal Achievement: 02/25/21 Potential to Achieve Goals: Good Progress towards PT goals: Progressing toward goals    Frequency    Min 3X/week      PT Plan Current plan remains appropriate    Co-evaluation              AM-PAC PT "6 Clicks" Mobility   Outcome Measure  Help needed turning from your back to your side while in a flat bed without using bedrails?: None Help needed moving from lying on your back to sitting on the side of a flat bed without using bedrails?: None Help needed moving to and from a bed to a chair (including a wheelchair)?: None Help needed  standing up from a chair using your arms (e.g., wheelchair or bedside chair)?: None Help needed to walk in hospital room?: A Little Help needed climbing 3-5 steps with a railing? : A Little 6 Click Score: 22    End of Session   Activity Tolerance: Patient tolerated treatment well Patient left: in bed;with call bell/phone within reach;with bed alarm set Nurse Communication: Mobility status PT Visit Diagnosis: Unsteadiness on feet (R26.81);Other abnormalities of gait and mobility (R26.89);Muscle weakness (generalized) (M62.81)     Time: 1101-1120 PT Time Calculation (min) (ACUTE ONLY): 19 min  Charges:  $Gait Training: 8-22 mins                     Jackey Housey A. Dan Humphreys PT, DPT Acute Rehabilitation Services Pager [phone] Office [phone]    Viviann Spare 02/13/2021, 11:26 AM

## 2021-02-13 NOTE — Plan of Care (Signed)

## 2021-02-13 NOTE — Progress Notes (Signed)
Subjective: No overnight events.  Patient denies any complaints this morning denies any nausea, vomiting or abdominal pain  Objective:  Vital signs in last 24 hours: Vitals:   02/12/21 1955 02/13/21 0000 02/13/21 0400 02/13/21 1140  BP: 120/86 107/73 100/69 93/64  Pulse: 95 95 91 98  Resp: 14 20 20 20   Temp: 98 F (36.7 C) 98.1 F (36.7 C) 98 F (36.7 C) (!) 97.5 F (36.4 C)  TempSrc: Axillary Oral Oral Oral  SpO2: 99% 95% 95% 98%  Weight:      Height:       Physical Exam General: alert, appears stated age, in no acute distress HEENT: Normocephalic, atraumatic, EOM intact, conjunctiva normal CV: Regular rate and rhythm, no murmurs rubs or gallops Pulm: Clear to auscultation bilaterally, normal work of breathing Abdomen: Soft, nondistended, bowel sounds present, no tenderness to palpation MSK: No lower extremity edema Skin: Warm and dry Neuro: Alert and oriented x3   Assessment/Plan:  Active Problems:   HIV disease (HCC)   Diabetic ketoacidosis (HCC)   Thrush   Protein-calorie malnutrition, severe  Derrick Cooper is a 60 y.o. 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   Diabetic ketoacidosis, resolved  Poorly controlled T2DM Presented with DKA which has now resolved.  He was previously on insulin however currently only taking metformin 500 mg twice daily.  Blood glucose overnight was in the 160s to 170s however this morning 399.  Patient denies any abdominal pain nausea or vomiting.  Will titrate his insulin regimen.  Once his blood glucose has stabilized/improved he will be stable for discharge. -Continue Lantus 15 units daily  -Increase nighttime Lantus to to 22 units  -Continue NovoLog 5 units with meals -Resistant SSI -Patient will  need glucose meter at discharge -TOC consult placed   HIV/AIDS Last CD4 7/11 175, CD4 count less than 200 meets criteria for AIDS. RPR non reactive 7/11. HIV RNA PCR ordered by ID at clinic visit still pending. 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 Patient afebrile without leukocytosis. We ordered infectious workup. UA with rare bacteria, blood cultures pending, urine culture pending, CXR clear without any acute abnormalities. -blood cultures NGTD -f/u urine culture -continue to monitor vitals and for signs of infection, the hallmark of the syndrome is paradoxical worsening of an existing infection or disease process or appearance of a new infection/disease process soon after initiation of ART.   Thrush -fluconazole 200mg  qd -nystatin mouthwash   Severe Malnutrition 2/2 chronic illnesses Patient with BMI 15.98 with severe fat depletion, severe muscle depletion. Pt has lost ~22 lbs (14.5%) in the past year. Not necessarily significant for the time frame, but it is concerning. -Dietitian gave recommendations, appreciate recs -glucerna nutritional supps   Polysubstance abuse Patient reports previous heavy alcohol and crack cocaine use. Patient states last alcohol and cocaine use was one month ago. He had Dts for two days after quitting. He continues to smoke cigarettes 1/2 PPD. -Nicotine patch   HLD -atorvastatin 40mg   Hx of Psychosis Insomnia Patient has history of reported schizophrenia. However on our examination patient with linear thought process, without hallucinations or paranoia. Given history of crack cocaine use, patient likely had  component of drug induced psychosis. Patient was prescribed Seroquel though does not take this due to side effect of sedation. Since patient endorses sobriety and without signs of psychosis on psych exam, we are holding antipsychotics. -PRN Ambien for sleep  Prior to Admission Living Arrangement: Unhomed Anticipated Discharge Location: Unsure Barriers to Discharge: Uncontrolled diabetes Dispo: Anticipated discharge in approximately 1 day(s).   Derrick Cooper N, DO 02/13/2021, 2:37 PM Pager: [phone] After 5pm on weekdays and 1pm on weekends: On Call pager [phone]

## 2021-02-13 NOTE — Progress Notes (Signed)
Patient's BG 418. MD notified . Gave 20 units plus 5 units standing order as prescribed. Will continue to monitor

## 2021-02-13 NOTE — Progress Notes (Signed)
Patient eating breakfast. Patient medicated at bedside. Blood Glucose treated at this time. Educated the patient on carb's in his cereal and the natural sugars in his banana can contribute to his increased sugar levels.Patient stated understanding. Talked with patient at bedside about his path to the hospital and patient stated his is better no. No complaints or concerns stated. Personal items, phone, and table within reach. Bed lowered, 3 sides up. And in the locked position.

## 2021-02-14 LAB — GLUCOSE, CAPILLARY
Glucose-Capillary: 423 mg/dL — ABNORMAL HIGH (ref 70–99)
Glucose-Capillary: 164 mg/dL — ABNORMAL HIGH (ref 70–99)
Glucose-Capillary: 227 mg/dL — ABNORMAL HIGH (ref 70–99)
Glucose-Capillary: 249 mg/dL — ABNORMAL HIGH (ref 70–99)

## 2021-02-14 MED ORDER — INSULIN ASPART 100 UNIT/ML IJ SOLN
20.0000 [IU] | Freq: Three times a day (TID) | INTRAMUSCULAR | Status: DC
  Administered 2021-02-15 – 2021-02-16 (×3): 20 [IU] via SUBCUTANEOUS

## 2021-02-14 MED ORDER — INSULIN GLARGINE 100 UNIT/ML ~~LOC~~ SOLN
30.0000 [IU] | Freq: Every day | SUBCUTANEOUS | Status: DC
  Administered 2021-02-14: 30 [IU] via SUBCUTANEOUS
  Filled 2021-02-14 (×2): qty 0.3

## 2021-02-14 MED ORDER — INSULIN ASPART 100 UNIT/ML IJ SOLN
15.0000 [IU] | Freq: Three times a day (TID) | INTRAMUSCULAR | Status: DC
  Administered 2021-02-14: 15 [IU] via SUBCUTANEOUS

## 2021-02-14 NOTE — Progress Notes (Signed)
Ok to repeat hepatitis C VL to confirm cured while here per Dr. Karilyn Cota.  Ulyses Southward, PharmD, BCIDP, AAHIVP, CPP Infectious Disease Pharmacist 02/14/2021 8:47 AM

## 2021-02-14 NOTE — Progress Notes (Signed)
Pt CBG after eating breakfast this am = 423. Rounding MD/resident present in room at the time CBG taken and aware, pt to get sliding scale novolog insulin of 20 units and meal coverage of 5 units novolog insulin. MD aware and agrees on amount.

## 2021-02-14 NOTE — Progress Notes (Signed)
Occupational Therapy Treatment Patient Details Name: Derrick Cooper MRN: [id number] DOB: 1960/09/20 Today's Date: 02/14/2021    History of present illness 60 y.o. male presents to [facility] ED on 02/10/2021 with complaints of weakness and dizziness. Blood glucose found to be 480 in ED. 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   OT comments  Pt making steady progress towards OT goals this session. Pt with initial reports of dizziness when transitioning from supine>sitting and sit>stand but symptoms resolve with sitting rest break and light therex in sitting ( see vitals below). Pt completed functional mobility greater than a household distance with no AD and min guard assist, pt able to additionally engage in cognitive challenges during mobility with pt able to state months of year backwards and able to recall > 10 animals in one minute to assess attention to task and ability to multitask. Pt able to step over obstacles in hallway and navigate tight spaces with no AD or LOB. Pt would continue to benefit from skilled occupational therapy while admitted to address the below listed limitations in order to improve overall functional mobility and facilitate independence with BADL participation. DC plan remains appropriate, will follow acutely per POC.   supine BP 94/65 sitting BP after attempting to stand and reporting dizziness 93/63 BP after walking 107/71.    Follow Up Recommendations  No OT follow up    Equipment Recommendations  None recommended by OT    Recommendations for Other Services      Precautions / Restrictions Precautions Precautions: Fall Precaution Comments: watch BP Restrictions Weight Bearing Restrictions: No       Mobility Bed Mobility Overal bed mobility: Modified Independent             General bed mobility comments: use of bed features    Transfers Overall transfer  level: Needs assistance Equipment used: None Transfers: Sit to/from Stand Sit to Stand: Min guard         General transfer comment: min guard for safety d/t reports of dizziness    Balance Overall balance assessment: Needs assistance Sitting-balance support: No upper extremity supported;Feet supported Sitting balance-Leahy Scale: Good     Standing balance support: No upper extremity supported Standing balance-Leahy Scale: Good Standing balance comment: able to step over obstacles in hallway with no LOB, pt also able navigate tight spaces in hallway with no AD or LOB                           ADL either performed or assessed with clinical judgement   ADL Overall ADL's : Needs assistance/impaired                         Toilet Transfer: Min guard;Ambulation Toilet Transfer Details (indicate cue type and reason): simulated via functional mobility         Functional mobility during ADLs: Min guard General ADL Comments: pt continues to present with initial reports of dizziness with change in position but very motivated to work with therapies     Vision       Perception     Praxis      Cognition Arousal/Alertness: Awake/alert Behavior During Therapy: WFL for tasks assessed/performed Overall Cognitive Status: Within Functional Limits for tasks assessed  General Comments: pt able to state >10 animals in one minute, pt able to state months of year backwards during ambulation. pt with mild safety impairments noted by pts impulsivity but overall [facility]        Exercises General Exercises - Upper Extremity Shoulder Flexion: Strengthening;Both;10 reps;Seated General Exercises - Lower Extremity Ankle Circles/Pumps: Strengthening;Both;10 reps;Seated Long Arc Quad: Strengthening;Both;10 reps;Seated Hip Flexion/Marching: Strengthening;Both;20 reps;Seated   Shoulder Instructions       General Comments supine  BP 94/65, sitting BP after attempting to stand and reporting dizziness 93/63, BP after walking 107/71. all other VSS    Pertinent Vitals/ Pain       Pain Assessment: No/denies pain  Home Living                                          Prior Functioning/Environment              Frequency  Min 1X/week        Progress Toward Goals  OT Goals(current goals can now be found in the care plan section)  Progress towards OT goals: Progressing toward goals  Acute Rehab OT Goals Patient Stated Goal: to walk more OT Goal Formulation: With patient Time For Goal Achievement: 02/26/21 Potential to Achieve Goals: Good  Plan Discharge plan remains appropriate;Frequency remains appropriate    Co-evaluation                 AM-PAC OT "6 Clicks" Daily Activity     Outcome Measure   Help from another person eating meals?: None Help from another person taking care of personal grooming?: A Little Help from another person toileting, which includes using toliet, bedpan, or urinal?: A Little Help from another person bathing (including washing, rinsing, drying)?: A Little Help from another person to put on and taking off regular upper body clothing?: None Help from another person to put on and taking off regular lower body clothing?: A Little 6 Click Score: 20    End of Session Equipment Utilized During Treatment: Gait belt  OT Visit Diagnosis: Other abnormalities of gait and mobility (R26.89);Muscle weakness (generalized) (M62.81);Other symptoms and signs involving cognitive function   Activity Tolerance Patient tolerated treatment well   Patient Left in bed;with call bell/phone within reach;with bed alarm set   Nurse Communication Mobility status;Other (comment) (tolerance to session, BPs)        Time: 4585-9292 OT Time Calculation (min): 22 min  Charges: OT General Charges $OT Visit: 1 Visit OT Treatments $Therapeutic Activity: 8-22 mins  Lenor Derrick., COTA/L Acute Rehabilitation Services [phone]    Derrick Cooper 02/14/2021, 2:36 PM

## 2021-02-14 NOTE — Progress Notes (Addendum)
Subjective:  Overnight:No acute events or concerns overnight.  Patient reports tolerating PO. Able to ambulate around the room. Will try walking around unit today. No nausea/vomiting. No dizziness. Weakness has improved. We discussed his elevated blood glucose levels and patient was encouraged to watch snack and carb intake.  Objective:  Vital signs in last 24 hours: Vitals:   02/13/21 1950 02/14/21 0000 02/14/21 0408 02/14/21 0822  BP:  91/63 118/80 91/64  Pulse:  92 91 92  Resp:  20 20 19   Temp: 98.1 F (36.7 C) 97.9 F (36.6 C) 98.1 F (36.7 C) 97.8 F (36.6 C)  TempSrc:  Oral Oral Oral  SpO2:  98% 99% 98%  Weight:      Height:        Intake/Output Summary (Last 24 hours) at 02/14/2021 1216 Last data filed at 02/14/2021 0830 Gross per 24 hour  Intake --  Output 1225 ml  Net -1225 ml   Physical Exam: Constitutional: chronically ill african 02/16/2021 male, in no acute distress, malnourished HENT: normocephalic atraumatic, mucous membranes moist, tongue with poor oral hygiene/white plaques non erythematous Eyes: conjunctiva non-erythematous, eye patch in place L eye Cardiovascular: regular rate and rhythm, no m/r/g Pulmonary/Chest: normal work of breathing on room air, lungs clear to auscultation bilaterally, occasional rhonchi Abdominal: soft, non-tender, non-distended MSK: reduced muscle bulk and normal tone Neurological: alert & oriented x 3, anti-gravity strength in bilateral upper and lower extremities, normal gait Skin: warm and dry, no rash/lesions on visible surfaces Psych: appropriate affect, linear though process, normal speech, no VHA  CBC Latest Ref Rng & Units 02/13/2021 02/12/2021 02/11/2021  WBC 4.0 - 10.5 K/uL 5.3 4.0 4.9  Hemoglobin 13.0 - 17.0 g/dL 11.4(L) 11.4(L) 11.7(L)  Hematocrit 39.0 - 52.0 % 32.3(L) 31.8(L) 33.2(L)  Platelets 150 - 400 K/uL 307 300 319   CMP Latest Ref Rng & Units 02/13/2021 02/12/2021 02/11/2021  Glucose 70 - 99 mg/dL 02/13/2021)  371(G) 626(R)  BUN 6 - 20 mg/dL 12 7 9   Creatinine 0.61 - 1.24 mg/dL 485(I 6.27  Sodium 135 - 145 mmol/L 129(L) 131(L) 131(L)  Potassium 3.5 - 5.1 mmol/L 4.3 3.9 3.9  Chloride 98 - 111 mmol/L 100 102 100  CO2 22 - 32 mmol/L 23 23 23   Calcium 8.9 - 10.3 mg/dL 9.0 0.35) 0.09)  Total Protein 6.5 - 8.1 g/dL 7.9 - -  Total Bilirubin 0.3 - 1.2 mg/dL 0.5 - -  Alkaline Phos 38 - 126 U/L 55 - -  AST 15 - 41 U/L 13(L) - -  ALT 0 - 44 U/L 11 - -   Urinalysis, Routine w reflex microscopic Urine, Clean Catch (Abnormal) Collected: 02/10/21 1200  Specimen: Urine, Clean Catch Updated: 02/10/21 1333   Color, Urine STRAW Abnormal    APPearance CLEAR   Specific Gravity, Urine 1.023   pH 6.0   Glucose, UA >=500 Abnormal  mg/dL    Hgb urine dipstick NEGATIVE   Bilirubin Urine NEGATIVE   Ketones, ur 80 Abnormal  mg/dL    Protein, ur NEGATIVE mg/dL    Nitrite NEGATIVE   Leukocytes,Ua NEGATIVE   RBC / HPF 0-5 RBC/hpf    WBC, UA 0-5 WBC/hpf    Bacteria, UA RARE Abnormal    Mucus PRESENT   I-Stat Venous Blood Gas, ED [[id number]] (Abnormal) Collected: 02/10/21 1447  Specimen: Blood Updated: 02/10/21 1450   pH, Ven 7.293   pCO2, Ven 44.5 mmHg    pO2, Ven 139.0 High  mmHg  Bicarbonate 21.5 mmol/L    TCO2 23 mmol/L    O2 Saturation 99.0 %    Acid-base deficit 5.0 High  mmol/L    Sodium 134 Low  mmol/L    Potassium 4.4 mmol/L    Calcium, Ion 1.16 mmol/L    HCT 38.0 Low  %    Hemoglobin 12.9 Low  g/dL    Sample type VENOUS    DG Chest 2 View  Result Date: 02/11/2021 CLINICAL DATA:  Hyperglycemia, weakness, dizziness EXAM: CHEST - 2 VIEW COMPARISON:  10/23/2020 FINDINGS: The heart size and mediastinal contours are within normal limits. Both lungs are clear. The visualized skeletal structures are unremarkable. IMPRESSION: No acute abnormality of the lungs. Electronically Signed   By: Lauralyn Primes M.D.   On: 02/11/2021 10:33      Assessment/Plan:  Active Problems:   HIV  disease (HCC)   Diabetic ketoacidosis (HCC)   Thrush   Protein-calorie malnutrition, severe  Derrick Cooper is a 60 y.o. 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  #Diabetic ketoacidosis #Poorly controlled T2DM Patient has history of T2DM, previously on insulin though currently on Metformin 500mg  BID. He endorses adherence to Metformin. Patient presented [facility] ED with generalized weakness, polydipsia, and polyuria. Reports drinking many full sugar sodas. Hgb A1c 7/11 was 13.2%. Blood glucose 350 on arrival with anion gap metabolic acidosis with bicarb 18 and anion gap 16, VBG pH 7.29, UA with ketones. His blood glucose has improved with fluids and insulin gtt, anion gap closed, transitioned to SQ insulin. Beta hydroxybutyric acid 5.23 on admission is down trending. Patient has had improvement of generalized weakness symptoms. Patient eating and ambulating. Will need to continue to manage hyperglycemia and develop plan for discharge diabetes medication regimen.  -Lantus 15 AM  -Lantus 30 PM tonight -Novolog 15u with meals -Resistant SSI -Appreciate diabetes coordinator recommendations -Patient will need glucose meter at discharge -TOC consult placed  #HIV AIDS Patient has history of AIDS. He saw infectious disease (Dr. 9/11) on Monday 7/11 for HIV. He has been off his HIV medication for several months 2/2 substance use relapse (alcohol and crack cocaine). He was started on Tivicay and Descovy as well as Bactrim for OI prophylaxis. ID identified no sign of oppurtunistic infections at that time. Patient seems motivated to maintain sobriety and adhere to HIV medications. Last CD4 7/11 175, CD4 count less than 200 meets criteria for AIDS. RPR non reactive 7/11. HIV RNA PCR  quant 3,310.  -Tivicay 50mg  qd -Descovy 200-25mg  qd -Bactrim OI ppx   #Hx Hepatitis C: Patient had reactive Hep C on 09/2017, RNA 2,220,000. Pharmacy would like repeat HCV quant PCR pending.  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 Patient afebrile without leukocytosis. We ordered infectious workup. UA with rare bacteria, blood cultures pending, urine culture pending, CXR clear without any acute abnormalities. Low concern for IRIS at this time.  -blood cultures NGTD -urine culture multiple species -continue to monitor vitals and for signs of infection, the hallmark of the syndrome is paradoxical worsening of an existing infection or disease  process or appearance of a new infection/disease process soon after initiation of ART.   #Thrush Patient with white plaques on tongue on exam consistent with thrush. Treated with oral fluconazole given immunocompromised state.  -fluconazole 200mg  qd -nystatin mouthwash  #Severe Malnutrition 2/2 chronic illnesses Patient with BMI 15.98 with severe fat depletion, severe muscle depletion. Pt has lost ~22 lbs (14.5%) in the past year. Not necessarily significant for the time frame, but it is concerning.  -Dietitian gave recommendations, appreciate recs -glucerna nutritional supps  #Polysubstance abuse Patient reports previous heavy alcohol and crack cocaine use. Patient states last alcohol and cocaine use was one month ago. He had Dts for two days after quitting. He continues to smoke cigarettes 1/2 PPD. -Nicotine patch  #HLD Patient has history of HLD. He was restarted on home atorvastatin 40mg . Patient has lipid panel pending from recent ID clinic visit.  -atorvastatin  40mg   #Hx of Psychosis #Insomnia Patient has history of reported schizophrenia. However on our examination patient with linear thought process, without hallucinations or paranoia. Given history of crack cocaine use, patient likely had component of drug induced psychosis. Patient was prescribed Seroquel though does not take this due to side effect of sedation. Since patient endorses sobriety and without signs of psychosis on psych exam, we are holding antipsychotics. -PRN Ambien for sleep   Prior to Admission Living Arrangement: unhomed Anticipated Discharge Location: return to unhomed environment with [facility] OP support Barriers to Discharge:continuing to manage hyperglycemia inpatient Dispo: Anticipated discharge in approximately 1 day(s).   , MD 02/14/21, 12:16 PM  Pager: [phone] Internal Medicine Resident, PGY-1 [facility]

## 2021-02-15 ENCOUNTER — Other Ambulatory Visit (HOSPITAL_COMMUNITY): Payer: Self-pay

## 2021-02-15 DIAGNOSIS — E1110 Type 2 diabetes mellitus with ketoacidosis without coma: Secondary | ICD-10-CM | POA: Diagnosis not present

## 2021-02-15 DIAGNOSIS — B20 Human immunodeficiency virus [HIV] disease: Secondary | ICD-10-CM | POA: Diagnosis not present

## 2021-02-15 LAB — COMPREHENSIVE METABOLIC PANEL
ALT: 13 U/L (ref 0–44)
AST: 17 U/L (ref 15–41)
Albumin: 2.5 g/dL — ABNORMAL LOW (ref 3.5–5.0)
Alkaline Phosphatase: 55 U/L (ref 38–126)
Anion gap: 8 (ref 5–15)
BUN: 15 mg/dL (ref 6–20)
CO2: 23 mmol/L (ref 22–32)
Calcium: 9.5 mg/dL (ref 8.9–10.3)
Chloride: 101 mmol/L (ref 98–111)
Creatinine, Ser: 0.81 mg/dL (ref 0.61–1.24)
GFR, Estimated: >60 mL/min (ref >60)
Glucose, Bld: 217 mg/dL — ABNORMAL HIGH (ref 70–99)
Potassium: 4.1 mmol/L (ref 3.5–5.1)
Sodium: 132 mmol/L — ABNORMAL LOW (ref 135–145)
Total Bilirubin: 0.3 mg/dL (ref 0.3–1.2)
Total Protein: 8.1 g/dL (ref 6.5–8.1)

## 2021-02-15 LAB — GLUCOSE, CAPILLARY
Glucose-Capillary: 222 mg/dL — ABNORMAL HIGH (ref 70–99)
Glucose-Capillary: 211 mg/dL — ABNORMAL HIGH (ref 70–99)
Glucose-Capillary: 98 mg/dL (ref 70–99)

## 2021-02-15 MED ORDER — INSULIN GLARGINE 100 UNIT/ML SOLOSTAR PEN
45.0000 [IU] | PEN_INJECTOR | Freq: Every day | SUBCUTANEOUS | 11 refills | Status: DC
Start: 2021-02-15 — End: 2021-12-17
  Filled 2021-02-15: qty 15, 30d supply, fill #0
  Filled 2021-02-15: qty 15, 33d supply, fill #0

## 2021-02-15 MED ORDER — FLUCONAZOLE 200 MG PO TABS
200.0000 mg | ORAL_TABLET | Freq: Every day | ORAL | 0 refills | Status: DC
  Filled 2021-02-15: qty 9, 9d supply, fill #0

## 2021-02-15 MED ORDER — INSULIN ASPART 100 UNIT/ML FLEXPEN
20.0000 [IU] | PEN_INJECTOR | Freq: Three times a day (TID) | SUBCUTANEOUS | 11 refills | Status: DC
  Filled 2021-02-15: qty 18, 30d supply, fill #0

## 2021-02-15 MED ORDER — INSULIN GLARGINE 100 UNIT/ML ~~LOC~~ SOLN
45.0000 [IU] | Freq: Every day | SUBCUTANEOUS | Status: DC
  Administered 2021-02-15: 45 [IU] via SUBCUTANEOUS
  Filled 2021-02-15 (×2): qty 0.45

## 2021-02-15 NOTE — Plan of Care (Signed)

## 2021-02-15 NOTE — Discharge Instructions (Addendum)
You were hospitalized for high blood sugar. We restarted you on the diabetes medications below Thank you for allowing [place] to be part of your care.   We arranged for you to follow up at our internal medicine clinic here at [place], they will call you to make an appointment.   Your next appointment with infectious disease is 03/11/2021   Please note these changes made to your medications:   Please START taking:  Lantus 45units at bedtime Novolog 20units with meals Metformin 500mg  twice a day  Please STOP taking:  Seroquel Hydrazaline  Your follow up appointment is 7/21 at 2:45 pm at our clinic at [address] [facility] [place]. You can come from the main entrance of [facility] and they can help bring you down to our clinic.  Please call our clinic if you have any questions or concerns, we may be able to help and keep you from a long and expensive emergency room wait. Our clinic and after hours phone number is [phone], the best time to call is Monday through Friday 9 am to 4 pm but there is always someone available 24/7 if you have an emergency. If you need medication refills please notify your pharmacy one week in advance and they will send Monday a request.

## 2021-02-15 NOTE — TOC Transition Note (Addendum)
Transition of Care [facility]) - CM/SW Discharge Note   Patient Details  Name: Derrick Cooper MRN: [id number] Date of Birth: May 14, 1961  Transition of Care [facility]) CM/SW Contact:  [name], LCSW Phone Number: 02/15/2021, 2:45 PM   Clinical Narrative:    1pm-CSW will arrange [facility] to the [facility] before they close at 3pm; RN to notify once patient ready to leave. Meds and glucometer to be provided by [facility] pharmacy.   2:30pm-CSW verified with [facility] that patient's profile is already in the system and ready to go once patient is ready. CSW touched base with RN.   3:30pm-CSW received request to speak with patient's sister at bedside. CSW answered questions about resources for housing and patient's sister described difficulty in patient obtaining housing through his [address] [employer] voucher due to his criminal record 20 years ago and now the voucher has expired. She is going to take patient to DSS in the morning to apply for another voucher. Patient stated he had an ACTT team that has not been helpful and he has not seen them. Patient inquired as to why he missed his 3pm deadline to get discharged because now the [facility] is closed. CSW asked patient if anyone had come by to mention a delay and patient stated something about his insulin needing to be refrigerated. CSW is under the impression that the insulin does not need to be refrigerated after opening so unsure why this is a delay for patient as he was to keep his pens at the [facility] and take enough with him to last the weekend when they are closed. CSW will investigate. Patient's sister stated she is going to try to find patient somewhere to stay tonight.  Per RN, he was told by the pharmacy team member who delivered the medications to the room that the insulin needed to be refrigerated so the RN reached out to the [facility] about obtaining a cooler.   CSW contacted RNCM who was also unaware of any insulin pens that were required to be refrigerated and unsure  of how to obtain a cooler as patient would not be able to keep it filled with ice. She will contact TOC pharmacy to see what the issue is.   4:23pm- RNCM, Kristi contacted CSW back. She stated the pharmacy team member that delivered the medications did tell the patient the insulin needed to be refrigerated. Kristi then confirmed with the Pharmacist that this was untrue and the patient would be able to carry the insulin with him at room temperature after being opened and not need refrigeration, especially due to his social circumstances.   Final next level of care: Home/Self Care Barriers to Discharge: No Barriers Identified   Patient Goals and CMS Choice        Discharge Placement                       Discharge Plan and Services In-house Referral: Clinical Social Work                                   Social Determinants of Health (SDOH) Interventions     Readmission Risk Interventions Readmission Risk Prevention Plan 01/03/2020  Transportation Screening Complete  Medication Review [facility]) Complete  PCP or Specialist appointment within 3-5 days of discharge Complete  HRI or Home Care Consult Complete  SW Recovery Care/Counseling Consult Complete  Palliative Care  Screening Not Applicable  Skilled Nursing Facility Not Applicable  Some recent data might be hidden

## 2021-02-16 LAB — CULTURE, BLOOD (ROUTINE X 2)
Culture: NO GROWTH
Culture: NO GROWTH
Special Requests: ADEQUATE
Special Requests: ADEQUATE

## 2021-02-16 LAB — HCV RNA QUANT: HCV Quantitative: NOT DETECTED IU/mL (ref >50)

## 2021-02-16 LAB — GLUCOSE, CAPILLARY
Glucose-Capillary: 293 mg/dL — ABNORMAL HIGH (ref 70–99)
Glucose-Capillary: 295 mg/dL — ABNORMAL HIGH (ref 70–99)

## 2021-02-16 MED ORDER — RAMELTEON 8 MG PO TABS
8.0000 mg | ORAL_TABLET | Freq: Every day | ORAL | Status: DC
  Administered 2021-02-16: 8 mg via ORAL
  Filled 2021-02-16: qty 1

## 2021-02-16 NOTE — Progress Notes (Signed)
Patient was medically discharged on 02/15/2021. Patient remained in the hospital for non medical barriers to discharge: Patient was unable to discharge safely from the hospital due to socioeconomic reasons. Patient was seen on rounds. He has no new complaints. He feels ready for discharge.  There is no change to his discharge plan. Patient was notified of follow up appointment date and time with [facility]

## 2021-02-16 NOTE — Progress Notes (Signed)
Patient educated on discharge instructions. IV's removed with no complaints. All belongings gathered and placed in a personal items bag. No complaints or concerns stated at this time.

## 2021-02-16 NOTE — Plan of Care (Signed)

## 2021-02-16 NOTE — Progress Notes (Signed)
PT Cancellation Note  Patient Details Name: Derrick Cooper MRN: [id number] DOB: 1960/10/22   Cancelled Treatment:    Reason Eval/Treat Not Completed: Other (comment) per CSW, likely discharging today. Per prior PT notes, he is ambulatory at an independent to supervision level. Holding for now. Will f/u tomorrow if he ends up still being in house. Thank you for the opportunity to participate in his care!   Madelaine Etienne, DPT, PN1   Supplemental Physical Therapist [facility]    Pager [phone] Acute Rehab Office [phone]

## 2021-02-17 ENCOUNTER — Telehealth: Payer: Self-pay | Admitting: *Deleted

## 2021-02-17 ENCOUNTER — Encounter: Payer: Self-pay | Admitting: *Deleted

## 2021-02-17 NOTE — Congregational Nurse Program (Signed)
  Dept: [phone]   Congregational Nurse Program Note  Date of Encounter: 02/17/2021  Past Medical History: Past Medical History:  Diagnosis Date   Diabetes mellitus without complication (HCC)    Hepatitis C    HIV infection (HCC)    Schizophrenia (HCC)    Substance abuse (HCC)     Encounter Details:  CNP Questionnaire - 02/17/21 1240       Questionnaire   Do you give verbal consent to treat you today? Yes    Visit Setting Church or Organization    Location Patient Served At [facility] [place]    Patient Status Homeless    Medical Provider Yes    Insurance Medicaid    Intervention Support;Educate    Housing/Utilities No permanent housing    Transportation Provided transportation assistance ([facility],bus pass, taxi voucher, etc.)            Client came to writer's office requesting a CBG check and help getting to his doctor's appointments. Checked CBG 293. Client said he had insulin but no needle to check his sugar or place to store insulin on the weekends. Checked clients hospital discharge papers. He has an appt tomorrow with with [facility]. Gave two bus passes for transportation to and from appt. Assisted client with cbg supplies and found his lancets in pocket of his meter case. Educated client on how to use insulin pen. Client gave himself insulin while at [facility]. Called [facility] to check on his medications and he had three ready. Gave two more bus passes for client to pick up medication. Talked with visitor at [facility] donating supplies and requested a small cooler with ice packs/dry ice for insulin storage over weekend. He will check and bring to [facility] tomorrow. IRC front desk staff aware. Laurent Cargile W RN CN [phone]

## 2021-02-17 NOTE — Telephone Encounter (Signed)
Contacted pharmacy for client to see if medications were ready for pick up. Client had three medications ready.

## 2021-02-18 ENCOUNTER — Ambulatory Visit (INDEPENDENT_AMBULATORY_CARE_PROVIDER_SITE_OTHER): Payer: Medicaid Other | Admitting: Internal Medicine

## 2021-02-18 ENCOUNTER — Encounter: Payer: Self-pay | Admitting: Internal Medicine

## 2021-02-18 ENCOUNTER — Other Ambulatory Visit: Payer: Self-pay

## 2021-02-18 VITALS — BP 125/91 | HR 118 | Ht 74.0 in | Wt 133.2 lb

## 2021-02-18 DIAGNOSIS — Z0000 Encounter for general adult medical examination without abnormal findings: Secondary | ICD-10-CM

## 2021-02-18 DIAGNOSIS — B20 Human immunodeficiency virus [HIV] disease: Secondary | ICD-10-CM

## 2021-02-18 DIAGNOSIS — E1165 Type 2 diabetes mellitus with hyperglycemia: Secondary | ICD-10-CM

## 2021-02-18 DIAGNOSIS — IMO0002 Reserved for concepts with insufficient information to code with codable children: Secondary | ICD-10-CM

## 2021-02-18 DIAGNOSIS — Z794 Long term (current) use of insulin: Secondary | ICD-10-CM | POA: Diagnosis not present

## 2021-02-18 DIAGNOSIS — F200 Paranoid schizophrenia: Secondary | ICD-10-CM

## 2021-02-18 DIAGNOSIS — E1169 Type 2 diabetes mellitus with other specified complication: Secondary | ICD-10-CM

## 2021-02-18 DIAGNOSIS — B351 Tinea unguium: Secondary | ICD-10-CM | POA: Diagnosis not present

## 2021-02-18 DIAGNOSIS — E785 Hyperlipidemia, unspecified: Secondary | ICD-10-CM | POA: Diagnosis not present

## 2021-02-18 DIAGNOSIS — E11610 Type 2 diabetes mellitus with diabetic neuropathic arthropathy: Secondary | ICD-10-CM | POA: Diagnosis not present

## 2021-02-18 DIAGNOSIS — B370 Candidal stomatitis: Secondary | ICD-10-CM

## 2021-02-18 LAB — GLUCOSE, CAPILLARY: Glucose-Capillary: 199 mg/dL — ABNORMAL HIGH (ref 70–99)

## 2021-02-18 MED ORDER — BD GLUCOSE 5 G PO CHEW: 3.0000 | CHEWABLE_TABLET | ORAL | 3 refills | Status: DC | PRN

## 2021-02-18 MED ORDER — FLUCONAZOLE 200 MG PO TABS: 200.0000 mg | ORAL_TABLET | Freq: Every day | ORAL | 0 refills | Status: DC

## 2021-02-18 MED ORDER — TRULICITY 0.75 MG/0.5ML ~~LOC~~ SOAJ: 0.7500 mg | SUBCUTANEOUS | 3 refills | Status: AC

## 2021-02-18 MED ORDER — FLUCONAZOLE 200 MG PO TABS: 200.0000 mg | ORAL_TABLET | Freq: Every day | ORAL | 0 refills | Status: AC

## 2021-02-18 MED ORDER — TRULICITY 0.75 MG/0.5ML ~~LOC~~ SOAJ: 0.7500 mg | SUBCUTANEOUS | 3 refills | Status: DC

## 2021-02-18 NOTE — Assessment & Plan Note (Signed)
Lipid panel drawn at ID visit 02/08/2021 has not yet reported. Patient is currently taking atorvastatin 40 mg once daily. He denies GI upset, muscle cramps. - Continue atorvastatin 40 mg once daily

## 2021-02-18 NOTE — Assessment & Plan Note (Addendum)
POC blood glucose in clinic was 199. Patient expresses concern that he does not have a consistently accessible place to store insulin in refrigerator. He also admits he has forgotten to take some doses of metformin. He does not have access to a consistently healthy diet but denies consumption of sodas or juices. Denies alcohol or recreational drug use. He did not have his meter with him today but states that his sugars have been in the 200s, and he has not had any hypoglycemic episodes. - Counseled patient that his insulin does not need to be refrigerated. - Continue metformin 1000 mg twice daily - Continue novolog 25-30 units injection before meals - Increase lantus to 40 units injection each night - Begin trulicity 0.75 mg injection once weekly - Glucose tablet sent to pharmacy for patient to have in case of hypoglycemic episodes - Urine microalbumin/creatinine ratio collected  Addendum 02/23/2021: Urine microalbumin/creatinine elevated at 47. Given patient's history of uncontrolled DM and proteinuria, future consideration could be given to starting a low dose ACEI/ARB; however, he has trended hypotensive in the recent past and is facing care barriers so the initiation of medication to combat progression of proteinuria or kidney disease can be discussed in future visits.

## 2021-02-18 NOTE — Progress Notes (Signed)
CC: hospital follow-up, diabetes management  HPI:  [name] is a 60 y.o. male who presents for hospital follow up where he was found to have diabetic ketoacidosis, oral thrush, and malnutrition. He also has a history of HIV, schizophrenia, hepatitis C, and substance abuse.  He states that at this time he is doing okay though he is still concerned that his blood sugars have been high. He is worried that he can't keep his insulin refrigerated over the weekend as he is homeless and does not have access to the facility with said refrigerator over the weekend.   He also is in need of a psychiatrist to help manage his schizophrenia. He has seroquel though he does not take it daily. He does say that he took it last night because he hadn't slept in 9 days; he was able to sleep after taking this. He denies current auditory or visual hallucinations and explains that the movie "A Beautiful Life" gave him techniques for managing his schizophrenia and knows to go to the emergency room for help if he starts to have these hallucinations.   He denies current drug or alcohol use. He is still smoking 1/2 ppd but wants to stop that as well. He states that on August 1, his sister in [place], [place] is offering to let him move in with her.   Past Medical History:  Diagnosis Date   Diabetes mellitus without complication (HCC)    Hepatitis C    HIV infection (HCC)    Schizophrenia (HCC)    Substance abuse (HCC)    Review of Systems:  Review of Systems  Constitutional:  Negative for chills, fever and weight loss.  HENT:  Negative for ear pain and hearing loss.   Eyes:  Negative for blurred vision and double vision.  Respiratory:  Negative for shortness of breath.   Cardiovascular:  Negative for chest pain, palpitations and orthopnea.  Gastrointestinal:  Positive for constipation. Negative for abdominal pain and diarrhea.       Incontinent, wears diapers  Genitourinary:        Incontinent, wears  diapers  Musculoskeletal:  Negative for back pain, joint pain and myalgias.  Neurological:  Positive for sensory change (toes are numb). Negative for dizziness, loss of consciousness, weakness and headaches.  Psychiatric/Behavioral:  Positive for depression (but improving with better access to basic needs) and hallucinations (related to schizophrenia. not as frequent as in past. does take seroquel sometimes.). The patient has insomnia.   All other systems reviewed and are negative.   Physical Exam:  Vitals:   02/18/21 1436  BP: (!) 125/91  Pulse: (!) 118  SpO2: 100%  Weight: 133 lb 3.2 oz (60.4 kg)  Height: 6\' 2"  (1.88 m)   Physical Exam Constitutional:      General: He is not in acute distress.    Appearance: He is underweight.  HENT:     Head: Normocephalic.     Jaw: There is normal jaw occlusion.     Right Ear: Hearing, tympanic membrane and external ear normal. There is impacted cerumen.     Left Ear: Hearing, tympanic membrane and external ear normal. There is impacted cerumen.     Nose: Nose normal.     Mouth/Throat:     Lips: Pink.     Mouth: Mucous membranes are dry.  Eyes:     General: Lids are normal.     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  Cardiovascular:     Rate and Rhythm: Regular rhythm. Tachycardia present.     Pulses:          Radial pulses are 2+ on the right side and 2+ on the left side.       Dorsalis pedis pulses are 2+ on the right side and 2+ on the left side.       Posterior tibial pulses are 2+ on the right side and 2+ on the left side.     Heart sounds: Normal heart sounds.  Pulmonary:     Effort: Pulmonary effort is normal.     Breath sounds: Normal breath sounds and air entry.  Abdominal:     General: Abdomen is flat.     Palpations: Abdomen is soft.     Tenderness: There is no abdominal tenderness.  Musculoskeletal:     Right lower leg: No edema.     Left lower leg: No edema.  Feet:     Right  foot:     Protective Sensation: 9 sites tested.   1 site sensed.    Skin integrity: Skin integrity normal.     Toenail Condition: Right toenails are abnormally thick and long. Fungal disease present.    Left foot:     Protective Sensation: 9 sites tested.   1 site sensed.    Skin integrity: Skin integrity normal.     Toenail Condition: Left toenails are abnormally thick and long. Fungal disease present. Lymphadenopathy:     Cervical: No cervical adenopathy.  Skin:    General: Skin is warm and dry.     Nails: There is no clubbing.  Neurological:     General: No focal deficit present.     Mental Status: He is alert and oriented to person, place, and time.  Psychiatric:        Attention and Perception: Attention and perception normal.        Behavior: Behavior normal. Behavior is cooperative.        Thought Content: Thought content normal.     Assessment & Plan:   See Encounters Tab for problem based charting.  Patient seen with Dr. Antony Contras

## 2021-02-18 NOTE — Assessment & Plan Note (Signed)
Present on bilateral feet.  - Referral to podiatry

## 2021-02-18 NOTE — Assessment & Plan Note (Signed)
Patient only received 6 days worth of diflucan treatment as his outpatient prescription was not picked up on discharge. - Remainder of 14 day course diflucan 200 mg once daily sent to pharmacy - Will recheck in 2 weeks

## 2021-02-18 NOTE — Assessment & Plan Note (Addendum)
Patient has never had a colonoscopy before.  - Referral to GI for colonoscopy  Patient has barriers to care including homelessness, lack of transportation. - Referral to case management placed.  Patient denied: - Eye exam: reports that annual eye exam is up to date - Tetanus booster: reports having received booster in the last 3-4 years - Shingrix: reports having had shingles. Counseled him that he can still get shingles again, and that the vaccine can be helpful in preventing bad symptoms if he were reinfected.

## 2021-02-18 NOTE — Assessment & Plan Note (Signed)
Patient has reestablished care with ID.  - Continue Tivicay 50 mg once daily - Continue Descovy 200-25 mg once daily

## 2021-02-18 NOTE — Assessment & Plan Note (Signed)
Patient states that he has been seen by [facility] in the past but wishes to see a new psychiatrist. He is not taking seroquel daily. He denies auditory or visual hallucinations. He notes that he knows to seek care if he does experience hallucinations. - Referral to psychiatry

## 2021-02-18 NOTE — Patient Instructions (Addendum)
Thank you for visiting the Internal Medicine Clinic today. It was a pleasure to meet you! Today we discussed   I have ordered the following for you:  Lab orders: Urine microalbumin/creatine ratio: to check your kidneys  Referrals: Podiatry Gastroenterology for colonoscopy Psychiatry  Medication changes: Diflucan 200 mg called in to pharmacy. Please complete this full course. Trulicity: start 0.75 mg injection once per week Increase Lantus daily injection to 40 units Glucose tablets to have in the event of low blood sugar and no access to food  Follow-up: Please follow-up with [place] in 2 weeks.  Remember: If you have any questions or concerns, please call our clinic at [phone] between 9am-5pm and after hours call [phone] and ask for the internal medicine resident on call. If you feel you are having a medical emergency please call 911.  Champ Mungo, DO

## 2021-02-19 ENCOUNTER — Encounter: Payer: Self-pay | Admitting: *Deleted

## 2021-02-19 LAB — COMPLETE METABOLIC PANEL WITH GFR
AG Ratio: 0.6 (calc) — ABNORMAL LOW (ref 1.0–2.5)
ALT: 10 U/L (ref 9–46)
AST: 11 U/L (ref 10–35)
Albumin: 3.3 g/dL — ABNORMAL LOW (ref 3.6–5.1)
Alkaline phosphatase (APISO): 70 U/L (ref 35–144)
BUN: 20 mg/dL (ref 7–25)
CO2: 21 mmol/L (ref 20–32)
Calcium: 9.1 mg/dL (ref 8.6–10.3)
Chloride: 88 mmol/L — ABNORMAL LOW (ref 98–110)
Creat: 1.19 mg/dL (ref 0.70–1.30)
Globulin: 5.1 g/dL (calc) — ABNORMAL HIGH (ref 1.9–3.7)
Glucose, Bld: 1067 mg/dL (ref 65–99)
Potassium: 4.8 mmol/L (ref 3.5–5.3)
Sodium: 126 mmol/L — ABNORMAL LOW (ref 135–146)
Total Bilirubin: 0.4 mg/dL (ref 0.2–1.2)
Total Protein: 8.4 g/dL — ABNORMAL HIGH (ref 6.1–8.1)
eGFR: 71 mL/min/{1.73_m2} (ref >=60)

## 2021-02-19 LAB — LIPID PANEL
Cholesterol: 172 mg/dL (ref <200)
HDL: 18 mg/dL — ABNORMAL LOW (ref >=40)
LDL Cholesterol (Calc): 123 mg/dL (calc) — ABNORMAL HIGH
Non-HDL Cholesterol (Calc): 154 mg/dL (calc) — ABNORMAL HIGH (ref <130)
Total CHOL/HDL Ratio: 9.6 (calc) — ABNORMAL HIGH (ref <5.0)
Triglycerides: 193 mg/dL — ABNORMAL HIGH (ref <150)

## 2021-02-19 LAB — HIV-1 INTEGRASE GENOTYPE

## 2021-02-19 LAB — MICROALBUMIN / CREATININE URINE RATIO
Creatinine, Urine: 81.6 mg/dL
Microalb/Creat Ratio: 47 mg/g creat — ABNORMAL HIGH (ref 0–29)
Microalbumin, Urine: 38.2 ug/mL

## 2021-02-19 LAB — HIV RNA, RTPCR W/R GT (RTI, PI,INT)
HIV 1 RNA Quant: 3310 copies/mL — ABNORMAL HIGH
HIV-1 RNA Quant, Log: 3.52 Log copies/mL — ABNORMAL HIGH

## 2021-02-19 LAB — RPR: RPR Ser Ql: NONREACTIVE

## 2021-02-19 LAB — HEMOGLOBIN A1C
Hgb A1c MFr Bld: 13.2 % of total Hgb — ABNORMAL HIGH (ref <5.7)
Mean Plasma Glucose: 332 mg/dL
eAG (mmol/L): 18.4 mmol/L

## 2021-02-19 LAB — HIV-1 GENOTYPE: HIV-1 Genotype: DETECTED — AB

## 2021-02-19 NOTE — Congregational Nurse Program (Signed)
  Dept: [phone]   Congregational Nurse Program Note  Date of Encounter: 02/19/2021  Past Medical History: Past Medical History:  Diagnosis Date   Diabetes mellitus without complication (HCC)    Hepatitis C    HIV infection (HCC)    Schizophrenia (HCC)    Substance abuse (HCC)     Encounter Details:  CNP Questionnaire - 02/19/21 1417       Questionnaire   Do you give verbal consent to treat you today? Yes    Visit Setting Church or Organization    Location Patient Served At [facility]    Patient Status Homeless    Medical Provider Yes    Insurance Medicaid    Intervention Support    Housing/Utilities No permanent housing    Transportation Provided transportation assistance ([facility],bus pass, taxi voucher, etc.)             Client came by nurses office requesting help with transportation to the drug store. Provided two bus passes. Client reports his sister is planning to provide him a place to stay and he plans to move at the beginning of next month.

## 2021-02-24 ENCOUNTER — Telehealth: Payer: Self-pay

## 2021-02-24 NOTE — Telephone Encounter (Signed)
   Telephone encounter was:  Unsuccessful.  02/24/2021 Name: Derrick Cooper MRN: [id number] DOB: 06-12-1961  Unsuccessful outbound call made today to assist with:  Transportation Needs   Outreach Attempt:  1st Attempt  Unable to leave message voicemail is full.  Jeronda Don, AAS Paralegal, [facility] Care Guide  Embedded Care Coordination [place]  Care Management  [address] [place], [place] [address] ??[email or web address]  ?? [phone]   [email or web address]

## 2021-02-25 ENCOUNTER — Encounter: Payer: Medicaid Other | Admitting: Internal Medicine

## 2021-03-01 ENCOUNTER — Telehealth: Payer: Self-pay

## 2021-03-01 NOTE — Telephone Encounter (Signed)
   Telephone encounter was:  Successful.  03/01/2021 Name: [name] MRN: [id number] DOB: 07-23-1961  Derrick Cooper is a 60 y.o. year old male who is a primary care patient of Bryon Lions, PA-C . The community resource team was consulted for assistance with Transportation Needs   Care guide performed the following interventions: Spoke with patient's sister [name] gave her information for [employer].  Follow Up Plan:  No further follow up planned at this time. The patient has been provided with needed resources.  Chirstine Defrain, AAS Paralegal, [facility] Care Guide  Embedded Care Coordination [place]  Care Management  [address] [place], [place] [address] ??[email or web address]  ?? [phone]   [email or web address]

## 2021-03-01 NOTE — Addendum Note (Signed)
Addended by: Burnell Blanks on: 03/01/2021 10:17 AM   Modules accepted: Level of Service

## 2021-03-01 NOTE — Progress Notes (Signed)
Internal Medicine Clinic Attending  I saw and evaluated the patient.  I personally confirmed the key portions of the history and exam documented by Dr.  Dean  and I reviewed pertinent patient test results.  The assessment, diagnosis, and plan were formulated together and I agree with the documentation in the resident's note.  

## 2021-03-11 ENCOUNTER — Ambulatory Visit: Payer: Medicaid Other | Admitting: Family

## 2021-03-25 ENCOUNTER — Telehealth: Payer: Self-pay | Admitting: Behavioral Health

## 2021-11-14 ENCOUNTER — Ambulatory Visit (HOSPITAL_COMMUNITY)
Admission: EM | Admit: 2021-11-14 | Discharge: 2021-11-14 | Disposition: A | Discharge status: Other Acute Inpatient Hospital | Payer: Medicaid Other

## 2021-11-14 DIAGNOSIS — F29 Unspecified psychosis not due to a substance or known physiological condition: Secondary | ICD-10-CM

## 2021-11-14 DIAGNOSIS — F1990 Other psychoactive substance use, unspecified, uncomplicated: Secondary | ICD-10-CM

## 2021-11-14 NOTE — BH Assessment (Signed)
Per [employer] [place] [employer] called to assist pt due to patient mental status. Pt slept on street last night, has been without his medications and has a history of substance use. ?
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
## 2021-11-15 ENCOUNTER — Encounter (HOSPITAL_COMMUNITY): Payer: Self-pay

## 2021-11-15 ENCOUNTER — Ambulatory Visit (HOSPITAL_COMMUNITY)
Admission: EM | Admit: 2021-11-15 | Discharge: 2021-11-15 | Disposition: A | Discharge status: Home / Self Care | Payer: Medicaid Other | Attending: Psychiatry | Admitting: Psychiatry

## 2021-11-15 ENCOUNTER — Other Ambulatory Visit: Payer: Self-pay

## 2021-11-15 ENCOUNTER — Emergency Department (HOSPITAL_COMMUNITY)
Admission: EM | Admit: 2021-11-15 | Discharge: 2021-11-16 | Disposition: A | Discharge status: Home / Self Care | Payer: Medicaid Other | Attending: Emergency Medicine | Admitting: Emergency Medicine

## 2021-11-15 DIAGNOSIS — E119 Type 2 diabetes mellitus without complications: Secondary | ICD-10-CM | POA: Insufficient documentation

## 2021-11-15 DIAGNOSIS — F17200 Nicotine dependence, unspecified, uncomplicated: Secondary | ICD-10-CM | POA: Insufficient documentation

## 2021-11-15 DIAGNOSIS — Z794 Long term (current) use of insulin: Secondary | ICD-10-CM | POA: Diagnosis not present

## 2021-11-15 DIAGNOSIS — B20 Human immunodeficiency virus [HIV] disease: Secondary | ICD-10-CM | POA: Insufficient documentation

## 2021-11-15 DIAGNOSIS — F1010 Alcohol abuse, uncomplicated: Secondary | ICD-10-CM

## 2021-11-15 DIAGNOSIS — Z7984 Long term (current) use of oral hypoglycemic drugs: Secondary | ICD-10-CM | POA: Diagnosis not present

## 2021-11-15 DIAGNOSIS — L02416 Cutaneous abscess of left lower limb: Secondary | ICD-10-CM | POA: Diagnosis not present

## 2021-11-15 DIAGNOSIS — R45851 Suicidal ideations: Secondary | ICD-10-CM | POA: Insufficient documentation

## 2021-11-15 DIAGNOSIS — Z20822 Contact with and (suspected) exposure to covid-19: Secondary | ICD-10-CM | POA: Diagnosis not present

## 2021-11-15 DIAGNOSIS — F250 Schizoaffective disorder, bipolar type: Secondary | ICD-10-CM | POA: Diagnosis not present

## 2021-11-15 DIAGNOSIS — E118 Type 2 diabetes mellitus with unspecified complications: Secondary | ICD-10-CM

## 2021-11-15 DIAGNOSIS — F1020 Alcohol dependence, uncomplicated: Secondary | ICD-10-CM | POA: Insufficient documentation

## 2021-11-15 DIAGNOSIS — Z5900 Homelessness unspecified: Secondary | ICD-10-CM | POA: Insufficient documentation

## 2021-11-15 DIAGNOSIS — R000 Tachycardia, unspecified: Secondary | ICD-10-CM | POA: Diagnosis not present

## 2021-11-15 DIAGNOSIS — F1420 Cocaine dependence, uncomplicated: Secondary | ICD-10-CM | POA: Diagnosis not present

## 2021-11-15 DIAGNOSIS — B182 Chronic viral hepatitis C: Secondary | ICD-10-CM

## 2021-11-15 DIAGNOSIS — Z0279 Encounter for issue of other medical certificate: Secondary | ICD-10-CM | POA: Diagnosis present

## 2021-11-15 DIAGNOSIS — Z21 Asymptomatic human immunodeficiency virus [HIV] infection status: Secondary | ICD-10-CM | POA: Insufficient documentation

## 2021-11-15 DIAGNOSIS — F332 Major depressive disorder, recurrent severe without psychotic features: Secondary | ICD-10-CM | POA: Insufficient documentation

## 2021-11-15 DIAGNOSIS — F1910 Other psychoactive substance abuse, uncomplicated: Secondary | ICD-10-CM

## 2021-11-15 DIAGNOSIS — Z760 Encounter for issue of repeat prescription: Secondary | ICD-10-CM | POA: Insufficient documentation

## 2021-11-15 DIAGNOSIS — F209 Schizophrenia, unspecified: Secondary | ICD-10-CM

## 2021-11-15 MED ORDER — SODIUM CHLORIDE 0.9 % IV BOLUS: 1000.0000 mL | Freq: Once | INTRAVENOUS | Status: DC

## 2021-11-15 MED ORDER — QUETIAPINE FUMARATE 100 MG PO TABS: 100.0000 mg | ORAL_TABLET | Freq: Every day | ORAL | 0 refills | Status: DC

## 2021-11-15 MED ORDER — QUETIAPINE FUMARATE 100 MG PO TABS
100.0000 mg | ORAL_TABLET | Freq: Every day | ORAL | Status: DC
Start: 2021-11-15 — End: 2021-11-15
  Filled 2021-11-15: qty 7

## 2021-11-15 MED ORDER — METFORMIN HCL 500 MG PO TABS
500.0000 mg | ORAL_TABLET | Freq: Two times a day (BID) | ORAL | Status: DC
  Filled 2021-11-15: qty 14

## 2021-11-15 MED ORDER — METFORMIN HCL 500 MG PO TABS: 500.0000 mg | ORAL_TABLET | Freq: Two times a day (BID) | ORAL | 0 refills | Status: DC

## 2021-11-15 NOTE — BH Assessment (Signed)
Per [employer], pt had someone call 911 and when they got to pt he requested a ride to come here so he can get back on his medicaitons. TTS triaged pt yesterday and suggested to pt that he should have stayed yesterday for treatment. Pt stated "I know because it was cold last night but I had to get me one last hit" referring to drug use. Pt reports he would like medications and drug rehab treatment. Pt denies SI, HI, AVH.  ?

## 2021-11-15 NOTE — ED Provider Notes (Signed)
Behavioral Health Urgent Care Medical Screening Exam ? ?Patient Name: Derrick Cooper ?MRN: [id number] ?Date of Evaluation: 11/15/21 ?Chief Complaint:   ?Diagnosis:  ?Final diagnoses:  ?Schizophrenia, unspecified type (HCC)  ? ? ?History of Present illness: Derrick Cooper is a 61 y.o. male patient presented to [name] as a walk in via [employer] at patients request.he is requesting to be restarted on his medications.  ? ?Derrick Cooper, 61 y.o., male patient seen face to face by this provider, consulted with [name]; and chart reviewed on 11/15/21.  ? ?Patient reports a past psychiatric history of schizophrenia.  He is diagnosed with HIV.  Per chart review he has previously endorsed alcohol withdrawal seizures and DTs.  However on this visit patient declines ever having any seizures or or DTs.  Per chart review there are no documented history for seizures or DTs. Patient was recently hospitalized at [facility] from 10/27/21-11/01/21 . He has  multiple [facility] visits in 2022, for substance use. Pt was at [facility] on 11/13/20-11/15/20, presenting w/ psychosis and SI with a plan to step in front of a train; [facility] from 10/23/20-10/26/20 for SI and reacting to internal stimuli; [facility] 09/11/20 for aggressive bhx; [facility] 09/06/20-09/09/20. ? ?In addition patient was seen at [facility] on 11/15/2021.  Per chart review he left AMA. ? ?On today's evaluation Derrick Cooper reports a history of alcohol, crack/cocaine, and methamphetamine use.  Reports his last use of cocaine and alcohol was last night.  He is alert/oriented x4 and cooperative.  He speaking in a clear tone, moderate volume, and normal pace.  He appears anxious and is restless. He makes good eye contact.  He endorses depression and increased anxiety.  He endorses a decrease in appetite and sleep.  He denies SI/HI/AVH.  He contracts for safety.  When asking patient why he left on 11/14/2021 AMA he states, "I needed to leave to get 1 more hit".  He objectively does not appear  to be responding to internal/external stimuli.  Reports he has auditory hallucinations at times but denies any today.  He denies paranoid and delusional thought content.  He shows no evidence of being psychotic.  Reports he uses alcohol and cocaine daily.  He states the amount depends on how much money he has at the time.  He drinks until he passes out.  He was offered resources for substance abuse treatment and declined. ? ?Patient is requesting to be restarted on his Seroquel and metformin.  Educated patient he will go to [facility] for medication refills and primary care needs.  He will be provided with a 7-day sample of Seroquel 100 mg nightly and metformin 500 mg twice daily.  He was provided with a printed prescription for 30-day supply.Educated patient on open access walk-in hours for [facility] behavioral health services on the second floor. ? ?Nursing staff provided AVS including samples.  After patient was discharged AVS was found in the lobby including the printed prescriptions.  Patient only took the 7-day supply of medications.   ? ? ?Psychiatric Specialty Exam ? ?Presentation  ?General Appearance:Disheveled ? ?Eye Contact:Good ? ?Speech:Clear and Coherent; Normal Rate ? ?Speech Volume:Normal ? ?Handedness:Right ? ? ?Mood and Affect  ?Mood:Depressed; Anxious ? ?Affect:Congruent ? ? ?Thought Process  ?Thought Processes:Coherent ? ?Descriptions of Associations:Intact ? ?Orientation:Full (Time, Place and Person) ? ?Thought Content:Logical ? Diagnosis of Schizophrenia or Schizoaffective disorder in past: Yes ? Duration of Psychotic Symptoms: Greater than six  months ? Hallucinations:None ?Reports hearing "blue devil" who is [address] and a "red devil" who is Satan ? ?Ideas of Reference:None ? ?Suicidal Thoughts:No ?With Plan ? ?Homicidal Thoughts:No ? ? ?Sensorium  ?Memory:Immediate Good; Recent Good; Remote Good ? ?Judgment:Fair ? ?Insight:Fair ? ? ?Executive Functions   ?Concentration:Good ? ?Attention Span:Good ? ?Recall:Good ? ?Fund of Knowledge:Good ? ?Language:Good ? ? ?Psychomotor Activity  ?Psychomotor Activity:Restlessness ? ? ?Assets  ?Assets:Communication Skills; Desire for Improvement; Physical Health; Resilience ? ? ?Sleep  ?Sleep:Fair ? ?Number of hours: No data recorded ? ?No data recorded ? ?Physical Exam: ?Physical Exam ?Vitals and nursing note reviewed.  ?Constitutional:   ?   General: He is not in acute distress. ?   Appearance: Normal appearance. He is well-developed.  ?HENT:  ?   Head: Normocephalic and atraumatic.  ?Eyes:  ?   General:     ?   Right eye: No discharge.     ?   Left eye: No discharge.  ?Cardiovascular:  ?   Rate and Rhythm: Normal rate.  ?Pulmonary:  ?   Effort: Pulmonary effort is normal. No respiratory distress.  ?Musculoskeletal:     ?   General: Normal range of motion.  ?   Cervical back: Normal range of motion.  ?Skin: ?   Coloration: Skin is not jaundiced or pale.  ?Neurological:  ?   Mental Status: He is alert and oriented to person, place, and time.  ?Psychiatric:     ?   Attention and Perception: Attention and perception normal.     ?   Mood and Affect: Mood is anxious and depressed.     ?   Speech: Speech normal.     ?   Behavior: Behavior is cooperative.     ?   Thought Content: Thought content normal.     ?   Cognition and Memory: Cognition normal.     ?   Judgment: Judgment is impulsive.  ? ?Review of Systems  ?Constitutional: Negative.   ?HENT: Negative.    ?Eyes: Negative.   ?Respiratory: Negative.    ?Cardiovascular: Negative.   ?Musculoskeletal: Negative.   ?Skin: Negative.   ?Neurological: Negative.   ?Psychiatric/Behavioral:  Positive for depression and substance abuse. The patient is nervous/anxious.   ?Blood pressure 111/83, pulse 84, temperature 98.4 ?F (36.9 ?C), temperature source Oral, resp. rate 18, SpO2 97 %. There is no height or weight on file to calculate BMI. ? ?Musculoskeletal: ?Strength & Muscle Tone: within  normal limits ?Gait & Station: normal ?Patient leans: N/A ? ? ?[facility] MSE Discharge Disposition for Follow up and Recommendations: ?Based on my evaluation the patient does not appear to have an emergency medical condition and can be discharged with resources and follow up care in outpatient services for Medication Management and Individual Therapy ? ?Discharge patient ? ?7-day sample of Seroquel 100 mg nightly and metformin 500 mg twice daily.  He was provided with a printed prescription for 30-day supply.Educated patient on open access walk-in hours for [facility] behavioral health services on the second floor. ? ?Nursing staff provided AVS including samples.  After patient was discharged AVS was found in the lobby including the printed prescriptions.  Patient only took the 7-day supply of medications.   ?Provided substance abuse treatment resources. ?No evidence of imminent risk to self or others at present.    ?Patient does not meet criteria for psychiatric inpatient admission. ?Discussed crisis plan, support from social network, calling 911, coming to the Emergency Department,  and calling Suicide Hotline.  ? ?Ardis Hughsarolyn H Raju Coppolino, NP ?11/15/2021, 11:33 AM ? ?
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
## 2021-11-15 NOTE — BH Assessment (Addendum)
Per note by [name], NP at [facility]: "Pt to be transferred to [facility], handoff given to Dr. Particia Nearing. Pt may return to [facility] if medically stable for the next 24 hours." ? ?Marzetta Board, [facility] at [facility], states there is no longer a bed available at [facility].  ? ?TTS will assess Pt to determine if Pt meets criteria for inpatient psychiatric treatment. ? ? ?Harlin Rain Patsy [place], [facility], N[facility] ?Triage Specialist ?[phone] ? ?

## 2021-11-15 NOTE — ED Notes (Signed)
Pt discharged with  AVS.  AVS reviewed prior to discharge.  Pt alert, oriented, and ambulatory.  Safety maintained.  °

## 2021-11-15 NOTE — Discharge Instructions (Addendum)
Patient is instructed prior to discharge to: Take all medications as prescribed by his/her mental healthcare provider. ?Report any adverse effects and or reactions from the medicines to his/her outpatient provider promptly. ?Patient has been instructed & cautioned: To not engage in alcohol and or illegal drug use while on prescription medicines. ?In the event of worsening symptoms, patient is instructed to call the crisis hotline, 911 and or go to the nearest ED for appropriate evaluation and treatment of symptoms. ?To follow-up with his/her primary care provider for your other medical issues, concerns and or health care needs. ?Substance Abuse Treatment Programs ? ?Intensive Outpatient Programs ?[facility]     ?[address]      ?[place], [place]                   ?[phone]      ? ?[facility] ?[address] ?[place], [place] ?[phone] ? ?[facility]     ?(Inpatient and outpatient)     ?[address]           ?[phone]   ? ?[facility] ?[phone] (Suboxone and Methadone) ? ?[address]      ?[place], [place] [address]      ?[phone]      ? ?[address] ?[place], [place] ?[phone] ? ?[facility] (Outpatient/Inpatient, [facility])    ?(insurance only) [phone]      ?       ?[facility] (Groups & Residential) ?[place], [place] ?[phone] ? ?   ?[facility]     ?[address]     ?[place], [place]      ?[phone]      ? ?[facility] (for caregivers and family) ?[address] ?[place], [place] ?[phone] ? ? ? ? ? ?Residential Treatment Programs ?[facility]      ?[address], [place], [place] [address]  ?[phone]      ? ?[facility] ?[address] [place], [place] [address] ?[phone] ? ?[facility]        ?[phone]      ? ?[facility] ?[phone] ? ?[facility]             ?[address]                                          ?[place], [place]                                                ?[phone] or [phone]                              ? ?[facility] [place] ?[address] ?[place], [address] ?[phone] ? ?[facility]    ?[address]      ?[place] , [place]     ?[phone]      ? ?[facility] ?[address] ?[place], [place] ?[phone] ? ?[facility]   ?[address]     ?[place],  [address]     ?[phone]      ?  Admissions: 8am-3pm M-F ? ?[facility] ([facility]) ?[address] ?[place], [place] ?[phone] ? ?[facility]: Residential Program (90 Days)   ?[place], [place]      ?[phone] or [phone]    ? ?[facility] ?[place], [place] ?(Walk in Hours over the weekend or by referral) ? ?[place] [facility] ?[address], [place], [place] [address] ?[phone] ? ?Crisis Mobile: [facility]:  1-877-626-1772 (for crisis response 24 hours a day) ?[facility] Hotline:      1-800-256-2452 ?Outpatient Psychiatry and Counseling ? ?[facility]: Mobile Crisis Management 24 hours:  1-877-626-1772 ? ?[facility] sliding scale fee and walk in schedule: M-F 8am-12pm/1pm-3pm ?[address]  ?[place], [place] [address] ?[phone] ? ?[facility] ?[address] ?[place], [place] [address] ?[phone] ? ?[facility] ([facility])- new patient walk-in appointments available Monday - Friday 8am -3pm.          ?[address] ?[place], [place] [address] ?[phone] or crisis line- [phone] ? ?[facility]/ Intensive Outpatient Therapy Program ?[address] ?[place], [place] [address] ?[phone] ? ?[facility]                  ?Crisis Services      ?[phone]      ?[address]     ?[place], [place]  [address]                ? ?[facility]   ?[facility] ?[phone] ?[address] ?[place], [place] [address] ? ? ?[facility]          ?[address],  ?[place], [place] [address]       ?[phone] ? ?[facility] ?[address] ?[place], [place] [address] ?[phone] ? ?[facility]    ?[address]    ?[place], [place] [address]     ?[phone]      ? ?Parish McKinney, MD     ?[address]     ?[place] [address]     ?[phone]     ?  ?[facility] ?[address] ?[place] [address] ? ?[facility]     ?[address]     ?[place], [place]      ?[phone]      ? ?[facility] ?Shamsher Ahluwalia, MD ?[address] ?[place], [place] [address] ?[phone] ? ?[facility]     ?[address]     ?[place], [place] [address]     ?[phone]      ? ?[facility] ?[address] ?[place], [place] [address] ?[phone] ?Resources for Temporary Residential Assistance/Crisis Centers ? ?DAY CENTERS ?[facility] ?M-F 8am-3pm   ?[address] [place], [place] [address]   [phone] ?Services include: laundry, barbering, support groups, case management, phone  & computer access, showers, AA/NA mtgs, mental health/substance abuse nurse, job skills class, disability information, VA assistance, spiritual classes, etc.  ? ?HOMELESS SHELTERS ? ?[facility]     ?[facility]   ?[address], [place]     ?[phone]       ?       ?[facility] (women and children)       ?[address] ?,    [address] ?[phone] ?[email or web address] for application and process ?Application Required ? ?[facility]   ?[address]    ?[place] [address]     ?[phone]       ?             ?[facility] ?[address] ?[place], [place] [address] ?[phone] ?[phone](schedule application appt.) ?Application Required ? ?[facility] (women only)    ?[address]     ?[place], [place] [address]     ?[phone]      ?Intake starts 6pm daily ?Need valid ID, SSC, & Police report ?[facility] [place] ?[address] ?[place], [place] ?[phone] ?Application Required ? ?[facility] (men only)     ?[address]      ?[place], [place]     ?[phone]      ? ?[facility] ?(Pregnant women only) ?[address] ?[place], [place] ?[phone] ? ?[facility]      ?[address]      ?[place], [place] [address]     ?[phone]      ?       ?[place] [facility] ?[address] ?[place], [place] ?[phone] ?90 day commitment/SA/Application process ? ?[facility](men only)     ?[address]     ?[place], [place]     ?[phone]       ?Check-in at 7pm     ?       ?[facility] ?[address] ?[place], [place] [address] ?[phone] ?Men/Women/Women and Children must be there by 7 pm ? ?[facility] ?[place],  ?[phone]                ? ?   ?
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
## 2021-11-15 NOTE — Discharge Instructions (Addendum)
For your mental health needs, you are advised to continue treatment with [facility].  Contact them at your earliest opportunity: ? ?     [facility] ?     [address] ?     [place], [place] [address] ?     [phone] ? ? ?It was a pleasure caring for you today in the emergency department. ? ?Please return to the emergency department for any worsening or worrisome symptoms. ? ?

## 2021-11-15 NOTE — ED Triage Notes (Addendum)
Pt arrived via EMS, went to [facility] earlier today due to hallucinations, denies SI/HI was requesting help for drug rehab. [facility] called EMS for transport to ED for medical clearance, at the time pt had refused transport. Pt requesting ED and transported at this time.  ?

## 2021-11-16 DIAGNOSIS — F332 Major depressive disorder, recurrent severe without psychotic features: Secondary | ICD-10-CM

## 2021-11-16 LAB — CBC WITH DIFFERENTIAL/PLATELET
Abs Immature Granulocytes: 0 10*3/uL (ref 0.00–0.07)
Basophils Absolute: 0 10*3/uL (ref 0.0–0.1)
Basophils Relative: 1 %
Eosinophils Absolute: 0.1 10*3/uL (ref 0.0–0.5)
Eosinophils Relative: 1 %
HCT: 37.1 % — ABNORMAL LOW (ref 39.0–52.0)
Hemoglobin: 13 g/dL (ref 13.0–17.0)
Immature Granulocytes: 0 %
Lymphocytes Relative: 43 %
Lymphs Abs: 1.8 10*3/uL (ref 0.7–4.0)
MCH: 31.8 pg (ref 26.0–34.0)
MCHC: 35 g/dL (ref 30.0–36.0)
MCV: 90.7 fL (ref 80.0–100.0)
Monocytes Absolute: 0.4 10*3/uL (ref 0.1–1.0)
Monocytes Relative: 9 %
Neutro Abs: 1.9 10*3/uL (ref 1.7–7.7)
Neutrophils Relative %: 46 %
Platelets: 193 10*3/uL (ref 150–400)
RBC: 4.09 MIL/uL — ABNORMAL LOW (ref 4.22–5.81)
RDW: 13.6 % (ref 11.5–15.5)
WBC: 4.1 10*3/uL (ref 4.0–10.5)
nRBC: 0 % (ref 0.0–0.2)

## 2021-11-16 LAB — SALICYLATE LEVEL: Salicylate Lvl: <7 mg/dL — ABNORMAL LOW (ref 7.0–30.0)

## 2021-11-16 LAB — RESP PANEL BY RT-PCR (FLU A&B, COVID) ARPGX2
Influenza A by PCR: NEGATIVE
Influenza B by PCR: NEGATIVE
SARS Coronavirus 2 by RT PCR: NEGATIVE

## 2021-11-16 LAB — RAPID URINE DRUG SCREEN, HOSP PERFORMED
Amphetamines: NOT DETECTED
Barbiturates: NOT DETECTED
Benzodiazepines: NOT DETECTED
Cocaine: POSITIVE — AB
Opiates: NOT DETECTED
Tetrahydrocannabinol: POSITIVE — AB

## 2021-11-16 LAB — COMPREHENSIVE METABOLIC PANEL
ALT: 51 U/L — ABNORMAL HIGH (ref 0–44)
AST: 72 U/L — ABNORMAL HIGH (ref 15–41)
Albumin: 3.8 g/dL (ref 3.5–5.0)
Alkaline Phosphatase: 67 U/L (ref 38–126)
Anion gap: 9 (ref 5–15)
BUN: 19 mg/dL (ref 6–20)
CO2: 24 mmol/L (ref 22–32)
Calcium: 9.1 mg/dL (ref 8.9–10.3)
Chloride: 101 mmol/L (ref 98–111)
Creatinine, Ser: 0.83 mg/dL (ref 0.61–1.24)
GFR, Estimated: >60 mL/min (ref >60)
Glucose, Bld: 165 mg/dL — ABNORMAL HIGH (ref 70–99)
Potassium: 3.6 mmol/L (ref 3.5–5.1)
Sodium: 134 mmol/L — ABNORMAL LOW (ref 135–145)
Total Bilirubin: 1.2 mg/dL (ref 0.3–1.2)
Total Protein: 8.6 g/dL — ABNORMAL HIGH (ref 6.5–8.1)

## 2021-11-16 LAB — ETHANOL: Alcohol, Ethyl (B): <10 mg/dL (ref <10)

## 2021-11-16 LAB — ACETAMINOPHEN LEVEL: Acetaminophen (Tylenol), Serum: <10 ug/mL — ABNORMAL LOW (ref 10–30)

## 2021-11-16 LAB — CBG MONITORING, ED: Glucose-Capillary: 115 mg/dL — ABNORMAL HIGH (ref 70–99)

## 2021-11-16 LAB — HEMOGLOBIN A1C
Hgb A1c MFr Bld: 7 % — ABNORMAL HIGH (ref 4.8–5.6)
Mean Plasma Glucose: 154.2 mg/dL

## 2021-11-16 MED ORDER — LORAZEPAM 2 MG/ML IJ SOLN: 0.0000 mg | Freq: Four times a day (QID) | INTRAMUSCULAR | Status: DC

## 2021-11-16 MED ORDER — KETOROLAC TROMETHAMINE 30 MG/ML IJ SOLN
INTRAMUSCULAR | Status: AC
  Filled 2021-11-16: qty 1

## 2021-11-16 MED ORDER — BUPIVACAINE-EPINEPHRINE (PF) 0.25% -1:200000 IJ SOLN
INTRAMUSCULAR | Status: AC
  Filled 2021-11-16: qty 30

## 2021-11-16 MED ORDER — DOLUTEGRAVIR SODIUM 50 MG PO TABS
50.0000 mg | ORAL_TABLET | Freq: Every day | ORAL | Status: DC
  Administered 2021-11-16: 50 mg via ORAL
  Filled 2021-11-16: qty 1

## 2021-11-16 MED ORDER — EMTRICITABINE-TENOFOVIR AF 200-25 MG PO TABS
1.0000 | ORAL_TABLET | Freq: Every day | ORAL | Status: DC
  Administered 2021-11-16: 1 via ORAL
  Filled 2021-11-16: qty 1

## 2021-11-16 MED ORDER — LORAZEPAM 2 MG/ML IJ SOLN: 0.0000 mg | Freq: Two times a day (BID) | INTRAMUSCULAR | Status: DC

## 2021-11-16 MED ORDER — METFORMIN HCL 500 MG PO TABS
500.0000 mg | ORAL_TABLET | Freq: Two times a day (BID) | ORAL | Status: DC
  Administered 2021-11-16: 500 mg via ORAL
  Filled 2021-11-16: qty 1

## 2021-11-16 MED ORDER — LORAZEPAM 1 MG PO TABS
0.0000 mg | ORAL_TABLET | Freq: Four times a day (QID) | ORAL | Status: DC
  Administered 2021-11-16: 1 mg via ORAL
  Filled 2021-11-16: qty 1

## 2021-11-16 MED ORDER — QUETIAPINE FUMARATE 100 MG PO TABS: 100.0000 mg | ORAL_TABLET | Freq: Every day | ORAL | Status: DC

## 2021-11-16 MED ORDER — SODIUM CHLORIDE (PF) 0.9 % IJ SOLN
INTRAMUSCULAR | Status: AC
  Filled 2021-11-16: qty 30

## 2021-11-16 MED ORDER — THIAMINE HCL 100 MG/ML IJ SOLN
100.0000 mg | Freq: Every day | INTRAMUSCULAR | Status: DC
Start: 2021-11-16 — End: 2021-11-16

## 2021-11-16 MED ORDER — INSULIN GLARGINE-YFGN 100 UNIT/ML ~~LOC~~ SOLN
45.0000 [IU] | Freq: Every day | SUBCUTANEOUS | Status: DC
  Filled 2021-11-16: qty 0.45

## 2021-11-16 MED ORDER — THIAMINE HCL 100 MG PO TABS: 100.0000 mg | ORAL_TABLET | Freq: Every day | ORAL | Status: DC

## 2021-11-16 MED ORDER — LORAZEPAM 1 MG PO TABS: 0.0000 mg | ORAL_TABLET | Freq: Two times a day (BID) | ORAL | Status: DC

## 2021-11-16 MED ORDER — INSULIN GLARGINE 100 UNIT/ML SOLOSTAR PEN: 45.0000 [IU] | PEN_INJECTOR | Freq: Every day | SUBCUTANEOUS | Status: DC

## 2021-11-16 NOTE — BH Assessment (Signed)
[facility] Assessment Progress Note ?  ?Per Hillery Jacks, NP, this voluntary pt does not require psychiatric hospitalization at this time.  Pt is psychiatrically cleared.  Discharge instructions advise pt to continue treatment with [facility] at his earliest opportunity.  EDP Tanda Rockers, DO and pt's nurse, Kennyth Arnold, have been notified. ? ?Doylene Canning, MA ?Triage Specialist ?[phone] ? ?

## 2021-11-16 NOTE — ED Provider Notes (Signed)
Emergency Medicine Observation Re-evaluation Note ? ?Derrick Cooper is a 61 y.o. male, seen on rounds today.  Pt initially presented to the ED for complaints of Medical Clearance ?Currently, the patient is resting. ? ?Physical Exam  ?BP 133/90   Pulse (!) 108   Temp 98.6 ?F (37 ?C) (Oral)   Resp 18   SpO2 99%  ?Physical Exam ?General: resting ?Cardiac: rrr ?Lungs: equal chest rise b/l ?Psych: calm ? ?ED Course / MDM  ?EKG:EKG Interpretation ? ?Date/Time:  Tuesday November 16 2021 01:12:27 EDT ?Ventricular Rate:  103 ?PR Interval:  141 ?QRS Duration: 87 ?QT Interval:  344 ?QTC Calculation: 451 ?R Axis:   68 ?Text Interpretation: Sinus tachycardia Minimal ST depression, inferior leads Confirmed by Randal Buba, April ([id number]) on 11/16/2021 1:19:21 AM ? ?I have reviewed the labs performed to date as well as medications administered while in observation.  Recent changes in the last 24 hours include pending placement. Home meds ordered.  ? ?Plan  ?Current plan is for placement. ? KINO DINGELDEIN is not under involuntary commitment. ? ? ?  ?Jeanell Sparrow, DO ?11/16/21 0749 ? ?

## 2021-11-16 NOTE — Progress Notes (Signed)
Inpatient Behavioral Health Placement ? ?Pt meets inpatient criteria per Lindon Romp, NP. There are no appropriate beds at [facility] per [facility] Wynonia Hazard, RN.  Referral was sent to the following facilities;  ? ?Destination ?Service Provider Address Phone Fax  ?[facility]  [address] [place] [address] [phone]  ?[place] [place] [address] [phone]  ?[place] [address], [place] [address] [phone]  ?[facility]  [address] [place] [address] [phone]  ?[facility]  [address] [place] [address] [phone]  ?[place], [place] [id number] [phone]  ?[facility]  [address], [place] [address] [phone]  ?[facility] [address] [place] [address] [phone]  ?[place]  [place], [place] [address] [phone]  ?[address] [place] [address] [phone]  ?[facility]  [address], [place] [address] [phone]  ?[place]  [address] [place] [address] [id number]  ?[facility]  [address] [place] [address] [phone]  ?[facility]  [address] [place] [address] [phone]  ?[place]  [address] [place] [address] [phone]  ?[place] [address], [place] [address] [phone]  ?[facility]  [address]  [place] [address] [phone]  ?[facility]  [place], [place] [address] [phone]  ?[facility]  [place], [place] [address] [phone]  ?[facility]  [address] [place] [address]    ? ? ?Situation ongoing,  CSW will follow up. ? ? ?Benjaman Kindler, MSW, LCSWA ?11/16/2021  @ 1:10 AM ? ?
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
## 2021-11-16 NOTE — ED Notes (Signed)
I provided reinforced discharge education based off of discharge instructions. Pt acknowledged and understood my education. Pt had no further questions/concerns for provider/myself.  °

## 2021-11-16 NOTE — BH Assessment (Signed)
Derrick Cooper, [facility], says currently there is no available bed for this patient. ? ? ?

## 2021-11-16 NOTE — Consult Note (Addendum)
[facility] ED Discharge ? ?11/16/2021 10:58 AM ?Derrick Cooper  ?MRN:  [id number] ? ?Method of visit?: Face to Face  ? ?Principal Problem: MDD (major depressive disorder), recurrent episode, severe ([place]) ?Discharge Diagnoses: Principal Problem: ?  MDD (major depressive disorder), recurrent episode, severe ([place]) ?Active Problems: ?  Homelessness ? ? ?Subjective:  Derrick Cooper was seen and evaluated face-to-face.  He reports he has not had a Seroquel since last night.  States he is prescribed  Seroquel 400 mg daily.  Patient was recently discharged from [facility] urgent care facility with medication samples.  He reports he is supposed to follow-up with [facility] services however does not have transportation to that facility. Patient is requesting a bus pass. NP will follow-up with CSW for bus pass ? ?UDS positive for cocaine and marijuana.  We will make additional outpatient resources for substance abuse treatment available.  Case staffed with attending psychiatrist MD Dwyane Dee.  Support, encouragement and reassurance was provided. ? ? ?Per initial assessment note: Pt is a 61 year old male who presents unaccompanied to [facility] ED reporting suicidal ideation with a plan to lie on train tracks. He has a diagnosis of schizoaffective disorder, bipolar type and says he is experiencing auditory hallucinations tell him to kill himself. He reports thoughts of harming his sister, who lives in [place], [place], with no plan or intent, adding that she kicked him out of her residence. He says he has visual hallucinations at night of "seeing things moving." Pt is using alcohol, cocaine, and methamphetamines.  ? ? ?Total Time spent with patient: 15 minutes ? ?Past Psychiatric History: ? ?Past Medical History:  ?Past Medical History:  ?Diagnosis Date  ? Diabetes mellitus without complication ([place])   ? Hepatitis C   ? HIV infection ([place])   ? Schizophrenia ([place])   ? Substance abuse ([place])   ?  ?Past Surgical History:  ?Procedure  Laterality Date  ? ORIF MANDIBULAR FRACTURE Left 12/31/2019  ? Procedure: OPEN REDUCTION INTERNAL FIXATION (ORIF) MANDIBULAR FRACTURE , NASAL  MAXILLO MANDIBULAR FIXATION;  Surgeon: Marcina Millard, MD;  Location: [facility];  Service: ENT;  Laterality: Left;  ? ?Family History:  ?Family History  ?Problem Relation Age of Onset  ? Breast cancer Mother   ? Lung cancer Father   ? Heart attack Father   ? ?Family Psychiatric  History:  ?Social History:  ?Social History  ? ?Substance and Sexual Activity  ?Alcohol Use Yes  ? Comment: Clean for 2 months   ?   ?Social History  ? ?Substance and Sexual Activity  ?Drug Use Yes  ? Types: Marijuana, Methamphetamines, "Crack" cocaine  ? Comment: Has used crack in the last month; marajuana  occasionally  ?  ?Social History  ? ?Socioeconomic History  ? Marital status: Single  ?  Spouse name: Not on file  ? Number of children: 0  ? Years of education: 6  ? Highest education level: Not on file  ?Occupational History  ? Occupation: unemployed  ?Tobacco Use  ? Smoking status: Every Day  ?  Packs/day: 0.50  ?  Years: 42.00  ?  Pack years: 21.00  ?  Types: Cigarettes  ? Smokeless tobacco: Never  ?Vaping Use  ? Vaping Use: Never used  ?Substance and Sexual Activity  ? Alcohol use: Yes  ?  Comment: Clean for 2 months   ? Drug use: Yes  ?  Types: Marijuana, Methamphetamines, "Crack" cocaine  ?  Comment: Has used crack in the last month;  marajuana  occasionally  ? Sexual activity: Not Currently  ?  Partners: Female  ?  Comment: declined condoms 08/27/20  ?Other Topics Concern  ? Not on file  ?Social History Narrative  ? Not on file  ? ?Social Determinants of Health  ? ?Financial Resource Strain: Not on file  ?Food Insecurity: Not on file  ?Transportation Needs: Not on file  ?Physical Activity: Not on file  ?Stress: Not on file  ?Social Connections: Not on file  ? ? ?Tobacco Cessation:  N/A, patient does not currently use tobacco products ? ?Current Medications: ?Current Facility-Administered  Medications  ?Medication Dose Route Frequency Provider Last Rate Last Admin  ? dolutegravir (TIVICAY) tablet 50 mg  50 mg Oral Daily Benedetto Goad N, PA-C      ? emtricitabine-tenofovir AF (DESCOVY) 200-25 MG per tablet 1 tablet  1 tablet Oral Daily Benedetto Goad N, PA-C      ? insulin glargine-yfgn [facility]) injection 45 Units  45 Units Subcutaneous QHS Jacqlyn Larsen, PA-C      ? LORazepam (ATIVAN) injection 0-4 mg  0-4 mg Intravenous Q6H Jacqlyn Larsen, PA-C      ? Or  ? LORazepam (ATIVAN) tablet 0-4 mg  0-4 mg Oral Q6H Benedetto Goad N, PA-C   1 mg at 11/16/21 1023  ? [START ON 11/18/2021] LORazepam (ATIVAN) injection 0-4 mg  0-4 mg Intravenous Q12H Jacqlyn Larsen, PA-C      ? Or  ? [START ON 11/18/2021] LORazepam (ATIVAN) tablet 0-4 mg  0-4 mg Oral Q12H Benedetto Goad N, PA-C      ? metFORMIN (GLUCOPHAGE) tablet 500 mg  500 mg Oral BID WC Benedetto Goad N, PA-C   500 mg at 11/16/21 4196  ? QUEtiapine (SEROQUEL) tablet 100 mg  100 mg Oral QHS Jacqlyn Larsen, [place]      ? ?Current Outpatient Medications  ?Medication Sig Dispense Refill  ? metFORMIN (GLUCOPHAGE) 500 MG tablet Take 1 tablet (500 mg total) by mouth 2 (two) times daily with a meal. 60 tablet 0  ? QUEtiapine (SEROQUEL) 100 MG tablet Take 1 tablet (100 mg total) by mouth at bedtime. 30 tablet 0  ? Accu-Chek Softclix Lancets lancets Use as directed up to 4 times daily 100 each 5  ? Blood Glucose Monitoring Suppl (ACCU-CHEK GUIDE) w/Device KIT Use as directed 1 kit 0  ? dolutegravir (TIVICAY) 50 MG tablet Take 1 tablet (50 mg total) by mouth daily. (Patient not taking: Reported on 11/15/2021) 30 tablet 2  ? emtricitabine-tenofovir AF (DESCOVY) 200-25 MG tablet Take 1 tablet by mouth daily. (Patient not taking: Reported on 11/15/2021) 30 tablet 2  ? glucose (B-D GLUCOSE) 5 g chewable tablet Chew 3 tablets (15 g total) by mouth as needed for low blood sugar. (Patient not taking: Reported on 11/15/2021) 30 tablet 3  ? glucose blood (ACCU-CHEK GUIDE) test strip Use as  instructed up to 4 times daily 100 each 12  ? insulin aspart (NOVOLOG) 100 UNIT/ML FlexPen Inject 20 Units into the skin 3 (three) times daily with meals. (Patient not taking: Reported on 11/15/2021) 18 mL 11  ? insulin glargine (LANTUS) 100 UNIT/ML Solostar Pen Inject 45 Units into the skin at bedtime. (Patient not taking: Reported on 11/15/2021) 30 mL 11  ? Insulin Pen Needle (PENTIPS) 32G X 4 MM MISC Use as directed 100 each 5  ? sulfamethoxazole-trimethoprim (BACTRIM DS) 800-160 MG tablet Take 1 tablet by mouth daily. (Patient not taking: Reported on 11/15/2021) 30 tablet 2  ? ?  PTA Medications: ?(Not in a hospital admission) ? ? ?Musculoskeletal: ?Strength & Muscle Tone: within normal limits ?Gait & Station: normal ?Patient leans: N/A ? ?Psychiatric Specialty Exam: ? ?Presentation  ?General Appearance: Disheveled ? ?Eye Contact:Good ? ?Speech:Clear and Coherent; Normal Rate ? ?Speech Volume:Normal ? ?Handedness:Right ? ? ?Mood and Affect  ?Mood:Depressed; Anxious ? ?Affect:Congruent ? ? ?Thought Process  ?Thought Processes:Coherent ? ?Descriptions of Associations:Intact ? ?Orientation:Full (Time, Place and Person) ? ?Thought Content:Logical ? ?History of Schizophrenia/Schizoaffective disorder:Yes ? ?Duration of Psychotic Symptoms:Greater than six months ? ?Hallucinations:Hallucinations: None ? ?Ideas of Reference:None ? ?Suicidal Thoughts:Suicidal Thoughts: No ? ?Homicidal Thoughts:Homicidal Thoughts: No ? ? ?Sensorium  ?Memory:Immediate Good; Recent Good; Remote Good ? ?Judgment:Fair ? ?Insight:Fair ? ? ?Executive Functions  ?Concentration:Good ? ?Attention Span:Good ? ?Recall:Good ? ?Fund of [place] ? ?Language:Good ? ? ?Psychomotor Activity  ?Psychomotor Activity:Psychomotor Activity: Restlessness ? ? ?Assets  ?Assets:Communication Skills; Desire for Improvement; Physical Health; Resilience ? ? ?Sleep  ?Sleep:Sleep: Fair ? ? ? ?Physical Exam: ?Physical Exam ?Vitals reviewed.  ?Cardiovascular:  ?   Rate  and Rhythm: Normal rate.  ?Neurological:  ?   Mental Status: He is alert.  ?Psychiatric:     ?   Mood and Affect: Mood normal.     ?   Thought Content: Thought content normal.  ? ?Review of Systems  ?Vincent Peyer

## 2021-11-16 NOTE — ED Notes (Signed)
Pt changed into burgundy scrub top and paper scrub pants. Pt belongings in white pt belongings bag along with sleeping bag in trash bag in cabinet behind triage ?

## 2021-11-24 ENCOUNTER — Other Ambulatory Visit: Payer: Self-pay

## 2021-11-24 ENCOUNTER — Encounter (HOSPITAL_COMMUNITY): Payer: Self-pay | Admitting: Pharmacy Technician

## 2021-11-24 ENCOUNTER — Emergency Department (HOSPITAL_COMMUNITY)
Admission: EM | Admit: 2021-11-24 | Discharge: 2021-11-25 | Disposition: A | Discharge status: Home / Self Care | Payer: Medicaid Other | Attending: Emergency Medicine | Admitting: Emergency Medicine

## 2021-11-24 DIAGNOSIS — Z794 Long term (current) use of insulin: Secondary | ICD-10-CM | POA: Diagnosis not present

## 2021-11-24 DIAGNOSIS — Z20822 Contact with and (suspected) exposure to covid-19: Secondary | ICD-10-CM | POA: Diagnosis not present

## 2021-11-24 DIAGNOSIS — R451 Restlessness and agitation: Secondary | ICD-10-CM

## 2021-11-24 DIAGNOSIS — R443 Hallucinations, unspecified: Secondary | ICD-10-CM | POA: Insufficient documentation

## 2021-11-24 DIAGNOSIS — E119 Type 2 diabetes mellitus without complications: Secondary | ICD-10-CM | POA: Insufficient documentation

## 2021-11-24 DIAGNOSIS — F03911 Unspecified dementia, unspecified severity, with agitation: Secondary | ICD-10-CM | POA: Diagnosis present

## 2021-11-24 LAB — CBC WITH DIFFERENTIAL/PLATELET
Abs Immature Granulocytes: 0.01 10*3/uL (ref 0.00–0.07)
Basophils Absolute: 0 10*3/uL (ref 0.0–0.1)
Basophils Relative: 0 %
Eosinophils Absolute: 0 10*3/uL (ref 0.0–0.5)
Eosinophils Relative: 1 %
HCT: 38.4 % — ABNORMAL LOW (ref 39.0–52.0)
Hemoglobin: 13.3 g/dL (ref 13.0–17.0)
Immature Granulocytes: 0 %
Lymphocytes Relative: 55 %
Lymphs Abs: 2.8 10*3/uL (ref 0.7–4.0)
MCH: 31.6 pg (ref 26.0–34.0)
MCHC: 34.6 g/dL (ref 30.0–36.0)
MCV: 91.2 fL (ref 80.0–100.0)
Monocytes Absolute: 0.5 10*3/uL (ref 0.1–1.0)
Monocytes Relative: 10 %
Neutro Abs: 1.8 10*3/uL (ref 1.7–7.7)
Neutrophils Relative %: 34 %
Platelets: 107 10*3/uL — ABNORMAL LOW (ref 150–400)
RBC: 4.21 MIL/uL — ABNORMAL LOW (ref 4.22–5.81)
RDW: 13.5 % (ref 11.5–15.5)
Smear Review: DECREASED
WBC: 5.2 10*3/uL (ref 4.0–10.5)
nRBC: 0 % (ref 0.0–0.2)

## 2021-11-24 LAB — RESP PANEL BY RT-PCR (FLU A&B, COVID) ARPGX2
Influenza A by PCR: NEGATIVE
Influenza B by PCR: NEGATIVE
SARS Coronavirus 2 by RT PCR: NEGATIVE

## 2021-11-24 LAB — ACETAMINOPHEN LEVEL: Acetaminophen (Tylenol), Serum: <10 ug/mL — ABNORMAL LOW (ref 10–30)

## 2021-11-24 LAB — BASIC METABOLIC PANEL
Anion gap: 9 (ref 5–15)
BUN: 16 mg/dL (ref 6–20)
CO2: 25 mmol/L (ref 22–32)
Calcium: 8.7 mg/dL — ABNORMAL LOW (ref 8.9–10.3)
Chloride: 97 mmol/L — ABNORMAL LOW (ref 98–111)
Creatinine, Ser: 0.83 mg/dL (ref 0.61–1.24)
GFR, Estimated: >60 mL/min (ref >60)
Glucose, Bld: 93 mg/dL (ref 70–99)
Potassium: 3.4 mmol/L — ABNORMAL LOW (ref 3.5–5.1)
Sodium: 131 mmol/L — ABNORMAL LOW (ref 135–145)

## 2021-11-24 LAB — SALICYLATE LEVEL: Salicylate Lvl: <7 mg/dL — ABNORMAL LOW (ref 7.0–30.0)

## 2021-11-24 LAB — ETHANOL: Alcohol, Ethyl (B): 318 mg/dL (ref <10)

## 2021-11-24 MED ORDER — ZIPRASIDONE MESYLATE 20 MG IM SOLR
INTRAMUSCULAR | Status: AC
  Administered 2021-11-24: 20 mg via INTRAMUSCULAR
  Filled 2021-11-24: qty 20

## 2021-11-24 MED ORDER — ZIPRASIDONE MESYLATE 20 MG IM SOLR
20.0000 mg | Freq: Once | INTRAMUSCULAR | Status: AC
Start: 2021-11-24 — End: 2021-11-24

## 2021-11-24 MED ORDER — STERILE WATER FOR INJECTION IJ SOLN
INTRAMUSCULAR | Status: AC
  Filled 2021-11-24: qty 10

## 2021-11-24 NOTE — ED Notes (Signed)
Pt is starting to rouse and become more alert and lucid. He is following commands. Had large soft stool and urine soaked brief. Pt washed and changed into burgandy scrubs. Warm blankets given. He placed blanket over his head and has gone back to sleep  ?

## 2021-11-24 NOTE — ED Triage Notes (Signed)
Pt here via ems with hallucinations. Pt seeing "blue and red demons". States they are out to kill him. Pt combative intermittently. IVC papers being initiated by [employer].  ?

## 2021-11-24 NOTE — ED Provider Notes (Signed)
?[facility] DEPT ?Provider Note ? ? ?CSN: [id number] ?Arrival date & time: 11/24/21  1711 ? ?  ? ?History ? ?Chief Complaint  ?Patient presents with  ? Manic Behavior  ? ? ?Derrick Cooper is a 61 y.o. male. ? ?61 year old male with prior medical history as detailed below presents for evaluation.  Patient arrives with IVC order initiated by law enforcement.  Patient with aggressive and erratic behavior reportedly enforcement and EMS.  Patient is actively responding to nonpresent external stimuli. ? ?Patient reports that he is "seeing demons." ? ?Patient is intermittently agitated and combative. ? ?Chemical sedation of ordered to control his behavior and keep staff safe. ? ?The history is provided by the patient and medical records.  ?Illness ?Location:  Agitation, erratic behavior, hallucinations ?Severity:  Moderate ?Onset quality:  Unable to specify ?Timing:  Unable to specify ?Progression:  Unable to specify ? ?  ? ?Home Medications ?Prior to Admission medications   ?Medication Sig Start Date End Date Taking? Authorizing Provider  ?Accu-Chek Softclix Lancets lancets Use as directed up to 4 times daily 02/12/21   Madalyn Rob, MD  ?Blood Glucose Monitoring Suppl (ACCU-CHEK GUIDE) w/Device KIT Use as directed 02/12/21   Madalyn Rob, MD  ?dolutegravir (TIVICAY) 50 MG tablet Take 1 tablet (50 mg total) by mouth daily. ?Patient not taking: Reported on 11/15/2021 02/08/21   Golden Circle, FNP  ?emtricitabine-tenofovir AF (DESCOVY) 200-25 MG tablet Take 1 tablet by mouth daily. ?Patient not taking: Reported on 11/15/2021 02/08/21   Golden Circle, FNP  ?glucose (B-D GLUCOSE) 5 g chewable tablet Chew 3 tablets (15 g total) by mouth as needed for low blood sugar. ?Patient not taking: Reported on 11/15/2021 02/18/21 02/18/22  Farrel Gordon, DO  ?glucose blood (ACCU-CHEK GUIDE) test strip Use as instructed up to 4 times daily 02/12/21   Madalyn Rob, MD  ?insulin aspart (NOVOLOG) 100 UNIT/ML FlexPen  Inject 20 Units into the skin 3 (three) times daily with meals. ?Patient not taking: Reported on 11/15/2021 02/15/21   Rehman, Areeg N, DO  ?insulin glargine (LANTUS) 100 UNIT/ML Solostar Pen Inject 45 Units into the skin at bedtime. ?Patient not taking: Reported on 11/15/2021 02/15/21   Mike Craze, DO  ?Insulin Pen Needle (PENTIPS) 32G X 4 MM MISC Use as directed 02/12/21   Madalyn Rob, MD  ?metFORMIN (GLUCOPHAGE) 500 MG tablet Take 1 tablet (500 mg total) by mouth 2 (two) times daily with a meal. 11/15/21   Revonda Humphrey, NP  ?QUEtiapine (SEROQUEL) 100 MG tablet Take 1 tablet (100 mg total) by mouth at bedtime. 11/15/21   Revonda Humphrey, NP  ?sulfamethoxazole-trimethoprim (BACTRIM DS) 800-160 MG tablet Take 1 tablet by mouth daily. ?Patient not taking: Reported on 11/15/2021 02/08/21   Golden Circle, FNP  ?   ? ?Allergies    ?Patient has no known allergies.   ? ?Review of Systems   ?Review of Systems  ?Unable to perform ROS: Acuity of condition  ? ?Physical Exam ?Updated Vital Signs ?BP 103/66   Pulse (!) 105   Temp 97.9 ?F (36.6 ?C) (Axillary)   Resp 16   SpO2 91%  ?Physical Exam ?Vitals and nursing note reviewed.  ?Constitutional:   ?   General: He is not in acute distress. ?   Appearance: Normal appearance. He is well-developed.  ?HENT:  ?   Head: Normocephalic and atraumatic.  ?Eyes:  ?   Conjunctiva/sclera: Conjunctivae normal.  ?   Pupils: Pupils are equal,  round, and reactive to light.  ?Cardiovascular:  ?   Rate and Rhythm: Normal rate and regular rhythm.  ?   Heart sounds: Normal heart sounds.  ?Pulmonary:  ?   Effort: Pulmonary effort is normal. No respiratory distress.  ?   Breath sounds: Normal breath sounds.  ?Abdominal:  ?   General: There is no distension.  ?   Palpations: Abdomen is soft.  ?   Tenderness: There is no abdominal tenderness.  ?Musculoskeletal:     ?   General: No deformity. Normal range of motion.  ?   Cervical back: Normal range of motion and neck supple.  ?Skin: ?    General: Skin is warm and dry.  ?Neurological:  ?   General: No focal deficit present.  ?   Mental Status: He is alert and oriented to person, place, and time.  ?Psychiatric:  ?   Comments: Agitated, erratic, delusional, endorses hallucinations, is communicating active threats to others  ? ? ?ED Results / Procedures / Treatments   ?Labs ?(all labs ordered are listed, but only abnormal results are displayed) ?Labs Reviewed  ?CBC WITH DIFFERENTIAL/PLATELET - Abnormal; Notable for the following components:  ?    Result Value  ? RBC 4.21 (*)   ? HCT 38.4 (*)   ? Platelets 107 (*)   ? All other components within normal limits  ?ETHANOL - Abnormal; Notable for the following components:  ? Alcohol, Ethyl (B) 318 (*)   ? All other components within normal limits  ?SALICYLATE LEVEL - Abnormal; Notable for the following components:  ? Salicylate Lvl <4.4 (*)   ? All other components within normal limits  ?ACETAMINOPHEN LEVEL - Abnormal; Notable for the following components:  ? Acetaminophen (Tylenol), Serum <10 (*)   ? All other components within normal limits  ?BASIC METABOLIC PANEL - Abnormal; Notable for the following components:  ? Sodium 131 (*)   ? Potassium 3.4 (*)   ? Chloride 97 (*)   ? Calcium 8.7 (*)   ? All other components within normal limits  ?RESP PANEL BY RT-PCR (FLU A&B, COVID) [id number]  ?RAPID URINE DRUG SCREEN, HOSP PERFORMED  ? ? ?EKG ?EKG Interpretation ? ?Date/Time:  Wednesday November 24 2021 17:58:32 EDT ?Ventricular Rate:  94 ?PR Interval:  158 ?QRS Duration: 94 ?QT Interval:  368 ?QTC Calculation: 461 ?R Axis:   64 ?Text Interpretation: Sinus rhythm Biatrial enlargement Probable left ventricular hypertrophy Anterior Q waves, possibly due to LVH Confirmed by Dene Gentry [phone]) on 11/24/2021 6:26:36 PM ? ?Radiology ?No results found. ? ?Procedures ?Procedures  ? ? ?Medications Ordered in ED ?Medications  ?sterile water (preservative free) injection (has no administration in time range)  ?ziprasidone  (GEODON) injection 20 mg (20 mg Intramuscular Given 11/24/21 1722)  ? ? ?ED Course/ Medical Decision Making/ A&P ?  ?                        ?Medical Decision Making ?Amount and/or Complexity of Data Reviewed ?Labs: ordered. ? ?Risk ?Prescription drug management. ? ? ? ?Medical Screen Complete ? ?This patient presented to the ED with complaint of mental health evaluation. ? ?This complaint involves an extensive number of treatment options. The initial differential diagnosis includes, but is not limited to, psychosis, polysubstance abuse, etc. ? ?This presentation is: Acute, Chronic, Self-Limited, Previously Undiagnosed, Uncertain Prognosis, Complicated, Systemic Symptoms, and Threat to Life/Bodily Function ? ?Patient is presenting with IVC hold initiated by law enforcement ? ?  Patient with agitation and erratic behavior.  He is clearly a threat to others and to himself. ? ?First exam completed by myself ? ?Medical screening is without evidence of significant pathology requiring additional treatment. ? ?Pending disposition dependent upon psychiatric evaluation and treatment plan. ? ?Additional history obtained: ? ?External records from outside sources obtained and reviewed including prior ED visits and prior Inpatient records.  ? ? ?Lab Tests: ? ?I ordered and personally interpreted labs.  The pertinent results include:  CBC BMP Tylenol level, ethanol level, salicylate level ? ?Cardiac Monitoring: ? ?The patient was maintained on a cardiac monitor.  I personally viewed and interpreted the cardiac monitor which showed an underlying rhythm of: NSR ? ? ?Medicines ordered: ? ?I ordered medication including geodon  for agitation  ?Reevaluation of the patient after these medicines showed that the patient: improved ? ? ? ?Problem List / ED Course: ? ?Agitation, hallucinations, IVC hold ? ? ?Reevaluation: ? ?After the interventions noted above, I reevaluated the patient and found that they have: stayed the  same ? ? ?Disposition: ? ?After consideration of the diagnostic results and the patients response to treatment, I feel that the patent would benefit from psychiatric evaluation and treatment.  ? ? ? ? ? ? ? ? ?Final Clinical Impre

## 2021-11-25 ENCOUNTER — Encounter (HOSPITAL_COMMUNITY): Payer: Self-pay | Admitting: Emergency Medicine

## 2021-11-25 DIAGNOSIS — R451 Restlessness and agitation: Secondary | ICD-10-CM

## 2021-11-25 LAB — RAPID URINE DRUG SCREEN, HOSP PERFORMED
Amphetamines: NOT DETECTED
Barbiturates: NOT DETECTED
Benzodiazepines: NOT DETECTED
Cocaine: POSITIVE — AB
Opiates: NOT DETECTED
Tetrahydrocannabinol: NOT DETECTED

## 2021-11-25 LAB — CBG MONITORING, ED
Glucose-Capillary: 98 mg/dL (ref 70–99)
Glucose-Capillary: 114 mg/dL — ABNORMAL HIGH (ref 70–99)

## 2021-11-25 MED ORDER — QUETIAPINE FUMARATE 100 MG PO TABS: 100.0000 mg | ORAL_TABLET | Freq: Every day | ORAL | Status: DC

## 2021-11-25 MED ORDER — LORAZEPAM 2 MG/ML IJ SOLN: 0.0000 mg | Freq: Four times a day (QID) | INTRAMUSCULAR | Status: DC

## 2021-11-25 MED ORDER — POTASSIUM CHLORIDE CRYS ER 20 MEQ PO TBCR
40.0000 meq | EXTENDED_RELEASE_TABLET | Freq: Once | ORAL | Status: AC
  Administered 2021-11-25: 40 meq via ORAL
  Filled 2021-11-25: qty 2

## 2021-11-25 MED ORDER — INSULIN ASPART 100 UNIT/ML IJ SOLN
0.0000 [IU] | Freq: Three times a day (TID) | INTRAMUSCULAR | Status: DC
  Filled 2021-11-25: qty 0.15

## 2021-11-25 MED ORDER — LORAZEPAM 2 MG/ML IJ SOLN: 0.0000 mg | Freq: Two times a day (BID) | INTRAMUSCULAR | Status: DC

## 2021-11-25 MED ORDER — LORAZEPAM 1 MG PO TABS: 0.0000 mg | ORAL_TABLET | Freq: Two times a day (BID) | ORAL | Status: DC

## 2021-11-25 MED ORDER — LORAZEPAM 1 MG PO TABS
0.0000 mg | ORAL_TABLET | Freq: Four times a day (QID) | ORAL | Status: DC
  Administered 2021-11-25: 1 mg via ORAL
  Administered 2021-11-25: 2 mg via ORAL
  Filled 2021-11-25: qty 1
  Filled 2021-11-25: qty 2
  Filled 2021-11-25: qty 4

## 2021-11-25 MED ORDER — METFORMIN HCL 500 MG PO TABS
500.0000 mg | ORAL_TABLET | Freq: Two times a day (BID) | ORAL | Status: DC
  Administered 2021-11-25: 500 mg via ORAL
  Filled 2021-11-25: qty 1

## 2021-11-25 MED ORDER — EMTRICITABINE-TENOFOVIR AF 200-25 MG PO TABS
1.0000 | ORAL_TABLET | Freq: Every day | ORAL | Status: DC
  Administered 2021-11-25: 1 via ORAL
  Filled 2021-11-25: qty 1

## 2021-11-25 MED ORDER — THIAMINE HCL 100 MG PO TABS
100.0000 mg | ORAL_TABLET | Freq: Every day | ORAL | Status: DC
  Administered 2021-11-25: 100 mg via ORAL
  Filled 2021-11-25: qty 1

## 2021-11-25 MED ORDER — DOLUTEGRAVIR SODIUM 50 MG PO TABS
50.0000 mg | ORAL_TABLET | Freq: Every day | ORAL | Status: DC
Start: 2021-11-25 — End: 2021-11-25
  Administered 2021-11-25: 50 mg via ORAL
  Filled 2021-11-25: qty 1

## 2021-11-25 MED ORDER — THIAMINE HCL 100 MG/ML IJ SOLN
100.0000 mg | Freq: Every day | INTRAMUSCULAR | Status: DC
  Filled 2021-11-25: qty 2
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
## 2021-11-25 NOTE — ED Provider Notes (Addendum)
Cleared by psych.  Discharge.  Hemodynamically stable. ?  Virgina Norfolk, DO ?11/25/21 1404 ? ?  ?Virgina Norfolk, DO ?11/25/21 1407 ? ?

## 2021-11-25 NOTE — ED Provider Notes (Addendum)
Rn requests ciwa protocol, hx etoh abuse, and feeling shaky/tremulous. Pt is alert, oriented, no delusional or agitated, is tremulous. Ciwa protocol ordered.   ? ?In terms of home meds/insulin, pt indicates out of his meds, including insulin for past month, and with that glucose in 90s on labs.  Pt currently notes poor appetite/nausea.   ? ?Given that, will hold on re-starting his (higher dose home) insulin for now, will order glycemic control ss insulin, will ask for diabetes consult re med recs.  ?  ?Signed out to Dr Carmie End to f/u w above, and tts eval/placement.  ? ?  ?Cathren Laine, MD ?11/25/21 [phone] ? ?

## 2021-11-25 NOTE — BH Assessment (Signed)
Clinician messaged Lenice Pressman, RN: "Hey. It's Trey with TTS. Is the pt able to engage in the assessment, if so the pt will need to be placed in a private room. Also is the pt under IVC?" ? ?Clinician awaiting response.  ? ? ?Vertell Novak, MS, [facility], [facility] ?Triage Specialist ?[phone] ? ?

## 2021-11-25 NOTE — ED Notes (Signed)
Another biscuit given. Pt is wakes when prompted and voices appreciation. He knows he is in the hospital and has no complaints.  ?

## 2021-11-25 NOTE — ED Notes (Signed)
Pt ambulated to restroom. 
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
## 2021-11-25 NOTE — ED Notes (Addendum)
Pt up and awake now. Food and water given as well as urinal. Pt voiced appreciation. Pt was able to verbalize he needed to urinate. Urinal given and pt able to use. 550 ml output ?

## 2021-11-25 NOTE — Progress Notes (Incomplete)
Inpatient Diabetes Program Recommendations ? ?AACE/ADA: New Consensus Statement on Inpatient Glycemic Control (2015) ? ?Target Ranges:  Prepandial:   less than 140 mg/dL ?     Peak postprandial:   less than 180 mg/dL (1-2 hours) ?     Critically ill patients:  140 - 180 mg/dL  ? ?Lab Results  ?Component Value Date  ? GLUCAP 114 (H) 11/25/2021  ? HGBA1C 7.0 (H) 11/16/2021  ? ? ?Review of Glycemic Control ? ?Diabetes history: DM2 ?Outpatient Diabetes medications: Lantus 45 units QHS, Novolog 20 units TID, metformin 500 mg BID (has not taken in awhile) ?Current orders for Inpatient glycemic control: Novolog 0-15 units TID ? ?HgbA1C - 7% ?CBGs 98, 114 mg/dL ? ?Inpatient Diabetes Program Recommendations:   ? ?Agree with orders. ? ?Needs TOC consult for assistance with medications, PCP.  ? ?Spoke with pt in ED about his diabetes control. Pt states  ? ? ? ? ? ? ? ? ?

## 2021-11-25 NOTE — ED Notes (Signed)
Pt has drunk a pitcher of water. Diet ginger ale given per his request. When asked how he is feeling, he reports "awful". RN asked why. He states he is withdrawing from alcohol. CIWA completed and MD informed.  ?

## 2021-12-13 ENCOUNTER — Emergency Department (HOSPITAL_COMMUNITY): Payer: Medicaid Other

## 2021-12-13 ENCOUNTER — Inpatient Hospital Stay (HOSPITAL_COMMUNITY)
Admission: EM | Admit: 2021-12-13 | Discharge: 2021-12-17 | DRG: 896 | Disposition: A | Discharge status: Home / Self Care | Payer: Medicaid Other | Attending: Internal Medicine | Admitting: Internal Medicine

## 2021-12-13 ENCOUNTER — Encounter (HOSPITAL_COMMUNITY): Payer: Self-pay | Admitting: Emergency Medicine

## 2021-12-13 ENCOUNTER — Other Ambulatory Visit: Payer: Self-pay

## 2021-12-13 DIAGNOSIS — F10239 Alcohol dependence with withdrawal, unspecified: Principal | ICD-10-CM | POA: Diagnosis present

## 2021-12-13 DIAGNOSIS — I2510 Atherosclerotic heart disease of native coronary artery without angina pectoris: Secondary | ICD-10-CM | POA: Diagnosis present

## 2021-12-13 DIAGNOSIS — F10929 Alcohol use, unspecified with intoxication, unspecified: Principal | ICD-10-CM

## 2021-12-13 DIAGNOSIS — S2231XA Fracture of one rib, right side, initial encounter for closed fracture: Secondary | ICD-10-CM | POA: Diagnosis present

## 2021-12-13 DIAGNOSIS — I10 Essential (primary) hypertension: Secondary | ICD-10-CM

## 2021-12-13 DIAGNOSIS — X58XXXA Exposure to other specified factors, initial encounter: Secondary | ICD-10-CM | POA: Diagnosis present

## 2021-12-13 DIAGNOSIS — F10939 Alcohol use, unspecified with withdrawal, unspecified: Secondary | ICD-10-CM | POA: Diagnosis present

## 2021-12-13 DIAGNOSIS — F259 Schizoaffective disorder, unspecified: Secondary | ICD-10-CM | POA: Diagnosis present

## 2021-12-13 DIAGNOSIS — F329 Major depressive disorder, single episode, unspecified: Secondary | ICD-10-CM | POA: Diagnosis present

## 2021-12-13 DIAGNOSIS — Z681 Body mass index (BMI) 19 or less, adult: Secondary | ICD-10-CM

## 2021-12-13 DIAGNOSIS — F17210 Nicotine dependence, cigarettes, uncomplicated: Secondary | ICD-10-CM | POA: Diagnosis present

## 2021-12-13 DIAGNOSIS — Z59819 Housing instability, housed unspecified: Secondary | ICD-10-CM

## 2021-12-13 DIAGNOSIS — Z20822 Contact with and (suspected) exposure to covid-19: Secondary | ICD-10-CM | POA: Diagnosis present

## 2021-12-13 DIAGNOSIS — F1590 Other stimulant use, unspecified, uncomplicated: Secondary | ICD-10-CM | POA: Diagnosis present

## 2021-12-13 DIAGNOSIS — Z21 Asymptomatic human immunodeficiency virus [HIV] infection status: Secondary | ICD-10-CM

## 2021-12-13 DIAGNOSIS — B1920 Unspecified viral hepatitis C without hepatic coma: Secondary | ICD-10-CM | POA: Diagnosis present

## 2021-12-13 DIAGNOSIS — R440 Auditory hallucinations: Secondary | ICD-10-CM

## 2021-12-13 DIAGNOSIS — E119 Type 2 diabetes mellitus without complications: Secondary | ICD-10-CM | POA: Diagnosis present

## 2021-12-13 DIAGNOSIS — H5462 Unqualified visual loss, left eye, normal vision right eye: Secondary | ICD-10-CM | POA: Diagnosis present

## 2021-12-13 DIAGNOSIS — Z8659 Personal history of other mental and behavioral disorders: Secondary | ICD-10-CM

## 2021-12-13 DIAGNOSIS — Z5900 Homelessness unspecified: Secondary | ICD-10-CM

## 2021-12-13 DIAGNOSIS — E785 Hyperlipidemia, unspecified: Secondary | ICD-10-CM | POA: Diagnosis present

## 2021-12-13 DIAGNOSIS — B20 Human immunodeficiency virus [HIV] disease: Secondary | ICD-10-CM | POA: Diagnosis present

## 2021-12-13 DIAGNOSIS — E43 Unspecified severe protein-calorie malnutrition: Secondary | ICD-10-CM | POA: Diagnosis present

## 2021-12-13 DIAGNOSIS — J189 Pneumonia, unspecified organism: Secondary | ICD-10-CM

## 2021-12-13 DIAGNOSIS — F1490 Cocaine use, unspecified, uncomplicated: Secondary | ICD-10-CM | POA: Diagnosis present

## 2021-12-13 DIAGNOSIS — D696 Thrombocytopenia, unspecified: Secondary | ICD-10-CM | POA: Diagnosis present

## 2021-12-13 DIAGNOSIS — B370 Candidal stomatitis: Secondary | ICD-10-CM | POA: Diagnosis present

## 2021-12-13 LAB — CBC WITH DIFFERENTIAL/PLATELET
Abs Immature Granulocytes: 0.05 10*3/uL (ref 0.00–0.07)
Basophils Absolute: 0 10*3/uL (ref 0.0–0.1)
Basophils Relative: 0 %
Eosinophils Absolute: 0 10*3/uL (ref 0.0–0.5)
Eosinophils Relative: 0 %
HCT: 40.5 % (ref 39.0–52.0)
Hemoglobin: 14 g/dL (ref 13.0–17.0)
Immature Granulocytes: 1 %
Lymphocytes Relative: 29 %
Lymphs Abs: 2.1 10*3/uL (ref 0.7–4.0)
MCH: 32.1 pg (ref 26.0–34.0)
MCHC: 34.6 g/dL (ref 30.0–36.0)
MCV: 92.9 fL (ref 80.0–100.0)
Monocytes Absolute: 0.4 10*3/uL (ref 0.1–1.0)
Monocytes Relative: 5 %
Neutro Abs: 4.8 10*3/uL (ref 1.7–7.7)
Neutrophils Relative %: 65 %
Platelets: UNDETERMINED 10*3/uL (ref 150–400)
RBC: 4.36 MIL/uL (ref 4.22–5.81)
RDW: 14.5 % (ref 11.5–15.5)
WBC: 7.4 10*3/uL (ref 4.0–10.5)
nRBC: 0 % (ref 0.0–0.2)

## 2021-12-13 LAB — COMPREHENSIVE METABOLIC PANEL
ALT: 64 U/L — ABNORMAL HIGH (ref 0–44)
AST: 80 U/L — ABNORMAL HIGH (ref 15–41)
Albumin: 3.4 g/dL — ABNORMAL LOW (ref 3.5–5.0)
Alkaline Phosphatase: 62 U/L (ref 38–126)
Anion gap: 9 (ref 5–15)
BUN: 7 mg/dL (ref 6–20)
CO2: 23 mmol/L (ref 22–32)
Calcium: 8.6 mg/dL — ABNORMAL LOW (ref 8.9–10.3)
Chloride: 103 mmol/L (ref 98–111)
Creatinine, Ser: 0.64 mg/dL (ref 0.61–1.24)
GFR, Estimated: >60 mL/min (ref >60)
Glucose, Bld: 74 mg/dL (ref 70–99)
Potassium: 4.1 mmol/L (ref 3.5–5.1)
Sodium: 135 mmol/L (ref 135–145)
Total Bilirubin: 0.5 mg/dL (ref 0.3–1.2)
Total Protein: 8 g/dL (ref 6.5–8.1)

## 2021-12-13 LAB — TROPONIN I (HIGH SENSITIVITY)
Troponin I (High Sensitivity): 11 ng/L (ref <18)
Troponin I (High Sensitivity): 11 ng/L (ref <18)

## 2021-12-13 LAB — SALICYLATE LEVEL: Salicylate Lvl: <7 mg/dL — ABNORMAL LOW (ref 7.0–30.0)

## 2021-12-13 LAB — ACETAMINOPHEN LEVEL: Acetaminophen (Tylenol), Serum: <10 ug/mL — ABNORMAL LOW (ref 10–30)

## 2021-12-13 LAB — ETHANOL: Alcohol, Ethyl (B): 302 mg/dL (ref <10)
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
## 2021-12-13 NOTE — ED Triage Notes (Signed)
Pt noted to have "odd" behaviors at time of triage. Upon RN arrival to triage room, pt noted to be lying on the floor, initially refusing to sit in wheelchair or triage chair. Pt eventually able to be redirected to triage chair. Denies andy SI but states he thinks about "burning people's houses down".   ?

## 2021-12-13 NOTE — ED Triage Notes (Signed)
Pt brought to ED with c/o weakness and sore throat x3 days. States he has not been able to get diabetes and HIV medications x3 months.  ?
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
## 2021-12-14 ENCOUNTER — Emergency Department (HOSPITAL_COMMUNITY): Payer: Medicaid Other

## 2021-12-14 DIAGNOSIS — Z681 Body mass index (BMI) 19 or less, adult: Secondary | ICD-10-CM | POA: Diagnosis not present

## 2021-12-14 DIAGNOSIS — Z59819 Housing instability, housed unspecified: Secondary | ICD-10-CM | POA: Diagnosis not present

## 2021-12-14 DIAGNOSIS — E785 Hyperlipidemia, unspecified: Secondary | ICD-10-CM | POA: Diagnosis present

## 2021-12-14 DIAGNOSIS — F10239 Alcohol dependence with withdrawal, unspecified: Secondary | ICD-10-CM | POA: Diagnosis present

## 2021-12-14 DIAGNOSIS — S2231XA Fracture of one rib, right side, initial encounter for closed fracture: Secondary | ICD-10-CM | POA: Diagnosis present

## 2021-12-14 DIAGNOSIS — Z8659 Personal history of other mental and behavioral disorders: Secondary | ICD-10-CM | POA: Diagnosis not present

## 2021-12-14 DIAGNOSIS — E43 Unspecified severe protein-calorie malnutrition: Secondary | ICD-10-CM | POA: Diagnosis present

## 2021-12-14 DIAGNOSIS — E119 Type 2 diabetes mellitus without complications: Secondary | ICD-10-CM | POA: Diagnosis present

## 2021-12-14 DIAGNOSIS — I2510 Atherosclerotic heart disease of native coronary artery without angina pectoris: Secondary | ICD-10-CM | POA: Diagnosis present

## 2021-12-14 DIAGNOSIS — J189 Pneumonia, unspecified organism: Secondary | ICD-10-CM | POA: Diagnosis present

## 2021-12-14 DIAGNOSIS — F329 Major depressive disorder, single episode, unspecified: Secondary | ICD-10-CM | POA: Diagnosis present

## 2021-12-14 DIAGNOSIS — F10939 Alcohol use, unspecified with withdrawal, unspecified: Secondary | ICD-10-CM

## 2021-12-14 DIAGNOSIS — R440 Auditory hallucinations: Secondary | ICD-10-CM | POA: Diagnosis not present

## 2021-12-14 DIAGNOSIS — B1920 Unspecified viral hepatitis C without hepatic coma: Secondary | ICD-10-CM | POA: Diagnosis present

## 2021-12-14 DIAGNOSIS — B20 Human immunodeficiency virus [HIV] disease: Secondary | ICD-10-CM | POA: Diagnosis present

## 2021-12-14 DIAGNOSIS — F1490 Cocaine use, unspecified, uncomplicated: Secondary | ICD-10-CM | POA: Diagnosis present

## 2021-12-14 DIAGNOSIS — H5462 Unqualified visual loss, left eye, normal vision right eye: Secondary | ICD-10-CM | POA: Diagnosis present

## 2021-12-14 DIAGNOSIS — Z5900 Homelessness unspecified: Secondary | ICD-10-CM | POA: Diagnosis not present

## 2021-12-14 DIAGNOSIS — X58XXXA Exposure to other specified factors, initial encounter: Secondary | ICD-10-CM | POA: Diagnosis present

## 2021-12-14 DIAGNOSIS — F10930 Alcohol use, unspecified with withdrawal, uncomplicated: Secondary | ICD-10-CM | POA: Diagnosis not present

## 2021-12-14 DIAGNOSIS — Z20822 Contact with and (suspected) exposure to covid-19: Secondary | ICD-10-CM | POA: Diagnosis present

## 2021-12-14 DIAGNOSIS — F17210 Nicotine dependence, cigarettes, uncomplicated: Secondary | ICD-10-CM | POA: Diagnosis present

## 2021-12-14 DIAGNOSIS — F259 Schizoaffective disorder, unspecified: Secondary | ICD-10-CM | POA: Diagnosis present

## 2021-12-14 DIAGNOSIS — D696 Thrombocytopenia, unspecified: Secondary | ICD-10-CM | POA: Diagnosis present

## 2021-12-14 DIAGNOSIS — B370 Candidal stomatitis: Secondary | ICD-10-CM | POA: Diagnosis present

## 2021-12-14 DIAGNOSIS — F1590 Other stimulant use, unspecified, uncomplicated: Secondary | ICD-10-CM | POA: Diagnosis present

## 2021-12-14 LAB — LIPID PANEL
Cholesterol: 232 mg/dL — ABNORMAL HIGH (ref 0–200)
HDL: 100 mg/dL (ref >40)
LDL Cholesterol: 118 mg/dL — ABNORMAL HIGH (ref 0–99)
Total CHOL/HDL Ratio: 2.3 RATIO
Triglycerides: 68 mg/dL (ref <150)
VLDL: 14 mg/dL (ref 0–40)

## 2021-12-14 LAB — PHOSPHORUS: Phosphorus: 3.5 mg/dL (ref 2.5–4.6)

## 2021-12-14 LAB — HEPATITIS PANEL, ACUTE
HCV Ab: REACTIVE — AB
Hep A IgM: NONREACTIVE
Hep B C IgM: NONREACTIVE
Hepatitis B Surface Ag: NONREACTIVE

## 2021-12-14 LAB — MAGNESIUM: Magnesium: 1.7 mg/dL (ref 1.7–2.4)

## 2021-12-14 LAB — RESP PANEL BY RT-PCR (FLU A&B, COVID) ARPGX2
Influenza A by PCR: NEGATIVE
Influenza B by PCR: NEGATIVE
SARS Coronavirus 2 by RT PCR: NEGATIVE

## 2021-12-14 LAB — STREP PNEUMONIAE URINARY ANTIGEN: Strep Pneumo Urinary Antigen: NEGATIVE

## 2021-12-14 LAB — T-HELPER CELLS (CD4) COUNT (NOT AT ARMC)
CD4 % Helper T Cell: 13 % — ABNORMAL LOW (ref 33–65)
CD4 T Cell Abs: 193 /uL — ABNORMAL LOW (ref 400–1790)

## 2021-12-14 LAB — TSH: TSH: 0.689 u[IU]/mL (ref 0.350–4.500)

## 2021-12-14 MED ORDER — EMTRICITABINE-TENOFOVIR AF 200-25 MG PO TABS: 1.0000 | ORAL_TABLET | Freq: Every day | ORAL | 2 refills | Status: AC

## 2021-12-14 MED ORDER — ACETAMINOPHEN 650 MG RE SUPP: 650.0000 mg | Freq: Four times a day (QID) | RECTAL | Status: DC | PRN

## 2021-12-14 MED ORDER — SODIUM CHLORIDE 0.9 % IV SOLN
1.0000 g | INTRAVENOUS | Status: DC
  Administered 2021-12-15: 1 g via INTRAVENOUS
  Filled 2021-12-14: qty 10

## 2021-12-14 MED ORDER — FOLIC ACID 1 MG PO TABS
1.0000 mg | ORAL_TABLET | Freq: Every day | ORAL | Status: DC
  Administered 2021-12-15 – 2021-12-17 (×3): 1 mg via ORAL
  Filled 2021-12-14 (×3): qty 1

## 2021-12-14 MED ORDER — DOXYCYCLINE HYCLATE 100 MG PO TABS
100.0000 mg | ORAL_TABLET | Freq: Once | ORAL | Status: AC
Start: 2021-12-14 — End: 2021-12-14
  Administered 2021-12-14: 100 mg via ORAL
  Filled 2021-12-14: qty 1

## 2021-12-14 MED ORDER — ACETAMINOPHEN 500 MG PO TABS
1000.0000 mg | ORAL_TABLET | Freq: Once | ORAL | Status: AC
  Administered 2021-12-14: 1000 mg via ORAL
  Filled 2021-12-14: qty 2

## 2021-12-14 MED ORDER — SODIUM CHLORIDE 0.9 % IV SOLN
500.0000 mg | Freq: Once | INTRAVENOUS | Status: AC
  Administered 2021-12-14: 500 mg via INTRAVENOUS
  Filled 2021-12-14: qty 5

## 2021-12-14 MED ORDER — ONDANSETRON HCL 4 MG PO TABS: 4.0000 mg | ORAL_TABLET | Freq: Four times a day (QID) | ORAL | Status: DC | PRN

## 2021-12-14 MED ORDER — HYDRALAZINE HCL 10 MG PO TABS
10.0000 mg | ORAL_TABLET | Freq: Three times a day (TID) | ORAL | Status: DC | PRN
  Administered 2021-12-14: 10 mg via ORAL
  Filled 2021-12-14: qty 1

## 2021-12-14 MED ORDER — DOLUTEGRAVIR SODIUM 50 MG PO TABS: 50.0000 mg | ORAL_TABLET | Freq: Every day | ORAL | 2 refills | Status: AC

## 2021-12-14 MED ORDER — SODIUM CHLORIDE 0.9 % IV SOLN
INTRAVENOUS | Status: AC
Start: 2021-12-14 — End: 2021-12-15

## 2021-12-14 MED ORDER — LORAZEPAM 2 MG/ML IJ SOLN
1.0000 mg | Freq: Once | INTRAMUSCULAR | Status: AC
Start: 2021-12-14 — End: 2021-12-14
  Administered 2021-12-14: 1 mg via INTRAVENOUS
  Filled 2021-12-14: qty 1

## 2021-12-14 MED ORDER — SODIUM CHLORIDE 0.9 % IV SOLN
1.0000 g | Freq: Once | INTRAVENOUS | Status: AC
  Administered 2021-12-14: 1 g via INTRAVENOUS
  Filled 2021-12-14: qty 10

## 2021-12-14 MED ORDER — ONDANSETRON HCL 4 MG/2ML IJ SOLN: 4.0000 mg | Freq: Four times a day (QID) | INTRAMUSCULAR | Status: DC | PRN

## 2021-12-14 MED ORDER — THIAMINE HCL 100 MG/ML IJ SOLN
100.0000 mg | Freq: Every day | INTRAMUSCULAR | Status: DC
  Administered 2021-12-15 – 2021-12-17 (×2): 100 mg via INTRAVENOUS
  Filled 2021-12-14 (×3): qty 2

## 2021-12-14 MED ORDER — ENOXAPARIN SODIUM 40 MG/0.4ML IJ SOSY
40.0000 mg | PREFILLED_SYRINGE | INTRAMUSCULAR | Status: DC
  Administered 2021-12-14 – 2021-12-16 (×2): 40 mg via SUBCUTANEOUS
  Filled 2021-12-14 (×3): qty 0.4

## 2021-12-14 MED ORDER — LORAZEPAM 1 MG PO TABS
0.0000 mg | ORAL_TABLET | ORAL | Status: DC | PRN
  Filled 2021-12-14: qty 1
  Filled 2021-12-14: qty 4

## 2021-12-14 MED ORDER — LORAZEPAM 2 MG/ML IJ SOLN
0.0000 mg | Freq: Four times a day (QID) | INTRAMUSCULAR | Status: DC
  Administered 2021-12-14: 2 mg via INTRAVENOUS
  Filled 2021-12-14: qty 1

## 2021-12-14 MED ORDER — IOHEXOL 300 MG/ML  SOLN
75.0000 mL | Freq: Once | INTRAMUSCULAR | Status: AC | PRN
  Administered 2021-12-14: 75 mL via INTRAVENOUS

## 2021-12-14 MED ORDER — ADULT MULTIVITAMIN W/MINERALS CH
1.0000 | ORAL_TABLET | Freq: Every day | ORAL | Status: DC
  Administered 2021-12-15 – 2021-12-17 (×3): 1 via ORAL
  Filled 2021-12-14 (×3): qty 1

## 2021-12-14 MED ORDER — AZITHROMYCIN 500 MG PO TABS
250.0000 mg | ORAL_TABLET | Freq: Every day | ORAL | Status: DC
  Administered 2021-12-15 – 2021-12-17 (×3): 250 mg via ORAL
  Filled 2021-12-14 (×3): qty 1

## 2021-12-14 MED ORDER — LORAZEPAM 2 MG/ML IJ SOLN
0.0000 mg | INTRAMUSCULAR | Status: DC | PRN
  Administered 2021-12-14 – 2021-12-15 (×3): 2 mg via INTRAVENOUS
  Filled 2021-12-14 (×2): qty 1

## 2021-12-14 MED ORDER — ACETAMINOPHEN 325 MG PO TABS
650.0000 mg | ORAL_TABLET | Freq: Four times a day (QID) | ORAL | Status: DC | PRN
  Administered 2021-12-15: 650 mg via ORAL
  Filled 2021-12-14: qty 2

## 2021-12-14 MED ORDER — LORAZEPAM 2 MG/ML IJ SOLN
0.0000 mg | Freq: Two times a day (BID) | INTRAMUSCULAR | Status: DC
  Filled 2021-12-14: qty 1

## 2021-12-14 MED ORDER — LORAZEPAM 1 MG PO TABS
0.0000 mg | ORAL_TABLET | Freq: Two times a day (BID) | ORAL | Status: DC
  Administered 2021-12-16: 1 mg via ORAL

## 2021-12-14 MED ORDER — THIAMINE HCL 100 MG PO TABS
100.0000 mg | ORAL_TABLET | Freq: Every day | ORAL | Status: DC
  Administered 2021-12-14 – 2021-12-16 (×2): 100 mg via ORAL
  Filled 2021-12-14 (×2): qty 1

## 2021-12-14 MED ORDER — LORAZEPAM 1 MG PO TABS
0.0000 mg | ORAL_TABLET | Freq: Four times a day (QID) | ORAL | Status: DC
  Administered 2021-12-14: 2 mg via ORAL
  Administered 2021-12-14: 1 mg via ORAL
  Filled 2021-12-14: qty 1
  Filled 2021-12-14: qty 2

## 2021-12-14 MED ORDER — QUETIAPINE FUMARATE 100 MG PO TABS
100.0000 mg | ORAL_TABLET | Freq: Every day | ORAL | Status: DC
  Administered 2021-12-14 – 2021-12-15 (×3): 100 mg via ORAL
  Filled 2021-12-14 (×4): qty 1

## 2021-12-14 MED ORDER — DOXYCYCLINE HYCLATE 100 MG PO TABS
100.0000 mg | ORAL_TABLET | Freq: Two times a day (BID) | ORAL | Status: DC
  Administered 2021-12-14: 100 mg via ORAL
  Filled 2021-12-14: qty 1

## 2021-12-14 MED ORDER — DOXYCYCLINE HYCLATE 100 MG PO CAPS: 100.0000 mg | ORAL_CAPSULE | Freq: Two times a day (BID) | ORAL | 0 refills | Status: DC

## 2021-12-14 MED ORDER — SENNOSIDES-DOCUSATE SODIUM 8.6-50 MG PO TABS: 1.0000 | ORAL_TABLET | Freq: Every evening | ORAL | Status: DC | PRN

## 2021-12-14 NOTE — ED Notes (Signed)
Notified Dr. Ashok Cordia of low grade fever of 100.0 at this time. Verbal given for 1000 mg of tylenol. Continuing to monitor.  ?

## 2021-12-14 NOTE — BH Assessment (Signed)
Clinician made contact with pt's team to verify pt's medical admission. Pt's team verified that pt is being medically admitted. Clinician informed pt's team that a psych consult would be most beneficial for pt since psych can see pt on the medical floor whereas TTS cannot. Request was made for TTS assessment to be d/c and psych consult to be ordered. ?

## 2021-12-14 NOTE — ED Notes (Signed)
Notified Dr. Denton Lank that upon assessment patient is tachycardic in the 125-130s. Patient tremulous, seeming to be in withdrawal. Dr. Denton Lank placing additional orders.  ?
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
## 2021-12-14 NOTE — ED Provider Notes (Signed)
Patient in ED, has pna on imaging, hx hiv, cd4 pending. Also with etoh use disorder and inetoh withdrawal - tachycardic, hypertensive.  ? ?Will consult medicine for admission. Ciwa. Iv abx.  Ativan iv.  ? ? ?  ?Cathren Laine, MD ?12/14/21 1210 ? ?

## 2021-12-14 NOTE — ED Notes (Signed)
Patient taken to bathroom with this RN and [place] NT. Patient with undergarments on saturated with urine and fecal matter. This RN and NT changed patient's brief and changed into burgandy scrubs. Belongings also taken and bagged in a belongings bag.  ?

## 2021-12-14 NOTE — ED Notes (Signed)
Notified Agricultural consultant for need for room with telemetry capability. No room available at this time. Patient attached to telemetry monitoring in the hallway.  ?

## 2021-12-14 NOTE — Progress Notes (Signed)
MD paged for patients bp. PRN added systolic > 180. Pt resting comfortably. CIWA score 4. Will continue to monitor.  ?

## 2021-12-15 ENCOUNTER — Inpatient Hospital Stay (HOSPITAL_COMMUNITY): Payer: Medicaid Other

## 2021-12-15 DIAGNOSIS — F10930 Alcohol use, unspecified with withdrawal, uncomplicated: Secondary | ICD-10-CM

## 2021-12-15 DIAGNOSIS — B20 Human immunodeficiency virus [HIV] disease: Secondary | ICD-10-CM | POA: Diagnosis not present

## 2021-12-15 DIAGNOSIS — F10939 Alcohol use, unspecified with withdrawal, unspecified: Secondary | ICD-10-CM | POA: Diagnosis not present

## 2021-12-15 DIAGNOSIS — Z8659 Personal history of other mental and behavioral disorders: Secondary | ICD-10-CM | POA: Diagnosis not present

## 2021-12-15 DIAGNOSIS — J189 Pneumonia, unspecified organism: Secondary | ICD-10-CM

## 2021-12-15 LAB — CBC
HCT: 42.7 % (ref 39.0–52.0)
Hemoglobin: 14.3 g/dL (ref 13.0–17.0)
MCH: 30.8 pg (ref 26.0–34.0)
MCHC: 33.5 g/dL (ref 30.0–36.0)
MCV: 92 fL (ref 80.0–100.0)
Platelets: 51 10*3/uL — ABNORMAL LOW (ref 150–400)
RBC: 4.64 MIL/uL (ref 4.22–5.81)
RDW: 13.9 % (ref 11.5–15.5)
WBC: 6.2 10*3/uL (ref 4.0–10.5)
nRBC: 0 % (ref 0.0–0.2)

## 2021-12-15 LAB — TROPONIN I (HIGH SENSITIVITY): Troponin I (High Sensitivity): 18 ng/L — ABNORMAL HIGH (ref <18)

## 2021-12-15 LAB — D-DIMER, QUANTITATIVE: D-Dimer, Quant: 2.63 ug/mL-FEU — ABNORMAL HIGH (ref 0.00–0.50)

## 2021-12-15 LAB — BASIC METABOLIC PANEL
Anion gap: 15 (ref 5–15)
BUN: 9 mg/dL (ref 6–20)
CO2: 19 mmol/L — ABNORMAL LOW (ref 22–32)
Calcium: 8.7 mg/dL — ABNORMAL LOW (ref 8.9–10.3)
Chloride: 96 mmol/L — ABNORMAL LOW (ref 98–111)
Creatinine, Ser: 0.71 mg/dL (ref 0.61–1.24)
GFR, Estimated: >60 mL/min (ref >60)
Glucose, Bld: 77 mg/dL (ref 70–99)
Potassium: 3.9 mmol/L (ref 3.5–5.1)
Sodium: 130 mmol/L — ABNORMAL LOW (ref 135–145)

## 2021-12-15 LAB — TECHNOLOGIST SMEAR REVIEW: Plt Morphology: NORMAL

## 2021-12-15 LAB — HIV-1 RNA QUANT-NO REFLEX-BLD
HIV 1 RNA Quant: 42800 copies/mL
LOG10 HIV-1 RNA: 4.631 log10copy/mL

## 2021-12-15 LAB — PATHOLOGIST SMEAR REVIEW: Path Review: NORMAL

## 2021-12-15 LAB — LEGIONELLA PNEUMOPHILA SEROGP 1 UR AG: L. pneumophila Serogp 1 Ur Ag: NEGATIVE

## 2021-12-15 MED ORDER — IOHEXOL 300 MG/ML  SOLN
66.0000 mL | Freq: Once | INTRAMUSCULAR | Status: AC | PRN
  Administered 2021-12-15: 66 mL via INTRAVENOUS

## 2021-12-15 MED ORDER — ENSURE ENLIVE PO LIQD
237.0000 mL | Freq: Three times a day (TID) | ORAL | Status: DC
  Administered 2021-12-16 – 2021-12-17 (×4): 237 mL via ORAL

## 2021-12-15 MED ORDER — SODIUM CHLORIDE 0.9 % IV SOLN: INTRAVENOUS | Status: DC

## 2021-12-15 MED ORDER — NYSTATIN 100000 UNIT/ML MT SUSP
5.0000 mL | Freq: Four times a day (QID) | OROMUCOSAL | Status: DC
  Administered 2021-12-15 – 2021-12-17 (×9): 500000 [IU] via OROMUCOSAL
  Filled 2021-12-15 (×9): qty 5

## 2021-12-15 NOTE — Consult Note (Addendum)
?   ? ? ? ? ?[facility]   ? ?Date of Admission:  12/13/2021    ? ?Reason for Consult: aids/pna    ?Referring Provider: Lafonda MossesMachen ? ? ?Abx: ?5/16-c ceftriaxone ?5/16-c azithromycin ?  ? ? ? ?Assessment/Plan:     ? ?#CAP ?Sx of cough/dyspnea. Imaging bilateral multifocal airspace disease on top of emphysematous changes ?No leukocytosis or fever on presentation ?Highest still CAP spectrum viral >> bacterial (strep/h flu) ?Will keep OI causes on the ddx but at this time low suspicion ? ?He remains on room air ? ?-would plan 5 day treatment with ceftriaxone/azithromycin. If taking PO well could transition to oral abx and finish on 5/21 ?-if sign of progression will consider further OI w/u which would commonly be pjp. No imaging sign of nocardia/rhodococcus process otherwise ? ? ?#hiv/aids ?Dx'ed 1990s has been on various regimen of tx since then including thymidine analogue based nrti, triumeq, and most recently biktarvy ?01/2021 genotype no integrase strand inhibitor or NRTI or PI resistance ?Homeless complicates compliance. Off and on treatment but none since middle 2022 ?Pending viral load; Cd4 13% and 190s; percentage declining over the years without consistent ART ?No obvious sign OI at this time ? ?-I am not sure if possible, he already is working with county health case Production designer, theatre/television/filmmanager per his report to stabilize his housing situation, which is the main barrier to maintaining him on ART. Would discuss with case manager here if any other resource possible. ?-his ongoing substance use also complicates the matter ?-no ART at this time ?-will setup follow up with Marcos EkeGreg Calone his hiv provider and see if he is able to come; please provide bus passes for him ?-repeat genotype ? ? ?----------- ?Appointment with [facility] ? ?5/25 @ 145pm with Tammy SoursGreg ? ? ? ?#hx chronic hep c ?#hcm ?S/p 8 weeks mavyret 2019 with SVR confirmed  ?Query ongoing risk factors for hep c though ?For hcm purpose would be reasonable to  repeat hep C rna and hepatitis b serology along with syphilis screen ? ? ? ?------------------------------------------------ ?Principal Problem: ?  Alcohol withdrawal (HCC) ? ? ? ?HPI: Derrick Cooper is a 61 y.o. male hiv/aids not on ART, homeless, substance abuse, admitted 5/15 after 2 weeks mostly dry cough and malaise, poor appetite ? ?Patient has been homeless. He is working with a [place] case manager to get housing situation better. He reports using etoh and substances make him forget and not fill out appropriate paperwork. At this time he has not anticipated better living situation ? ?He has been having a dry cough with some green sputum at times. And had developed right sided chest wall pain in this setting. Associated decreased appetite and weakness. He came for admission for this reason ? ?Denies fever, chill, nightsweat, rash, headache, n/v/diarrhea, joint pain, dysuria ? 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 ?I reviewed his hiv record. [facility] Various regimen but recently biktarvy however not taking the past 6 months at least in setting homelessness. Hiv hiv at some point was undetectable while on meds, and his most recent genotype 01/2021 showed no resistance to rt/pi/ini ? ?He has chronic hep c but treated with 8 weeks mavyret 2019 with confirmed SVR testing ? ?On arrival afebrile; hds; no leukocytosis ?Chest imaging with bilateral focal airspace opacity and emphysema and also posterior right 9th rib fracture which probably is the main cause for chest pain ?Started on ctrx/azith ?Stable ?On ciwa  protocol as well ?Remains on room air and no fever ?Cd4 190s, 13%; viral load pending ? ? ? ? ? ?Family History  ?Problem Relation Age of Onset  ? Breast cancer Mother   ? Lung cancer Father   ? Heart attack Father   ? ? ?Social History  ? ?Tobacco Use  ? Smoking status: Every Day  ?  Packs/day: 0.50  ?  Years: 42.00  ?  Pack years: 21.00  ?  Types: Cigarettes  ?  Smokeless tobacco: Never  ?Vaping Use  ? Vaping Use: Never used  ?Substance Use Topics  ? Alcohol use: Yes  ?  Comment: Clean for 2 months   ? Drug use: Yes  ?  Types: Marijuana, Methamphetamines, "Crack" cocaine  ?  Comment: Has used crack in the last month; marajuana  occasionally  ? ? ?No Known Allergies ? ?Review of Systems: ?ROS ?All Other ROS was negative, except mentioned above ? ? ?Past Medical History:  ?Diagnosis Date  ? Diabetes mellitus without complication (HCC)   ? Hepatitis C   ? HIV infection (HCC)   ? Schizophrenia (HCC)   ? Substance abuse (HCC)   ? ? ? ? ? ?Scheduled Meds: ? azithromycin  250 mg Oral Daily  ? enoxaparin (LOVENOX) injection  40 mg Subcutaneous Q24H  ? folic acid  1 mg Oral Daily  ? [START ON 12/16/2021] LORazepam  0-4 mg Intravenous Q12H  ? Or  ? [START ON 12/16/2021] LORazepam  0-4 mg Oral Q12H  ? multivitamin with minerals  1 tablet Oral Daily  ? nystatin  5 mL Mouth/Throat QID  ? QUEtiapine  100 mg Oral QHS  ? thiamine  100 mg Oral Daily  ? Or  ? thiamine  100 mg Intravenous Daily  ? ?Continuous Infusions: ? sodium chloride 100 mL/hr at 12/15/21 0744  ? cefTRIAXone (ROCEPHIN)  IV    ? ?PRN Meds:.acetaminophen **OR** acetaminophen, hydrALAZINE, LORazepam **OR** LORazepam, ondansetron **OR** ondansetron (ZOFRAN) IV, senna-docusate ? ? ?OBJECTIVE: ?Blood pressure (!) 146/96, pulse (!) 110, temperature 98.4 ?F (36.9 ?C), temperature source Oral, resp. rate 18, SpO2 94 %. ? ?Physical Exam ? ?General/constitutional: no distress, pleasant; slight dishevel ?HEENT: Normocephalic, PER, Conj Clear, EOMI; poor dentition ?Neck supple ?CV: rrr no mrg ?Lungs: clear to auscultation, normal respiratory effort ?Abd: Soft, Nontender ?Ext: no edema ?Skin: No Rash; some toe nail fungal changes  ?Neuro: nonfocal ?MSK: no peripheral joint swelling/tenderness/warmth ? ? ?Lab Results ?Lab Results  ?Component Value Date  ? WBC 6.2 12/15/2021  ? HGB 14.3 12/15/2021  ? HCT 42.7 12/15/2021  ? MCV 92.0  12/15/2021  ? PLT 51 (L) 12/15/2021  ?  ?Lab Results  ?Component Value Date  ? CREATININE 0.71 12/15/2021  ? BUN 9 12/15/2021  ? NA 130 (L) 12/15/2021  ? K 3.9 12/15/2021  ? CL 96 (L) 12/15/2021  ? CO2 19 (L) 12/15/2021  ?  ?Lab Results  ?Component Value Date  ? ALT 64 (H) 12/13/2021  ? AST 80 (H) 12/13/2021  ? GGT 35 10/26/2017  ? ALKPHOS 62 12/13/2021  ? BILITOT 0.5 12/13/2021  ?  ? ? ?Microbiology: ?Recent Results (from the past 240 hour(s))  ?Resp Panel by RT-PCR (Flu A&B, Covid) Nasopharyngeal Swab     Status: None  ? Collection Time: 12/14/21 12:03 AM  ? Specimen: Nasopharyngeal Swab; Nasopharyngeal(NP) swabs in vial transport medium  ?Result Value Ref Range Status  ? SARS Coronavirus 2 by RT PCR NEGATIVE NEGATIVE Final  ?  Comment: (  NOTE) ?SARS-CoV-2 target nucleic acids are NOT DETECTED. ? ?The SARS-CoV-2 RNA is generally detectable in upper respiratory ?specimens during the acute phase of infection. The lowest ?concentration of SARS-CoV-2 viral copies this assay can detect is ?138 copies/mL. A negative result does not preclude SARS-Cov-2 ?infection and should not be used as the sole basis for treatment or ?other patient management decisions. A negative result may occur with  ?improper specimen collection/handling, submission of specimen other ?than nasopharyngeal swab, presence of viral mutation(s) within the ?areas targeted by this assay, and inadequate number of viral ?copies(<138 copies/mL). A negative result must be combined with ?clinical observations, patient history, and epidemiological ?information. The expected result is Negative. ? ?Fact Sheet for Patients:  ?BloggerCourse.com ? ?Fact Sheet for Healthcare Providers:  ?SeriousBroker.it ? ?This test is no t yet approved or cleared by the [place] FDA and  ?has been authorized for detection and/or diagnosis of SARS-CoV-2 by ?FDA under an Emergency Use Authorization (EUA). This EUA will remain  ?in  effect (meaning this test can be used) for the duration of the ?COVID-19 declaration under Section 564(b)(1) of the Act, 21 ?U.S.C.section 360bbb-3(b)(1), unless the authorization is terminated  ?or revoked sooner.  ?

## 2021-12-15 NOTE — Plan of Care (Signed)
°  Problem: Clinical Measurements: °Goal: Ability to maintain a body temperature in the normal range will improve °Outcome: Progressing °  °Problem: Respiratory: °Goal: Ability to maintain adequate ventilation will improve °Outcome: Progressing °Goal: Ability to maintain a clear airway will improve °Outcome: Progressing °  °Problem: Respiratory: °Goal: Ability to maintain a clear airway will improve °Outcome: Progressing °  °

## 2021-12-15 NOTE — Progress Notes (Addendum)
Clinical update: ? ?Received page from RN at 2000 that the patient is wanting to leave [place]. The patient expressed to the RN that he is wanting to leave the hospital, as he wants to drink alcohol. My colleague, Dr. Eulas Post, spoke with the patient around 1900 as he was requesting to leave AMA at that time, as well. At that time, the patient stated although he wanted to leave, he was too weak to physically do so. ? ?I evaluated the patient at 2000 as he was standing at the door and he stated that he wants his belongings.  We do not have any of his belongings on the floor, but I told the patient that we would call down to the ED to see if they had any of this clothes. He states that he wants to leave AMA, however, he is redirectable and got back into bed after hearing we would look for his belongings.  ? ?Derrick Raske Raymondo Band, DO ?Internal Medicine Resident, PGY-1 ?On-call pager: [phone] ? ?ADDENDUM:  ?Paged again at 2025 that the patient is roaming the hallways and refuses to stay in his room. He pulled out his IV and is refusing to have another one placed at this time. Evaluated the patient at the bedside again and discussed the risks of him leaving AMA, including worsening of his CAP and worsening of his respiratory status, going through alcohol withdrawals, and death. The patient understands the risks of leaving AMA and has capacity, however, after being given a few more warm blankets, he was agreeable to go back into his room and in his bed. The patient does not have a plan as to where he would go, as he is unhomed at this time. Strongly recommended to the patient that he stay until the morning to discuss leaving with his primary team.  ?

## 2021-12-15 NOTE — Progress Notes (Signed)
?   12/15/21 0000  ?Assess: MEWS Score  ?Temp 98.5 ?F (36.9 ?C)  ?BP (!) 148/102  ?Pulse Rate (!) 102  ?ECG Heart Rate (!) 102  ?Resp (!) 21  ?SpO2 94 %  ?Assess: MEWS Score  ?MEWS Temp 0  ?MEWS Systolic 0  ?MEWS Pulse 1  ?MEWS RR 1  ?MEWS LOC 0  ?MEWS Score 2  ?MEWS Score Color Yellow  ?Assess: if the MEWS score is Yellow or Red  ?Were vital signs taken at a resting state? No  ?Focused Assessment No change from prior assessment  ?Early Detection of Sepsis Score *See Row Information* Medium  ?MEWS guidelines implemented *See Row Information* No, vital signs rechecked  ?Treat  ?MEWS Interventions Administered prn meds/treatments;Escalated (See documentation below)  ?Pain Scale 0-10  ?Pain Score 0  ?Patients response to intervention Decreased  ?Take Vital Signs  ?Increase Vital Sign Frequency  Yellow: Q 2hr X 2 then Q 4hr X 2, if remains yellow, continue Q 4hrs  ?Escalate  ?MEWS: Escalate Yellow: discuss with charge nurse/RN and consider discussing with provider and RRT  ?Notify: Charge Nurse/RN  ?Name of Charge Nurse/RN Notified April, RN  ?Date Charge Nurse/RN Notified 12/15/21  ?Time Charge Nurse/RN Notified 0010  ?Notify: Provider  ?Provider Name/Title Mcarthur Rossetti, MD  ?Date Provider Notified 12/15/21  ?Time Provider Notified 0100  ?Method of Notification Page  ?Notification Reason Critical result  ?Provider response See new orders  ?Date of Provider Response 12/15/21  ?Time of Provider Response 0105  ?Document  ?Patient Outcome Not stable and remains on department  ? ? ?

## 2021-12-15 NOTE — TOC Initial Note (Signed)
Transition of Care (TOC) - Initial/Assessment Note  ? ? ?Patient Details  ?Name: Derrick Cooper ?MRN: [id number] ?Date of Birth: 07-Dec-1960 ? ?Transition of Care (TOC) CM/SW Contact:    ?Benard Halsted, LCSW ?Phone Number: ?12/15/2021, 4:18 PM ? ?Clinical Narrative:                 ?CSW spoke with patient regarding homeless/substance use consult. Patient stated he has been living on the streets and has been working with a Tourist information centre manager downtown to help get his Medicaid switched over to [facility]. Patient reported that he had been clean while living with his sister but then he started using a lot and has not been able to stop. CSW provided resources for homelessness and substance use treatment though patient states he is incontinent at times and has not been able to be accepted by any programs due to that issue. Patient reported he uses the [facility] for showers and to use their phone. He is aware that Medicaid will provide transportation to medical appointments as he used that in [place], [place]. He requested clothes at discharge and a bus pass; CSW will search the clothes closet and provide bus pass.  ? ? ? ?Expected Discharge Plan: Home/Self Care ?Barriers to Discharge: Continued Medical Work up ? ? ?Patient Goals and CMS Choice ?Patient states their goals for this hospitalization and ongoing recovery are:: Feel better ?  ?  ? ?Expected Discharge Plan and Services ?Expected Discharge Plan: Home/Self Care ?In-house Referral: Clinical Social Work ?Discharge Planning Services: CM Consult ?Post Acute Care Choice: NA ?Living arrangements for the past 2 months: Homeless ?                ?  ?  ?  ?  ?  ?  ?  ?  ?  ?  ? ?Prior Living Arrangements/Services ?Living arrangements for the past 2 months: Homeless ?Lives with:: Self ?Patient language and need for interpreter reviewed:: Yes ?       ?Need for Family Participation in Patient Care: No (Comment) ?Care giver support system in place?: No (comment) ?  ?Criminal  Activity/Legal Involvement Pertinent to Current Situation/Hospitalization: No - Comment as needed ? ?Activities of Daily Living ?Home Assistive Devices/Equipment: None ?ADL Screening (condition at time of admission) ?Patient's cognitive ability adequate to safely complete daily activities?: Yes ?Is the patient deaf or have difficulty hearing?: No ?Does the patient have difficulty seeing, even when wearing glasses/contacts?: Yes ?Does the patient have difficulty concentrating, remembering, or making decisions?: No ?Patient able to express need for assistance with ADLs?: Yes ?Does the patient have difficulty dressing or bathing?: No ?Independently performs ADLs?: Yes (appropriate for developmental age) ?Does the patient have difficulty walking or climbing stairs?: Yes ?Weakness of Legs: Both ? ?Permission Sought/Granted ?  ?  ?   ?   ?   ?   ? ?Emotional Assessment ?Appearance:: Appears stated age ?Attitude/Demeanor/Rapport: Engaged ?Affect (typically observed): Accepting, Appropriate, Quiet ?Orientation: : Oriented to Self, Oriented to Place, Oriented to  Time, Oriented to Situation ?Alcohol / Substance Use: Illicit Drugs ?Psych Involvement: No (comment) ? ?Admission diagnosis:  Alcohol withdrawal ([place]) [F10.939] ?Auditory hallucination [R44.0] ?History of schizophrenia [Z86.59] ?Uncontrolled hypertension [I10] ?Alcohol withdrawal syndrome with complication ([place]) [id number] ?Alcoholic intoxication with complication ([place]) [id number] ?HIV infection, unspecified symptom status ([place]) [B20] ?Community acquired bilateral lower lobe pneumonia [J18.9] ?Patient Active Problem List  ? Diagnosis Date Noted  ? History of schizophrenia   ? Alcohol withdrawal ([place]) 12/14/2021  ?  Onychomycosis 02/18/2021  ? Protein-calorie malnutrition, severe 02/13/2021  ? Thrush   ? Diabetic ketoacidosis ([place]) 02/10/2021  ? Alcohol abuse   ? Fracture of mandible ([place])   ? Acute respiratory failure with hypoxia ([place]) 12/27/2019  ? Lobar pneumonia  ([place]) 12/27/2019  ? Community acquired bilateral lower lobe pneumonia 12/27/2019  ? Polysubstance abuse ([place]) 12/10/2019  ? Homelessness 12/10/2019  ? Substance induced mood disorder ([place]) 12/10/2019  ? MDD (major depressive disorder), recurrent episode, severe ([place]) 08/09/2018  ? Schizoaffective disorder, bipolar type ([place])   ? Health care maintenance 06/26/2018  ? Tobacco use 06/26/2018  ? Furuncle of right axilla 11/13/2017  ? Hyperlipidemia associated with type 2 diabetes mellitus ([place]) 10/25/2017  ? 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 HIV infection ([place]) 09/27/2017  ? Chronic hepatitis C without hepatic coma ([place]) 09/27/2017  ? Acute non-recurrent maxillary sinusitis 09/27/2017  ? Schizophrenia ([place]) 09/07/2017  ? Controlled diabetes mellitus with complication, without long-term current use of insulin ([place]) 09/07/2017  ? Urinary incontinence 09/07/2017  ? ?PCP:  Loyola Mast, PA-C ?Pharmacy:   ?[facility] ?[place] [address] ?Phone: [phone] Fax: [phone] ? ? ? ? ?Social Determinants of Health (SDOH) Interventions ?  ? ?Readmission Risk Interventions ? ?  12/15/2021  ?  4:16 PM 01/03/2020  ?  9:51 AM  ?Readmission Risk Prevention Plan  ?Transportation Screening Complete Complete  ?Medication Review Press photographer) Complete Complete  ?PCP or Specialist appointment within 3-5 days of discharge Complete Complete  ?[place] or Home Care Consult Complete Complete  ?SW Recovery Care/Counseling Consult Complete Complete  ?Palliative Care Screening Not Applicable Not Applicable  ?[place] Not Applicable Not Applicable  ? ? ? ?

## 2021-12-15 NOTE — Progress Notes (Signed)
Initial Nutrition Assessment ? ?DOCUMENTATION CODES:  ? ?Severe malnutrition in context of social or environmental circumstances ? ?INTERVENTION:  ? ?Continue Multivitamin w/ minerals daily ?Ensure Enlive po TID, each supplement provides 350 kcal and 20 grams of protein. ? ?NUTRITION DIAGNOSIS:  ? ?Severe Malnutrition related to social / environmental circumstances as evidenced by severe fat depletion, severe muscle depletion. ? ?GOAL:  ? ?Patient will meet greater than or equal to 90% of their needs  ? ?MONITOR:  ? ?PO intake, Supplement acceptance, Labs, Weight trends ? ?REASON FOR ASSESSMENT:  ? ?Consult ?Assessment of nutrition requirement/status ? ?ASSESSMENT:  ? ?61 y.o. male presented to the ED with shortness of breath. PMH includes HIV, homelessness, T2DM, Hepatitis C, MDD, Schizoaffective disorder, malnutrition, and polysubstance abuse. Pt admitted with CAP and EtOH withdrawal.  ? ?Pt reports that his appetite has been poor, reports that he can only eat a little amount and then her gets full. Pt reports that he has went days without eating PTA and was just drinking alcohol and doing drugs. Pt endorses some nausea, states medicine has helped.  ?Pt reports that he ate <50% of his meal at lunch.  ? ?Pt endorses weight loss but is unsure of how much or what his UBW is. Per EMR, pt weight had been stable and then gained 7 kg; suspect that weight is incorrect.  ? ?Discussed the use of ONS between meals to provide additional calories and protein; pt agreeable. ? ?Medications reviewed and include: Zithromax, Folic Acid, MVI, Thiamine, IV antibiotics  ?Labs reviewed: Sodium 130, Hgb A1c 7% (11/16/21)  ? ?NUTRITION - FOCUSED PHYSICAL EXAM: ? ?Flowsheet Row Most Recent Value  ?Orbital Region Severe depletion  ?Upper Arm Region Severe depletion  ?Thoracic and Lumbar Region Severe depletion  ?Buccal Region Severe depletion  ?Temple Region Severe depletion  ?Clavicle Bone Region Severe depletion  ?Clavicle and Acromion  Bone Region Severe depletion  ?Scapular Bone Region Severe depletion  ?Dorsal Hand Severe depletion  ?Patellar Region Severe depletion  ?Anterior Thigh Region Severe depletion  ?Posterior Calf Region Severe depletion  ?Edema (RD Assessment) None  ?Hair Reviewed  ?Eyes Unable to assess  ?Mouth Reviewed  ?Skin Reviewed  ?Nails Reviewed  ? ?Diet Order:   ?Diet Order   ? ?       ?  Diet regular Room service appropriate? Yes; Fluid consistency: Thin  Diet effective now       ?  ? ?  ?  ? ?  ? ? ?EDUCATION NEEDS:  ? ?No education needs have been identified at this time ? ?Skin:  Skin Assessment: Reviewed RN Assessment ? ?Last BM:  5/15 ? ?Height:  ? ?Ht Readings from Last 1 Encounters:  ?11/25/21 6\' 2"  (1.88 m)  ? ? ?Weight:  ? ?Wt Readings from Last 1 Encounters:  ?12/15/21 62.4 kg  ? ? ?Ideal Body Weight:  86.4 kg ? ?BMI:  Body mass index is 17.66 kg/m?. ? ?Estimated Nutritional Needs:  ? ?Kcal:  1900-2100 ? ?Protein:  95-110 grams ? ?Fluid:  >/= 1.9 L ? ? ? ?12/17/21 RD, LDN ?Clinical Dietitian ?See AMiON for contact information.  ?

## 2021-12-15 NOTE — Progress Notes (Addendum)
? ?Subjective: ? ?Overnight, patient was tachycardic to 140. CIWA was noted to be 11 so he was given 2 mg ativan. Subsequently he was still tachycardic to 120s-140s. EKG with sinus tachycardia. Telemetry notable for intermittent hypoxemia <88% so a repeat CXR was obtained and stable. Troponin 18. D-dimer elevated so CTA was obtained and negative for PE.  ? ?This morning patient reports doing well overall. He does still have some shortness of breath but stable from yesterday. Denies chest pain, abdominal pain, nausea, or emesis.  ? ?Objective: ? ?Vital signs in last 24 hours: ?Vitals:  ? 12/15/21 0600 12/15/21 0754 12/15/21 0800 12/15/21 1203  ?BP: (!) 148/93 (!) 142/92 (!) 146/96 139/89  ?Pulse: (!) 116 100 (!) 110 100  ?Resp: 19 17 18 18   ?Temp: 98.4 ?F (36.9 ?C) 98.4 ?F (36.9 ?C)  98.6 ?F (37 ?C)  ?TempSrc: Oral Oral  Oral  ?SpO2: 93% 94% 94% 92%  ? ?Weight change:  ? ?Intake/Output Summary (Last 24 hours) at 12/15/2021 1438 ?Last data filed at 12/15/2021 0456 ?Gross per 24 hour  ?Intake 955.39 ml  ?Output 1200 ml  ?Net -244.61 ml  ? ?General: Thin, malnourished appearing male. Lying in bed without any acute distress.  ?Neuro: A&O x3. Answers questions appropriately.  ?HEENT: Normocephalic, atraumatic. Right conjunctiva non-icteric and non-injected. Left eye surgically absent, covered with eye patch. Moist mucous membranes. Tongue with thrush. ?Cardiovascular: Tachycardic but no murmurs. Pulses 2+ in bilateral UE and LE. No peripheral edema ?Pulmonary: Breathing comfortably on room air. Mildly coarse breath sounds bilaterally but no obvious wheezing or crackles.  ?Abdominal: Abdomen soft and non-distended. Normoactive bowel sounds. No tenderness to palpation.  ?Skin: Warm and dry with no rashes, cuts, or bruises ?MSK: Spontaneous ROM of all extremities.  ? ? ?Assessment/Plan: ? ?Principal Problem: ?  Alcohol withdrawal (HCC) ?Active Problems: ?  HIV infection (HCC) ?  Community acquired bilateral lower lobe  pneumonia ?  History of schizophrenia ? ?This is a 61 year old male with history of HIV, hepatitis C, schizophrenia, HLD, T2DM, tobacco use disorder, MDD, schizoaffective disorder, and polysubstance use who presents with shortness of breath, now found to have RUL CAP.  ? ?Community acquired pneumonia ?Immunocompromised patient experiencing homelessness presents with new onset shortness of breath, congestion, and cough. CXR notable for RUL mass. Chest CT with nodular airspace disease most prominent in RUL but also noted in superior segments of RLL and LLL. Strep pneumoniae and legionella antigen negative. Symptoms most consistent with community acquired pneumonia (viral vs bacterial) and will treat with broad spectrum antibiotics but given that patient is high risk for opportunistic / fungal / MDR organisms as well as TB, we will monitor closely and make medication changes if not clinically improving.   ?- ID following ?- Continue azithromycin and ceftriaxone for 5 days per ID recommendation (end date 5/21) ?- Pain control with tylenol  ?- O2 supplementation as needed  ?- Sputum culture pending  ?- Trend CBC and fever curve ?  ?Alcohol withdrawal  ?Tobacco use  ?Polysubstance use  ?Patient with history of multiple ED visits related to alcohol use presents one day after using alcohol, cocaine, and methamphetamine. Ethanol 302. Acetaminophen and salicylates low. AST 80, ALT 64. Vitals initially within normal limits but subsequently became hypertensive and tachycardic. Symptoms most likely secondary to alcohol withdrawal. Most recent CIWA score 0 and patient continues to be tachycardic but likely secondary to acute infection. Clinically improving overall.  ?- UDS pending  ?- Continue CIWA protocol ?-  Thiamine, folate and MVI ?- Telemetry ?- Vitamin B1 level pending ?  ?HIV ?Patient with chronic HIV and off all medications for the past 3 months. CD4 count on admission 194.  ?- ID following, appreciate recs ?- Viral load  pending  ?- Hold off on restarting antiretroviral therapy in the setting of active infection  ?- Patient is scheduled to follow-up with [facility] on 5/25 ?  ?Thrombocytopenia ?Patient with new thrombocytopenia to 51 while admitted. Other cell lines unaffected. Normal platelet morphology on smear. Possibly ITP or drug induced immune thrombocytopenia in the setting of tylenol use. ?- Recheck in AM ?- Continue to monitor ? ?T2DM ?Patient with history of well controlled T2DM (A1c 7.0 in April 2023) presents after being off all medications for the past three months. Glucose 74 on admission.  ?- CBG monitoring  ?- 1L NS ?- Consider restarting metformin once stabilized from pneumonia ?  ?HLD ?Patient with history of hyperlipidemia and not on statin due to poor access to healthcare. Lipid panel with total cholesterol 232 and LDL 118.  ?- Statin therapy indicated given patient >44 years of age with known diabetes, follow up outpatient  ?  ?Schizophrenia ?Major depressive disorder ?Schizoaffective disorder ?- Continue quetiapine 100 mg daily ?- Plan to reconsult psych when medically stable ? ?Social determinants of health ?Homelessness ?Patient currently unhomed. Status and situation patient significant barrier to receiving appropriate medical care.  Patient also in need of transportation in order to help with healthcare access.  ?-- TOC consulted, appreciate assistance ? ? ? LOS: 1 day  ? ?Jim Like, Medical Student ?12/15/2021, 2:38 PM ? ?I have reviewed the note by Sharrell Ku MS 4 and was present during the interview and physical exam.  I agree with the findings, assessment, and plan. ? ?Steffanie Rainwater, MD ?12/15/2021, 3:09 PM ?IM Resident, PGY-2 ?Pager: [phone] ?Isaiah 41:10 ? ?

## 2021-12-15 NOTE — Progress Notes (Addendum)
INTERVAL PROGRESS NOTE: ? ?Paged by RN regarding elevated HR initially to 140s. Patient noted to have elevated CIWA score to 11 as his last Ativan dosing was at 11:56AM of 2mg . Patient received 2mg  Ativan at 23:13 with improvement in CIWA score to 1. However, remained tachycardic with HR 120-140s. EKG obtained that demonstrated sinus tachycardia with persistent ST and T wave changes in leads II, III, aVF and V5-6 with more pronounced ST depression in V5 when compared to prior EKG. Telemetry reviewed that was significant for sinus tachycardia with intermittent episodes of hypoxia with SpO2 recorded <88%. Patient evaluated at bedside. He is sleeping and difficult to arouse but did respond to sternal rub. Cardiopulmonary exam only remarkable for sinus tachycardia without murmurs/rubs/gallops and no significant wheezing, rales or rhonchi on auscultation of anterior chest noted.  ?Patient is admitted for community acquired pneumonia in setting of history of HIV and hepatitis C and polysubstance use with most recent CD4 count <200, concerning for opportunistic infections. He received doxcycline, azithromycin and ceftriaxone for CAP coverage and remains afebrile. However, given intermittent hypoxic episodes, concern for possible worsening pneumonia - will obtain repeat CXR. Alcohol withdrawal less likely given low CIWA score. UDS is pending for other recreational drugs. Given EKG findings, will obtain repeat troponin as well.  ? ?Plan: ?- Repeat CXR ?- Repeat Troponin ?- Continue telemetry  ?- If unchanged CXR and tachycardia and tachypnea persistent, will obtain D-dimer although low suspicion for PE at this time ?- F/u UDS results ?- Continue CIWA w/Ativan  ?- Trend fever curve ? ?ADDENDUM: ?Repeat CXR without worsening infection. D-dimer obtained that is elevated to 2.63. Patient is clinically unchanged and remains tachycardic on telemetry. Will obtain CTA Chest - PE  ? ? ? , MD ?Internal Medicine,  PGY-3 ?12/15/21 3:18 AM ?Pager # [phone] ? ? ?

## 2021-12-16 DIAGNOSIS — F10939 Alcohol use, unspecified with withdrawal, unspecified: Secondary | ICD-10-CM | POA: Diagnosis not present

## 2021-12-16 DIAGNOSIS — J189 Pneumonia, unspecified organism: Secondary | ICD-10-CM | POA: Diagnosis not present

## 2021-12-16 DIAGNOSIS — Z8659 Personal history of other mental and behavioral disorders: Secondary | ICD-10-CM | POA: Diagnosis not present

## 2021-12-16 DIAGNOSIS — B20 Human immunodeficiency virus [HIV] disease: Secondary | ICD-10-CM | POA: Diagnosis not present

## 2021-12-16 LAB — CBC
HCT: 40.3 % (ref 39.0–52.0)
Hemoglobin: 14.2 g/dL (ref 13.0–17.0)
MCH: 32 pg (ref 26.0–34.0)
MCHC: 35.2 g/dL (ref 30.0–36.0)
MCV: 90.8 fL (ref 80.0–100.0)
Platelets: 68 10*3/uL — ABNORMAL LOW (ref 150–400)
RBC: 4.44 MIL/uL (ref 4.22–5.81)
RDW: 13.6 % (ref 11.5–15.5)
WBC: 3.6 10*3/uL — ABNORMAL LOW (ref 4.0–10.5)
nRBC: 0 % (ref 0.0–0.2)

## 2021-12-16 LAB — BASIC METABOLIC PANEL
Anion gap: 11 (ref 5–15)
BUN: 8 mg/dL (ref 6–20)
CO2: 24 mmol/L (ref 22–32)
Calcium: 9 mg/dL (ref 8.9–10.3)
Chloride: 101 mmol/L (ref 98–111)
Creatinine, Ser: 0.64 mg/dL (ref 0.61–1.24)
GFR, Estimated: >60 mL/min (ref >60)
Glucose, Bld: 118 mg/dL — ABNORMAL HIGH (ref 70–99)
Potassium: 3.1 mmol/L — ABNORMAL LOW (ref 3.5–5.1)
Sodium: 136 mmol/L (ref 135–145)

## 2021-12-16 LAB — PATHOLOGIST SMEAR REVIEW

## 2021-12-16 LAB — MAGNESIUM: Magnesium: 1.8 mg/dL (ref 1.7–2.4)

## 2021-12-16 LAB — VITAMIN B1: Vitamin B1 (Thiamine): 120.6 nmol/L (ref 66.5–200.0)

## 2021-12-16 LAB — RPR: RPR Ser Ql: NONREACTIVE

## 2021-12-16 MED ORDER — AMOXICILLIN-POT CLAVULANATE 875-125 MG PO TABS
1.0000 | ORAL_TABLET | Freq: Two times a day (BID) | ORAL | Status: DC
  Administered 2021-12-16 – 2021-12-17 (×3): 1 via ORAL
  Filled 2021-12-16 (×3): qty 1

## 2021-12-16 MED ORDER — METFORMIN HCL 500 MG PO TABS
500.0000 mg | ORAL_TABLET | Freq: Two times a day (BID) | ORAL | Status: DC
  Administered 2021-12-17: 500 mg via ORAL
  Filled 2021-12-16: qty 1

## 2021-12-16 MED ORDER — POTASSIUM CHLORIDE 20 MEQ PO PACK
40.0000 meq | PACK | Freq: Two times a day (BID) | ORAL | Status: DC
  Administered 2021-12-16 (×2): 40 meq via ORAL
  Filled 2021-12-16 (×2): qty 2

## 2021-12-16 MED ORDER — QUETIAPINE FUMARATE 100 MG PO TABS
200.0000 mg | ORAL_TABLET | Freq: Every day | ORAL | Status: DC
  Administered 2021-12-16: 200 mg via ORAL
  Filled 2021-12-16: qty 2

## 2021-12-16 MED ORDER — ATORVASTATIN CALCIUM 40 MG PO TABS
40.0000 mg | ORAL_TABLET | Freq: Every day | ORAL | Status: DC
  Administered 2021-12-17: 40 mg via ORAL
  Filled 2021-12-16: qty 1

## 2021-12-16 NOTE — Progress Notes (Shared)
Subjective:  ***  Objective:  Vital signs in last 24 hours: Vitals:   12/15/21 1634 12/15/21 1927 12/16/21 0739 12/16/21 1152  BP: (!) 153/101 (!) 155/102 124/78 121/84  Pulse: 100 97 90 (!) 102  Resp: 18 19 18 18   Temp: 98.8 F (37.1 C) 98.6 F (37 C) 97.9 F (36.6 C) 98.3 F (36.8 C)  TempSrc: Oral Oral Oral Oral  SpO2: 95% 95% 94% 94%  Weight:       Weight change:   Intake/Output Summary (Last 24 hours) at 12/16/2021 1524 Last data filed at 12/15/2021 1738 Gross per 24 hour  Intake 501.64 ml  Output 900 ml  Net -398.36 ml   General: Chronically ill appearing without any acute distress.  Neuro: A&O x3. Answers questions appropriately.  HEENT: Normocephalic, atraumatic. Right conjunctiva non-icteric and non-injected. Left eye surgically absent, covered with eye patch. Moist mucous membranes. Tongue with thrush. Cardiovascular: Regular rate and rhythm, no murmurs. Pulses 2+ in bilateral UE and LE. No peripheral edema Pulmonary: Breathing comfortably on room air. Mildly coarse breath sounds bilaterally but no obvious wheezing or crackles.  Abdominal: Abdomen soft and non-distended. Normoactive bowel sounds. No tenderness to palpation.  Skin: Warm and dry with no rashes, cuts, or bruises MSK: Spontaneous ROM of all extremities.    Assessment/Plan:  Principal Problem:   Alcohol withdrawal (HCC) Active Problems:   HIV infection (HCC)   Community acquired bilateral lower lobe pneumonia   History of schizophrenia   61 year old male with history of uncontrolled HIV, hepatitis C, schizophrenia, HLD, T2DM, tobacco use disorder, MDD, schizoaffective disorder, and polysubstance use who was admitted for management of community acquired pneumonia and alcohol withdrawal.    Community acquired pneumonia Immunocompromised patient experiencing homelessness presented with new onset shortness of breath, congestion, and cough. CXR notable for RUL mass. Chest CT with nodular  airspace disease most prominent in RUL but also noted in superior segments of RLL and LLL. Strep pneumoniae and legionella antigen negative. Symptoms most consistent with community acquired pneumonia. Patient experienced clinical improvement with antibiotic therapy so lower concern for MDR or fungal pathogens. TB considered given multiple risk factors but ultimately felt to be unlikely given acute presentation and significant improvement.  - ID following  - PO augmentin and azithromycin (end date 5/21) - PRN tylenol  - Sputum culture pending  - Trend CBC and fever curve   Alcohol withdrawal Patient with history of polysubstance use and multiple ED visits related to alcohol use presented one day after using alcohol, cocaine, and methamphetamine. On admission, ethanol 302, acetaminophen and salicylates low,  AST 80, ALT 64. UDS not processed. Vitals initially within normal limits but subsequently became hypertensive and tachycardic, consistent with alcohol withdrawal. Telemetry notable only for sinus tachycardia.  - UDS pending  - Continue CIWA protocol - Thiamine, folate and MVI - Telemetry - Vitamin B1 level pending   HIV Patient with chronic HIV presented after being off all antiretroviral therapy for 3 months. CD4 count on admission 194, viral load 42800. ART was not restarted while admitted due to social / housing instability and patient will require outpatient follow up with ID. - ID following  - Hold off on restarting antiretroviral therapy in the setting of active infection  - Patient is scheduled to follow-up with [facility] on 5/25   Isolated thrombocytopenia Patient with new thrombocytopenia while admitted. Platelet count up-trending from 51 to 68. No active bleeding. Normal platelet morphology on smear. Differential includes ITP or drug induced  immune thrombocytopenia in the setting of tylenol use. - Recheck in AM - Continue to monitor   T2DM Patient with history of well controlled  T2DM (A1c 7.0 in April 2023) presented after being off all medications for the past three months. Glucose <120 while admitted.   - Restart metformin  - S/p 1L NS   HLD Patient with history of hyperlipidemia and not on statin due to poor access to healthcare. Lipid panel with total cholesterol 232 and LDL 118.  - Atorvastatin 40 mg    Schizophrenia Major depressive disorder Schizoaffective disorder Homelessness Patient experiencing homelessness prior to admission was psychiatrically stable while admitted and denied SI and HI.  - Increase quetiapine to 200 mg  - Psychiatry following  - TOC consulted   LOS: 2 days   Jim Like, Medical Student 12/16/2021, 3:24 PM

## 2021-12-16 NOTE — Progress Notes (Signed)
Subjective:  Overnight patient attempted to leave AMA multiple times. Initially he reported wanting to leave to drink alcohol and later was concerned because he was not able to locate his wallet and belongings. Ultimately he was redirectable with warm blankets. This morning, he is still concerned about his belongings and will not be able to find housing without his identification. From a respiratory standpoint he reports still having some trouble breathing but overall is improved.   Objective:  Vital signs in last 24 hours: Vitals:   12/15/21 1634 12/15/21 1927 12/16/21 0739 12/16/21 1152  BP: (!) 153/101 (!) 155/102 124/78 121/84  Pulse: 100 97 90 (!) 102  Resp: 18 19 18 18   Temp: 98.8 F (37.1 C) 98.6 F (37 C) 97.9 F (36.6 C) 98.3 F (36.8 C)  TempSrc: Oral Oral Oral Oral  SpO2: 95% 95% 94% 94%  Weight:       Weight change:   Intake/Output Summary (Last 24 hours) at 12/16/2021 1358 Last data filed at 12/15/2021 1738 Gross per 24 hour  Intake 1274.71 ml  Output 900 ml  Net 374.71 ml   General: Chronically ill appearing without any acute distress.  Neuro: A&O x3. Answers questions appropriately.  HEENT: Normocephalic, atraumatic. Right conjunctiva non-icteric and non-injected. Left eye surgically absent, covered with eye patch. Moist mucous membranes. Tongue with thrush. Cardiovascular: Regular rate and rhythm, no murmurs. Pulses 2+ in bilateral UE and LE. No peripheral edema Pulmonary: Breathing comfortably on room air. Mildly coarse breath sounds bilaterally but no obvious wheezing or crackles.  Abdominal: Abdomen soft and non-distended. Normoactive bowel sounds. No tenderness to palpation.  Skin: Warm and dry with no rashes, cuts, or bruises MSK: Spontaneous ROM of all extremities.    Assessment/Plan:  Principal Problem:   Alcohol withdrawal (HCC) Active Problems:   HIV infection (HCC)   Community acquired bilateral lower lobe pneumonia   History of  schizophrenia  61 year old male with history of uncontrolled HIV, hepatitis C, schizophrenia, HLD, T2DM, tobacco use disorder, MDD, schizoaffective disorder, and polysubstance use who was admitted for management of community acquired pneumonia and alcohol withdrawal.   Community acquired pneumonia Immunocompromised patient experiencing homelessness presented with new onset shortness of breath, congestion, and cough. CXR notable for RUL mass. Chest CT with nodular airspace disease most prominent in RUL but also noted in superior segments of RLL and LLL. Strep pneumoniae and legionella antigen negative. Symptoms most consistent with community acquired pneumonia. Patient experienced clinical improvement with antibiotic therapy so lower concern for MDR or fungal pathogens. TB considered given multiple risk factors but ultimately felt to be unlikely given acute presentation and significant improvement.  - ID following  - PO augmentin and azithromycin (end date 5/21) - PRN tylenol  - Sputum culture pending  - Trend CBC and fever curve   Alcohol withdrawal Patient with history of polysubstance use and multiple ED visits related to alcohol use presented one day after using alcohol, cocaine, and methamphetamine. On admission, ethanol 302, acetaminophen and salicylates low,  AST 80, ALT 64. UDS not processed. Vitals initially within normal limits but subsequently became hypertensive and tachycardic, consistent with alcohol withdrawal. Telemetry notable only for sinus tachycardia.  - UDS pending  - Continue CIWA protocol - Thiamine, folate and MVI - Telemetry - Vitamin B1 level pending   HIV Patient with chronic HIV presented after being off all antiretroviral therapy for 3 months. CD4 count on admission 194, viral load 42800. ART was not restarted while admitted due to  social / housing instability and patient will require outpatient follow up with ID. - ID following  - Hold off on restarting  antiretroviral therapy in the setting of active infection  - Patient is scheduled to follow-up with [facility] on 5/25  Isolated thrombocytopenia Patient with new thrombocytopenia while admitted. Platelet count up-trending from 51 to 68. No active bleeding. Normal platelet morphology on smear. Differential includes ITP or drug induced immune thrombocytopenia in the setting of tylenol use. - Recheck in AM - Continue to monitor   T2DM Patient with history of well controlled T2DM (A1c 7.0 in April 2023) presented after being off all medications for the past three months. Glucose <120 while admitted.   - Restart metformin  - S/p 1L NS   HLD Patient with history of hyperlipidemia and not on statin due to poor access to healthcare. Lipid panel with total cholesterol 232 and LDL 118.  - Atorvastatin 40 mg    Schizophrenia Major depressive disorder Schizoaffective disorder Homelessness Patient experiencing homelessness prior to admission was psychiatrically stable while admitted and denied SI and HI.  - Increase quetiapine to 200 mg  - Psychiatry following  - TOC consulted    LOS: 2 days   Jim Like, Medical Student 12/16/2021, 1:58 PM

## 2021-12-16 NOTE — Hospital Course (Addendum)
61 year old male with history of uncontrolled HIV, hepatitis C, schizophrenia, HLD, T2DM, tobacco use disorder, MDD, schizoaffective disorder, and polysubstance use who was admitted for management of community acquired pneumonia and alcohol withdrawal.   Community acquired pneumonia Immunocompromised patient experiencing homelessness presented with new onset shortness of breath, congestion, and cough. CXR notable for RUL mass. Chest CT with nodular airspace disease most prominent in RUL but also noted in superior segments of RLL and LLL. Strep pneumoniae and legionella antigen negative. Symptoms most consistent with community acquired pneumonia. Patient experienced clinical improvement with antibiotic therapy so lower concern for MDR or fungal pathogens. TB considered given multiple risk factors but ultimately felt to be unlikely given acute presentation and significant improvement. He is being discharged on augmentin and azithromycin (end date 5/21). Pain was adequately controlled with tylenol.    Alcohol withdrawal Patient with history of polysubstance use and multiple ED visits related to alcohol use presented one day after using alcohol, cocaine, and methamphetamine. On admission, ethanol 302, acetaminophen and salicylates low,  AST 80, ALT 64. UDS not processed. Vitals initially within normal limits but subsequently became hypertensive and tachycardic, consistent with alcohol withdrawal. Telemetry notable only for sinus tachycardia. He was treated per CIWA protocol and did not require any ativan within 24 hours of discharge. He was also given thiamine, folate, and multivitamin.    HIV Patient with chronic HIV presented after being off all antiretroviral therapy for 3 months. CD4 count on admission 194, viral load 42800. ART was not restarted while admitted due to social / housing instability and patient will require outpatient follow up with ID on 5/25.    Isolated thrombocytopenia Patient with new  thrombocytopenia while admitted. Platelet count up-trending from 51 to 68 at the time of discharge. No active bleeding. Normal platelet morphology on smear. Differential includes ITP or drug induced immune thrombocytopenia in the setting of tylenol use. Patient will require close outpatient monitoring.    T2DM Patient with history of well controlled T2DM (A1c 7.0 in April 2023) presented after being off all medications for the past three months. Glucose <120 while admitted.  He was given 1L NS while admitted and did not require any medications. He is being discharged on his home metformin.    HLD Patient with history of hyperlipidemia and not on statin due to poor access to healthcare. Lipid panel with total cholesterol 232 and LDL 118. He was started on high intensity stain while admitted.    Schizophrenia Major depressive disorder Schizoaffective disorder Homelessness Patient experiencing homelessness prior to admission was psychiatrically stable while admitted and denied SI and HI. His home quetiapine was increased and he did not require any other medications. Psychiatry was consulted for management and social work provided housing options on discharge.    Thompson Caul, MS4 -------------------------------------------- Subjective:  No acute events overnight and no complaints this morning. Denies shortness of breath, chest pain, abdominal discomfort, nausea, or vomiting. Tolerating PO and ambulating without assistance.  ---------------------------------------------- Objective:   General: Chronically ill appearing without any acute distress.  Neuro: A&O x3. Answers questions appropriately.  HEENT: Normocephalic, atraumatic. Right conjunctiva non-icteric and non-injected. Left eye surgically absent, covered with eye patch.  Cardiovascular: Regular rate and rhythm, no murmurs. Pulses 2+ in bilateral UE and LE. No peripheral edema Pulmonary: Breathing comfortably on room air. Lungs CTAB  without wheezing or crackles. Abdominal: Abdomen soft and non-distended. Normoactive bowel sounds. No tenderness to palpation.  Skin: Warm and dry with no rashes, cuts, or  bruises MSK: Spontaneous ROM of all extremities.

## 2021-12-16 NOTE — Progress Notes (Signed)
Id brief note  Patient improving   A/p Hiv/aids CAP Homeless Substance abuse Left eye blind   -case management/sw aware of situation and assisting in living arrangement, transportation to follow up clinic -finish abx on 5/21; PO route would be ok -no ART at this time due to social/living instability, until ID clinic f/u reevaluation -would also defer prophy for pjp at this time for same reason as above -[facility] f/u on 5/25 @ 145 with Tammy Sours -pending genotype/hiv viral load, fta (rpr negative), hepatitis testing; we can f/u on these labs and address on ID clinic f/u -will sign off for now

## 2021-12-16 NOTE — Discharge Instructions (Addendum)
Derrick Cooper, Derrick Cooper were admitted for pneumonia and alcohol withdrawal. Please continue taking your antibiotics and follow up with your doctors for ongoing medical care.
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
## 2021-12-16 NOTE — Plan of Care (Signed)
  Problem: Activity: Goal: Ability to tolerate increased activity will improve Outcome: Progressing   Problem: Clinical Measurements: Goal: Ability to maintain a body temperature in the normal range will improve Outcome: Progressing   Problem: Respiratory: Goal: Ability to maintain adequate ventilation will improve Outcome: Progressing Goal: Ability to maintain a clear airway will improve Outcome: Progressing   

## 2021-12-17 ENCOUNTER — Other Ambulatory Visit (HOSPITAL_COMMUNITY): Payer: Self-pay

## 2021-12-17 DIAGNOSIS — J189 Pneumonia, unspecified organism: Secondary | ICD-10-CM | POA: Diagnosis not present

## 2021-12-17 DIAGNOSIS — R440 Auditory hallucinations: Secondary | ICD-10-CM

## 2021-12-17 DIAGNOSIS — F10939 Alcohol use, unspecified with withdrawal, unspecified: Secondary | ICD-10-CM | POA: Diagnosis not present

## 2021-12-17 DIAGNOSIS — Z8659 Personal history of other mental and behavioral disorders: Secondary | ICD-10-CM | POA: Diagnosis not present

## 2021-12-17 LAB — CBC
HCT: 37.7 % — ABNORMAL LOW (ref 39.0–52.0)
Hemoglobin: 13.4 g/dL (ref 13.0–17.0)
MCH: 32 pg (ref 26.0–34.0)
MCHC: 35.5 g/dL (ref 30.0–36.0)
MCV: 90 fL (ref 80.0–100.0)
Platelets: 76 10*3/uL — ABNORMAL LOW (ref 150–400)
RBC: 4.19 MIL/uL — ABNORMAL LOW (ref 4.22–5.81)
RDW: 13.4 % (ref 11.5–15.5)
WBC: 3.1 10*3/uL — ABNORMAL LOW (ref 4.0–10.5)
nRBC: 0 % (ref 0.0–0.2)

## 2021-12-17 LAB — FLUORESCENT TREPONEMAL AB(FTA)-IGG-BLD: Fluorescent Treponemal Ab, IgG: REACTIVE — AB

## 2021-12-17 LAB — BASIC METABOLIC PANEL
Anion gap: 8 (ref 5–15)
BUN: 11 mg/dL (ref 6–20)
CO2: 24 mmol/L (ref 22–32)
Calcium: 9.2 mg/dL (ref 8.9–10.3)
Chloride: 104 mmol/L (ref 98–111)
Creatinine, Ser: 0.59 mg/dL — ABNORMAL LOW (ref 0.61–1.24)
GFR, Estimated: >60 mL/min (ref >60)
Glucose, Bld: 167 mg/dL — ABNORMAL HIGH (ref 70–99)
Potassium: 3.2 mmol/L — ABNORMAL LOW (ref 3.5–5.1)
Sodium: 136 mmol/L (ref 135–145)

## 2021-12-17 LAB — HCV RNA QUANT RFLX ULTRA OR GENOTYP
HCV RNA Qnt(log copy/mL): UNDETERMINED log10 IU/mL
HepC Qn: NOT DETECTED IU/mL

## 2021-12-17 LAB — PHOSPHORUS: Phosphorus: 4.4 mg/dL (ref 2.5–4.6)

## 2021-12-17 LAB — HEPATITIS B DNA, ULTRAQUANTITATIVE, PCR
HBV DNA SERPL PCR-ACNC: NOT DETECTED IU/mL
HBV DNA SERPL PCR-LOG IU: UNDETERMINED log10 IU/mL

## 2021-12-17 LAB — HEPATITIS B SURFACE ANTIBODY, QUANTITATIVE: Hep B S AB Quant (Post): <3.1 m[IU]/mL — ABNORMAL LOW (ref >9.9)

## 2021-12-17 LAB — MAGNESIUM: Magnesium: 1.6 mg/dL — ABNORMAL LOW (ref 1.7–2.4)

## 2021-12-17 MED ORDER — MAGNESIUM SULFATE 2 GM/50ML IV SOLN
2.0000 g | Freq: Once | INTRAVENOUS | Status: DC
  Filled 2021-12-17: qty 50

## 2021-12-17 MED ORDER — AMOXICILLIN-POT CLAVULANATE 875-125 MG PO TABS
1.0000 | ORAL_TABLET | Freq: Two times a day (BID) | ORAL | 0 refills | Status: AC
  Filled 2021-12-17: qty 4, 2d supply, fill #0

## 2021-12-17 MED ORDER — POTASSIUM CHLORIDE 20 MEQ PO PACK: 80.0000 meq | PACK | Freq: Once | ORAL | Status: DC

## 2021-12-17 MED ORDER — ATORVASTATIN CALCIUM 40 MG PO TABS
40.0000 mg | ORAL_TABLET | Freq: Every day | ORAL | 0 refills | Status: AC
  Filled 2021-12-17: qty 30, 30d supply, fill #0

## 2021-12-17 MED ORDER — METFORMIN HCL 500 MG PO TABS
500.0000 mg | ORAL_TABLET | Freq: Two times a day (BID) | ORAL | 0 refills | Status: AC
  Filled 2021-12-17: qty 60, 30d supply, fill #0

## 2021-12-17 MED ORDER — QUETIAPINE FUMARATE 200 MG PO TABS
200.0000 mg | ORAL_TABLET | Freq: Every day | ORAL | 0 refills | Status: AC
Start: 2021-12-17 — End: ?
  Filled 2021-12-17: qty 30, 30d supply, fill #0

## 2021-12-17 MED ORDER — AZITHROMYCIN 250 MG PO TABS
250.0000 mg | ORAL_TABLET | Freq: Every day | ORAL | 0 refills | Status: AC
  Filled 2021-12-17: qty 1, 1d supply, fill #0

## 2021-12-17 MED ORDER — POTASSIUM CHLORIDE 20 MEQ PO PACK
40.0000 meq | PACK | Freq: Two times a day (BID) | ORAL | Status: DC
  Administered 2021-12-17: 40 meq via ORAL
  Filled 2021-12-17: qty 2

## 2021-12-17 NOTE — Discharge Summary (Signed)
Name: Derrick Cooper MRN: [id number] DOB: Jul 21, 1961 61 y.o. PCP: Annye English  Date of Admission: 12/13/2021  7:32 PM Date of Discharge: 12/17/2021 12:10 PM Attending Physician: Randalyn Rhea, MD  Discharge Diagnosis: 1. Alcohol withdrawal  Discharge Medications: Allergies as of 12/17/2021   No Known Allergies      Medication List     STOP taking these medications    Accu-Chek Guide test strip Generic drug: glucose blood   Accu-Chek Guide w/Device Kit   Accu-Chek Softclix Lancets lancets   B-D GLUCOSE 5 g chewable tablet Generic drug: glucose   Lantus SoloStar 100 UNIT/ML Solostar Pen Generic drug: insulin glargine   NovoLOG FlexPen 100 UNIT/ML FlexPen Generic drug: insulin aspart   Pentips 32G X 4 MM Misc Generic drug: Insulin Pen Needle       TAKE these medications    amoxicillin-clavulanate 875-125 MG tablet Commonly known as: AUGMENTIN Take 1 tablet by mouth every 12 (twelve) hours for 2 days.   atorvastatin 40 MG tablet Commonly known as: LIPITOR Take 1 tablet (40 mg total) by mouth daily.   azithromycin 250 MG tablet Commonly known as: ZITHROMAX Take 1 tablet (250 mg total) by mouth daily for 1 day.   dolutegravir 50 MG tablet Commonly known as: TIVICAY Take 1 tablet (50 mg total) by mouth daily.   emtricitabine-tenofovir AF 200-25 MG tablet Commonly known as: DESCOVY Take 1 tablet by mouth daily.   metFORMIN 500 MG tablet Commonly known as: GLUCOPHAGE Take 1 tablet (500 mg total) by mouth 2 (two) times daily with a meal.   QUEtiapine 200 MG tablet Commonly known as: SEROQUEL Take 1 tablet (200 mg total) by mouth at bedtime. What changed:  medication strength how much to take        Disposition and follow-up:   Derrick Cooper was discharged from [facility] in Stable condition.  At the hospital follow up visit please address:  1.  At follow up please follow up on resolution of community acquired  pneumonia. Patient should be seen on 5/25 by ID to discuss ART.   2.  Labs / imaging needed at time of follow-up: na  3.  Pending labs/ test needing follow-up: na  Follow-up Appointments:  Follow-up [place]. Call.   Why: To check to see if you are able to receive rides through your Medicaid. Contact information: [place]. Go to.   Specialty: Urgent Care Why: For help stopping alcohol use Contact information: [place]. Call.   Why: Call on Monday to set up an intake for a shelter bed. Contact information: [place], [place],  [address] [phone]        Loyola Mast, PA-C. Schedule an appointment as soon as possible for a visit in 1 week(s).   Specialty: Physician Assistant Contact information: [place] [address] [phone]                 Hospital Course by problem list:  61 year old male with history of uncontrolled HIV, hepatitis C, schizophrenia, HLD, T2DM, tobacco use disorder, MDD, schizoaffective disorder, and polysubstance use who was admitted for management of community acquired pneumonia and alcohol withdrawal.   Community acquired pneumonia Immunocompromised patient experiencing  homelessness presented with new onset shortness of breath, congestion, and cough. CXR notable for RUL mass. Chest CT with nodular airspace disease most prominent in RUL but also noted in superior segments of RLL and LLL. Strep pneumoniae and legionella antigen negative. Symptoms most consistent with community acquired pneumonia. Patient experienced clinical improvement with antibiotic therapy so lower concern for MDR or fungal pathogens. TB considered given multiple risk factors but ultimately felt to be unlikely given acute presentation  and significant improvement. He is being discharged on augmentin and azithromycin (end date 5/21). Pain was adequately controlled with tylenol.    Alcohol withdrawal Patient with history of polysubstance use and multiple ED visits related to alcohol use presented one day after using alcohol, cocaine, and methamphetamine. On admission, ethanol 302, acetaminophen and salicylates low,  AST 80, ALT 64. UDS not processed. Vitals initially within normal limits but subsequently became hypertensive and tachycardic, consistent with alcohol withdrawal. Telemetry notable only for sinus tachycardia. He was treated per CIWA protocol and did not require any ativan within 24 hours of discharge. He was also given thiamine, folate, and multivitamin.    HIV Patient with chronic HIV presented after being off all antiretroviral therapy for 3 months. CD4 count on admission 194, viral load 42800. ART was not restarted while admitted due to social / housing instability and patient will require outpatient follow up with ID on 5/25.    Isolated thrombocytopenia Patient with new thrombocytopenia while admitted. Platelet count up-trending from 51 to 68 at the time of discharge. No active bleeding. Normal platelet morphology on smear. Differential includes ITP or drug induced immune thrombocytopenia in the setting of tylenol use. Patient will require close outpatient monitoring.    T2DM Patient with history of well controlled T2DM (A1c 7.0 in April 2023) presented after being off all medications for the past three months. Glucose <120 while admitted.  He was given 1L NS while admitted and did not require any medications. He is being discharged on his home metformin.    HLD Patient with history of hyperlipidemia and not on statin due to poor access to healthcare. Lipid panel with total cholesterol 232 and LDL 118. He was started on high intensity stain while admitted.    Schizophrenia Major depressive disorder Schizoaffective  disorder Homelessness Patient experiencing homelessness prior to admission was psychiatrically stable while admitted and denied SI and HI. His home quetiapine was increased and he did not require any other medications. Psychiatry was consulted for management and social work provided housing options on discharge.    Subjective:  No acute events overnight and no complaints this morning. Denies shortness of breath, chest pain, abdominal discomfort, nausea, or vomiting. Tolerating PO and ambulating without assistance.  ---------------------------------------------- Discharge Exam:   BP 121/90 (BP Location: Right Arm)   Pulse 80   Temp 98.2 F (36.8 C) (Oral)   Resp 20   Wt 62.4 kg Comment: Bedscale  SpO2 96%   BMI 17.66 kg/m   General: Chronically ill appearing without any acute distress.  Neuro: A&O x3. Answers questions appropriately.  HEENT: Normocephalic, atraumatic. Right conjunctiva non-icteric and non-injected. Left eye surgically absent, covered with eye patch.  Cardiovascular: Regular rate and rhythm, no murmurs. Pulses 2+ in bilateral UE and LE. No peripheral edema Pulmonary: Breathing comfortably on room air. Lungs CTAB without wheezing or crackles. Abdominal: Abdomen soft and non-distended. Normoactive bowel sounds. No tenderness to palpation.  Skin: Warm and dry with no rashes, cuts, or bruises MSK: Spontaneous ROM of all extremities.  Pollyann Savoy, MS4 --------------------------------------------   Pertinent Labs, Studies, and Procedures:     Latest Ref Rng & Units 12/17/2021    1:58 AM 12/16/2021    1:17 AM 12/15/2021    1:07 AM  CBC  WBC 4.0 - 10.5 K/uL 3.1   3.6   6.2    Hemoglobin 13.0 - 17.0 g/dL 13.4   14.2   14.3    Hematocrit 39.0 - 52.0 % 37.7   40.3   42.7    Platelets 150 - 400 K/uL 76   68   51         Latest Ref Rng & Units 12/17/2021    1:58 AM 12/16/2021    1:17 AM 12/15/2021    1:07 AM  CMP  Glucose 70 - 99 mg/dL 167   118   77    BUN 6 - 20  mg/dL '11   8   9    ' Creatinine 0.61 - 1.24 mg/dL 0.59   0.64   0.71    Sodium 135 - 145 mmol/L 136   136   130    Potassium 3.5 - 5.1 mmol/L 3.2   3.1   3.9    Chloride 98 - 111 mmol/L 104   101   96    CO2 22 - 32 mmol/L '24   24   19    ' Calcium 8.9 - 10.3 mg/dL 9.2   9.0   8.7     Lab Results  Component Value Date   HIV1RNAQUANT 42,800 12/14/2021   Signed: Madalyn Rob, MD 12/17/2021, 3:32 PM

## 2021-12-17 NOTE — Progress Notes (Addendum)
Reported by Malena Peer, RN that pt has been refusing IV, provider aware, pt is currently on p.o antibiotics.

## 2021-12-17 NOTE — TOC Progression Note (Addendum)
Transition of Care [facility]) - Progression Note    Patient Details  Name: Derrick Cooper MRN: [id number] Date of Birth: 1960-12-01  [facility] CM/SW Contact  [name], LCSW Phone Number: 12/17/2021, 10:56 AM  Clinical Narrative:    CSW provided patient with shirt and shoes. Bus pass placed on hard chart for dc. Instructions for patient to call [facility] on Monday to complete an intake for their shelter placed on AVS.    Expected Discharge Plan: Home/Self Care Barriers to Discharge: Continued Medical Work up  Expected Discharge Plan and Services Expected Discharge Plan: Home/Self Care In-house Referral: Clinical Social Work Discharge Planning Services: CM Consult Post Acute Care Choice: NA Living arrangements for the past 2 months: Homeless                                       Social Determinants of Health (SDOH) Interventions    Readmission Risk Interventions    12/15/2021    4:16 PM 01/03/2020    9:51 AM  Readmission Risk Prevention Plan  Transportation Screening Complete Complete  Medication Review [facility]) Complete Complete  PCP or Specialist appointment within 3-5 days of discharge Complete Complete  HRI or Home Care Consult Complete Complete  SW Recovery Care/Counseling Consult Complete Complete  Palliative Care Screening Not Applicable Not Applicable  Skilled Nursing Facility Not Applicable Not Applicable

## 2021-12-18 ENCOUNTER — Telehealth: Payer: Self-pay | Admitting: Internal Medicine

## 2021-12-18 NOTE — Telephone Encounter (Signed)
Patient has been off ART. On recent admission, [id number], viral load 42800. Called patient pharmacy and requested Tivicay and Descovy not be given to patient. This was discussed on admission with patient. He has follow up on 5/25 with ID to discuss treatment.

## 2021-12-23 ENCOUNTER — Inpatient Hospital Stay: Payer: Medicaid Other | Admitting: Family

## 2021-12-23 LAB — HIV GENOSURE REFLEX - HIVGTY - ELECTRONIC RECORD

## 2021-12-23 LAB — GENOSURE INTEGRASE HIV EDI: HIV Genosure Integrase PDF 2: 1

## 2021-12-25 LAB — HIV-1 RNA, PCR (GRAPH) RFX/GENO EDI
HIV-1 RNA BY PCR: 35500 copies/mL
HIV-1 RNA Quant, Log: 4.55 log10copy/mL

## 2021-12-25 LAB — HIV GENOSURE(R) MG

## 2021-12-25 LAB — REFLEX TO GENOSURE(R) MG EDI: HIV GenoSure(R): 1

## 2021-12-31 ENCOUNTER — Telehealth: Payer: Self-pay | Admitting: Internal Medicine

## 2021-12-31 NOTE — Telephone Encounter (Signed)
I reviewed his chart again, and his RPR was actually negative. I'm not sure why treponemal antibody got sent, but this +antibody reflects his past syphilis infection, not current infection. No further action needed now.

## 2021-12-31 NOTE — Telephone Encounter (Signed)
I have attempted to reach patient x3 to discuss his +treponemal antibody results. The number he gave [place] takes me to a voicemail for a different person, so I left an HPI-adherent message asking them to please call our clinic, with clinic contact information provided. Patient did not show up to his ID clinic follow-up that we had scheduled for him, and I don't have a current address for him, as he is experiencing homelessness.  If admitted again, please test for syphilis and treat if indicated.

## 2022-02-11 IMAGING — DX DG CHEST 2V
2 series · 2 of 2 positions shown · non-contrast
Comparison: Radiograph 12/10/2019

CLINICAL DATA: Cough and congestion for 2 days

EXAM:
CHEST - 2 VIEW

[chest pa]
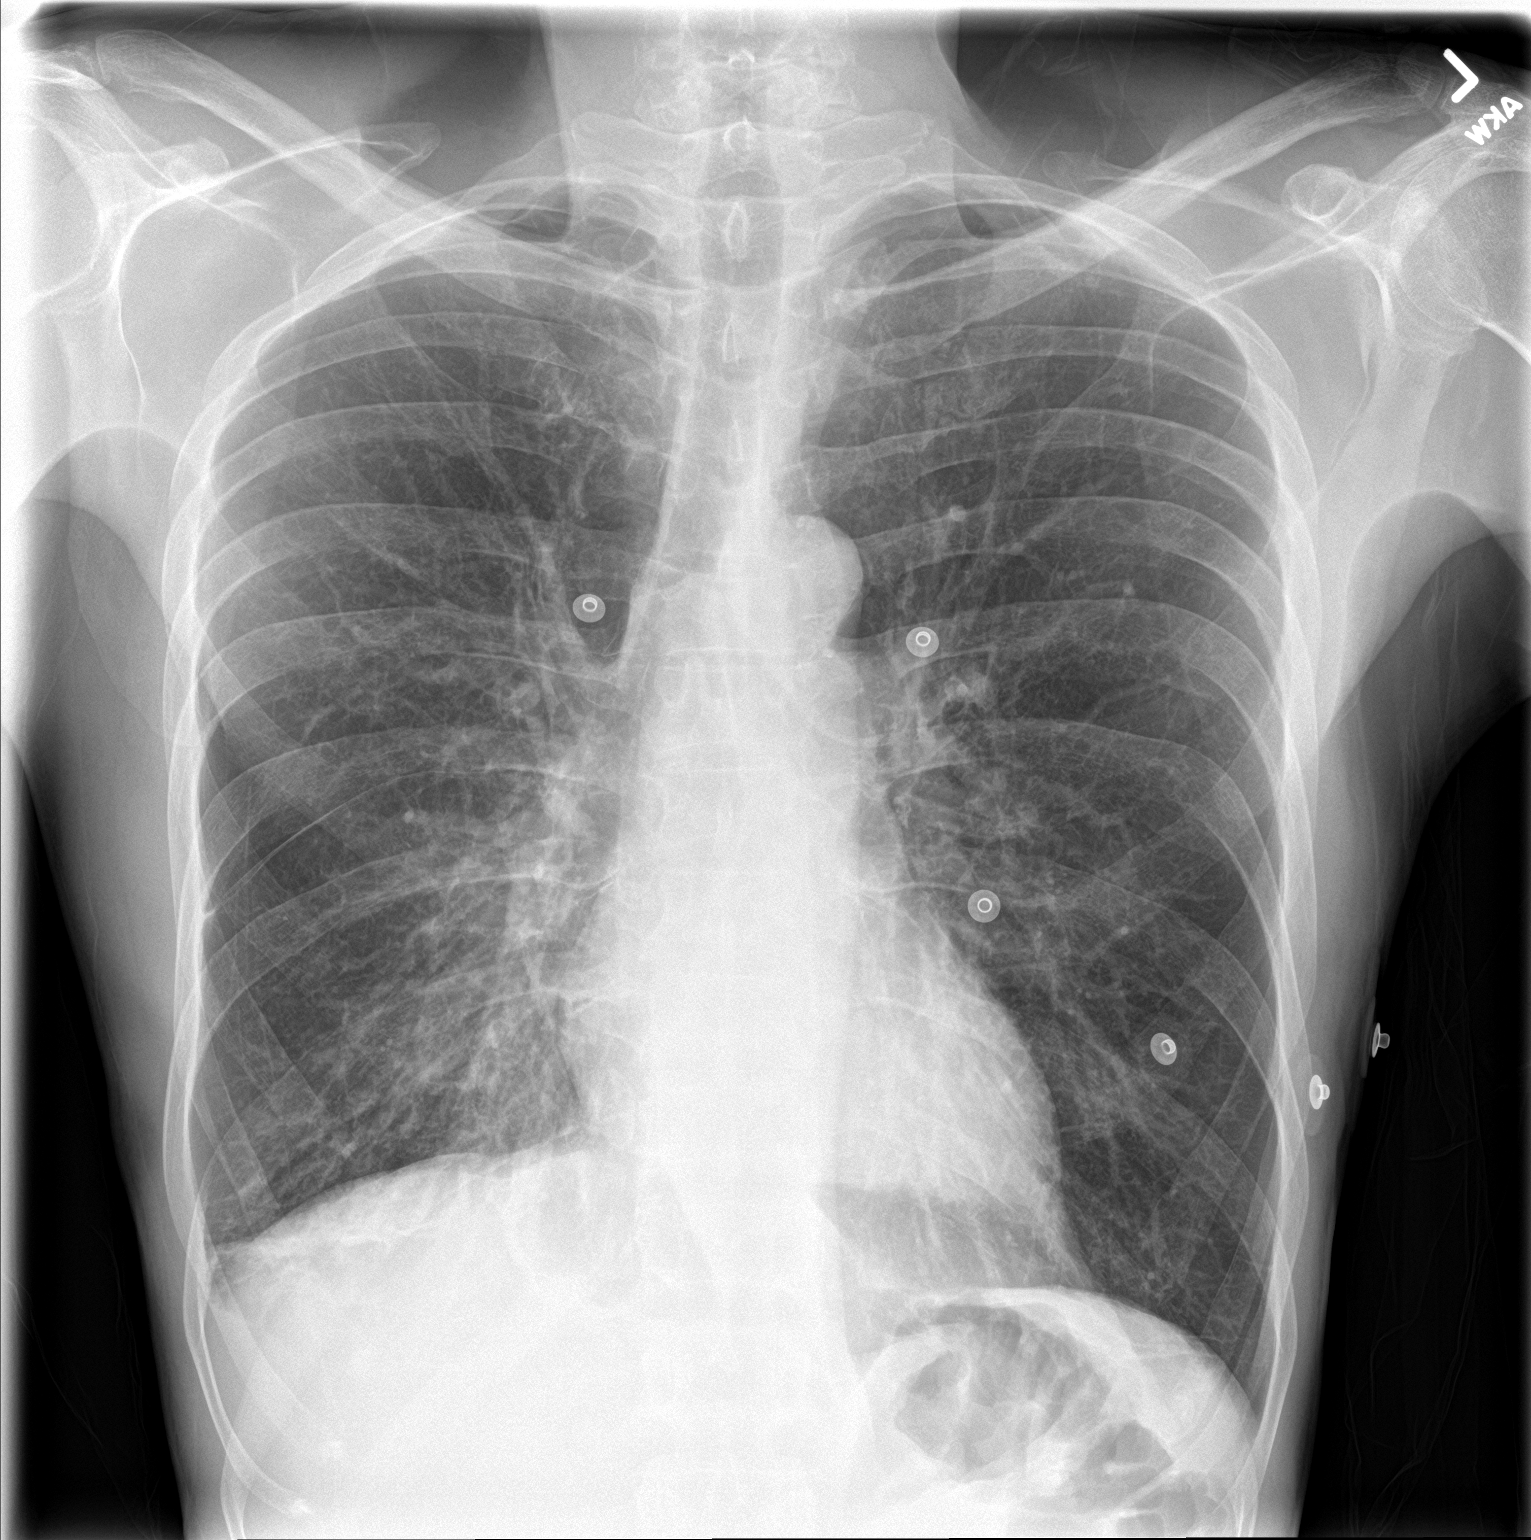

[chest lat]
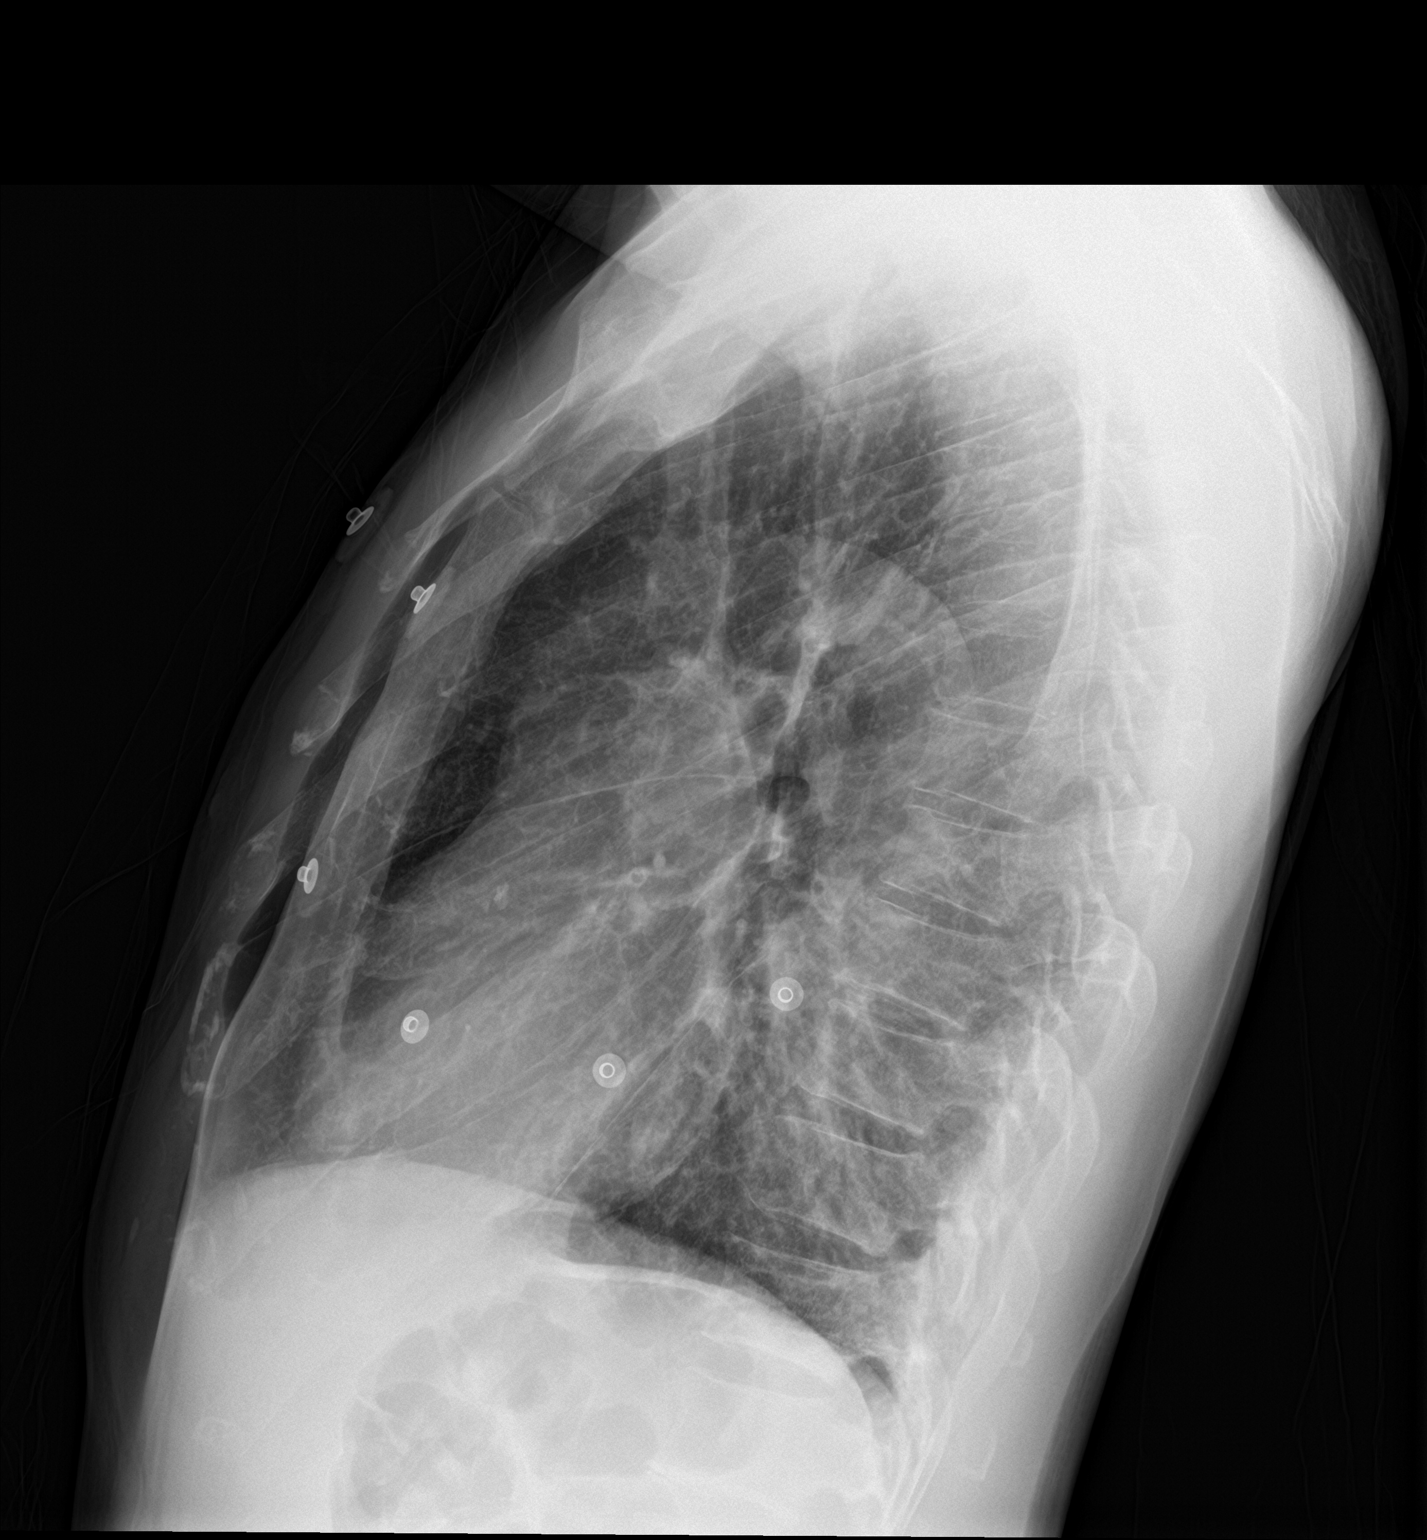

[2 of 2 positions shown; findings below may reference images not displayed]

FINDINGS: There is some asymmetric volume loss in the right hemithorax with
more bandlike opacities in the right lung base and upper lung
favoring subsegmental atelectatic change. More patchy opacity with
airways thickening in the right infrahilar lung is more pronounced
than on comparison study in could reflect developing consolidation.
Left lung is clear. No pneumothorax or visible effusion.
Cardiomediastinal contours are stable from prior. No acute osseous
or soft tissue abnormality.
IMPRESSION: 1. Increasing patchy right infrahilar opacity with airways
thickening, suspicious for developing pneumonia.
2. Right basilar and upper lung subsegmental atelectatic change,
with additional volume loss in the right hemithorax.

## 2022-02-14 ENCOUNTER — Other Ambulatory Visit (HOSPITAL_COMMUNITY): Payer: Self-pay

## 2022-02-15 ENCOUNTER — Other Ambulatory Visit (HOSPITAL_COMMUNITY): Payer: Self-pay

## 2022-06-06 ENCOUNTER — Telehealth: Payer: Self-pay

## 2022-06-06 NOTE — Telephone Encounter (Signed)
Call received from Clair Gulling, [place] at [facility] ED. States patient is currently in care. Requested information on patient's antiviral regimen. All questions answered.  Binnie Kand, RN

## 2022-10-04 ENCOUNTER — Telehealth: Payer: Self-pay

## 2022-10-04 NOTE — Telephone Encounter (Signed)
Received call in triage for ID consult in [place] with [facility]. Dr. Roseanne Reno call to confirm last ART prescribed for patient and when patient was last seen. Last ART prescribed was provided as requested.   Last seen at [facility] in 01/2021  Transferred care to: [place] area   Last visit at [facility] and [place]  Lab:  Lab Results  Component Value Date   HIV1RNAQUANT 42,800 12/14/2021    Lab Results  Component Value Date   CD4TABS 193 (L) 12/13/2021   CD4TABS 175 (L) 02/08/2021   CD4TABS 189 (L) 08/13/2020     Eugenia Mcalpine, LPN

## 2023-03-30 IMAGING — DX DG CHEST 2V
2 series · 2 of 2 positions shown · non-contrast
Comparison: 10/23/2020

CLINICAL DATA: Hyperglycemia, weakness, dizziness

EXAM:
CHEST - 2 VIEW

[chest pa]
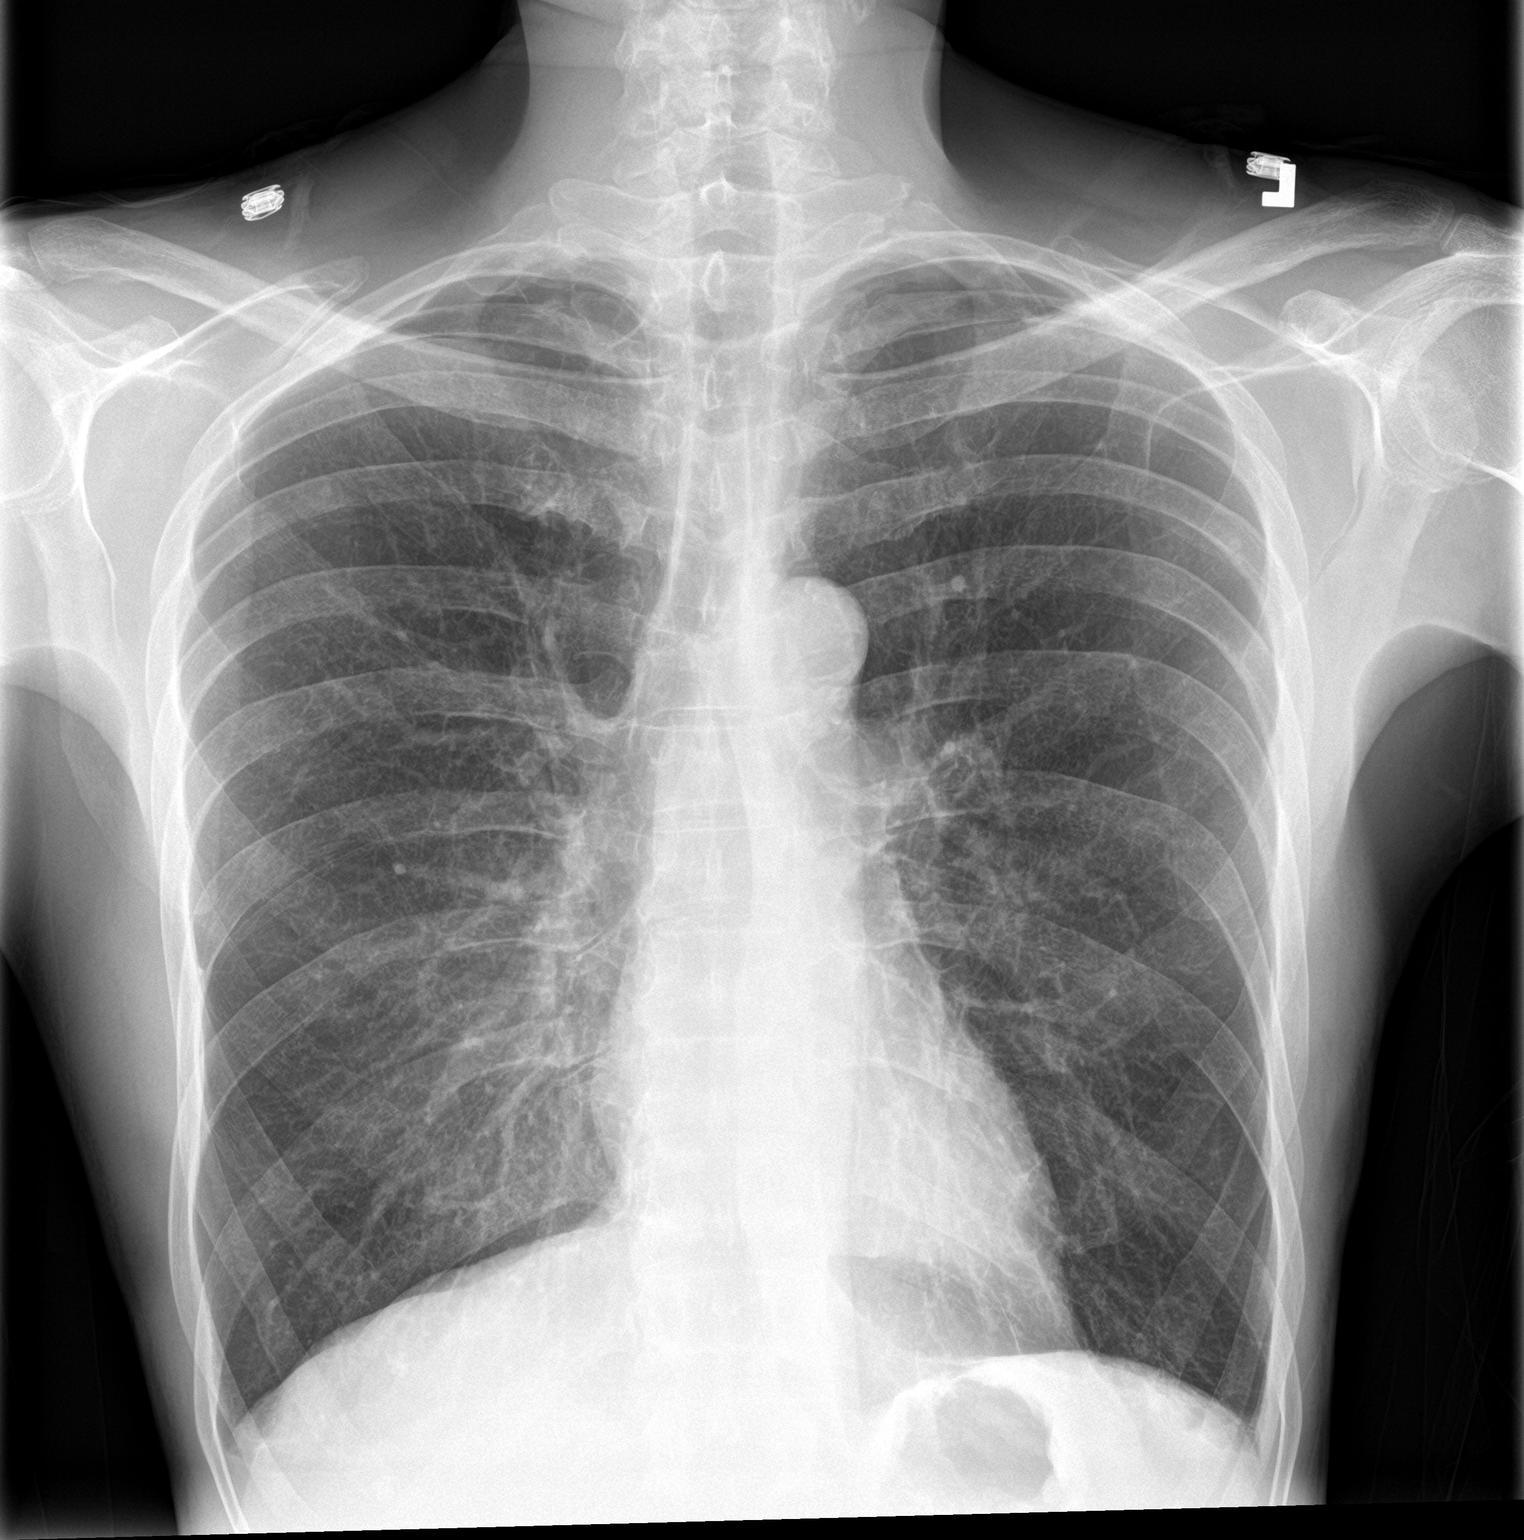

[chest lat]
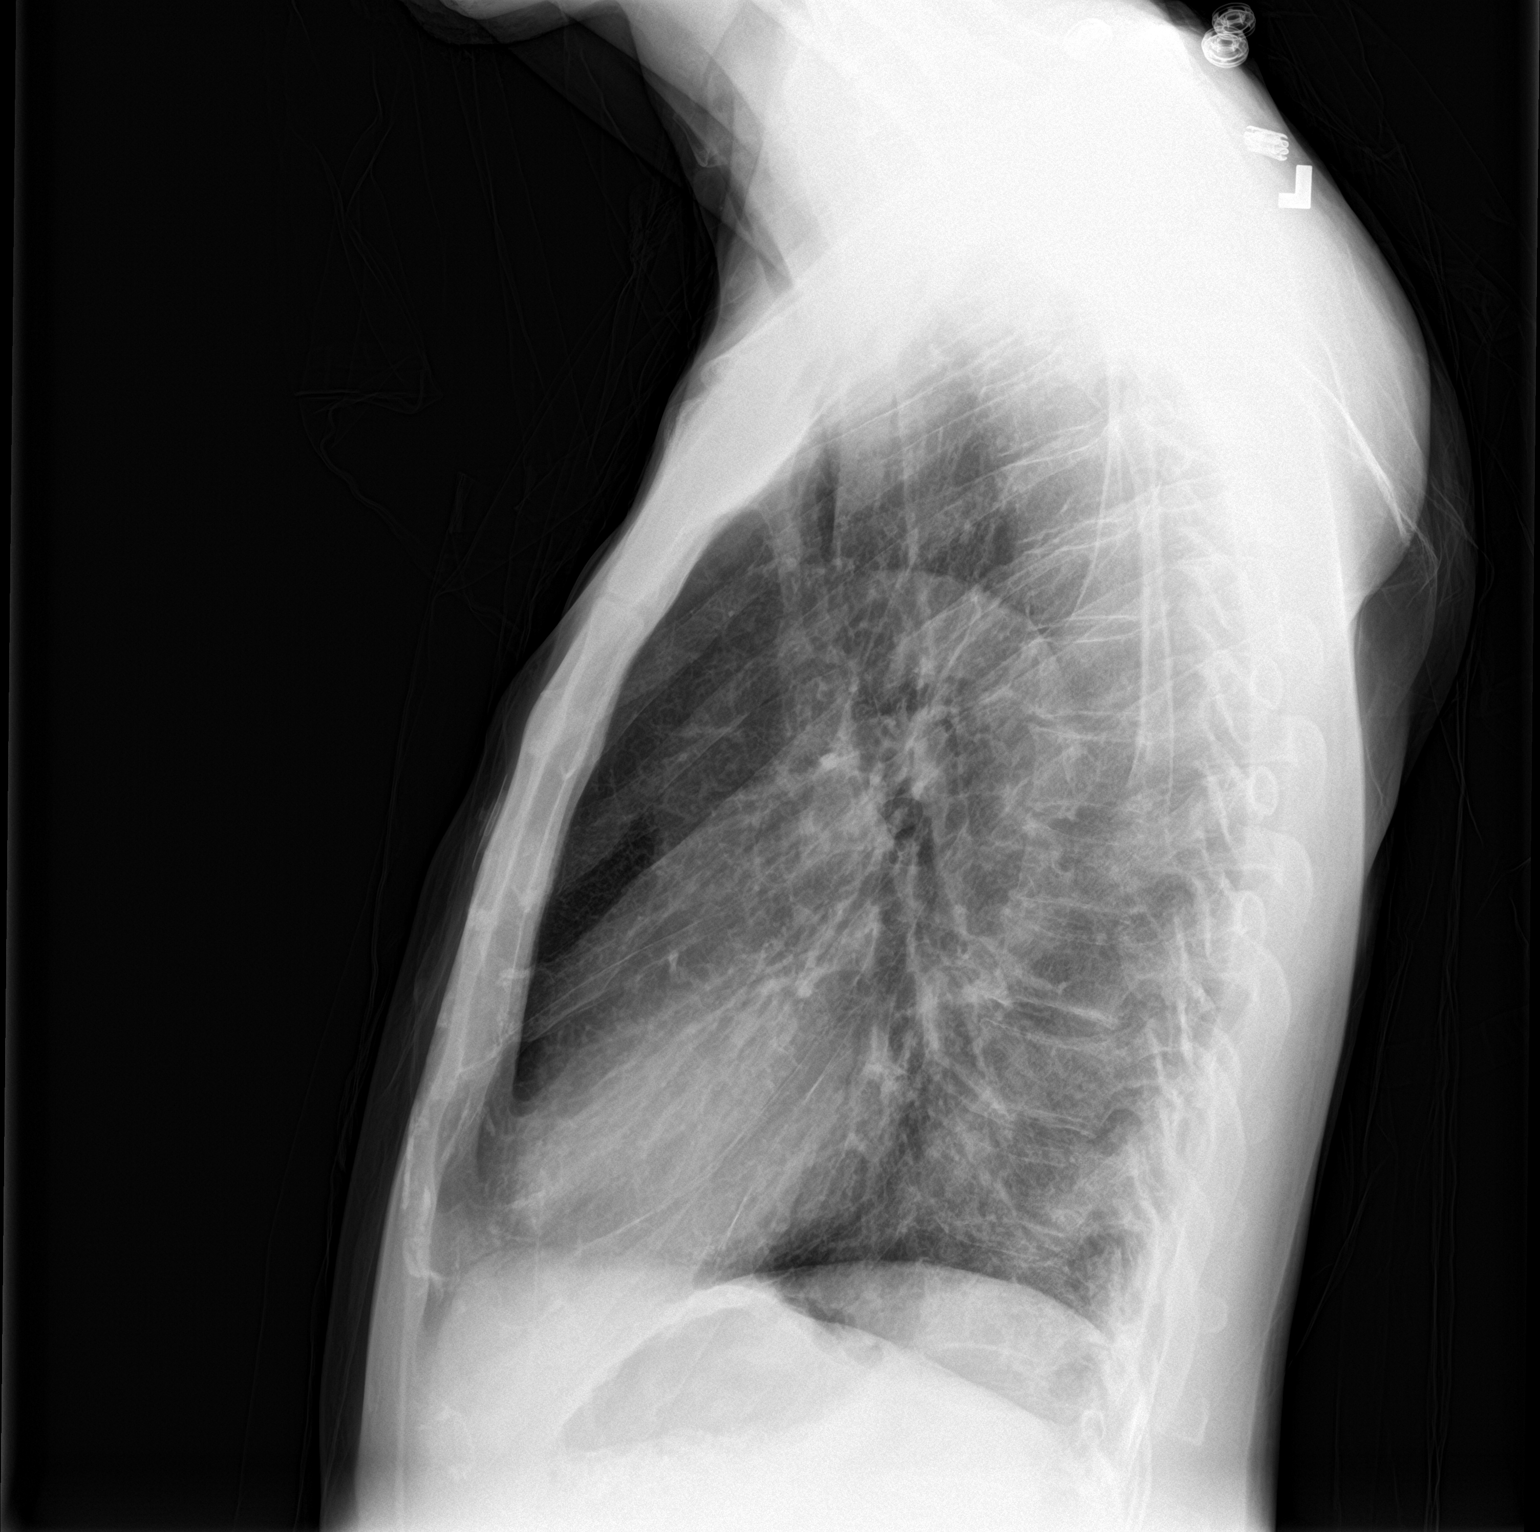

[2 of 2 positions shown; findings below may reference images not displayed]

FINDINGS: The heart size and mediastinal contours are within normal limits.
Both lungs are clear. The visualized skeletal structures are
unremarkable.
IMPRESSION: No acute abnormality of the lungs.
